# Patient Record
Sex: Female | Born: 1955 | Race: White | Hispanic: No | Marital: Married | Smoking: Former smoker
Health system: Southern US, Community
[De-identification: ages and names within clinical notes are randomized; demographics above are authoritative.]

## PROBLEM LIST (undated history)

## (undated) DIAGNOSIS — L409 Psoriasis, unspecified: Secondary | ICD-10-CM

## (undated) DIAGNOSIS — M069 Rheumatoid arthritis, unspecified: Secondary | ICD-10-CM

## (undated) DIAGNOSIS — F419 Anxiety disorder, unspecified: Secondary | ICD-10-CM

## (undated) DIAGNOSIS — D649 Anemia, unspecified: Secondary | ICD-10-CM

## (undated) DIAGNOSIS — D75839 Thrombocytosis, unspecified: Secondary | ICD-10-CM

## (undated) DIAGNOSIS — R42 Dizziness and giddiness: Secondary | ICD-10-CM

## (undated) DIAGNOSIS — K219 Gastro-esophageal reflux disease without esophagitis: Secondary | ICD-10-CM

## (undated) DIAGNOSIS — K5792 Diverticulitis of intestine, part unspecified, without perforation or abscess without bleeding: Secondary | ICD-10-CM

## (undated) DIAGNOSIS — I499 Cardiac arrhythmia, unspecified: Secondary | ICD-10-CM

## (undated) DIAGNOSIS — I10 Essential (primary) hypertension: Secondary | ICD-10-CM

## (undated) DIAGNOSIS — R112 Nausea with vomiting, unspecified: Secondary | ICD-10-CM

## (undated) DIAGNOSIS — I739 Peripheral vascular disease, unspecified: Secondary | ICD-10-CM

## (undated) DIAGNOSIS — E785 Hyperlipidemia, unspecified: Secondary | ICD-10-CM

## (undated) DIAGNOSIS — Z9889 Other specified postprocedural states: Secondary | ICD-10-CM

## (undated) HISTORY — DX: Hyperlipidemia, unspecified: E78.5

## (undated) HISTORY — DX: Rheumatoid arthritis, unspecified: M06.9

## (undated) HISTORY — DX: Psoriasis, unspecified: L40.9

## (undated) HISTORY — DX: Peripheral vascular disease, unspecified: I73.9

## (undated) HISTORY — PX: HYSTERECTOMY ABDOMINAL WITH SALPINGO-OOPHORECTOMY: SHX6792

## (undated) HISTORY — PX: ANEURYSM COILING: SHX5349

## (undated) HISTORY — PX: INSERTION OF MESH: SHX5868

## (undated) HISTORY — PX: CHOLECYSTECTOMY: SHX55

---

## 2003-07-08 HISTORY — PX: CHOLECYSTECTOMY: SHX55

## 2003-07-08 HISTORY — PX: ILEOSTOMY: SHX1783

## 2007-07-13 DIAGNOSIS — F410 Panic disorder [episodic paroxysmal anxiety] without agoraphobia: Secondary | ICD-10-CM | POA: Insufficient documentation

## 2011-05-20 DIAGNOSIS — Z720 Tobacco use: Secondary | ICD-10-CM | POA: Insufficient documentation

## 2011-05-20 DIAGNOSIS — R002 Palpitations: Secondary | ICD-10-CM | POA: Insufficient documentation

## 2013-11-14 DIAGNOSIS — I739 Peripheral vascular disease, unspecified: Secondary | ICD-10-CM | POA: Insufficient documentation

## 2015-03-07 HISTORY — PX: COLONOSCOPY: SHX174

## 2019-04-21 ENCOUNTER — Encounter: Payer: Self-pay | Admitting: Internal Medicine

## 2019-04-30 NOTE — Progress Notes (Signed)
Referring Provider: Berton Bon Primary Care Physician:  Berton Bon Primary Gastroenterologist:  Dr. Gala Romney  Chief Complaint  Patient presents with  . Nausea    doing better    HPI:   Olivia [name] is a 63 y.o. female presenting today at the request of [name], [place], [place] for nausea/vomiting, RUQ pain, and elevated LFTs.  Reviewed recent ED note. Patient was seen at [facility] ED in [place] on 03/13/2019 for further evaluation of abnormal labs after being seen in urgent care for abdominal pain and vomiting that began acutely after eating bacon wrapped scallops and crab dip.  In the ED, alk phos elevated at 267, AST elevated 605, ALT elevated 765, bilirubin elevated 2.9, lipase normal, kidney function and electrolytes within normal limits, WBC elevated at 14, Hgb normal at 13 with normocytic indices, platelets elevated at 673.  CT abdomen and pelvis with IV contrast with heterogenous hepatic enhancement with geographic fatty infiltration, prominence of distal common bile duct without suspicion of obstruction, prior sphincterotomy cholecystectomy with air in the biliary tract, diverticulosis without diverticulitis, and bilateral nephrolithiasis without obstruction.  No acute findings.  Patient received antiemetics and IV fluids and began to feel better.  On repeat exam she had no RUQ pain.  Suspected possible case of hepatitis A.  She was discharged and advised to follow-up with her PCP.  PCP note from 03/21/2019 for hospital follow-up.  Patient continued with nausea but without vomiting.  Denied abdominal pain, diarrhea, bloody stools, fever or hematemesis.  Repeat labs on 03/22/2019 with alk phos elevated at 189, ALT elevated at 67, AST normal, total bilirubin normal.  Hepatitis C antibody negative.  Hepatitis B surface antigen and core antibody IgM negative.  Hepatitis A antibody IgM negative.  WBC elevated at 12.5.  Platelets elevated at 714.  Kidney function and electrolytes  within normal limits.  Today she states after eating bacon wrapped scallops, she developed acute onset n/v and epigastric/RUQabdominal pain. Reports only one episode of vomiting. No hematemesis. No diarrhea. Thinks she had food poisoning. She followed bland diet for about 1 month. States she is doing well now. Abdominal pain has resolved. No nausea or vomiting. Back to eating normally. Had lost about 7 lbs but has gained 2 lbs since September.    Reports occasional blood in the stools, sometimes on toilet tissue.. Thinks she has internal hemorrhoid.  Denies prolapsing tissue, rectal burning, or itching.  Occurs 2-3 times a year. Last episode 8-9 months ago. Present since she had a baby. Last coloscopy about 5 years ago with Dr. Posey Pronto in [place]. Reports 3 polyps. Says her PCP told her she was due. BM daily. No diarrhea or constipation. No melena.   Takes Dexilant as needed for GERD. Taking Dexilant 2-3 times a month at the most. Spicy and greasy foods will trigger symptoms but she tries to avoid these.  Will take [place] ahead of time if she is going to consume known trigger which controlled symptoms well. Very rarely will she have a burning sensation in her chest. No dysphagia.   No fever, chills, lighthededness, dizziness, or feeling like she will pass out. Denies chest pain, heart palitations, or shortness of breath. Mild SOB with exertion. No cough.   No family history of liver disease. Stopped drinking alcohol about 5 years ago. Used to drink 3-4 beers a week. No history of drug use. Reports friend with hep C. Asking if she can get Hep C from her friend. Explained Hep C is transmitted  through blood. She should avoid exposure to others blood and not share personal equipment such as razors and toothbrushes with others. Reports some yellowing of her skin initially. None since. No swelling in abdomen or legs. No confusion, dark urine, or bruising.    Patient brought letter from PCP stating her stool  studies were negative for bacterial infection, ova, or parasites.   Past Medical History:  Diagnosis Date  . Dyslipidemia   . PAD (peripheral artery disease) ([place])   . Psoriasis   . RA (rheumatoid arthritis) ([place])     Past Surgical History:  Procedure Laterality Date  . CHOLECYSTECTOMY    . COLONOSCOPY    . HYSTERECTOMY ABDOMINAL WITH SALPINGO-OOPHORECTOMY    . ILEOSTOMY  2005   per patient for bowel obstruction and peritonitis with anastomosis  . INSERTION OF MESH     RLQ    Current Outpatient Medications  Medication Sig Dispense Refill  . alendronate (FOSAMAX) 70 MG tablet Take 70 mg by mouth once a week. Take with a full glass of water on an empty stomach.    . chlorpheniramine (CHLOR-TRIMETON) 4 MG tablet Take 4 mg by mouth as needed for allergies.    Marland Kitchen clopidogrel (PLAVIX) 75 MG tablet Take 1 tablet by mouth daily.    . Cyanocobalamin (VITAMIN B-12 IJ) Inject as directed. 1045mg weekly    . Cyanocobalamin (VITAMIN B-12) 3000 MCG SUBL Place under the tongue daily.    .Marland KitchenDexlansoprazole 30 MG capsule Take 30 mg by mouth as needed.    . Ergocalciferol (VITAMIN D2 PO) Take by mouth once a week. 1.237m   . ergocalciferol (VITAMIN D2) 1.25 MG (50000 UT) capsule Take 50,000 Units by mouth once a week.    . Glucosamine HCl 1000 MG TABS Take by mouth daily.    . Marland KitchenVER THE COUNTER MEDICATION Calm Five as needed    . OVER THE COUNTER MEDICATION Hemp Oil Complex daily    . OVER THE COUNTER MEDICATION Lean advantage [id number]    . OVER THE COUNTER MEDICATION Spleen desiccated 330m64maily    . Oyster Shell (OYSTER CALCIUM) 500 MG TABS tablet Take 500 mg of elemental calcium by mouth daily.    . predniSONE (DELTASONE) 10 MG tablet Take 10 mg by mouth daily.    . suMarland KitchenfaSALAzine (AZULFIDINE) 500 MG tablet Take 500 mg by mouth 4 (four) times daily.    . TURMERIC PO Take by mouth daily.     No current facility-administered medications for this visit.     Allergies as of 05/02/2019 -  Review Complete 05/02/2019  Allergen Reaction Noted  . Codeine  05/02/2019    Family History  Problem Relation Age of Onset  . Lung cancer Mother   . Lung cancer Father   . Stomach cancer Brother   . Breast cancer Cousin   . Colon cancer Neg Hx     Social History   Socioeconomic History  . Marital status: Unknown    Spouse name: Not on file  . Number of children: Not on file  . Years of education: Not on file  . Highest education level: Not on file  Occupational History  . Not on file  Social Needs  . Financial resource strain: Not on file  . Food insecurity    Worry: Not on file    Inability: Not on file  . Transportation needs    Medical: Not on file    Non-medical: Not on file  Tobacco Use  .  Smoking status: Former Research scientist (life sciences)  . Smokeless tobacco: Never Used  Substance and Sexual Activity  . Alcohol use: Not Currently    Comment: in the past  . Drug use: Never  . Sexual activity: Not on file  Lifestyle  . Physical activity    Days per week: Not on file    Minutes per session: Not on file  . Stress: Not on file  Relationships  . Social Herbalist on phone: Not on file    Gets together: Not on file    Attends religious service: Not on file    Active member of club or organization: Not on file    Attends meetings of clubs or organizations: Not on file    Relationship status: Not on file  . Intimate partner violence    Fear of current or ex partner: Not on file    Emotionally abused: Not on file    Physically abused: Not on file    Forced sexual activity: Not on file  Other Topics Concern  . Not on file  Social History Narrative  . Not on file    Review of Systems: Gen: See HPI CV: See HPI Resp: See HPI.  GI: See HPI GU : Denies urinary burning, urinary frequency, urinary hesitancy MS: Denies joint pain Derm: Denies rash Psych: Admits to anxiety. States she has panic attacks.  Heme: See HPI  Physical Exam: BP 126/67   Pulse 71   Temp  (!) 95.9 F (35.5 C) (Temporal)   Ht _0  (1.6 m)   Wt 155 lb (70.3 kg)   BMI 27.46 kg/m  General:   Alert and oriented. Pleasant and cooperative. Well-nourished and well-developed.  Head:  Normocephalic and atraumatic. Eyes:  Without icterus, sclera clear and conjunctiva pink.  Ears:  Normal auditory acuity. Nose:  No deformity, discharge,  or lesions. Lungs:  Clear to auscultation bilaterally. No wheezes, rales, or rhonchi. No distress.  Heart:  S1, S2 present without murmurs appreciated.  Abdomen:  +BS, soft, non-tender and non-distended. No HSM noted. No guarding or rebound. No masses appreciated.  Rectal:  Deferred  Msk:  Symmetrical without gross deformities. Normal posture. Extremities:  Without edema. Neurologic:  Alert and  oriented x4;  grossly normal neurologically. Skin:  Intact without significant lesions or rashes. Psych: Normal mood and affect.

## 2019-05-02 ENCOUNTER — Other Ambulatory Visit: Payer: Self-pay

## 2019-05-02 ENCOUNTER — Encounter: Payer: Self-pay | Admitting: Gastroenterology

## 2019-05-02 ENCOUNTER — Ambulatory Visit (INDEPENDENT_AMBULATORY_CARE_PROVIDER_SITE_OTHER): Payer: Medicare Other | Admitting: Gastroenterology

## 2019-05-02 VITALS — BP 126/67 | HR 71 | Temp 95.9°F | Ht 63.0 in | Wt 155.0 lb

## 2019-05-02 DIAGNOSIS — R7989 Other specified abnormal findings of blood chemistry: Secondary | ICD-10-CM | POA: Diagnosis not present

## 2019-05-02 DIAGNOSIS — K625 Hemorrhage of anus and rectum: Secondary | ICD-10-CM

## 2019-05-02 DIAGNOSIS — K760 Fatty (change of) liver, not elsewhere classified: Secondary | ICD-10-CM | POA: Diagnosis not present

## 2019-05-02 NOTE — Assessment & Plan Note (Addendum)
Fatty liver seen on recent CT abdomen and pelvis on 03/13/19 while she was being evaluated for acute onset upper abdominal pain, nausea, and vomiting with elevated LFTs. LFTs are down trending and patient is asymptomatic at this time. Reports an episode of scleral icterus initially, but this has resolved and she is without any signs or symptoms of advanced liver disease. No alcohol in 5 years. Used to drink 4 beers a week. No history of drug use. Hepatitis panel negative on 03/22/19. She does take several OTC supplements.   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  Rechecking CBC and HFP to ensure LFTs continue to normalize.  Instructions for fatty liver: Recommend 1-2# weight loss per week until ideal body weight through exercise & diet. Low fat/cholesterol diet.   Avoid sweets, sodas, fruit juices, sweetened beverages like tea, etc. Gradually increase exercise from 15 min daily up to 1 hr per day 5 days/week. Limit alcohol use. Advised she discontinue OTC supplements.

## 2019-05-02 NOTE — Patient Instructions (Addendum)
Please have labs completed.  We will call you with results and further recommendations.  You do have a fatty liver that was identified on your recent CT scan. Instructions for fatty liver: Recommend 1-2# weight loss per week until ideal body weight through exercise & diet. Low fat/cholesterol diet.   Avoid sweets, sodas, fruit juices, sweetened beverages like tea, etc. Gradually increase exercise from 15 min daily up to 1 hr per day 5 days/week. Limit alcohol use.  I would advise that you discontinue your over-the-counter supplements.  I am requesting your colonoscopy records.  Once I receive the results, I can determine whether you are due for updated colonoscopy at this time.  We will reach out to you to get you scheduled if need be.  Aliene Altes, PA-C [facility]   Fatty Liver Disease Diet, Adult Fatty liver disease is a condition that causes fat to build up in and around the liver. The disease makes it harder for the liver to work the way that it should. Following a healthy diet can help to keep fatty liver disease under control. It can also help to prevent or improve conditions that are associated with the disease, such as heart disease, diabetes, high blood pressure, and abnormal cholesterol levels. Along with regular exercise, this diet:  Promotes weight loss.  Helps to control blood sugar levels.  Helps to improve the way that the body uses insulin. What are tips for following this plan? Reading food labels Always check food labels for:  The amount of saturated fat in a food. You should limit your intake of saturated fat. Saturated fat is found in foods that come from animals, including meat and dairy products such as butter, cheese, and whole milk.  The amount of fiber in a food. You should choose high-fiber foods such as fruits, vegetables, and whole grains. Try to get 25-30 grams (g) of fiber a day.  Cooking  When cooking, use heart-healthy oils that  are high in monounsaturated fats. These include olive oil, canola oil, and avocado oil.  Limit frying or deep-frying foods. Cook foods using healthy methods such as baking, boiling, steaming, and grilling instead. Meal planning  You may want to keep track of how many calories you take in. Eating the right amount of calories will help you achieve a healthy weight. Meeting with a registered dietitian can help you get started.  Limit how often you eat takeout and fast food. These foods are usually very high in fat, salt, and sugar.  Use the glycemic index (GI) to plan your meals. The index tells you how quickly a food will raise your blood sugar. Choose low-GI foods (GI less than 55). These foods take a longer time to raise blood sugar. A registered dietitian can help you identify foods lower on the GI scale. Lifestyle  You may want to follow a Mediterranean diet. This diet includes a lot of vegetables, lean meats or fish, whole grains, fruits, and healthy oils and fats. What foods can I eat?  Fruits Bananas. Apples. Oranges. Grapes. Papaya. Mango. Pomegranate. Kiwi. Grapefruit. Cherries. Vegetables Lettuce. Spinach. Peas. Beets. Cauliflower. Cabbage. Broccoli. Carrots. Tomatoes. Squash. Eggplant. Herbs. Peppers. Onions. Cucumbers. Brussels sprouts. Yams and sweet potatoes. Beans. Lentils. Grains Whole wheat or whole-grain foods, including breads, crackers, cereals, and pasta. Stone-ground whole wheat. Unsweetened oatmeal. Bulgur. Barley. Quinoa. Brown or wild rice. Corn or whole wheat flour tortillas. Meats and other proteins Lean meats. Poultry. Tofu. Seafood and shellfish. Dairy Low-fat or fat-free dairy products,  such as yogurt, cottage cheese, or cheese. Beverages Water. Sugar-free drinks. Tea. Coffee. Low-fat or skim milk. Milk alternatives, such as soy or almond milk. Real fruit juice. Fats and oils Avocado. Canola or olive oil. Nuts and nut butters. Seeds. Seasonings and  condiments Mustard. Relish. Low-fat, low-sugar ketchup and barbecue sauce. Low-fat or fat-free mayonnaise. Sweets and desserts Sugar-free sweets. The items listed above may not be a complete list of foods and beverages you can eat. Contact a dietitian for more information. What foods should I limit or avoid? Meats and other proteins Limit red meat to 1-2 times a week. Dairy [employer]. Fats and oils Palm oil and coconut oil. Fried foods. Other foods Processed foods. Foods that contain a lot of salt or sodium. Sweets and desserts Sweets that contain sugar. Beverages Sweetened drinks, such as sweet tea, milkshakes, iced sweet drinks, and sodas. Alcohol. The items listed above may not be a complete list of foods and beverages you should avoid. Contact a dietitian for more information. Where to find more information The [employer] of Diabetes and Digestive and Kidney Diseases: [employer] Summary  Nonalcoholic fatty liver disease is a condition that causes fat to build up in and around the liver.  Following a healthy diet can help to keep nonalcoholic fatty liver disease under control. Your diet should be rich in fruits, vegetables, whole grains, and lean proteins.  Limit your intake of saturated fat. Saturated fat is found in foods that come from animals, including meat and dairy products such as butter, cheese, and whole milk.  This diet promotes weight loss, helps to control blood sugar levels, and helps to improve the way that the body uses insulin. This information is not intended to replace advice given to you by your health care provider. Make sure you discuss any questions you have with your health care provider. Document Released: 11/07/2014 Document Revised: 10/15/2018 Document Reviewed: 07/15/2018 Elsevier Patient Education  2020 [employer].

## 2019-05-02 NOTE — Assessment & Plan Note (Signed)
Patient reports history of intermittent rectal bleeding since giving birth.  Will have bright red blood in stool and on toilet tissue about 2-3 times a year.  Last episode about 8-9 months ago.  BMs daily without constipation or diarrhea.  Thinks she may have internal hemorrhoids.  Denies prolapsing tissue, rectal burning, or itching.  Last colonoscopy with Dr. Posey Pronto in [place] about 5 years ago.  Per patient she had 3 polyps.  Thinks she may be due for repeat colonoscopy at this time.  We will request TCS records. Further recommendations to follow.

## 2019-05-02 NOTE — Assessment & Plan Note (Addendum)
63 year old female who is s/p cholecystectomy who presents for follow-up of recent acute elevation of LFTs.  Patient presented to [facility] ED on 03/13/2019 with acute onset nausea, vomiting, and upper abdominal pain.  Labs revealed significant elevation of LFTs with alk phos 267, AST 605, and ALT 767, bilirubin elevated at 2.9.  WBC elevated at 14, platelets elevated at 673. CT abdomen and pelvis significant for fatty infiltration of the liver, prominence of distal common bile duct without suspicion of obstruction s/p cholecystectomy and prior sphincterotomy with air in the biliary tract.  She improved with antiemetics and fluids. Repeat labs on 03/22/2019 with significant improvement in LFTs with alk phos 189, ALT 67, AST normal, total bilirubin normal.  Hep C antibody negative, hep B surface antigen and core antibody IgM negative, hepatitis A antibody IgM negative.  WBC slightly elevated 12.5, platelets elevated 714.  Patient brought letter from PCP stating her stool studies were negative for bacterial infection, ova, or parasites. Today patient is asymptomatic.  She has resumed a normal diet and has gained 2 pounds in September.  No significant upper GI symptoms.  Denies ever having diarrhea with acute illness. Reports an episode of scleral icterus initially, but this has resolved and she is without any signs or symptoms of advanced liver disease. Occasional blood in her stools about 2-3 times a year since giving birth.  Last episode 8-9 months ago.  Otherwise, no significant lower GI symptoms.  Last colonoscopy about 5 years ago with Dr. Posey Pronto.  Reports 3 polyps. No family history of liver disease. No alcohol in 5 years. No history of drug use. She does take several OTC supplements.   I suspect patients elevated LFTs were related to acute, although undiagnosed, illness as symptoms occurred acutely after eating seafood and have completely resolved. Seems most consistent with Hep A although IgM was negative. It is  possible she passed a CBD stone, but I am less suspicious of this. I will recheck her CBC and HFP today to ensure her LFTs are continuing to trend down.  Instructions for fatty liver: Recommend 1-2# weight loss per week until ideal body weight through exercise & diet. Low fat/cholesterol diet.   Avoid sweets, sodas, fruit juices, sweetened beverages like tea, etc. Gradually increase exercise from 15 min daily up to 1 hr per day 5 days/week. Limit alcohol use. Fatty liver handout provided.  Advised to d/c OTC supplements.  Request TCS report from Dr. Posey Pronto.

## 2019-05-03 LAB — HEPATIC FUNCTION PANEL
AG Ratio: 1.6 (calc) (ref 1.0–2.5)
ALT: 21 U/L (ref 6–29)
AST: 20 U/L (ref 10–35)
Albumin: 4.1 g/dL (ref 3.6–5.1)
Alkaline phosphatase (APISO): 96 U/L (ref 37–153)
Bilirubin, Direct: 0.1 mg/dL (ref 0.0–0.2)
Globulin: 2.6 g/dL (calc) (ref 1.9–3.7)
Indirect Bilirubin: 0.2 mg/dL (calc) (ref 0.2–1.2)
Total Bilirubin: 0.3 mg/dL (ref 0.2–1.2)
Total Protein: 6.7 g/dL (ref 6.1–8.1)

## 2019-05-03 LAB — CBC WITH DIFFERENTIAL/PLATELET
Absolute Monocytes: 930 cells/uL (ref 200–950)
Basophils Absolute: 66 cells/uL (ref 0–200)
Basophils Relative: 0.4 %
Eosinophils Absolute: 0 cells/uL — ABNORMAL LOW (ref 15–500)
Eosinophils Relative: 0 %
HCT: 39 % (ref 35.0–45.0)
Hemoglobin: 12.7 g/dL (ref 11.7–15.5)
Lymphs Abs: 3303 cells/uL (ref 850–3900)
MCH: 27.7 pg (ref 27.0–33.0)
MCHC: 32.6 g/dL (ref 32.0–36.0)
MCV: 85 fL (ref 80.0–100.0)
MPV: 8.7 fL (ref 7.5–12.5)
Monocytes Relative: 5.6 %
Neutro Abs: 12301 cells/uL — ABNORMAL HIGH (ref 1500–7800)
Neutrophils Relative %: 74.1 %
Platelets: 676 10*3/uL — ABNORMAL HIGH (ref 140–400)
RBC: 4.59 10*6/uL (ref 3.80–5.10)
RDW: 13.4 % (ref 11.0–15.0)
Total Lymphocyte: 19.9 %
WBC: 16.6 10*3/uL — ABNORMAL HIGH (ref 3.8–10.8)

## 2019-05-03 LAB — HEPATITIS A ANTIBODY, TOTAL: Hepatitis A AB,Total: NONREACTIVE

## 2019-05-04 NOTE — Progress Notes (Signed)
LFTs are back within normal limits. Hep A Ab total non-reactive. CBC with elevated WBC and elevated platelets. In review of her chart, there is mention of chronically elevated white count with evaluation in the past at [facility]. I am not able to see the workup. This was mentioned in a cardiology note. Has she been worked up for elevated white count in the past? Any fever, chills, cough, shortness of breath, abdominal pain, or urinary symptoms?

## 2019-05-19 ENCOUNTER — Encounter: Payer: Self-pay | Admitting: Gastroenterology

## 2019-05-29 ENCOUNTER — Telehealth: Payer: Self-pay | Admitting: Gastroenterology

## 2019-05-29 NOTE — Telephone Encounter (Signed)
Received patient's colonoscopy report from [facility] gastroenterology with Dr. Posey Pronto dated 03/07/2015.  Propofol was used for sedation.  Findings included 3 6 mm rectosigmoid polyps, moderate left-sided diverticulosis, and tortuous rectosigmoid colon.  Pathology with inflamed hyperplastic polyps.  Discussed case with Dr. Gala Romney. In light of intermittent rectal bleeding, he recommended we go ahead and update colonoscopy at this time as she is now seeing [place] and last colonoscopy was performed at an outside facility.  Although rectal bleeding may be from a benign source such as hemorrhoids, would not want to potentially miss something significant.   Elmo Putt, please let patient know we would like to go ahead and schedule her for colonoscopy. We can follow-up with her in the office after her procedure.   [facility] Clinical pool: Pending patients agreement to move forward, we need to schedule patient for TCS with propofol with Dr. Gala Romney. Dx. Rectal bleeding.

## 2019-05-30 ENCOUNTER — Encounter: Payer: Self-pay | Admitting: *Deleted

## 2019-05-30 ENCOUNTER — Telehealth: Payer: Self-pay | Admitting: Internal Medicine

## 2019-05-30 ENCOUNTER — Other Ambulatory Visit: Payer: Self-pay

## 2019-05-30 DIAGNOSIS — K625 Hemorrhage of anus and rectum: Secondary | ICD-10-CM

## 2019-05-30 MED ORDER — PEG 3350-KCL-NA BICARB-NACL 420 G PO SOLR: 4000.0000 mL | ORAL | 0 refills | Status: DC

## 2019-05-30 NOTE — Telephone Encounter (Signed)
Called pt, TCS w/RMR w/Propofol scheduled for 08/18/19 at 2:15pm. Rx for prep sent to pharmacy. Orders entered.

## 2019-05-30 NOTE — Telephone Encounter (Signed)
Pt notified that her records were reviewed by [facility] and is ready to schedule her procedure.

## 2019-05-30 NOTE — Telephone Encounter (Signed)
Pt called to see if we ever received records from [facility], I told her yes, they were with [facility]. She is asking to go ahead and schedule her colonoscopy. [phone]

## 2019-05-31 NOTE — Telephone Encounter (Signed)
Pre-op and COVID test 08/16/19. Appt letter mailed with procedure instructions.

## 2019-07-20 ENCOUNTER — Telehealth: Payer: Self-pay | Admitting: Internal Medicine

## 2019-07-20 NOTE — Telephone Encounter (Signed)
Pt is scheduled colonoscopy on 08/18/2019. She is having pain and discomfort in her liver and was asking if she should have her enzymes checked. Please advise and call her at [phone]

## 2019-07-20 NOTE — Telephone Encounter (Signed)
Spoke with pt. Pt started to feel some swelling and tightness around her upper abdomen 2 days ago. Pt states she measured her upper abdomen and it's 42 inches. Sharp pain comes and goes and only last for seconds. Pt states the pain wasn't severe. Mild nausea has been present. Pt is wondering if her liver enzymes were elevated and if she should have her blood checked.

## 2019-07-21 NOTE — Telephone Encounter (Signed)
Noted  

## 2019-07-21 NOTE — Telephone Encounter (Signed)
Spoke with pt. Pt was notified of Kh's recommendations. Pt isn't taking Dexilant at all and is aware that per [facility], she can start taking it daily to see if it helps symptoms. Pt was asked to schedule an apt for further evaluation or proceed to the ED if her symptoms worsen. Pt declined apt and says she is going to call her PCP since she lives in [place]. If she isn't able to schedule with her PCP, she will call our office back.

## 2019-07-21 NOTE — Telephone Encounter (Signed)
I would not expect her LFTs to be elevated. LFTs were elevated in September 2020 related to an acute illness after eating seafood. They had returned to normal in October 2020. I would not recheck LFTs based off of these symptoms alone. How are her reflux symptoms? Is she still taking Dexilant? At last visit, she was taking this as needed. She can try taking this daily to see if this helps. Otherwise, if her symptoms persist, she would need to be seen in the office for further evaluation. If her symptoms acutely worsen/become severe, she should proceed to the ED.

## 2019-08-15 ENCOUNTER — Telehealth: Payer: Self-pay | Admitting: Internal Medicine

## 2019-08-15 ENCOUNTER — Other Ambulatory Visit: Payer: Self-pay

## 2019-08-15 ENCOUNTER — Encounter (HOSPITAL_COMMUNITY): Payer: Self-pay

## 2019-08-15 NOTE — Telephone Encounter (Signed)
Pt has multiple questions about her prep and the otc stool softener. Please call her at [phone] She is scheduled for this Thursday.

## 2019-08-15 NOTE — Telephone Encounter (Signed)
Called pt, answered questions.

## 2019-08-16 ENCOUNTER — Other Ambulatory Visit (HOSPITAL_COMMUNITY)
Admission: RE | Admit: 2019-08-16 | Discharge: 2019-08-16 | Disposition: A | Discharge status: Home / Self Care | Payer: Medicare Other | Source: Ambulatory Visit | Attending: Internal Medicine | Admitting: Internal Medicine

## 2019-08-16 ENCOUNTER — Encounter (HOSPITAL_COMMUNITY)
Admission: RE | Admit: 2019-08-16 | Discharge: 2019-08-16 | Disposition: A | Discharge status: Home / Self Care | Payer: Medicare Other | Source: Ambulatory Visit | Attending: Internal Medicine | Admitting: Internal Medicine

## 2019-08-16 DIAGNOSIS — Z01812 Encounter for preprocedural laboratory examination: Secondary | ICD-10-CM | POA: Insufficient documentation

## 2019-08-16 DIAGNOSIS — Z20822 Contact with and (suspected) exposure to covid-19: Secondary | ICD-10-CM | POA: Diagnosis not present

## 2019-08-16 HISTORY — DX: Dizziness and giddiness: R42

## 2019-08-16 HISTORY — DX: Other specified postprocedural states: Z98.890

## 2019-08-16 HISTORY — DX: Nausea with vomiting, unspecified: R11.2

## 2019-08-16 LAB — SARS CORONAVIRUS 2 (TAT 6-24 HRS): SARS Coronavirus 2: NEGATIVE

## 2019-08-17 ENCOUNTER — Telehealth: Payer: Self-pay

## 2019-08-17 NOTE — Telephone Encounter (Signed)
Tried to call pt to see if she can arrive earlier tomorrow for TCS, no answer, LMOVM for return call.

## 2019-08-17 NOTE — Telephone Encounter (Signed)
Patient called office, she doesn't want to have TCS any sooner tomorrow. She wants to keep time as scheduled.

## 2019-08-18 ENCOUNTER — Other Ambulatory Visit: Payer: Self-pay

## 2019-08-18 ENCOUNTER — Ambulatory Visit (HOSPITAL_COMMUNITY): Payer: Medicare Other | Admitting: Anesthesiology

## 2019-08-18 ENCOUNTER — Encounter (HOSPITAL_COMMUNITY): Payer: Self-pay | Admitting: Internal Medicine

## 2019-08-18 ENCOUNTER — Ambulatory Visit (HOSPITAL_COMMUNITY)
Admission: RE | Admit: 2019-08-18 | Discharge: 2019-08-18 | Disposition: A | Discharge status: Home / Self Care | Payer: Medicare Other | Attending: Internal Medicine | Admitting: Internal Medicine

## 2019-08-18 ENCOUNTER — Encounter (HOSPITAL_COMMUNITY)
Admission: RE | Disposition: A | Discharge status: Home / Self Care | Payer: Self-pay | Source: Home / Self Care | Attending: Internal Medicine

## 2019-08-18 DIAGNOSIS — Z87891 Personal history of nicotine dependence: Secondary | ICD-10-CM | POA: Diagnosis not present

## 2019-08-18 DIAGNOSIS — Z7983 Long term (current) use of bisphosphonates: Secondary | ICD-10-CM | POA: Insufficient documentation

## 2019-08-18 DIAGNOSIS — K640 First degree hemorrhoids: Secondary | ICD-10-CM | POA: Diagnosis not present

## 2019-08-18 DIAGNOSIS — Z79899 Other long term (current) drug therapy: Secondary | ICD-10-CM | POA: Diagnosis not present

## 2019-08-18 DIAGNOSIS — K5731 Diverticulosis of large intestine without perforation or abscess with bleeding: Secondary | ICD-10-CM | POA: Diagnosis not present

## 2019-08-18 DIAGNOSIS — Z7902 Long term (current) use of antithrombotics/antiplatelets: Secondary | ICD-10-CM | POA: Diagnosis not present

## 2019-08-18 DIAGNOSIS — I739 Peripheral vascular disease, unspecified: Secondary | ICD-10-CM | POA: Insufficient documentation

## 2019-08-18 DIAGNOSIS — E785 Hyperlipidemia, unspecified: Secondary | ICD-10-CM | POA: Insufficient documentation

## 2019-08-18 DIAGNOSIS — K219 Gastro-esophageal reflux disease without esophagitis: Secondary | ICD-10-CM | POA: Diagnosis not present

## 2019-08-18 DIAGNOSIS — K921 Melena: Secondary | ICD-10-CM | POA: Diagnosis present

## 2019-08-18 DIAGNOSIS — K621 Rectal polyp: Secondary | ICD-10-CM | POA: Diagnosis not present

## 2019-08-18 DIAGNOSIS — D125 Benign neoplasm of sigmoid colon: Secondary | ICD-10-CM | POA: Diagnosis not present

## 2019-08-18 DIAGNOSIS — K625 Hemorrhage of anus and rectum: Secondary | ICD-10-CM

## 2019-08-18 DIAGNOSIS — K635 Polyp of colon: Secondary | ICD-10-CM

## 2019-08-18 HISTORY — PX: COLONOSCOPY WITH PROPOFOL: SHX5780

## 2019-08-18 HISTORY — PX: POLYPECTOMY: SHX5525

## 2019-08-18 SURGERY — COLONOSCOPY WITH PROPOFOL
Anesthesia: General

## 2019-08-18 MED ORDER — STERILE WATER FOR IRRIGATION IR SOLN
Status: DC | PRN
  Administered 2019-08-18: 1.5 mL

## 2019-08-18 MED ORDER — CHLORHEXIDINE GLUCONATE CLOTH 2 % EX PADS: 6.0000 | MEDICATED_PAD | Freq: Once | CUTANEOUS | Status: DC

## 2019-08-18 MED ORDER — MIDAZOLAM HCL 2 MG/2ML IJ SOLN
INTRAMUSCULAR | Status: AC
  Filled 2019-08-18: qty 2

## 2019-08-18 MED ORDER — PROPOFOL 10 MG/ML IV BOLUS
INTRAVENOUS | Status: DC | PRN
  Administered 2019-08-18 (×2): 20 mg via INTRAVENOUS
  Administered 2019-08-18: 40 mg via INTRAVENOUS
  Administered 2019-08-18 (×2): 20 mg via INTRAVENOUS

## 2019-08-18 MED ORDER — LACTATED RINGERS IV SOLN
Freq: Once | INTRAVENOUS | Status: AC
  Administered 2019-08-18: 13:00:00 1000 mL via INTRAVENOUS

## 2019-08-18 MED ORDER — PROPOFOL 500 MG/50ML IV EMUL
INTRAVENOUS | Status: DC | PRN
  Administered 2019-08-18: 150 ug/kg/min via INTRAVENOUS

## 2019-08-18 NOTE — Anesthesia Postprocedure Evaluation (Signed)
Anesthesia Post Note  Patient: Olivia [name]  Procedure(s) Performed: COLONOSCOPY WITH PROPOFOL (N/A ) POLYPECTOMY  Patient location during evaluation: PACU Anesthesia Type: General Level of consciousness: awake and alert and oriented Pain management: pain level controlled Vital Signs Assessment: post-procedure vital signs reviewed and stable Respiratory status: spontaneous breathing Cardiovascular status: blood pressure returned to baseline and stable Postop Assessment: no apparent nausea or vomiting Anesthetic complications: no     Last Vitals:  Vitals:   08/18/19 1231 08/18/19 1350  BP: 137/70 (!) 106/55  Pulse: 86 85  Resp: 18 17  Temp: 36.8 C 36.8 C  SpO2: 98% 97%    Last Pain:  Vitals:   08/18/19 1350  TempSrc:   PainSc: 0-No pain                 Shiah Berhow

## 2019-08-18 NOTE — Op Note (Signed)
[facility] Patient Name: Olivia [name] Procedure Date: 08/18/2019 12:42 PM MRN: [id number] Date of Birth: October 26, 1955 Attending MD: Norvel Richards , MD CSN: [id number] Age: 64 Admit Type: Outpatient Procedure:                Colonoscopy Indications:              Hematochezia Providers:                Norvel Richards, MD, Charlsie Quest. Theda Sers RN, RN,                            Aram Candela Referring MD:              Medicines:                Propofol per Anesthesia Complications:            No immediate complications. Estimated Blood Loss:     Estimated blood loss was minimal. Procedure:                Pre-Anesthesia Assessment:                           - Prior to the procedure, a History and Physical                            was performed, and patient medications and                            allergies were reviewed. The patient's tolerance of                            previous anesthesia was also reviewed. The risks                            and benefits of the procedure and the sedation                            options and risks were discussed with the patient.                            All questions were answered, and informed consent                            was obtained. Prior Anticoagulants: The patient has                            taken no previous anticoagulant or antiplatelet                            agents. ASA Grade Assessment: II - A patient with                            mild systemic disease. After reviewing the risks  and benefits, the patient was deemed in                            satisfactory condition to undergo the procedure.                           After obtaining informed consent, the colonoscope                            was passed under direct vision. Throughout the                            procedure, the patient's blood pressure, pulse, and                            oxygen saturations were monitored  continuously. The                            [id number] scope was introduced through                            the anus and advanced to the the cecum, identified                            by appendiceal orifice and ileocecal valve. The                            colonoscopy was performed without difficulty. The                            patient tolerated the procedure well. The quality                            of the bowel preparation was adequate. The                            ileocecal valve, appendiceal orifice, and rectum                            were photographed. Scope In: 1:21:13 PM Scope Out: 1:44:20 PM Scope Withdrawal Time: 0 hours 18 minutes 0 seconds  Total Procedure Duration: 0 hours 23 minutes 7 seconds  Findings:      The perianal and digital rectal examinations were normal.      Non-bleeding internal hemorrhoids were found during retroflexion. The       hemorrhoids were moderate, medium-sized and Grade I (internal       hemorrhoids that do not prolapse).      Multiple small and large-mouthed diverticula were found in the entire       colon.      Two sessile polyps were found in the rectum and sigmoid colon. The       polyps were 4 to 7 mm in size. These polyps were removed with a cold       snare. Resection and retrieval were complete. Estimated blood loss was  minimal. I placed a single hemostasis clip larger sigmoid polyp to       ensure polypectomy site sealing      The exam was otherwise without abnormality on direct and retroflexion       views. Impression:               - Non-bleeding internal hemorrhoids.                           - Diverticulosis in the entire examined colon.                           - Two 4 to 7 mm polyps in the rectum and in the                            sigmoid colon, removed with a cold snare. Resected                            and retrieved. Clip placed x1                           - The examination was otherwise  normal on direct                            and retroflexion views. I suspect bleeding                            secondary to hemorrhoids. Moderate Sedation:      Moderate (conscious) sedation was personally administered by an       anesthesia professional. The following parameters were monitored: oxygen       saturation, heart rate, blood pressure, respiratory rate, EKG, adequacy       of pulmonary ventilation, and response to care. Recommendation:           - Patient has a contact number available for                            emergencies. The signs and symptoms of potential                            delayed complications were discussed with the                            patient. Return to normal activities tomorrow.                            Written discharge instructions were provided to the                            patient.                           - Resume previous diet.                           - Continue present medications. Begin Benefiber as  directed follow-up on pathology.                           - Repeat colonoscopy date to be determined after                            pending pathology results are reviewed for                            surveillance based on pathology results.                           - Return to GI office in 6 weeks. Procedure Code(s):        --- Professional ---                           [id number], Colonoscopy, flexible; with removal of                            tumor(s), polyp(s), or other lesion(s) by snare                            technique Diagnosis Code(s):        --- Professional ---                           K64.0, First degree hemorrhoids                           K62.1, Rectal polyp                           K63.5, Polyp of colon                           K92.1, Melena (includes Hematochezia)                           K57.30, Diverticulosis of large intestine without                            perforation  or abscess without bleeding CPT copyright 2019 [employer]. All rights reserved. The codes documented in this report are preliminary and upon coder review may  be revised to meet current compliance requirements. Cristopher Estimable. Juley Giovanetti, MD Norvel Richards, MD 08/18/2019 2:00:20 PM This report has been signed electronically. Number of Addenda: 0

## 2019-08-18 NOTE — Anesthesia Preprocedure Evaluation (Signed)
Anesthesia Evaluation  Patient identified by MRN, date of birth, ID band Patient awake    Reviewed: Allergy & Precautions, NPO status , Patient's Chart, lab work & pertinent test results  History of Anesthesia Complications (+) PONV and history of anesthetic complications  Airway Mallampati: II  TM Distance: >3 FB Neck ROM: Full    Dental   Crown:   Pulmonary neg pulmonary ROS, former smoker,    Pulmonary exam normal breath sounds clear to auscultation       Cardiovascular Exercise Tolerance: Good + Peripheral Vascular Disease  Normal cardiovascular exam Rhythm:Regular Rate:Normal     Neuro/Psych negative neurological ROS  negative psych ROS   GI/Hepatic Neg liver ROS, GERD  Medicated,  Endo/Other  negative endocrine ROS  Renal/GU negative Renal ROS     Musculoskeletal  (+) Arthritis , Osteoarthritis,    Abdominal   Peds negative pediatric ROS (+)  Hematology negative hematology ROS (+)   Anesthesia Other Findings   Reproductive/Obstetrics                             Anesthesia Physical Anesthesia Plan  ASA: II  Anesthesia Plan: General   Post-op Pain Management:    Induction: Intravenous  PONV Risk Score and Plan: TIVA  Airway Management Planned: Nasal Cannula and Natural Airway  Additional Equipment:   Intra-op Plan:   Post-operative Plan:   Informed Consent: I have reviewed the patients History and Physical, chart, labs and discussed the procedure including the risks, benefits and alternatives for the proposed anesthesia with the patient or authorized representative who has indicated his/her understanding and acceptance.     Dental advisory given  Plan Discussed with: CRNA and Surgeon  Anesthesia Plan Comments:         Anesthesia Quick Evaluation

## 2019-08-18 NOTE — Discharge Instructions (Signed)
Diverticulosis  Diverticulosis is a condition that develops when small pouches (diverticula) form in the wall of the large intestine (colon). The colon is where water is absorbed and stool (feces) is formed. The pouches form when the inside layer of the colon pushes through weak spots in the outer layers of the colon. You may have a few pouches or many of them. The pouches usually do not cause problems unless they become inflamed or infected. When this happens, the condition is called diverticulitis. What are the causes? The cause of this condition is not known. What increases the risk? The following factors may make you more likely to develop this condition:  Being older than age 18. Your risk for this condition increases with age. Diverticulosis is rare among people younger than age 47. By age 73, many people have it.  Eating a low-fiber diet.  Having frequent constipation.  Being overweight.  Not getting enough exercise.  Smoking.  Taking over-the-counter pain medicines, like aspirin and ibuprofen.  Having a family history of diverticulosis. What are the signs or symptoms? In most people, there are no symptoms of this condition. If you do have symptoms, they may include:  Bloating.  Cramps in the abdomen.  Constipation or diarrhea.  Pain in the lower left side of the abdomen. How is this diagnosed? Because diverticulosis usually has no symptoms, it is most often diagnosed during an exam for other colon problems. The condition may be diagnosed by:  Using a flexible scope to examine the colon (colonoscopy).  Taking an X-ray of the colon after dye has been put into the colon (barium enema).  Having a CT scan. How is this treated? You may not need treatment for this condition. Your health care provider may recommend treatment to prevent problems. You may need treatment if you have symptoms or if you previously had diverticulitis. Treatment may include:  Eating a high-fiber  diet.  Taking a fiber supplement.  Taking a live bacteria supplement (probiotic).  Taking medicine to relax your colon. Follow these instructions at home: Medicines  Take over-the-counter and prescription medicines only as told by your health care provider.  If told by your health care provider, take a fiber supplement or probiotic. Constipation prevention Your condition may cause constipation. To prevent or treat constipation, you may need to:  Drink enough fluid to keep your urine pale yellow.  Take over-the-counter or prescription medicines.  Eat foods that are high in fiber, such as beans, whole grains, and fresh fruits and vegetables.  Limit foods that are high in fat and processed sugars, such as fried or sweet foods.  General instructions  Try not to strain when you have a bowel movement.  Keep all follow-up visits as told by your health care provider. This is important. Contact a health care provider if you:  Have pain in your abdomen.  Have bloating.  Have cramps.  Have not had a bowel movement in 3 days. Get help right away if:  Your pain gets worse.  Your bloating becomes very bad.  You have a fever or chills, and your symptoms suddenly get worse.  You vomit.  You have bowel movements that are bloody or black.  You have bleeding from your rectum. Summary  Diverticulosis is a condition that develops when small pouches (diverticula) form in the wall of the large intestine (colon).  You may have a few pouches or many of them.  This condition is most often diagnosed during an exam for other colon  problems.  Treatment may include increasing the fiber in your diet, taking supplements, or taking medicines. This information is not intended to replace advice given to you by your health care provider. Make sure you discuss any questions you have with your health care provider. Document Revised: 01/20/2019 Document Reviewed: 01/20/2019 Elsevier Patient  Education  [place].  Hemorrhoids Hemorrhoids are swollen veins that may develop:  In the butt (rectum). These are called internal hemorrhoids.  Around the opening of the butt (anus). These are called external hemorrhoids. Hemorrhoids can cause pain, itching, or bleeding. Most of the time, they do not cause serious problems. They usually get better with diet changes, lifestyle changes, and other home treatments. What are the causes? This condition may be caused by:  Having trouble pooping (constipation).  Pushing hard (straining) to poop.  Watery poop (diarrhea).  Pregnancy.  Being very overweight (obese).  Sitting for long periods of time.  Heavy lifting or other activity that causes you to strain.  Anal sex.  Riding a bike for a long period of time. What are the signs or symptoms? Symptoms of this condition include:  Pain.  Itching or soreness in the butt.  Bleeding from the butt.  Leaking poop.  Swelling in the area.  One or more lumps around the opening of your butt. How is this diagnosed? A doctor can often diagnose this condition by looking at the affected area. The doctor may also:  Do an exam that involves feeling the area with a gloved hand (digital rectal exam).  Examine the area inside your butt using a small tube (anoscope).  Order blood tests. This may be done if you have lost a lot of blood.  Have you get a test that involves looking inside the colon using a flexible tube with a camera on the end (sigmoidoscopy or colonoscopy). How is this treated? This condition can usually be treated at home. Your doctor may tell you to change what you eat, make lifestyle changes, or try home treatments. If these do not help, procedures can be done to remove the hemorrhoids or make them smaller. These may involve:  Placing rubber bands at the base of the hemorrhoids to cut off their blood supply.  Injecting medicine into the hemorrhoids to shrink  them.  Shining a type of light energy onto the hemorrhoids to cause them to fall off.  Doing surgery to remove the hemorrhoids or cut off their blood supply. Follow these instructions at home: Eating and drinking   Eat foods that have a lot of fiber in them. These include whole grains, beans, nuts, fruits, and vegetables.  Ask your doctor about taking products that have added fiber (fibersupplements).  Reduce the amount of fat in your diet. You can do this by: ? Eating low-fat dairy products. ? Eating less red meat. ? Avoiding processed foods.  Drink enough fluid to keep your pee (urine) pale yellow. Managing pain and swelling   Take a warm-water bath (sitz bath) for 20 minutes to ease pain. Do this 3-4 times a day. You may do this in a bathtub or using a portable sitz bath that fits over the toilet.  If told, put ice on the painful area. It may be helpful to use ice between your warm baths. ? Put ice in a plastic bag. ? Place a towel between your skin and the bag. ? Leave the ice on for 20 minutes, 2-3 times a day. General instructions  Take over-the-counter and prescription  medicines only as told by your doctor. ? Medicated creams and medicines may be used as told.  Exercise often. Ask your doctor how much and what kind of exercise is best for you.  Go to the bathroom when you have the urge to poop. Do not wait.  Avoid pushing too hard when you poop.  Keep your butt dry and clean. Use wet toilet paper or moist towelettes after pooping.  Do not sit on the toilet for a long time.  Keep all follow-up visits as told by your doctor. This is important. Contact a doctor if you:  Have pain and swelling that do not get better with treatment or medicine.  Have trouble pooping.  Cannot poop.  Have pain or swelling outside the area of the hemorrhoids. Get help right away if you have:  Bleeding that will not stop. Summary  Hemorrhoids are swollen veins in the butt or  around the opening of the butt.  They can cause pain, itching, or bleeding.  Eat foods that have a lot of fiber in them. These include whole grains, beans, nuts, fruits, and vegetables.  Take a warm-water bath (sitz bath) for 20 minutes to ease pain. Do this 3-4 times a day. This information is not intended to replace advice given to you by your health care provider. Make sure you discuss any questions you have with your health care provider. Document Revised: 07/01/2018 Document Reviewed: 11/12/2017 Elsevier Patient Education  [place].  Nonsurgical Procedures for Hemorrhoids  Nonsurgical procedures can be used to treat hemorrhoids. Hemorrhoids are swollen veins that are inside the rectum (internal hemorrhoids) or around the anus (external hemorrhoids). They are caused by increased pressure in the anal area. This pressure may result from straining to have a bowel movement (constipation), diarrhea, pregnancy, obesity, anal sex, or sitting for long periods of time. Hemorrhoids can cause symptoms such as pain and bleeding. Various procedures may be done if diet changes, lifestyle changes, and other home treatments do not help your symptoms. Some of these procedures do not involve surgery. Tell a health care provider about:  Any allergies you have.  All medicines you are taking, including vitamins, herbs, eye drops, creams, and over-the-counter medicines.  Any problems you or family members have had with anesthetic medicines.  Any blood disorders you have.  Any surgeries you have had.  Any medical conditions you have.  Whether you are pregnant or may be pregnant. What are the risks? Generally, this is a safe procedure. However, problems may occur, including:  Infection.  Bleeding.  Pain. What happens before the procedure?  Ask your health care provider about: ? Changing or stopping your regular medicines. This is especially important if you are taking diabetes  medicines or blood thinners. ? Taking medicines such as aspirin and ibuprofen. These medicines can thin your blood. Do not take these medicines unless your health care provider tells you to take them. ? Taking over-the-counter medicines, vitamins, herbs, and supplements.  Follow instructions from your health care provider about eating or drinking restrictions.  You may need to have a procedure to examine the inside of your colon with a scope (colonoscopy). Your health care provider may do this to make sure that there are no other causes for your bleeding or pain. What happens during the procedure?   Your health care provider will clean your rectal area with a rinsing solution.  A lubricating jelly may be placed into your rectum. The jelly may contain a medicine to  numb the area (local anesthetic).  Your health care provider will insert a short scope (anoscope) into your rectum to examine the hemorrhoids.  One of the following techniques will be used: ? Rubber band ligation. Your health care provider will place medical instruments through the scope to put rubber bands around the base of your hemorrhoids. The bands will cut off the blood supply to the hemorrhoids. The hemorrhoids will fall off after several days. ? Sclerotherapy. Your health care provider will inject medicine through the scope into your hemorrhoids. This will cause them to shrink and dry up. ? Infrared coagulation. Your health care provider will shine a type of light through the scope onto your hemorrhoids. This light will generate energy (infrared radiation). It will cause the hemorrhoids to scar and then fall off. Each of these procedures may vary among health care providers and hospitals. What happens after the procedure?  You will be monitored to make sure that you have no bleeding.  Return to your normal activities as told by your health care provider. Summary  Hemorrhoids are swollen veins that are inside the rectum  (internal hemorrhoids) or around the anus (external hemorrhoids).  Nonsurgical procedures can be used to treat hemorrhoids.  Rubber band ligation, sclerotherapy, or infrared coagulation may be used if dietary and lifestyle changes do not cause your hemorrhoids to go away.  Before the procedure, ask your health care provider about changing or stopping your regular medicines. This information is not intended to replace advice given to you by your health care provider. Make sure you discuss any questions you have with your health care provider. Document Revised: 12/01/2018 Document Reviewed: 12/14/2017 Elsevier Patient Education  [place].  Colon Polyps  Polyps are tissue growths inside the body. Polyps can grow in many places, including the large intestine (colon). A polyp may be a round bump or a mushroom-shaped growth. You could have one polyp or several. Most colon polyps are noncancerous (benign). However, some colon polyps can become cancerous over time. Finding and removing the polyps early can help prevent this. What are the causes? The exact cause of colon polyps is not known. What increases the risk? You are more likely to develop this condition if you:  Have a family history of colon cancer or colon polyps.  Are older than 70 or older than 45 if you are African American.  Have inflammatory bowel disease, such as ulcerative colitis or Crohn's disease.  Have certain hereditary conditions, such as: ? Familial adenomatous polyposis. ? Lynch syndrome. ? Turcot syndrome. ? Peutz-Jeghers syndrome.  Are overweight.  Smoke cigarettes.  Do not get enough exercise.  Drink too much alcohol.  Eat a diet that is high in fat and red meat and low in fiber.  Had childhood cancer that was treated with abdominal radiation. What are the signs or symptoms? Most polyps do not cause symptoms. If you have symptoms, they may include:  Blood coming from your rectum when having  a bowel movement.  Blood in your stool. The stool may look dark red or black.  Abdominal pain.  A change in bowel habits, such as constipation or diarrhea. How is this diagnosed? This condition is diagnosed with a colonoscopy. This is a procedure in which a lighted, flexible scope is inserted into the anus and then passed into the colon to examine the area. Polyps are sometimes found when a colonoscopy is done as part of routine cancer screening tests. How is this treated? Treatment for this  condition involves removing any polyps that are found. Most polyps can be removed during a colonoscopy. Those polyps will then be tested for cancer. Additional treatment may be needed depending on the results of testing. Follow these instructions at home: Lifestyle  Maintain a healthy weight, or lose weight if recommended by your health care provider.  Exercise every day or as told by your health care provider.  Do not use any products that contain nicotine or tobacco, such as cigarettes and e-cigarettes. If you need help quitting, ask your health care provider.  If you drink alcohol, limit how much you have: ? 0-1 drink a day for women. ? 0-2 drinks a day for men.  Be aware of how much alcohol is in your drink. In the [place] one drink equals one 12 oz bottle of beer (355 mL), one 5 oz glass of wine (148 mL), or one 1 oz shot of hard liquor (44 mL). Eating and drinking   Eat foods that are high in fiber, such as fruits, vegetables, and whole grains.  Eat foods that are high in calcium and vitamin D, such as milk, cheese, yogurt, eggs, liver, fish, and broccoli.  Limit foods that are high in fat, such as fried foods and desserts.  Limit the amount of red meat and processed meat you eat, such as hot dogs, sausage, bacon, and lunch meats. General instructions  Keep all follow-up visits as told by your health care provider. This is important. ? This includes having regularly scheduled  colonoscopies. ? Talk to your health care provider about when you need a colonoscopy. Contact a health care provider if:  You have new or worsening bleeding during a bowel movement.  You have new or increased blood in your stool.  You have a change in bowel habits.  You lose weight for no known reason. Summary  Polyps are tissue growths inside the body. Polyps can grow in many places, including the colon.  Most colon polyps are noncancerous (benign), but some can become cancerous over time.  This condition is diagnosed with a colonoscopy.  Treatment for this condition involves removing any polyps that are found. Most polyps can be removed during a colonoscopy. This information is not intended to replace advice given to you by your health care provider. Make sure you discuss any questions you have with your health care provider. Document Revised: 10/08/2017 Document Reviewed: 10/08/2017 Elsevier Patient Education  [place].     Colonoscopy Discharge Instructions  Read the instructions outlined below and refer to this sheet in the next few weeks. These discharge instructions provide you with general information on caring for yourself after you leave the hospital. Your doctor may also give you specific instructions. While your treatment has been planned according to the most current medical practices available, unavoidable complications occasionally occur. If you have any problems or questions after discharge, call Dr. Gala Romney at [phone]. ACTIVITY  You may resume your regular activity, but move at a slower pace for the next 24 hours.   Take frequent rest periods for the next 24 hours.   Walking will help get rid of the air and reduce the bloated feeling in your belly (abdomen).   No driving for 24 hours (because of the medicine (anesthesia) used during the test).    Do not sign any important legal documents or operate any machinery for 24 hours (because of the  anesthesia used during the test).  NUTRITION  Drink plenty of fluids.   You  may resume your normal diet as instructed by your doctor.   Begin with a light meal and progress to your normal diet. Heavy or fried foods are harder to digest and may make you feel sick to your stomach (nauseated).   Avoid alcoholic beverages for 24 hours or as instructed.  MEDICATIONS  You may resume your normal medications unless your doctor tells you otherwise.  WHAT YOU CAN EXPECT TODAY  Some feelings of bloating in the abdomen.   Passage of more gas than usual.   Spotting of blood in your stool or on the toilet paper.  IF YOU HAD POLYPS REMOVED DURING THE COLONOSCOPY:  No aspirin products for 7 days or as instructed.   No alcohol for 7 days or as instructed.   Eat a soft diet for the next 24 hours.  FINDING OUT THE RESULTS OF YOUR TEST Not all test results are available during your visit. If your test results are not back during the visit, make an appointment with your caregiver to find out the results. Do not assume everything is normal if you have not heard from your caregiver or the medical facility. It is important for you to follow up on all of your test results.  SEEK IMMEDIATE MEDICAL ATTENTION IF:  You have more than a spotting of blood in your stool.   Your belly is swollen (abdominal distention).   You are nauseated or vomiting.   You have a temperature over 101.   You have abdominal pain or discomfort that is severe or gets worse throughout the day.    Colon polyp and diverticulosis information provided  Hemorrhoid information provided  Hemorrhoid banding pamphlet provided  No clip until MRI until clip gone  Recommend a daily fiber in the way of Benefiber 1 tablespoon daily for 3 weeks; then increase to 2 tablespoons thereafter  Further recommendations to follow pending review of pathology report  You may pass a small amount of blood with your next bowel movement or 2 but  it will go away.  Office visit with [place] in 6 weeks  At patient request, I called [facility] at [phone] and reviewed results

## 2019-08-18 NOTE — Transfer of Care (Signed)
Immediate Anesthesia Transfer of Care Note  Patient: Olivia [name]  Procedure(s) Performed: COLONOSCOPY WITH PROPOFOL (N/A ) POLYPECTOMY  Patient Location: PACU  Anesthesia Type:General  Level of Consciousness: awake  Airway & Oxygen Therapy: Patient Spontanous Breathing  Post-op Assessment: Report given to RN  Post vital signs: Reviewed  Last Vitals:  Vitals Value Taken Time  BP 106/55 08/18/19 1350  Temp 36.8 C 08/18/19 1350  Pulse 81 08/18/19 1351  Resp 18 08/18/19 1351  SpO2 96 % 08/18/19 1351  Vitals shown include unvalidated device data.  Last Pain:  Vitals:   08/18/19 1318  TempSrc:   PainSc: 0-No pain      Patients Stated Pain Goal: 6 ([id number])  Complications: No apparent anesthesia complications

## 2019-08-18 NOTE — H&P (Addendum)
@LOGO @   Primary Care Physician:  [name], Ysidro Evert, PA-C Primary Gastroenterologist:  Dr. Gala Romney  Pre-Procedure History & Physical: HPI:  Olivia [name] is a 64 y.o. female here for chronic intermittent paper hematochezia.  Last colonoscopy 5 to 6 years ago.  Past Medical History:  Diagnosis Date  . Dyslipidemia   . PAD (peripheral artery disease) ([place])   . PONV (postoperative nausea and vomiting)   . Psoriasis   . RA (rheumatoid arthritis) ([place])   . Vertigo     Past Surgical History:  Procedure Laterality Date  . CHOLECYSTECTOMY    . COLONOSCOPY  03/07/2015   Propofol; Surgeon: Dr. Laveda Norman; Three 6 mm rectosigmoid polyps, moderate left-sided diverticulosis, tortuous rectosigmoid colon. Pathology with inflamed hyperplastic polyp.  Marland Kitchen HYSTERECTOMY ABDOMINAL WITH SALPINGO-OOPHORECTOMY    . ILEOSTOMY  2005   per patient for bowel obstruction and peritonitis with anastomosis  . INSERTION OF MESH     RLQ    Prior to Admission medications   Medication Sig Start Date End Date Taking? Authorizing Provider  alendronate (FOSAMAX) 70 MG tablet Take 70 mg by mouth once a week. Take with a full glass of water on an empty stomach.   Yes [provider]  clopidogrel (PLAVIX) 75 MG tablet Take 75 mg by mouth daily.    Yes [provider]  Cyanocobalamin (VITAMIN B-12 IJ) Inject 1,000 mcg into the muscle every 7 (seven) days.    Yes [provider]  Dexlansoprazole 30 MG capsule Take 30 mg by mouth daily.    Yes [provider]  ergocalciferol (VITAMIN D2) 1.25 MG (50000 UT) capsule Take 50,000 Units by mouth once a week.   Yes [provider]  meclizine (ANTIVERT) 25 MG tablet Take 25 mg by mouth 3 (three) times daily as needed for dizziness.   Yes [provider]  ondansetron (ZOFRAN) 4 MG tablet Take 4 mg by mouth every 8 (eight) hours as needed for nausea or vomiting.   Yes [provider]  polyethylene glycol-electrolytes (TRILYTE)  420 g solution Take 4,000 mLs by mouth as directed. 05/30/19  Yes Kash Mothershead, Cristopher Estimable, MD  rosuvastatin (CRESTOR) 40 MG tablet Take 40 mg by mouth at bedtime.   Yes [provider]    Allergies as of 05/30/2019 - Review Complete 05/02/2019  Allergen Reaction Noted  . Codeine  05/02/2019    Family History  Problem Relation Age of Onset  . Lung cancer Mother   . Lung cancer Father   . Stomach cancer Brother   . Breast cancer Cousin   . Colon cancer Neg Hx     Social History   Socioeconomic History  . Marital status: Married    Spouse name: Not on file  . Number of children: Not on file  . Years of education: Not on file  . Highest education level: Not on file  Occupational History  . Not on file  Tobacco Use  . Smoking status: Former Smoker    Packs/day: 0.50    Years: 50.00    Pack years: 25.00    Types: Cigarettes    Quit date: 08/14/2018    Years since quitting: 1.0  . Smokeless tobacco: Never Used  Substance and Sexual Activity  . Alcohol use: Not Currently  . Drug use: Never  . Sexual activity: Yes  Other Topics Concern  . Not on file  Social History Narrative  . Not on file   Social Determinants of Health   Financial Resource Strain:   .  Difficulty of Paying Living Expenses: Not on file  Food Insecurity:   . Worried About Charity fundraiser in the Last Year: Not on file  . Ran Out of Food in the Last Year: Not on file  Transportation Needs:   . Lack of Transportation (Medical): Not on file  . Lack of Transportation (Non-Medical): Not on file  Physical Activity:   . Days of Exercise per Week: Not on file  . Minutes of Exercise per Session: Not on file  Stress:   . Feeling of Stress : Not on file  Social Connections:   . Frequency of Communication with Friends and Family: Not on file  . Frequency of Social Gatherings with Friends and Family: Not on file  . Attends Religious Services: Not on file  . Active Member of Clubs or Organizations: Not on  file  . Attends Archivist Meetings: Not on file  . Marital Status: Not on file  Intimate Partner Violence:   . Fear of Current or Ex-Partner: Not on file  . Emotionally Abused: Not on file  . Physically Abused: Not on file  . Sexually Abused: Not on file    Review of Systems: See HPI, otherwise negative ROS  Physical Exam: BP 137/70   Pulse 86   Temp 98.2 F (36.8 C) (Oral)   Resp 18   Ht 5\' 3"  (1.6 m)   Wt 72.6 kg   SpO2 98%   BMI 28.34 kg/m  General:   Alert,  Well-developed, well-nourished, pleasant and cooperative in NAD Neck:  Supple; no masses or thyromegaly. No significant cervical adenopathy. Lungs:  Clear throughout to auscultation.   No wheezes, crackles, or rhonchi. No acute distress. Heart:  Regular rate and rhythm; no murmurs, clicks, rubs,  or gallops. Abdomen: Non-distended, normal bowel sounds.  Soft and nontender without appreciable mass or hepatosplenomegaly.  Pulses:  Normal pulses noted. Extremities:  Without clubbing or edema.  Impression/Plan: 64 year old lady with intermittent hematochezia.  Chronic.  Patient here for a diagnostic colonoscopy per plan. The risks, benefits, limitations, alternatives and imponderables have been reviewed with the patient. Questions have been answered. All parties are agreeable.      Notice: This dictation was prepared with Dragon dictation along with smaller phrase technology. Any transcriptional errors that result from this process are unintentional and may not be corrected upon review.

## 2019-08-22 LAB — SURGICAL PATHOLOGY

## 2019-08-23 ENCOUNTER — Encounter: Payer: Self-pay | Admitting: Internal Medicine

## 2019-09-05 ENCOUNTER — Telehealth: Payer: Self-pay | Admitting: Gastroenterology

## 2019-09-05 NOTE — Telephone Encounter (Signed)
Received right upper quadrant ultrasound report dated 08/11/2019 completed at [facility].  Impression:  The liver demonstrates a mild echogenic architecture differential considerations hepatic steatosis versus hepatocellular disease.  Linear area of increased echogenicity centrally within the liver likely represent pneumobilia secondary to prior cholecystectomy.  Olivia [name], please let patient know I have received and reviewed her ultrasound report dated 08/11/19. I am not sure who ordered this, I assume her PCP? US shows fatty liver which we discussed at her last office visit. She should follow recommendations given at last office visit. No further recommendations at this time. Will have [place] report scanned into her chart and follow up as planned later this month.

## 2019-09-06 NOTE — Telephone Encounter (Signed)
Lmom, waiting on a return call.  

## 2019-09-07 NOTE — Telephone Encounter (Signed)
Pt returned call and was notified of [facility] recommendations.

## 2019-09-27 ENCOUNTER — Other Ambulatory Visit: Payer: Self-pay

## 2019-09-27 ENCOUNTER — Encounter: Payer: Self-pay | Admitting: *Deleted

## 2019-09-27 ENCOUNTER — Ambulatory Visit (INDEPENDENT_AMBULATORY_CARE_PROVIDER_SITE_OTHER): Payer: Medicare Other | Admitting: Gastroenterology

## 2019-09-27 ENCOUNTER — Encounter: Payer: Self-pay | Admitting: Gastroenterology

## 2019-09-27 DIAGNOSIS — R221 Localized swelling, mass and lump, neck: Secondary | ICD-10-CM | POA: Diagnosis not present

## 2019-09-27 DIAGNOSIS — R1011 Right upper quadrant pain: Secondary | ICD-10-CM

## 2019-09-27 NOTE — Patient Instructions (Signed)
We will hold off on hemorrhoid banding as you are on Plavix, and the hemorrhoids are not causing many issues.   I have ordered an ultrasound of your neck.  I am requesting blood work from last week!  I believe the next step may be an MRI to further evaluate liver and bile duct. You may need an endoscopy if this is all normal.  Further recommendations to follow!  It was a pleasure to see you today. I want to create trusting relationships with patients to provide genuine, compassionate, and quality care. I value your feedback. If you receive a survey regarding your visit,  I greatly appreciate you taking time to fill this out.   Annitta Needs, PhD, ANP-BC [facility]

## 2019-09-27 NOTE — Progress Notes (Signed)
Referring Provider: Berton Bon Primary Care Physician:  No primary care provider on file. Primary GI: Dr. Gala Romney   Chief Complaint  Patient presents with  . Abdominal Pain    "liver hurts"  . Hemorrhoids    HPI:   Olivia [name] is a 64 y.o. female presenting today with a history of elevated LFTs, RUQ abdominal pain. Patient was seen at [facility] ED in [place] on 03/13/2019 for further evaluation of abnormal labs after being seen in urgent care for abdominal pain and vomiting that began acutely after eating bacon wrapped scallops and crab dip.  In the ED, alk phos elevated at 267, AST elevated 605, ALT elevated 765, bilirubin elevated 2.9, lipase normal, kidney function and electrolytes within normal limits, WBC elevated at 14, Hgb normal at 13 with normocytic indices, platelets elevated at 673.  CT abdomen and pelvis with IV contrast with heterogenous hepatic enhancement with geographic fatty infiltration, prominence of distal common bile duct without suspicion of obstruction, prior sphincterotomy cholecystectomy with air in the biliary tract, diverticulosis without diverticulitis, and bilateral nephrolithiasis without obstruction.  Repeat labs on 03/22/2019 with alk phos elevated at 189, ALT elevated at 67, AST normal, total bilirubin normal.  Hepatitis C antibody negative.  Hepatitis B surface antigen and core antibody IgM negative.  Hepatitis A antibody IgM negative.  WBC elevated at 12.5.  Platelets elevated at 714. LFTs checked again in Oct 2020 when seen by [place] in consultation, returned to baseline. Hep A antibody non-reactive. Hep B surface antigen negative, Hep C antibody negative on outside labs.    Recent colonoscopy Feb 2021 with non-bleeding internal hemorrhoids, pancolonic diverticulosis, two 4-7 mm polyps in rectum benign, no adenomas. Next colonoscopy in 10 years.   Has RUQ pain with food. Steaks, hamburger, calzone. Only hurts postprandially. No ETOH use. Pain present since  episode with elevated LFTs. No dysphagia. States there is swelling in right neck. Mild reflux when laying down if eating late.   Stays on the looser stool side. Had diarrhea for 3 days after eating pistachios. Didn't get checked out. BM usually every day.   Has only bled a few times in her life. Sometimes swelling if sitting a lot. No itching. No pain.   Hematology last seen Aug 2020 Olivia [name]. History of chronically elevated WBC count and platelets.     Past Medical History:  Diagnosis Date  . Dyslipidemia   . PAD (peripheral artery disease) ([place])   . PONV (postoperative nausea and vomiting)   . Psoriasis   . RA (rheumatoid arthritis) ([place])   . Vertigo     Past Surgical History:  Procedure Laterality Date  . CHOLECYSTECTOMY    . COLONOSCOPY  03/07/2015   Propofol; Surgeon: Dr. Laveda Norman; Three 6 mm rectosigmoid polyps, moderate left-sided diverticulosis, tortuous rectosigmoid colon. Pathology with inflamed hyperplastic polyp.  . COLONOSCOPY WITH PROPOFOL N/A 08/18/2019   non-bleeding internal hemorrhoids, pancolonic diverticulosis, two 4-7 mm polyps in rectum benign, no adenomas. Next colonoscopy in 10 years.   Marland Kitchen HYSTERECTOMY ABDOMINAL WITH SALPINGO-OOPHORECTOMY    . ILEOSTOMY  2005   per patient for bowel obstruction and peritonitis with anastomosis  . INSERTION OF MESH     RLQ  . POLYPECTOMY  08/18/2019   Procedure: POLYPECTOMY;  Surgeon: Daneil Dolin, MD;  Location: [facility];  Service: Endoscopy;;    Current Outpatient Medications  Medication Sig Dispense Refill  . alendronate (FOSAMAX) 70 MG tablet Take 70 mg by mouth once a week. Take  with a full glass of water on an empty stomach.    . clopidogrel (PLAVIX) 75 MG tablet Take 75 mg by mouth daily.     . Cyanocobalamin (VITAMIN B-12 IJ) Inject 1,000 mcg into the muscle every 7 (seven) days.     Marland Kitchen Dexlansoprazole 30 MG capsule Take 30 mg by mouth as needed.     . ergocalciferol (VITAMIN D2) 1.25 MG (50000 UT) capsule  Take 50,000 Units by mouth once a week.    . meclizine (ANTIVERT) 25 MG tablet Take 25 mg by mouth 3 (three) times daily as needed for dizziness.    . ondansetron (ZOFRAN) 4 MG tablet Take 4 mg by mouth every 8 (eight) hours as needed for nausea or vomiting.    . rosuvastatin (CRESTOR) 40 MG tablet Take 40 mg by mouth at bedtime.    . sulfaSALAzine (AZULFIDINE) 500 MG tablet Take 500 mg by mouth in the morning, at noon, in the evening, and at bedtime.     No current facility-administered medications for this visit.    Allergies as of 09/27/2019 - Review Complete 09/27/2019  Allergen Reaction Noted  . Codeine  05/02/2019    Family History  Problem Relation Age of Onset  . Lung cancer Mother   . Lung cancer Father   . Stomach cancer Brother   . Breast cancer Cousin   . Colon cancer Neg Hx     Social History   Socioeconomic History  . Marital status: Married    Spouse name: Not on file  . Number of children: Not on file  . Years of education: Not on file  . Highest education level: Not on file  Occupational History  . Not on file  Tobacco Use  . Smoking status: Former Smoker    Packs/day: 0.50    Years: 50.00    Pack years: 25.00    Types: Cigarettes    Quit date: 08/14/2018    Years since quitting: 1.1  . Smokeless tobacco: Never Used  Substance and Sexual Activity  . Alcohol use: Not Currently  . Drug use: Never  . Sexual activity: Yes  Other Topics Concern  . Not on file  Social History Narrative  . Not on file   Social Determinants of Health   Financial Resource Strain:   . Difficulty of Paying Living Expenses:   Food Insecurity:   . Worried About Charity fundraiser in the Last Year:   . Arboriculturist in the Last Year:   Transportation Needs:   . Film/video editor (Medical):   Marland Kitchen Lack of Transportation (Non-Medical):   Physical Activity:   . Days of Exercise per Week:   . Minutes of Exercise per Session:   Stress:   . Feeling of Stress :     Social Connections:   . Frequency of Communication with Friends and Family:   . Frequency of Social Gatherings with Friends and Family:   . Attends Religious Services:   . Active Member of Clubs or Organizations:   . Attends Archivist Meetings:   Marland Kitchen Marital Status:     Review of Systems: Gen: Denies fever, chills, anorexia. Denies fatigue, weakness, weight loss.  CV: Denies chest pain, palpitations, syncope, peripheral edema, and claudication. Resp: Denies dyspnea at rest, cough, wheezing, coughing up blood, and pleurisy. GI: see HPI Derm: Denies rash, itching, dry skin Psych: Denies depression, anxiety, memory loss, confusion. No homicidal or suicidal ideation.  Heme: Denies bruising, bleeding,  and enlarged lymph nodes.  Physical Exam: BP (!) 124/59   Pulse 72   Temp (!) 96.9 F (36.1 C) (Temporal)   Ht 5' 3" (1.6 m)   Wt 160 lb 3.2 oz (72.7 kg)   BMI 28.38 kg/m  General:   Alert and oriented. No distress noted. Pleasant and cooperative.  Head:  Normocephalic and atraumatic. Neck with left sided supraclavicular prominence and fullness, noticeable with simple observation and palpation revealing fullness, ?fluctuance,  Eyes:  Conjuctiva clear without scleral icterus. Mouth:  Mask in place Abdomen:  +BS, soft, non-tender and non-distended. No rebound or guarding. No HSM or masses noted. Msk:  Symmetrical without gross deformities. Normal posture. Extremities:  Without edema. Neurologic:  Alert and  oriented x4 Psych:  Alert and cooperative. Normal mood and affect.  ASSESSMENT: Jennea Rager is a 64 y.o. female presenting today with RUQ pain dating back to Sept 2020, with notable mixed pattern elevated LFTs at the time of acute onset after eating bacon wrapped scallops and crab dip, with CT showing fatty liver and prominence of distal CBD without obstruction, prior cholecystectomy. LFTs trended down steadily shortly thereafter, with return to baseline as of Oct 2020.  Viral serologies negative (Hep B surface antigen, Hep C antibody, Hep A IgM). She continues with persistent postprandial RUQ pain intermittently. Will need to retrieve most recent labs and determine if further serologies needed or imaging.   Neck mass: left-sided supraclavicular fluctuance, fullness noted. Patient reports this has been enlarging recently. US neck ordered.   History of hemorrhoids: she has minor symptoms, and as she is on Plavix, I would recommend holding off on any intervention as her symptoms are minimal. Risks at this time would outweigh any benefits.    PLAN:   Obtain outside labs  Neck ultrasound  Further recommendations to follow  Annitta Needs, PhD, ANP-BC [facility]

## 2019-09-30 ENCOUNTER — Ambulatory Visit (HOSPITAL_COMMUNITY): Admission: RE | Admit: 2019-09-30 | Payer: Medicare Other | Source: Ambulatory Visit

## 2019-10-01 ENCOUNTER — Encounter: Payer: Self-pay | Admitting: Gastroenterology

## 2019-10-10 ENCOUNTER — Telehealth: Payer: Self-pay | Admitting: Gastroenterology

## 2019-10-10 NOTE — Telephone Encounter (Signed)
Labs received from Feb 20201 outside facility.   Tbili 0.3, Alk Phos 107, AST 21, ALT 24.  Is patient still having RUQ pain? LFTs have been remaining normal.

## 2019-10-10 NOTE — Telephone Encounter (Signed)
Spoke with pt. Pt notified that labs were reviewed. Pain was consistent but this week the pain has lightened up a lot. Pt hasn't felt the pain this week. Pt hasn't eaten any red meat or pork and feels the pain may be diet related. Pt is asking were, you going to still do the MRI. Please advise.

## 2019-10-11 ENCOUNTER — Encounter: Payer: Self-pay | Admitting: Internal Medicine

## 2019-10-11 NOTE — Telephone Encounter (Signed)
Lmom, waiting on a return call.  

## 2019-10-11 NOTE — Telephone Encounter (Signed)
Spoke with pt. Pt is aware of AB recommendations.

## 2019-10-11 NOTE — Telephone Encounter (Signed)
Avoid red meat and pork. No MRI unless LFTs bump. Would pursue EGD if persistent RUQ pain.   Let's make sure she has an appt in 4 months

## 2019-10-12 ENCOUNTER — Ambulatory Visit (HOSPITAL_COMMUNITY)
Admission: RE | Admit: 2019-10-12 | Discharge: 2019-10-12 | Disposition: A | Discharge status: Home / Self Care | Payer: Medicare Other | Source: Ambulatory Visit | Attending: Gastroenterology | Admitting: Gastroenterology

## 2019-10-12 ENCOUNTER — Other Ambulatory Visit: Payer: Self-pay

## 2019-10-12 DIAGNOSIS — R221 Localized swelling, mass and lump, neck: Secondary | ICD-10-CM | POA: Diagnosis present

## 2019-11-08 ENCOUNTER — Encounter: Payer: Self-pay | Admitting: Gastroenterology

## 2019-11-10 ENCOUNTER — Telehealth: Payer: Self-pay | Admitting: Internal Medicine

## 2019-11-10 NOTE — Telephone Encounter (Signed)
The whites of pts eyes turned yellow last Saturday. Pt has been having a burning sensation where the liver is located. Pt hasn't been dx with Jaundice. Pt hasn't gone to the doctor and states she was on vacation. Pt wanted to let AB know what's going on. Pt said after her TCS, she was told she wasn't going to get an MRI and to f/u in 02/2020. Pt isn't sure if she needs to be seen by another doctor or be seen here. Pt said her symptoms should not be ignored.

## 2019-11-10 NOTE — Telephone Encounter (Signed)
Lmom, waiting on a return call.  

## 2019-11-10 NOTE — Telephone Encounter (Signed)
FYI Spoke with pt. Pt saw her PCP today and was given lab testing. Pt was referred to a Dr. [place].

## 2019-11-10 NOTE — Telephone Encounter (Signed)
I agree, symptoms should not be ignored, so it would be best going forward if she seeks care immediately when she notices this. I note it was last Saturday she noted possible scleral icterus?   Need to check CBC, CMP, lipase today.   Have her in the office with either myself or Cyril Mourning, as we have both seen her within the next few days.   If she has fever, pain, yellowing of skin/eyes still, needs to go to ED>

## 2019-11-10 NOTE — Telephone Encounter (Signed)
[place], SHE IS HAVING ANOTHER BOUT WITH JAUNDICE AND SHE IS HAVING PAIN IN THE AREA OF HER LIVER

## 2019-11-21 ENCOUNTER — Telehealth: Payer: Self-pay | Admitting: *Deleted

## 2019-11-21 ENCOUNTER — Other Ambulatory Visit: Payer: Self-pay | Admitting: *Deleted

## 2019-11-21 DIAGNOSIS — R1011 Right upper quadrant pain: Secondary | ICD-10-CM

## 2019-11-21 NOTE — Telephone Encounter (Signed)
  Pt is aware that we will look for stool studies once faxed to [place].  Pt said that she would e-mail [place] her labs.  AB made aware.  Pt said that she was seeking a hepatologist for her liver in [facility].  She said that we told her we could not order an upper GI or MRI so that's why she requested to see [facility].  She said if it is something we can manage and order test for then she would stay here.  She wants to continue her care here.  I reviewed the soft, low fat diet in full detail with pt and am going to mail her a copy.  She is aware to have labs drawn and requested to have them drawn at [facility] in [place], [place].  Faxed lab orders.

## 2019-11-21 NOTE — Telephone Encounter (Signed)
I will keep an eye out for stool studies.  We need labs from PCP done 1-2 weeks ago. Patient called on 5/6 and I had recommended CBC, CMP, lipase. From that note, patient states she had been referred elsewhere in [facility].  Is she keeping her care here? If so, please order CBC, CMP, lipase. Follow soft, low-fat diet. I can't really recommend anything since we didn't see her during that time and do not have any labs to review.

## 2019-11-21 NOTE — Telephone Encounter (Signed)
Noted  

## 2019-11-21 NOTE — Telephone Encounter (Signed)
We never told her we couldn't order those tests. Not sure where the miscommunication came from. Will review labs as available.

## 2019-11-21 NOTE — Telephone Encounter (Signed)
Pt called in and said that she was in severe pain over the weekend.  Pt says she had chinese food on Friday night and felt needle like feeling in her abd with cramping,  Pt vomited immediately after eating.  Stayed in fetal position for a few hours and then got up because she felt thirsty.  Threw up water a few more hours later.  Yellowish emesis accompanied with green colored diarrhea. Pt went to walk in clinic on Saturday.  Pt said that she was prescribed Ciprofloxacin HCl and nausea medicine.   Pt says that she does take iron pills on a daily basis.  Pt reports no blood in stool.  Pt says she does feel some better today.  Pt says she is not eating much though.  She wants to know if we recommend her eating or what we recommend. Pt wants [place] to review her stool studies from last year.  She is calling [facility] to have it faxed over.  [phone]

## 2019-11-23 ENCOUNTER — Other Ambulatory Visit: Payer: Self-pay | Admitting: *Deleted

## 2019-11-23 ENCOUNTER — Telehealth: Payer: Self-pay | Admitting: *Deleted

## 2019-11-23 DIAGNOSIS — R1011 Right upper quadrant pain: Secondary | ICD-10-CM

## 2019-11-23 NOTE — Telephone Encounter (Signed)
Pt called in and requested H pylori breath test to be done while she was at [place] to AB and pt was advised that she must hold PPI, tums, etc for a full 2 weeks and would have to be NPO the morning of the test.  Pt voiced understanding and requested [place] to put in order for 2 weeks from now.  Ok to proceed with H pylori test in 2 weeks per AB.  Order placed and faxed to [facility].

## 2019-11-24 LAB — CBC WITH DIFFERENTIAL/PLATELET
Basophils Absolute: 0.1 10*3/uL (ref 0.0–0.2)
Basos: 1 %
EOS (ABSOLUTE): 0.1 10*3/uL (ref 0.0–0.4)
Eos: 1 %
Hematocrit: 40.2 % (ref 34.0–46.6)
Hemoglobin: 12.9 g/dL (ref 11.1–15.9)
Immature Grans (Abs): 0.1 10*3/uL (ref 0.0–0.1)
Immature Granulocytes: 1 %
Lymphocytes Absolute: 4.1 10*3/uL — ABNORMAL HIGH (ref 0.7–3.1)
Lymphs: 28 %
MCH: 26.8 pg (ref 26.6–33.0)
MCHC: 32.1 g/dL (ref 31.5–35.7)
MCV: 84 fL (ref 79–97)
Monocytes Absolute: 1.3 10*3/uL — ABNORMAL HIGH (ref 0.1–0.9)
Monocytes: 9 %
Neutrophils Absolute: 8.7 10*3/uL — ABNORMAL HIGH (ref 1.4–7.0)
Neutrophils: 60 %
Platelets: 665 10*3/uL — ABNORMAL HIGH (ref 150–450)
RBC: 4.81 x10E6/uL (ref 3.77–5.28)
RDW: 14.1 % (ref 11.7–15.4)
WBC: 14.4 10*3/uL — ABNORMAL HIGH (ref 3.4–10.8)

## 2019-11-24 LAB — COMPREHENSIVE METABOLIC PANEL
ALT: 142 IU/L — ABNORMAL HIGH (ref 0–32)
AST: 27 IU/L (ref 0–40)
Albumin/Globulin Ratio: 1.6 (ref 1.2–2.2)
Albumin: 4.4 g/dL (ref 3.8–4.8)
Alkaline Phosphatase: 163 IU/L — ABNORMAL HIGH (ref 48–121)
BUN/Creatinine Ratio: 20 (ref 12–28)
BUN: 16 mg/dL (ref 8–27)
Bilirubin Total: 0.3 mg/dL (ref 0.0–1.2)
CO2: 22 mmol/L (ref 20–29)
Calcium: 10.1 mg/dL (ref 8.7–10.3)
Chloride: 102 mmol/L (ref 96–106)
Creatinine, Ser: 0.82 mg/dL (ref 0.57–1.00)
GFR calc Af Amer: 87 mL/min/{1.73_m2} (ref >59)
GFR calc non Af Amer: 75 mL/min/{1.73_m2} (ref >59)
Globulin, Total: 2.8 g/dL (ref 1.5–4.5)
Glucose: 102 mg/dL — ABNORMAL HIGH (ref 65–99)
Potassium: 4.7 mmol/L (ref 3.5–5.2)
Sodium: 139 mmol/L (ref 134–144)
Total Protein: 7.2 g/dL (ref 6.0–8.5)

## 2019-11-24 LAB — LIPASE: Lipase: 36 U/L (ref 14–72)

## 2019-11-25 ENCOUNTER — Other Ambulatory Visit: Payer: Self-pay

## 2019-11-25 DIAGNOSIS — D72829 Elevated white blood cell count, unspecified: Secondary | ICD-10-CM

## 2019-11-25 DIAGNOSIS — R7989 Other specified abnormal findings of blood chemistry: Secondary | ICD-10-CM

## 2019-11-25 NOTE — Progress Notes (Unsigned)
mr

## 2019-12-07 ENCOUNTER — Other Ambulatory Visit: Payer: Self-pay | Admitting: Gastroenterology

## 2019-12-08 LAB — COMPREHENSIVE METABOLIC PANEL
ALT: 37 IU/L — ABNORMAL HIGH (ref 0–32)
AST: 25 IU/L (ref 0–40)
Albumin/Globulin Ratio: 1.4 (ref 1.2–2.2)
Albumin: 4.3 g/dL (ref 3.8–4.8)
Alkaline Phosphatase: 102 IU/L (ref 48–121)
BUN/Creatinine Ratio: 20 (ref 12–28)
BUN: 15 mg/dL (ref 8–27)
Bilirubin Total: <0.2 mg/dL (ref 0.0–1.2)
CO2: 25 mmol/L (ref 20–29)
Calcium: 10 mg/dL (ref 8.7–10.3)
Chloride: 103 mmol/L (ref 96–106)
Creatinine, Ser: 0.76 mg/dL (ref 0.57–1.00)
GFR calc Af Amer: 95 mL/min/{1.73_m2} (ref >59)
GFR calc non Af Amer: 83 mL/min/{1.73_m2} (ref >59)
Globulin, Total: 3 g/dL (ref 1.5–4.5)
Glucose: 96 mg/dL (ref 65–99)
Potassium: 4.5 mmol/L (ref 3.5–5.2)
Sodium: 143 mmol/L (ref 134–144)
Total Protein: 7.3 g/dL (ref 6.0–8.5)

## 2019-12-08 LAB — CBC WITH DIFFERENTIAL/PLATELET
Basophils Absolute: 0.1 10*3/uL (ref 0.0–0.2)
Basos: 1 %
EOS (ABSOLUTE): 0.1 10*3/uL (ref 0.0–0.4)
Eos: 1 %
Hematocrit: 44.4 % (ref 34.0–46.6)
Hemoglobin: 13.7 g/dL (ref 11.1–15.9)
Immature Grans (Abs): 0 10*3/uL (ref 0.0–0.1)
Immature Granulocytes: 0 %
Lymphocytes Absolute: 5.5 10*3/uL — ABNORMAL HIGH (ref 0.7–3.1)
Lymphs: 31 %
MCH: 27.1 pg (ref 26.6–33.0)
MCHC: 30.9 g/dL — ABNORMAL LOW (ref 31.5–35.7)
MCV: 88 fL (ref 79–97)
Monocytes Absolute: 1.4 10*3/uL — ABNORMAL HIGH (ref 0.1–0.9)
Monocytes: 8 %
Neutrophils Absolute: 10.3 10*3/uL — ABNORMAL HIGH (ref 1.4–7.0)
Neutrophils: 59 %
Platelets: 580 10*3/uL — ABNORMAL HIGH (ref 150–450)
RBC: 5.06 x10E6/uL (ref 3.77–5.28)
RDW: 14.5 % (ref 11.7–15.4)
WBC: 17.5 10*3/uL — ABNORMAL HIGH (ref 3.4–10.8)

## 2019-12-08 LAB — LIPASE: Lipase: 44 U/L (ref 14–72)

## 2019-12-21 ENCOUNTER — Telehealth: Payer: Self-pay | Admitting: Gastroenterology

## 2019-12-21 NOTE — Telephone Encounter (Signed)
Spoke with pt. Pt is aware that she will need an ERCP. Per AB, she is going to speak with Dr. Dorien Chihuahua about the ERCP. Pt will monitor for fever ect and call our office back if needed.    Olivia [name] please request records from [place] [facility] states that where she had her CT.  MB ERCP will be scheduled when advised by AB.

## 2019-12-21 NOTE — Telephone Encounter (Signed)
MRCP reviewed from [facility], completed due to intermittent RUQ pain, transient fluctuations in LFTs.   MRI abdomen with MRCP on 6/15:   CBD dilated to level of ampulla, up to 1.3 cm, indeterminate filling defects, indeterminate low signal focus near pancreatic head spanning up to 1.8 cm, ?calcification?   She had a CT in past, but I now don't see that scanned into epic?   Please let her know she will need an ERCP to further evaluate the CBD. Call if fever, chills, RUQ pain, jaundice. Need to monitor for cholangitis.   I am reaching out to Dr. Laural Golden regarding ERCP. Unclear what is going on at the pancreatic head, and I need to have prior CTs to review. Can we request that again from [place]??

## 2019-12-23 NOTE — Telephone Encounter (Signed)
Reviewed with Dr. Laural Golden.  I am copying him on this. Patient needs an ERCP due to elevated LFTs, abnormal CBD with filling defects.

## 2019-12-26 ENCOUNTER — Other Ambulatory Visit: Payer: Self-pay

## 2019-12-26 ENCOUNTER — Other Ambulatory Visit (INDEPENDENT_AMBULATORY_CARE_PROVIDER_SITE_OTHER): Payer: Self-pay | Admitting: *Deleted

## 2019-12-26 ENCOUNTER — Encounter (INDEPENDENT_AMBULATORY_CARE_PROVIDER_SITE_OTHER): Payer: Self-pay | Admitting: Internal Medicine

## 2019-12-26 ENCOUNTER — Ambulatory Visit (INDEPENDENT_AMBULATORY_CARE_PROVIDER_SITE_OTHER): Payer: Medicare Other | Admitting: Internal Medicine

## 2019-12-26 DIAGNOSIS — K8050 Calculus of bile duct without cholangitis or cholecystitis without obstruction: Secondary | ICD-10-CM | POA: Diagnosis not present

## 2019-12-26 NOTE — H&P (View-Only) (Signed)
Referring provider:  Annitta Needs, PhD, ANP-BC PCP:  Edson Snowball,, FNP  Reason for consultation  Choledocholithiasis.  History of present illness  Patient is 64 year old Caucasian female who is here for scheduled visit accompanied by her husband Joe. Patient is referred through courtesy of Ms. Roseanne Kaufman, [facility] for therapeutic ERCP.  Patient's present illness began last year in September when she developed nausea vomiting and right upper quadrant abdominal pain about 45 minutes after evening meal.  Her symptoms resolved after 90 minutes.  Following day which was 03/13/2019 she was evaluated at an emergency department in progress note [place] and noted to have abnormal LFTs.  Bilirubin was 2.9 AST was 605 ALT 765 alkaline phosphatase was 267.  Abdominal pelvic CT revealed prominent bile duct and fatty liver with no evidence of choledocholithiasis.  He also had a hearing biliary tree felt to be due to prior sphincterotomy.  He also showed bilateral nephrolithiasis without obstruction.  She was discharged on Levsin to be used on as-needed basis.  She had follow-up blood tests few days later bilirubin and AST were normal.  AST was 189 and ALT 67.  Viral markers for hepatitis A, B, and C were negative. Patient also had abdominal ultrasound on 08/11/2019 revealing echogenic liver but bile duct measured 2.2 mm.  There was question of hemobilia.   Patient was referred to [facility] and she was seen by Ms. Roseanne Kaufman, ANP on 09/24/2019 she was noted to have fullness in left supra clavicular area.  Ultrasound was obtained and it showed her to be a lipoma. Patient states she had another episode of pain on 10/23/2019 when she experienced stabbing pain in right upper quadrant.  On 11/04/2019 she had another episode of pain and noted her to be jaundiced.  Last episode was on 11/18/2018. Given patient's symptoms MRCP was obtained on 12/20/2019 which  revealed bile duct measuring 16 mm down to the level of ampulla with filling defects.  There was 18 mm defect in distal CBD raising possibility of pancreatic calcification or calcified stone.  Patient states she has had 4 episodes of pain nausea and vomiting altogether. Patient presently is pain-free.  She denies weight loss anorexia.  None of these episodes have been associated with fever or chills.  There is no prior history of ERCP. She had cholecystectomy at the time of small bowel resection in 2005.  She does not remember if she had stones positive gallbladder was inflamed. She complains of feeling weak and tired.  He says heartburn is well controlled with therapy.  She denies dysphagia.  Patient states she feels much better when she is taking B12.  She is not short of she has been documented to have B12 deficiency.  She is also not sure as to the length of small bowel segment that was removed in 2005.   Current Medications: Outpatient Encounter Medications as of 12/26/2019  Medication Sig  . clopidogrel (PLAVIX) 75 MG tablet Take 75 mg by mouth daily.   . Cyanocobalamin (VITAMIN B 12) 500 MCG TABS Take 3,000 mcg by mouth daily. This is subling.  . Cyanocobalamin (VITAMIN B-12 IJ) Inject 1,000 mcg into the muscle every 7 (seven) days.   Marland Kitchen Dexlansoprazole 30 MG capsule Take 30 mg by mouth as needed.   . ergocalciferol (VITAMIN D2) 1.25 MG (50000 UT) capsule Take 50,000 Units by mouth once a week.  . hyoscyamine (LEVSIN) 0.125 MG/5ML ELIX Take 0.125 mg by mouth 4 (four) times  daily.  . meclizine (ANTIVERT) 25 MG tablet Take 25 mg by mouth 3 (three) times daily as needed for dizziness.  . ondansetron (ZOFRAN) 4 MG tablet Take 4 mg by mouth every 8 (eight) hours as needed for nausea or vomiting.  . predniSONE (DELTASONE) 10 MG tablet Take 10 mg by mouth daily with breakfast.  . rosuvastatin (CRESTOR) 40 MG tablet Take 40 mg by mouth at bedtime.  . sulfaSALAzine (AZULFIDINE) 500 MG tablet Take 500  mg by mouth in the morning, at noon, in the evening, and at bedtime.  . [DISCONTINUED] alendronate (FOSAMAX) 70 MG tablet Take 70 mg by mouth once a week. Take with a full glass of water on an empty stomach.   No facility-administered encounter medications on file as of 12/26/2019.   Past medical history  Hyperlipidemia.  She has been on therapy for 7 years.  She is concerned because levels have not her satisfaction. Chronic GERD. History of leukocytosis and thrombocytosis for which she has undergone extensive evaluation including bone marrow aspiration and biopsy at [place] in [place].  No etiology discovered. Rheumatoid arthritis was diagnosed 3 years ago. Peripheral vascular disease.  He presented with carpal right second toe.  Angiography revealed occluded dorsalis pedis artery.  Her work-up was negative for thromboembolism.  Pain resolved spontaneously.  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 She had hysterectomy 1993. Acute peritonitis in 2005 requiring emergency laparotomy with resection of segment of small bowel and ileostomy which was reversed 6 months later.  Patient had cholecystectomy at the time of surgery. She has screening colonoscopy in August 2016 revealing hyperplastic polyp. Diagnostic colonoscopy February 2021 for hematochezia revealing pancolonic diverticulosis internal hemorrhoids and 2 small polyps removed.  1 polyp was a small leiomyoma and other polyp was hyperplastic.   Allergies  Allergies  Allergen Reactions  . Codeine     Headache-"draws line across forehead"   Family history  Father had coronary artery disease and lung carcinoma.  He died at age 80. Mother developed breast carcinoma at age 81 and few days later she developed lymphoma and lived to be 24. She has a sister age 84 with chronic stomach issues with doing fairly well. Her brother died at age 19 of stomach cancer.  Social history  Patient is married.  She  is accompanied by her husband Joe.  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  She smokes cigarettes for 40 years less than 1 pack/day and quit in 2018.  She does not drink alcohol.  Her daughter died of metastatic cervical carcinoma at age 66.  She had chronic HBV infection.  She and her husband have raised 3 adopted children and 2 of them 15.   Physical examination  Blood pressure 128/72, pulse 80, temperature 98.5 F (36.9 C), temperature source Oral, height 5' 3"  (1.6 m), weight 159 lb 11.2 oz (72.4 kg). Patient is alert and in no acute distress. Patient is wearing a mask. Conjunctiva is pink. Sclera is nonicteric Oropharyngeal mucosa is normal. No neck masses or thyromegaly noted. Cardiac exam with regular rhythm normal S1 and S2. No murmur or gallop noted. Lungs are clear to auscultation. Abdomen is full.  She has lower midline scar along with small scar in right lower quadrant site of prior ileostomy.  Bowel sounds are normal.  On palpation abdomen is soft and nontender with organomegaly or masses. No LE edema or clubbing noted.  Labs/studies Results:  CBC Latest Ref Rng & Units 12/07/2019 11/23/2019 05/02/2019  WBC 3.4 - 10.8 x10E3/uL 17.5(H) 14.4(H) 16.6(H)  Hemoglobin 11.1 - 15.9 g/dL 13.7 12.9 12.7  Hematocrit 34.0 - 46.6 % 44.4 40.2 39.0  Platelets 150 - 450 x10E3/uL 580(H) 665(H) 676(H)    CMP Latest Ref Rng & Units 12/07/2019 11/23/2019 05/02/2019  Glucose 65 - 99 mg/dL 96 102(H) -  BUN 8 - 27 mg/dL 15 16 -  Creatinine 0.57 - 1.00 mg/dL 0.76 0.82 -  Sodium 134 - 144 mmol/L 143 139 -  Potassium 3.5 - 5.2 mmol/L 4.5 4.7 -  Chloride 96 - 106 mmol/L 103 102 -  CO2 20 - 29 mmol/L 25 22 -  Calcium 8.7 - 10.3 mg/dL 10.0 10.1 -  Total Protein 6.0 - 8.5 g/dL 7.3 7.2 6.7  Total Bilirubin 0.0 - 1.2 mg/dL <0.2 0.3 0.3  Alkaline Phos 48 - 121 IU/L 102 163(H) -  AST 0 - 40 IU/L 25 27 20   ALT 0 - 32 IU/L 37(H) 142(H) 21    Hepatic Function  Latest Ref Rng & Units 12/07/2019 11/23/2019 05/02/2019  Total Protein 6.0 - 8.5 g/dL 7.3 7.2 6.7  Albumin 3.8 - 4.8 g/dL 4.3 4.4 -  AST 0 - 40 IU/L 25 27 20   ALT 0 - 32 IU/L 37(H) 142(H) 21  Alk Phosphatase 48 - 121 IU/L 102 163(H) -  Total Bilirubin 0.0 - 1.2 mg/dL <0.2 0.3 0.3  Bilirubin, Direct 0.0 - 0.2 mg/dL - - 0.1    CT, ultrasound and MRCP results are reviewed under history of present illness. None of these images are available for review.  Assessment:  Patient is 64 year old Caucasian female who presents with intermittent right upper quadrant pain associated with nausea and vomiting.  First episode occurred about 9 months ago and last episode was about 5 weeks ago.  She has been documented to have fluctuating transaminases and  she also developed jaundice 7 weeks ago with spontaneous clearance.  CT of September 2020 and ultrasound of February 2021 failed to show cholelithiasis.  These 2 studies suggested pneumobilia hence prior sphincterotomy but no such history is given. MRCP now shows markedly dilated biliary system with filling defects consistent with stones.  She may also have a large calcified stone distally.  No history of pancreatitis. Patient will benefit from diagnostic and therapeutic ERCP. Patient's transaminases are near normal and therefore will not stop statin at this time.  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  Extensive work-up in the past negative for bone marrow neoplasm.  Rheumatoid arthritis.  Patient is on sulfasalazine.   Recommendations  ERCP with biliary sphincterotomy and stone extraction.  I reviewed the procedure risks with the patient and her husband in detail.  Bleeding pancreatitis and injury to bowel wall. They are both agreeable to proceed with the study. Patient will need to be off Plavix/clopidogrel for 5 days.  Will check with PCP. Patient will have CBC, c-Met and INR at preop  evaluation. Will request MRCP images. Patient advised to call if she has another episode of abdominal pain fever or chills.

## 2019-12-26 NOTE — Progress Notes (Addendum)
Referring provider:  Annitta Needs, PhD, ANP-BC PCP:  Edson Snowball,, FNP  Reason for consultation  Choledocholithiasis.  History of present illness  Patient is 64 year old Caucasian female who is here for scheduled visit accompanied by her husband Joe. Patient is referred through courtesy of Ms. Roseanne Kaufman, [facility] for therapeutic ERCP.  Patient's present illness began last year in September when she developed nausea vomiting and right upper quadrant abdominal pain about 45 minutes after evening meal.  Her symptoms resolved after 90 minutes.  Following day which was 03/13/2019 she was evaluated at an emergency department in progress note [place] and noted to have abnormal LFTs.  Bilirubin was 2.9 AST was 605 ALT 765 alkaline phosphatase was 267.  Abdominal pelvic CT revealed prominent bile duct and fatty liver with no evidence of choledocholithiasis.  He also had a hearing biliary tree felt to be due to prior sphincterotomy.  He also showed bilateral nephrolithiasis without obstruction.  She was discharged on Levsin to be used on as-needed basis.  She had follow-up blood tests few days later bilirubin and AST were normal.  AST was 189 and ALT 67.  Viral markers for hepatitis A, B, and C were negative. Patient also had abdominal ultrasound on 08/11/2019 revealing echogenic liver but bile duct measured 2.2 mm.  There was question of hemobilia.   Patient was referred to [facility] and she was seen by Ms. Roseanne Kaufman, ANP on 09/24/2019 she was noted to have fullness in left supra clavicular area.  Ultrasound was obtained and it showed her to be a lipoma. Patient states she had another episode of pain on 10/23/2019 when she experienced stabbing pain in right upper quadrant.  On 11/04/2019 she had another episode of pain and noted her to be jaundiced.  Last episode was on 11/18/2018. Given patient's symptoms MRCP was obtained on 12/20/2019 which  revealed bile duct measuring 16 mm down to the level of ampulla with filling defects.  There was 18 mm defect in distal CBD raising possibility of pancreatic calcification or calcified stone.  Patient states she has had 4 episodes of pain nausea and vomiting altogether. Patient presently is pain-free.  She denies weight loss anorexia.  None of these episodes have been associated with fever or chills.  There is no prior history of ERCP. She had cholecystectomy at the time of small bowel resection in 2005.  She does not remember if she had stones positive gallbladder was inflamed. She complains of feeling weak and tired.  He says heartburn is well controlled with therapy.  She denies dysphagia.  Patient states she feels much better when she is taking B12.  She is not short of she has been documented to have B12 deficiency.  She is also not sure as to the length of small bowel segment that was removed in 2005.   Current Medications: Outpatient Encounter Medications as of 12/26/2019  Medication Sig  . clopidogrel (PLAVIX) 75 MG tablet Take 75 mg by mouth daily.   . Cyanocobalamin (VITAMIN B 12) 500 MCG TABS Take 3,000 mcg by mouth daily. This is subling.  . Cyanocobalamin (VITAMIN B-12 IJ) Inject 1,000 mcg into the muscle every 7 (seven) days.   Marland Kitchen Dexlansoprazole 30 MG capsule Take 30 mg by mouth as needed.   . ergocalciferol (VITAMIN D2) 1.25 MG (50000 UT) capsule Take 50,000 Units by mouth once a week.  . hyoscyamine (LEVSIN) 0.125 MG/5ML ELIX Take 0.125 mg by mouth 4 (four) times  daily.  . meclizine (ANTIVERT) 25 MG tablet Take 25 mg by mouth 3 (three) times daily as needed for dizziness.  . ondansetron (ZOFRAN) 4 MG tablet Take 4 mg by mouth every 8 (eight) hours as needed for nausea or vomiting.  . predniSONE (DELTASONE) 10 MG tablet Take 10 mg by mouth daily with breakfast.  . rosuvastatin (CRESTOR) 40 MG tablet Take 40 mg by mouth at bedtime.  . sulfaSALAzine (AZULFIDINE) 500 MG tablet Take 500  mg by mouth in the morning, at noon, in the evening, and at bedtime.  . [DISCONTINUED] alendronate (FOSAMAX) 70 MG tablet Take 70 mg by mouth once a week. Take with a full glass of water on an empty stomach.   No facility-administered encounter medications on file as of 12/26/2019.   Past medical history  Hyperlipidemia.  She has been on therapy for 7 years.  She is concerned because levels have not her satisfaction. Chronic GERD. History of leukocytosis and thrombocytosis for which she has undergone extensive evaluation including bone marrow aspiration and biopsy at [place] in [place].  No etiology discovered. Rheumatoid arthritis was diagnosed 3 years ago. Peripheral vascular disease.  He presented with carpal right second toe.  Angiography revealed occluded dorsalis pedis artery.  Her work-up was negative for thromboembolism.  Pain resolved spontaneously.  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 She had hysterectomy in 1993. Acute peritonitis in 2005 requiring emergency laparotomy with resection of segment of small bowel and ileostomy which was reversed 6 months later.  Patient had cholecystectomy at the time of surgery. She had screening colonoscopy in August 2016 revealing hyperplastic polyp. Diagnostic colonoscopy in February 2021 for hematochezia revealing pancolonic diverticulosis internal hemorrhoids and 2 small polyps removed.  1 polyp was a small leiomyoma and other polyp was hyperplastic.   Allergies  Allergies  Allergen Reactions  . Codeine     Headache-"draws line across forehead"   Family history  Father had coronary artery disease and lung carcinoma.  He died at age 75. Mother developed breast carcinoma at age 57 and few days later she developed lymphoma and lived to be 81. She has a sister age 25 with chronic stomach issues with doing fairly well. Her brother died at age 2 of stomach cancer.  Social history  Patient is  married.  She is accompanied by her husband Joe.  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  She smokes cigarettes for 40 years less than 1 pack/day and quit in 2018.  She does not drink alcohol.  Her daughter died of metastatic cervical carcinoma at age 37.  She had chronic HBV infection.  She and her husband have raised 3 adopted children and 2 of them 15.   Physical examination  Blood pressure 128/72, pulse 80, temperature 98.5 F (36.9 C), temperature source Oral, height 5' 3"  (1.6 m), weight 159 lb 11.2 oz (72.4 kg). Patient is alert and in no acute distress. Patient is wearing a mask. Conjunctiva is pink. Sclera is nonicteric Oropharyngeal mucosa is normal. No neck masses or thyromegaly noted. Cardiac exam with regular rhythm normal S1 and S2. No murmur or gallop noted. Lungs are clear to auscultation. Abdomen is full.  She has lower midline scar along with small scar in right lower quadrant site of prior ileostomy.  Bowel sounds are normal.  On palpation abdomen is soft and nontender with organomegaly or masses. No LE edema or clubbing noted.  Labs/studies  Results:   CBC Latest Ref Rng & Units 12/07/2019 11/23/2019 05/02/2019  WBC 3.4 - 10.8 x10E3/uL 17.5(H) 14.4(H) 16.6(H)  Hemoglobin 11.1 - 15.9 g/dL 13.7 12.9 12.7  Hematocrit 34.0 - 46.6 % 44.4 40.2 39.0  Platelets 150 - 450 x10E3/uL 580(H) 665(H) 676(H)    CMP Latest Ref Rng & Units 12/07/2019 11/23/2019 05/02/2019  Glucose 65 - 99 mg/dL 96 102(H) -  BUN 8 - 27 mg/dL 15 16 -  Creatinine 0.57 - 1.00 mg/dL 0.76 0.82 -  Sodium 134 - 144 mmol/L 143 139 -  Potassium 3.5 - 5.2 mmol/L 4.5 4.7 -  Chloride 96 - 106 mmol/L 103 102 -  CO2 20 - 29 mmol/L 25 22 -  Calcium 8.7 - 10.3 mg/dL 10.0 10.1 -  Total Protein 6.0 - 8.5 g/dL 7.3 7.2 6.7  Total Bilirubin 0.0 - 1.2 mg/dL <0.2 0.3 0.3  Alkaline Phos 48 - 121 IU/L 102 163(H) -  AST 0 - 40 IU/L 25 27 20   ALT 0 - 32 IU/L 37(H) 142(H) 21    Hepatic  Function Latest Ref Rng & Units 12/07/2019 11/23/2019 05/02/2019  Total Protein 6.0 - 8.5 g/dL 7.3 7.2 6.7  Albumin 3.8 - 4.8 g/dL 4.3 4.4 -  AST 0 - 40 IU/L 25 27 20   ALT 0 - 32 IU/L 37(H) 142(H) 21  Alk Phosphatase 48 - 121 IU/L 102 163(H) -  Total Bilirubin 0.0 - 1.2 mg/dL <0.2 0.3 0.3  Bilirubin, Direct 0.0 - 0.2 mg/dL - - 0.1    CT, ultrasound and MRCP results are reviewed under history of present illness. None of these images are available for review.  Assessment:  Patient is 64 year old Caucasian female who presents with intermittent right upper quadrant pain associated with nausea and vomiting.  First episode occurred about 9 months ago and last episode was about 5 weeks ago.  She has been documented to have fluctuating transaminases and  she also developed jaundice 7 weeks ago with spontaneous clearance.  CT of September 2020 and ultrasound of February 2021 failed to show cholelithiasis.  These 2 studies suggested pneumobilia hence prior sphincterotomy but no such history is given. MRCP now shows markedly dilated biliary system with filling defects consistent with stones.  She may also have a large calcified stone distally.  No history of pancreatitis. Patient will benefit from diagnostic and therapeutic ERCP. Patient's transaminases are near normal and therefore will not stop statin at this time.  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  Extensive work-up in the past negative for bone marrow neoplasm.  Rheumatoid arthritis.  Patient is on sulfasalazine.   Recommendations  ERCP with biliary sphincterotomy and stone extraction.  I reviewed the procedure risks with the patient and her husband in detail.  Bleeding pancreatitis and injury to bowel wall. They are both agreeable to proceed with the study. Patient will need to be off Plavix/clopidogrel for 5 days.  Will check with PCP. Patient will have CBC, c-Met and INR at preop  evaluation. Will request MRCP images. Patient advised to call if she has another episode of abdominal pain fever or chills.

## 2019-12-26 NOTE — Patient Instructions (Signed)
ERCP with sphincterotomy and stone extraction to be scheduled. Will need to hold Plavix/clopidogrel for 5 days prior to procedure. Will check with primary care physician.

## 2019-12-26 NOTE — Telephone Encounter (Signed)
Routing to [facility] clinical to schedule.

## 2019-12-26 NOTE — Telephone Encounter (Signed)
Please arrange for office visit with me today at 2 PM Referral from [facility] Patient has bile duct stones

## 2019-12-27 ENCOUNTER — Telehealth (INDEPENDENT_AMBULATORY_CARE_PROVIDER_SITE_OTHER): Payer: Self-pay | Admitting: *Deleted

## 2019-12-27 ENCOUNTER — Other Ambulatory Visit (INDEPENDENT_AMBULATORY_CARE_PROVIDER_SITE_OTHER): Payer: Self-pay | Admitting: *Deleted

## 2019-12-27 NOTE — Telephone Encounter (Signed)
Please see response from Dr. Laural Golden

## 2019-12-27 NOTE — Telephone Encounter (Signed)
Per Dr Marvel Plan it is ok for patient to hold Plavix 5 days prior to ERCP sch'd 01/05/20 - patient aware

## 2019-12-28 ENCOUNTER — Other Ambulatory Visit (INDEPENDENT_AMBULATORY_CARE_PROVIDER_SITE_OTHER): Payer: Self-pay | Admitting: *Deleted

## 2019-12-28 DIAGNOSIS — K8050 Calculus of bile duct without cholangitis or cholecystitis without obstruction: Secondary | ICD-10-CM

## 2020-01-03 ENCOUNTER — Encounter (HOSPITAL_COMMUNITY): Payer: Self-pay

## 2020-01-03 ENCOUNTER — Other Ambulatory Visit: Payer: Self-pay

## 2020-01-03 ENCOUNTER — Other Ambulatory Visit (HOSPITAL_COMMUNITY)
Admission: RE | Admit: 2020-01-03 | Discharge: 2020-01-03 | Disposition: A | Discharge status: Home / Self Care | Payer: Medicare Other | Source: Ambulatory Visit | Attending: Internal Medicine | Admitting: Internal Medicine

## 2020-01-03 ENCOUNTER — Encounter (HOSPITAL_COMMUNITY)
Admission: RE | Admit: 2020-01-03 | Discharge: 2020-01-03 | Disposition: A | Discharge status: Home / Self Care | Payer: Medicare Other | Source: Ambulatory Visit | Attending: Internal Medicine | Admitting: Internal Medicine

## 2020-01-03 DIAGNOSIS — Z87891 Personal history of nicotine dependence: Secondary | ICD-10-CM | POA: Insufficient documentation

## 2020-01-03 DIAGNOSIS — Z20822 Contact with and (suspected) exposure to covid-19: Secondary | ICD-10-CM | POA: Diagnosis not present

## 2020-01-03 DIAGNOSIS — Z01818 Encounter for other preprocedural examination: Secondary | ICD-10-CM | POA: Diagnosis present

## 2020-01-03 DIAGNOSIS — I739 Peripheral vascular disease, unspecified: Secondary | ICD-10-CM | POA: Insufficient documentation

## 2020-01-03 LAB — CBC WITH DIFFERENTIAL/PLATELET
Abs Immature Granulocytes: 0.11 10*3/uL — ABNORMAL HIGH (ref 0.00–0.07)
Basophils Absolute: 0.1 10*3/uL (ref 0.0–0.1)
Basophils Relative: 0 %
Eosinophils Absolute: 0.1 10*3/uL (ref 0.0–0.5)
Eosinophils Relative: 1 %
HCT: 41.7 % (ref 36.0–46.0)
Hemoglobin: 12.9 g/dL (ref 12.0–15.0)
Immature Granulocytes: 1 %
Lymphocytes Relative: 29 %
Lymphs Abs: 4.3 10*3/uL — ABNORMAL HIGH (ref 0.7–4.0)
MCH: 27.6 pg (ref 26.0–34.0)
MCHC: 30.9 g/dL (ref 30.0–36.0)
MCV: 89.1 fL (ref 80.0–100.0)
Monocytes Absolute: 1.2 10*3/uL — ABNORMAL HIGH (ref 0.1–1.0)
Monocytes Relative: 8 %
Neutro Abs: 9.1 10*3/uL — ABNORMAL HIGH (ref 1.7–7.7)
Neutrophils Relative %: 61 %
Platelets: 551 10*3/uL — ABNORMAL HIGH (ref 150–400)
RBC: 4.68 MIL/uL (ref 3.87–5.11)
RDW: 14.5 % (ref 11.5–15.5)
WBC: 14.9 10*3/uL — ABNORMAL HIGH (ref 4.0–10.5)
nRBC: 0 % (ref 0.0–0.2)

## 2020-01-03 LAB — PROTIME-INR
INR: 1 (ref 0.8–1.2)
Prothrombin Time: 12.7 seconds (ref 11.4–15.2)

## 2020-01-03 LAB — COMPREHENSIVE METABOLIC PANEL
ALT: 31 U/L (ref 0–44)
AST: 21 U/L (ref 15–41)
Albumin: 3.8 g/dL (ref 3.5–5.0)
Alkaline Phosphatase: 73 U/L (ref 38–126)
Anion gap: 10 (ref 5–15)
BUN: 15 mg/dL (ref 8–23)
CO2: 24 mmol/L (ref 22–32)
Calcium: 8.8 mg/dL — ABNORMAL LOW (ref 8.9–10.3)
Chloride: 103 mmol/L (ref 98–111)
Creatinine, Ser: 0.64 mg/dL (ref 0.44–1.00)
GFR calc Af Amer: >60 mL/min (ref >60)
GFR calc non Af Amer: >60 mL/min (ref >60)
Glucose, Bld: 153 mg/dL — ABNORMAL HIGH (ref 70–99)
Potassium: 3.4 mmol/L — ABNORMAL LOW (ref 3.5–5.1)
Sodium: 137 mmol/L (ref 135–145)
Total Bilirubin: 0.3 mg/dL (ref 0.3–1.2)
Total Protein: 7.3 g/dL (ref 6.5–8.1)

## 2020-01-03 NOTE — Patient Instructions (Signed)
Olivia [name]  01/03/2020     @PREFPERIOPPHARMACY @   Your procedure is scheduled on 01/05/2020.  Report to [facility] at 11:00 A.M.  Call this number if you have problems the morning of surgery:  [phone]   Remember:  Do not eat or drink after midnight.   Please Follow  Prep Instructions given to you by Dr Olevia Perches office.     Take these medicines the morning of surgery with A SIP OF WATER : Dexlansoprazole and Prednisone    Do not wear jewelry, make-up or nail polish.  Do not wear lotions, powders, or perfumes, or deodorant.  Do not shave 48 hours prior to surgery.  Men may shave face and neck.  Do not bring valuables to the hospital.  [facility] is not responsible for any belongings or valuables.  Contacts, dentures or bridgework may not be worn into surgery.  Leave your suitcase in the car.  After surgery it may be brought to your room.  For patients admitted to the hospital, discharge time will be determined by your treatment team.  Patients discharged the day of surgery will not be allowed to drive home.   Name and phone number of your driver:   Family Special instructions:  N/A  Please read over the following fact sheets that you were given. Care and Recovery After Surgery    Endoscopic Retrograde Cholangiopancreatogram  Endoscopic retrograde cholangiopancreatogram (ERCP) is a procedure that may be used to diagnose or treat problems with the pancreas, bile ducts, liver, and gallbladder. For this procedure, a thin, lighted tube (endoscope) is passed through the mouth, the throat, and down into the areas being checked. The endoscope has a camera that allows the areas to be viewed. Dye is injected and then X-rays are taken to further study the areas. During ERCP, other procedures may also be done to help diagnose or treat problems that are found. For example, stones can be removed, or a tissue sample can be taken out for testing (biopsy). Tell a health care provider  about:  Any allergies you have.  All medicines you are taking, including vitamins, herbs, eye drops, creams, and over-the-counter medicines.  Any problems you or family members have had with anesthetic medicines.  Any blood disorders you have.  Any surgeries you have had.  Any medical conditions you have.  Whether you are pregnant or may be pregnant. What are the risks? Generally, this is a safe procedure. However, problems may occur, including:  Pancreatitis.  Infection.  Bleeding.  Allergic reactions to medicines or dyes.  Accidental punctures in the bowel wall, pancreas, or gallbladder.  Damage to other structures or organs. What happens before the procedure? Staying hydrated Follow instructions from your health care provider about hydration, which may include:  Up to 2 hours before the procedure - you may continue to drink clear liquids, such as water, clear fruit juice, black coffee, and plain tea. Eating and drinking restrictions Follow instructions from your health care provider about eating and drinking, which may include:  8 hours before the procedure - stop eating heavy meals or foods such as meat, fried foods, or fatty foods.  6 hours before the procedure - stop eating light meals or foods, such as toast or cereal.  6 hours before the procedure - stop drinking milk or drinks that contain milk.  2 hours before the procedure - stop drinking clear liquids. General instructions  Ask your health care provider about: ? Changing or stopping your regular  medicines. This is especially important if you are taking diabetes medicines or blood thinners. ? Taking medicines such as aspirin and ibuprofen. These medicines can thin your blood. Do not take these medicines before your procedure if your health care provider instructs you not to.  Plan to have someone take you home from the hospital or clinic.  If you will be going home right after the procedure, plan to have  someone with you for 24 hours. What happens during the procedure?  To lower your risk of infection, your health care team will wash or sanitize their hands.  An IV tube will be inserted into one of your veins.  You will be given one or more of the following: ? A medicine to help you relax (sedative). ? A medicine to numb the throat area (local anesthetic) and prevent gagging. Your throat may be sprayed with this medicine, or you may gargle the medicine. ? A medicine to make you fall asleep (general anesthetic). ? A medicine to lower your risk of infection (antibiotic), inflammation (anti-inflammatory), or both.  You will lie on your left side.  The endoscope will be inserted through your mouth, down the back of the throat, and into the first part of the small intestine (duodenum).  Then a small, plastic tube (cannula) will be passed through the endoscope and directed into the bile duct or pancreatic duct.  Dye will be injected through the cannula to make structures easier to see on an X-ray.  X-rays will be taken to study the biliary and pancreatic passageways. You may be positioned on your abdomen or your back during the X-rays.  A small sample of tissue (biopsy) may be removed for examination, or other procedures may be done to fix problems that are found. The procedure may vary among health care providers and hospitals. What happens after the procedure?  Your blood pressure, heart rate, breathing rate, and blood oxygen level will be monitored until the medicines you were given have worn off.  Your throat may feel slightly sore.  You will not be allowed to eat or drink until numbness subsides.  Do not drive for 24 hours if you were given a sedative. Summary  Endoscopic retrograde cholangiopancreatogram is a procedure that may be used to diagnose or treat problems with the pancreas, bile ducts, liver, and gallbladder.  During ERCP, other procedures may also be done to help  diagnose or treat problems that are found. For example, stones can be removed, or a tissue sample can be taken out for testing (biopsy).  Generally, this is a safe procedure. However, problems may occur, including infection, bleeding, pancreatitis, accidental damage to other structures or organs, and allergic reactions to medicines or dyes.  The procedure may vary among health care providers and hospitals. This information is not intended to replace advice given to you by your health care provider. Make sure you discuss any questions you have with your health care provider. Document Revised: 06/05/2017 Document Reviewed: 05/19/2016 Elsevier Patient Education  2020 [employer].

## 2020-01-04 LAB — SARS CORONAVIRUS 2 (TAT 6-24 HRS): SARS Coronavirus 2: NEGATIVE

## 2020-01-05 ENCOUNTER — Other Ambulatory Visit: Payer: Self-pay

## 2020-01-05 ENCOUNTER — Ambulatory Visit (HOSPITAL_COMMUNITY): Payer: Medicare Other | Admitting: Anesthesiology

## 2020-01-05 ENCOUNTER — Encounter (HOSPITAL_COMMUNITY)
Admission: RE | Disposition: A | Discharge status: Home / Self Care | Payer: Self-pay | Source: Home / Self Care | Attending: Internal Medicine

## 2020-01-05 ENCOUNTER — Encounter (HOSPITAL_COMMUNITY): Payer: Self-pay | Admitting: Internal Medicine

## 2020-01-05 ENCOUNTER — Ambulatory Visit (HOSPITAL_COMMUNITY): Payer: Medicare Other

## 2020-01-05 ENCOUNTER — Ambulatory Visit (HOSPITAL_COMMUNITY)
Admission: RE | Admit: 2020-01-05 | Discharge: 2020-01-05 | Disposition: A | Discharge status: Home / Self Care | Payer: Medicare Other | Attending: Internal Medicine | Admitting: Internal Medicine

## 2020-01-05 DIAGNOSIS — Z800 Family history of malignant neoplasm of digestive organs: Secondary | ICD-10-CM | POA: Insufficient documentation

## 2020-01-05 DIAGNOSIS — D72829 Elevated white blood cell count, unspecified: Secondary | ICD-10-CM | POA: Diagnosis not present

## 2020-01-05 DIAGNOSIS — M069 Rheumatoid arthritis, unspecified: Secondary | ICD-10-CM | POA: Insufficient documentation

## 2020-01-05 DIAGNOSIS — Z79899 Other long term (current) drug therapy: Secondary | ICD-10-CM | POA: Diagnosis not present

## 2020-01-05 DIAGNOSIS — I739 Peripheral vascular disease, unspecified: Secondary | ICD-10-CM | POA: Diagnosis not present

## 2020-01-05 DIAGNOSIS — Z9049 Acquired absence of other specified parts of digestive tract: Secondary | ICD-10-CM | POA: Insufficient documentation

## 2020-01-05 DIAGNOSIS — Z803 Family history of malignant neoplasm of breast: Secondary | ICD-10-CM | POA: Diagnosis not present

## 2020-01-05 DIAGNOSIS — N200 Calculus of kidney: Secondary | ICD-10-CM | POA: Insufficient documentation

## 2020-01-05 DIAGNOSIS — K838 Other specified diseases of biliary tract: Secondary | ICD-10-CM | POA: Diagnosis not present

## 2020-01-05 DIAGNOSIS — K8051 Calculus of bile duct without cholangitis or cholecystitis with obstruction: Secondary | ICD-10-CM | POA: Insufficient documentation

## 2020-01-05 DIAGNOSIS — Z87891 Personal history of nicotine dependence: Secondary | ICD-10-CM | POA: Diagnosis not present

## 2020-01-05 DIAGNOSIS — K219 Gastro-esophageal reflux disease without esophagitis: Secondary | ICD-10-CM | POA: Diagnosis not present

## 2020-01-05 DIAGNOSIS — Z7952 Long term (current) use of systemic steroids: Secondary | ICD-10-CM | POA: Insufficient documentation

## 2020-01-05 DIAGNOSIS — D473 Essential (hemorrhagic) thrombocythemia: Secondary | ICD-10-CM | POA: Insufficient documentation

## 2020-01-05 DIAGNOSIS — Z885 Allergy status to narcotic agent status: Secondary | ICD-10-CM | POA: Insufficient documentation

## 2020-01-05 DIAGNOSIS — Z8249 Family history of ischemic heart disease and other diseases of the circulatory system: Secondary | ICD-10-CM | POA: Insufficient documentation

## 2020-01-05 DIAGNOSIS — K8050 Calculus of bile duct without cholangitis or cholecystitis without obstruction: Secondary | ICD-10-CM

## 2020-01-05 DIAGNOSIS — Z8379 Family history of other diseases of the digestive system: Secondary | ICD-10-CM | POA: Diagnosis not present

## 2020-01-05 DIAGNOSIS — K760 Fatty (change of) liver, not elsewhere classified: Secondary | ICD-10-CM | POA: Insufficient documentation

## 2020-01-05 DIAGNOSIS — Z8049 Family history of malignant neoplasm of other genital organs: Secondary | ICD-10-CM | POA: Diagnosis not present

## 2020-01-05 DIAGNOSIS — E785 Hyperlipidemia, unspecified: Secondary | ICD-10-CM | POA: Insufficient documentation

## 2020-01-05 DIAGNOSIS — Z801 Family history of malignant neoplasm of trachea, bronchus and lung: Secondary | ICD-10-CM | POA: Diagnosis not present

## 2020-01-05 DIAGNOSIS — Z8719 Personal history of other diseases of the digestive system: Secondary | ICD-10-CM | POA: Insufficient documentation

## 2020-01-05 DIAGNOSIS — Z90710 Acquired absence of both cervix and uterus: Secondary | ICD-10-CM | POA: Insufficient documentation

## 2020-01-05 DIAGNOSIS — Z8619 Personal history of other infectious and parasitic diseases: Secondary | ICD-10-CM | POA: Diagnosis not present

## 2020-01-05 DIAGNOSIS — R932 Abnormal findings on diagnostic imaging of liver and biliary tract: Secondary | ICD-10-CM

## 2020-01-05 DIAGNOSIS — Z807 Family history of other malignant neoplasms of lymphoid, hematopoietic and related tissues: Secondary | ICD-10-CM | POA: Diagnosis not present

## 2020-01-05 DIAGNOSIS — Z9889 Other specified postprocedural states: Secondary | ICD-10-CM

## 2020-01-05 HISTORY — PX: BILIARY STENT PLACEMENT: SHX5538

## 2020-01-05 HISTORY — PX: SPHINCTEROTOMY: SHX5544

## 2020-01-05 HISTORY — PX: ERCP: SHX5425

## 2020-01-05 HISTORY — PX: REMOVAL OF STONES: SHX5545

## 2020-01-05 SURGERY — ERCP, WITH INTERVENTION IF INDICATED
Anesthesia: General

## 2020-01-05 MED ORDER — GLUCAGON HCL RDNA (DIAGNOSTIC) 1 MG IJ SOLR
INTRAMUSCULAR | Status: AC
  Filled 2020-01-05: qty 2

## 2020-01-05 MED ORDER — FENTANYL CITRATE (PF) 100 MCG/2ML IJ SOLN
INTRAMUSCULAR | Status: DC | PRN
  Administered 2020-01-05 (×2): 25 ug via INTRAVENOUS

## 2020-01-05 MED ORDER — SODIUM CHLORIDE 0.9 % IV SOLN
INTRAVENOUS | Status: DC | PRN
  Administered 2020-01-05: 50 mL

## 2020-01-05 MED ORDER — PROPOFOL 10 MG/ML IV BOLUS
INTRAVENOUS | Status: AC
  Filled 2020-01-05: qty 20

## 2020-01-05 MED ORDER — FENTANYL CITRATE (PF) 100 MCG/2ML IJ SOLN
INTRAMUSCULAR | Status: AC
  Filled 2020-01-05: qty 2

## 2020-01-05 MED ORDER — WATER FOR IRRIGATION, STERILE IR SOLN
Status: DC | PRN
  Administered 2020-01-05: 1000 mL

## 2020-01-05 MED ORDER — LACTATED RINGERS IV SOLN: INTRAVENOUS | Status: DC

## 2020-01-05 MED ORDER — FENTANYL CITRATE (PF) 100 MCG/2ML IJ SOLN: 25.0000 ug | INTRAMUSCULAR | Status: DC | PRN

## 2020-01-05 MED ORDER — SUCCINYLCHOLINE CHLORIDE 20 MG/ML IJ SOLN
INTRAMUSCULAR | Status: DC | PRN
  Administered 2020-01-05: 120 mg via INTRAVENOUS

## 2020-01-05 MED ORDER — GLUCAGON HCL RDNA (DIAGNOSTIC) 1 MG IJ SOLR
INTRAMUSCULAR | Status: DC | PRN
Start: 2020-01-05 — End: 2020-01-05
  Administered 2020-01-05 (×3): .25 mg via INTRAVENOUS

## 2020-01-05 MED ORDER — GLYCOPYRROLATE 0.2 MG/ML IJ SOLN
INTRAMUSCULAR | Status: DC | PRN
  Administered 2020-01-05: .2 mg via INTRAVENOUS

## 2020-01-05 MED ORDER — ONDANSETRON HCL 4 MG/2ML IJ SOLN
INTRAMUSCULAR | Status: DC | PRN
  Administered 2020-01-05: 4 mg via INTRAVENOUS

## 2020-01-05 MED ORDER — SODIUM CHLORIDE 0.9 % IV SOLN
INTRAVENOUS | Status: AC
  Filled 2020-01-05: qty 100

## 2020-01-05 MED ORDER — CHLORHEXIDINE GLUCONATE CLOTH 2 % EX PADS: 6.0000 | MEDICATED_PAD | Freq: Once | CUTANEOUS | Status: DC

## 2020-01-05 MED ORDER — PROPOFOL 10 MG/ML IV BOLUS
INTRAVENOUS | Status: DC | PRN
  Administered 2020-01-05: 150 mg via INTRAVENOUS

## 2020-01-05 MED ORDER — SUCCINYLCHOLINE CHLORIDE 200 MG/10ML IV SOSY
PREFILLED_SYRINGE | INTRAVENOUS | Status: AC
  Filled 2020-01-05: qty 10

## 2020-01-05 MED ORDER — CEFAZOLIN SODIUM-DEXTROSE 2-4 GM/100ML-% IV SOLN
2.0000 g | INTRAVENOUS | Status: AC
  Administered 2020-01-05: 2 g via INTRAVENOUS
  Filled 2020-01-05: qty 100

## 2020-01-05 MED ORDER — ONDANSETRON HCL 4 MG/2ML IJ SOLN
INTRAMUSCULAR | Status: AC
  Filled 2020-01-05: qty 2

## 2020-01-05 MED ORDER — GLYCOPYRROLATE PF 0.2 MG/ML IJ SOSY
PREFILLED_SYRINGE | INTRAMUSCULAR | Status: AC
  Filled 2020-01-05: qty 1

## 2020-01-05 MED ORDER — ONDANSETRON HCL 4 MG/2ML IJ SOLN: 4.0000 mg | Freq: Once | INTRAMUSCULAR | Status: DC | PRN

## 2020-01-05 NOTE — Anesthesia Procedure Notes (Signed)
Procedure Name: Intubation Date/Time: 01/05/2020 1:08 PM Performed by: Vista Deck, CRNA Pre-anesthesia Checklist: Patient identified, Patient being monitored, Timeout performed, Emergency Drugs available and Suction available Patient Re-evaluated:Patient Re-evaluated prior to induction Oxygen Delivery Method: Circle system utilized Preoxygenation: Pre-oxygenation with 100% oxygen Induction Type: IV induction Laryngoscope Size: Mac and 3 Grade View: Grade II Tube type: Oral Tube size: 7.0 mm Number of attempts: 1 Airway Equipment and Method: Stylet and Bite block Placement Confirmation: ETT inserted through vocal cords under direct vision,  positive ETCO2 and breath sounds checked- equal and bilateral Secured at: 21 cm Tube secured with: Tape Dental Injury: Teeth and Oropharynx as per pre-operative assessment

## 2020-01-05 NOTE — Op Note (Signed)
[facility] Patient Name: Olivia [name] Procedure Date: 01/05/2020 12:17 PM MRN: [id number] Date of Birth: 27-Dec-1955 Attending MD: Hildred Laser , MD CSN: [id number] Age: 64 Admit Type: Inpatient Procedure:                ERCP Indications:              For therapy of bile duct stone(s) Providers:                Hildred Laser, MD, Otis Peak B. Sharon Seller, RN, Aram Candela Referring MD:             Roseanne Kaufman, NP and [place] Medicines:                General Anesthesia Complications:            No immediate complications. Estimated Blood Loss:     Estimated blood loss: none. Procedure:                Pre-Anesthesia Assessment:                           - Prior to the procedure, a History and Physical                            was performed, and patient medications and                            allergies were reviewed. The patient's tolerance of                            previous anesthesia was also reviewed. The risks                            and benefits of the procedure and the sedation                            options and risks were discussed with the patient.                            All questions were answered, and informed consent                            was obtained. Prior Anticoagulants: The patient has                            taken no previous anticoagulant or antiplatelet                            agents. ASA Grade Assessment: III - A patient with  severe systemic disease. After reviewing the risks                            and benefits, the patient was deemed in                            satisfactory condition to undergo the procedure.                           After obtaining informed consent, the scope was                            passed under direct vision. Throughout the                            procedure, the patient's blood pressure,  pulse, and                            oxygen saturations were monitored continuously. The                            [id number]-Q180V ([id number]) scope was introduced through                            the mouth, and used to inject contrast into and                            used to inject contrast into the bile duct. The                            ERCP was accomplished without difficulty. The                            patient tolerated the procedure well. Scope In: 1:21:11 PM Scope Out: 1:54:49 PM Total Procedure Duration: 0 hours 33 minutes 38 seconds  Findings:      The scout film was normal. The esophagus was successfully intubated       under direct vision. The scope was advanced to a normal major papilla in       the descending duodenum without detailed examination of the pharynx,       larynx and associated structures, and upper GI tract. The upper GI tract       was grossly normal. 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       The bile duct was deeply cannulated with the Hydratome sphincterotome.       Contrast was injected. I personally interpreted the bile duct images.       Ductal flow of contrast was adequate. Image quality was excellent.       Contrast extended to the entire biliary tree. The common bile duct and       common hepatic duct were severely dilated, acquired. The largest       diameter was 20 mm. The common hepatic duct contained filling defect(s)       thought to be a stone. A 10 mm biliary sphincterotomy was made with a  braided Hydratome sphincterotome using ERBE electrocautery. There was no       post-sphincterotomy bleeding. The lower third of the main bile duct       contained a single localized stenosis. To discover objects, the biliary       tree was swept with a 12 mm balloon starting at the bifurcation. Sludge       was swept from the duct. One 10 Fr by 7 cm plastic stent with a single       external flap and a single internal flap was placed 7  cm into the common       bile duct. Fluid and bile flowed through the stent. The stent was in       good position. 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                            Erosions noted to mouth of diverticulum.                           - A filling defect consistent with a stone was seen                            on the cholangiogram.                           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 The stricture was                            indeterminate.                           - The common bile duct and common hepatic duct were                            severely dilated, acquired.                           - A sphincterotomy was performed.                           - The biliary tree was swept and sludge was found.                           - One plastic stent was placed into the common bile                            duct. Moderate Sedation:      Per Anesthesia Care Recommendation:           - Discharge patient to home (with spouse).                           -  Avoid aspirin and nonsteroidal anti-inflammatory                            medicines for 3 days.                           - Continue present medications.                           - Clear liquid diet today. Usual diet starting                            tomorrow.                           - CA19-9.                           - Consider EUS with next ERCP. Procedure Code(s):        --- Professional ---                           [phone], Endoscopic retrograde                            cholangiopancreatography (ERCP); with placement of                            endoscopic stent into biliary or pancreatic duct,                            including pre- and post-dilation and guide wire                            passage, when performed, including sphincterotomy,                            when performed, each  stent                           [id number], Endoscopic retrograde                            cholangiopancreatography (ERCP); with removal of                            calculi/debris from biliary/pancreatic duct(s) Diagnosis Code(s):        --- Professional ---                           K80.51, Calculus of bile duct without cholangitis                            or cholecystitis with obstruction                           K83.8, Other specified diseases of biliary tract  R93.2, Abnormal findings on diagnostic imaging of                            liver and biliary tract CPT copyright 2019 American [employer]. All rights reserved. The codes documented in this report are preliminary and upon coder review may  be revised to meet current compliance requirements. Hildred Laser, MD Hildred Laser, MD 01/05/2020 2:24:10 PM This report has been signed electronically. Number of Addenda: 0

## 2020-01-05 NOTE — Anesthesia Postprocedure Evaluation (Signed)
Anesthesia Post Note  Patient: Olivia [name]  Procedure(s) Performed: ENDOSCOPIC RETROGRADE CHOLANGIOPANCREATOGRAPHY (ERCP) (N/A ) SPHINCTEROTOMY (N/A ) REMOVAL OF STONES (N/A ) BILIARY STENT PLACEMENT  Patient location during evaluation: PACU Anesthesia Type: General Level of consciousness: awake and alert and patient cooperative Pain management: satisfactory to patient Vital Signs Assessment: post-procedure vital signs reviewed and stable Respiratory status: spontaneous breathing Postop Assessment: no apparent nausea or vomiting Anesthetic complications: no   No complications documented.   Last Vitals:  Vitals:   01/05/20 1430 01/05/20 1445  BP: (!) 143/64 (!) 145/127  Pulse: 77 76  Resp: 15 18  Temp:    SpO2: 97% 96%    Last Pain:  Vitals:   01/05/20 1430  TempSrc:   PainSc: (P) 0-No pain                 [name]

## 2020-01-05 NOTE — Transfer of Care (Signed)
Immediate Anesthesia Transfer of Care Note  Patient: Latash Nouri  Procedure(s) Performed: ENDOSCOPIC RETROGRADE CHOLANGIOPANCREATOGRAPHY (ERCP) (N/A ) SPHINCTEROTOMY (N/A ) REMOVAL OF STONES (N/A ) BILIARY STENT PLACEMENT  Patient Location: PACU  Anesthesia Type:General  Level of Consciousness: awake and patient cooperative  Airway & Oxygen Therapy: Patient Spontanous Breathing and Patient connected to nasal cannula oxygen  Post-op Assessment: Report given to RN and Post -op Vital signs reviewed and stable  Post vital signs: Reviewed and stable  Last Vitals:  Vitals Value Taken Time  BP    Temp 98.2   Pulse    Resp    SpO2      Last Pain:  Vitals:   01/05/20 1112  TempSrc: Oral  PainSc: 5       Patients Stated Pain Goal: 3 (15/18/34 3735)  Complications: No complications documented.

## 2020-01-05 NOTE — Discharge Instructions (Addendum)
General Anesthesia, Adult, Care After This sheet gives you information about how to care for yourself after your procedure. Your health care provider may also give you more specific instructions. If you have problems or questions, contact your health care provider. What can I expect after the procedure? After the procedure, the following side effects are common:  Pain or discomfort at the IV site.  Nausea.  Vomiting.  Sore throat.  Trouble concentrating.  Feeling cold or chills.  Weak or tired.  Sleepiness and fatigue.  Soreness and body aches. These side effects can affect parts of the body that were not involved in surgery. Follow these instructions at home:  For at least 24 hours after the procedure:  Have a responsible adult stay with you. It is important to have someone help care for you until you are awake and alert.  Rest as needed.  Do not: ? Participate in activities in which you could fall or become injured. ? Drive. ? Use heavy machinery. ? Drink alcohol. ? Take sleeping pills or medicines that cause drowsiness. ? Make important decisions or sign legal documents. ? Take care of children on your own. Eating and drinking  Follow any instructions from your health care provider about eating or drinking restrictions.  When you feel hungry, start by eating small amounts of foods that are soft and easy to digest (bland), such as toast. Gradually return to your regular diet.  Drink enough fluid to keep your urine pale yellow.  If you vomit, rehydrate by drinking water, juice, or clear broth. General instructions  If you have sleep apnea, surgery and certain medicines can increase your risk for breathing problems. Follow instructions from your health care provider about wearing your sleep device: ? Anytime you are sleeping, including during daytime naps. ? While taking prescription pain medicines, sleeping medicines, or medicines that make you drowsy.  Return to  your normal activities as told by your health care provider. Ask your health care provider what activities are safe for you.  Take over-the-counter and prescription medicines only as told by your health care provider.  If you smoke, do not smoke without supervision.  Keep all follow-up visits as told by your health care provider. This is important. Contact a health care provider if:  You have nausea or vomiting that does not get better with medicine.  You cannot eat or drink without vomiting.  You have pain that does not get better with medicine.  You are unable to pass urine.  You develop a skin rash.  You have a fever.  You have redness around your IV site that gets worse. Get help right away if:  You have difficulty breathing.  You have chest pain.  You have blood in your urine or stool, or you vomit blood. Summary  After the procedure, it is common to have a sore throat or nausea. It is also common to feel tired.  Have a responsible adult stay with you for the first 24 hours after general anesthesia. It is important to have someone help care for you until you are awake and alert.  When you feel hungry, start by eating small amounts of foods that are soft and easy to digest (bland), such as toast. Gradually return to your regular diet.  Drink enough fluid to keep your urine pale yellow.  Return to your normal activities as told by your health care provider. Ask your health care provider what activities are safe for you. This information is not   intended to replace advice given to you by your health care provider. Make sure you discuss any questions you have with your health care provider. Document Revised: 06/26/2017 Document Reviewed: 02/06/2017 Elsevier Patient Education  [place]. Endoscopic Retrograde Cholangiopancreatogram, Care After This sheet gives you information about how to care for yourself after your procedure. Your health care provider may also  give you more specific instructions. If you have problems or questions, contact your health care provider. What can I expect after the procedure? After the procedure, it is common to have:  Soreness in your throat.  Nausea.  Bloating.  Dizziness.  Tiredness (fatigue). Follow these instructions at home:   Take over-the-counter and prescription medicines only as told by your health care provider.  Do not drive for 24 hours if you were given a medicine to help you relax (sedative) during your procedure. Have someone stay with you for 24 hours after the procedure.  Return to your normal activities as told by your health care provider. Ask your health care provider what activities are safe for you.  Return to eating what you normally do as soon as you feel well enough or as told by your health care provider.  Keep all follow-up visits as told by your health care provider. This is important. Contact a health care provider if:  You have pain in your abdomen that does not get better with medicine.  You develop signs of infection, such as: ? Chills. ? Feeling unwell. Get help right away if:  You have difficulty swallowing.  You have worsening pain in your throat, chest, or abdomen.  You vomit bright red blood or a substance that looks like coffee grounds.  You have bloody or very black stools.  You have a fever.  You have a sudden increase in swelling (bloating) in your abdomen. Summary  After the procedure, it is common to feel tired and to have some discomfort in your throat.  Contact your health care provider if you have signs of infection--such as chills or feeling unwell--or if you have pain that does not improve with medicine.  Get help right away if you have trouble swallowing, worsening pain, bloody or black vomit, bloody or black stools, a fever, or increased swelling in your abdomen.  Keep all follow-up visits as told by your health care provider. This is  important. This information is not intended to replace advice given to you by your health care provider. Make sure you discuss any questions you have with your health care provider. Document Revised: 06/05/2017 Document Reviewed: 05/12/2016 Elsevier Patient Education  2020 [place].   Biliary Colic, Adult  Biliary colic is severe pain caused by a problem with a small organ in the upper right part of your belly (gallbladder). The gallbladder stores a digestive fluid produced in the liver (bile) that helps the body break down fat. Bile and other digestive enzymes are carried from the liver to the small intestine through tube-like structures (bile ducts). The gallbladder and the bile ducts form the biliary tract. Sometimes hard deposits of digestive fluids form in the gallbladder (gallstones) and block the flow of bile from the gallbladder, causing biliary colic. This condition is also called a gallbladder attack. Gallstones can be as small as a grain of sand or as big as a golf ball. There could be just one gallstone in the gallbladder, or there could be many. What are the causes? Biliary colic is usually caused by gallstones. Less often, a tumor  could block the flow of bile from the gallbladder and trigger biliary colic. What increases the risk? This condition is more likely to develop in:  Women.  People of Hispanic descent.  People with a family history of gallstones.  People who are obese.  People who suddenly or quickly lose weight.  People who eat a high-calorie, low-fiber diet that is rich in refined carbs (carbohydrates), such as white bread and white rice.  People who have an intestinal disease that affects nutrient absorption, such as Crohn disease.  People who have a metabolic condition, such as metabolic syndrome or diabetes. What are the signs or symptoms? Severe pain in the upper right side of the belly is the main symptom of biliary colic. You may feel this pain below  the chest but above the hip. This pain often occurs at night or after eating a very fatty meal. This pain may get worse for up to an hour and last as long as 12 hours. In most cases, the pain fades (subsides) within a couple hours. Other symptoms of this condition include:  Nausea and vomiting.  Pain under the right shoulder. How is this diagnosed? This condition is diagnosed based on your medical history, your symptoms, and a physical exam. You may have tests, including:  Blood tests to rule out infection or inflammation of the bile ducts, gallbladder, pancreas, or liver.  Imaging studies such as: ? Ultrasound. ? CT scan. ? MRI. In some cases, you may need to have an imaging study done using a small amount of radioactive material (nuclear medicine) to confirm the diagnosis. How is this treated? Treatment for this condition may include medicine to relieve your pain or nausea. If you have gallstones that are causing biliary colic, you may need surgery to remove the gallbladder (cholecystectomy). Gallstones can also be dissolved gradually with medicine. It may take months or years before the gallstones are completely gone. Follow these instructions at home:  Take over-the-counter and prescription medicines only as told by your health care provider.  Drink enough fluid to keep your urine clear or pale yellow.  Follow instructions from your health care provider about eating or drinking restrictions. These may include avoiding: ? Fatty, greasy, and fried foods. ? Any foods that make the pain worse. ? Overeating. ? Having a large meal after not eating for a while.  Keep all follow-up visits as told by your health care provider. This is important. How is this prevented? Steps to prevent this condition include:  Maintaining a healthy body weight.  Getting regular exercise.  Eating a healthy, high-fiber, low-fat diet.  Limiting how much sugar and refined carbs you eat, such as sweets,  white flour, and white rice. Contact a health care provider if:  Your pain lasts more than 5 hours.  You vomit.  You have a fever and chills.  Your pain gets worse. Get help right away if:  Your skin or the whites of your eyes look yellow (jaundice).  Your have tea-colored urine and light-colored stools.  You are dizzy or you faint. Summary  Biliary colic is severe pain caused by a problem with a small organ in the upper right part of your belly (gallbladder).  Treatments for this condition include medicines that relieves your pain or nausea and medicines that slowly dissolves the gallstones.  If gallstones cause your biliary colic, the treatment is surgery to remove the gallbladder (cholecystectomy). This information is not intended to replace advice given to you by your health  care provider. Make sure you discuss any questions you have with your health care provider. Document Revised: 06/05/2017 Document Reviewed: 01/07/2016 Elsevier Patient Education  [place]. No aspirin or NSAIDs for 72 hours. Clear liquids today.  Usual diet starting tomorrow morning. Resume usual medications as before. No driving for 24 hours. Physician will call with results of blood test.

## 2020-01-05 NOTE — Anesthesia Preprocedure Evaluation (Signed)
Anesthesia Evaluation  Patient identified by MRN, date of birth, ID band Patient awake    Reviewed: Allergy & Precautions, H&P , NPO status , Patient's Chart, lab work & pertinent test results, reviewed documented beta blocker date and time   History of Anesthesia Complications (+) PONV and history of anesthetic complications  Airway Mallampati: II  TM Distance: >3 FB Neck ROM: full    Dental no notable dental hx. (+) Teeth Intact   Pulmonary neg pulmonary ROS, former smoker,    Pulmonary exam normal breath sounds clear to auscultation       Cardiovascular Exercise Tolerance: Good + Peripheral Vascular Disease   Rhythm:regular Rate:Normal     Neuro/Psych negative neurological ROS  negative psych ROS   GI/Hepatic negative GI ROS, Neg liver ROS,   Endo/Other  negative endocrine ROS  Renal/GU negative Renal ROS  negative genitourinary   Musculoskeletal   Abdominal   Peds  Hematology negative hematology ROS (+)   Anesthesia Other Findings   Reproductive/Obstetrics negative OB ROS                             Anesthesia Physical Anesthesia Plan  ASA: III  Anesthesia Plan: General   Post-op Pain Management:    Induction:   PONV Risk Score and Plan: 3 and Ondansetron  Airway Management Planned:   Additional Equipment:   Intra-op Plan:   Post-operative Plan:   Informed Consent: I have reviewed the patients History and Physical, chart, labs and discussed the procedure including the risks, benefits and alternatives for the proposed anesthesia with the patient or authorized representative who has indicated his/her understanding and acceptance.     Dental Advisory Given  Plan Discussed with: CRNA  Anesthesia Plan Comments:         Anesthesia Quick Evaluation

## 2020-01-05 NOTE — Progress Notes (Signed)
Brief ERCP note.  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 Normal ampulla water. 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 Single filling defect. Distal CBD stricture. Sphincterotomy performed and 10 French 7 cm plastic stent placed.

## 2020-01-05 NOTE — Interval H&P Note (Signed)
Patient says she has not experienced epigastric or right upper quadrant abdominal pain since she was seen in the office last week.  She also has not had nausea or vomiting.  She says she woke up 5:00 this morning and had 3 explosive bowel movements.  She had urgency.  She did not experience melena or rectal bleeding.  She says she has a history of IBS and may have an episode twice a month and these episodes occurred at different times. Patient's examination is unchanged. Abdomen is soft without tenderness organomegaly or masses. Patient's questions answered. I told her she possibly would need mechanical lithotripsy if stone is indeed 18 mm.  If stone cannot be removed would consider biliary stenting. Regarding her IBS symptoms we will send a prescription for hyoscyamine sublingual to be used on as-needed basis.  History and Physical Interval Note:  01/05/2020 12:26 PM  Olivia [name]  has presented today for surgery, with the diagnosis of common bile duct stone.  The various methods of treatment have been discussed with the patient and family. After consideration of risks, benefits and other options for treatment, the patient has consented to  Procedure(s) with comments: ENDOSCOPIC RETROGRADE CHOLANGIOPANCREATOGRAPHY (ERCP) (N/A) - 1225 SPHINCTEROTOMY (N/A) REMOVAL OF STONES (N/A) as a surgical intervention.  The patient's history has been reviewed, patient examined, no change in status, stable for surgery.  I have reviewed the patient's chart and labs.  Questions were answered to the patient's satisfaction.     [employer]

## 2020-01-11 ENCOUNTER — Other Ambulatory Visit (INDEPENDENT_AMBULATORY_CARE_PROVIDER_SITE_OTHER): Payer: Self-pay | Admitting: Internal Medicine

## 2020-01-11 MED ORDER — DICYCLOMINE HCL 10 MG PO CAPS: 10.0000 mg | ORAL_CAPSULE | Freq: Two times a day (BID) | ORAL | 1 refills | Status: DC

## 2020-01-11 NOTE — Telephone Encounter (Signed)
Patient's call returned. She is having diarrhea.  Patient has history of IBS. She says Levsin prescription was not filled.  There was some confusion. Levsin elixir removed from history of her medications Prescription for dicyclomine 10 mg twice daily sent to patient's pharmacy.  Patient advised to try taking 1 before breakfast and see how she does.  She can take second dose if necessary. I will be reaching out to patient again when CT and MRCP films available for review.

## 2020-01-12 ENCOUNTER — Encounter (HOSPITAL_COMMUNITY): Payer: Self-pay | Admitting: Internal Medicine

## 2020-01-19 ENCOUNTER — Ambulatory Visit
Admission: RE | Admit: 2020-01-19 | Discharge: 2020-01-19 | Disposition: A | Discharge status: Home / Self Care | Payer: Self-pay | Source: Ambulatory Visit | Attending: Internal Medicine | Admitting: Internal Medicine

## 2020-01-19 ENCOUNTER — Other Ambulatory Visit (HOSPITAL_COMMUNITY): Payer: Self-pay | Admitting: Internal Medicine

## 2020-01-19 DIAGNOSIS — R109 Unspecified abdominal pain: Secondary | ICD-10-CM

## 2020-01-20 ENCOUNTER — Telehealth (INDEPENDENT_AMBULATORY_CARE_PROVIDER_SITE_OTHER): Payer: Self-pay | Admitting: Internal Medicine

## 2020-01-20 NOTE — Telephone Encounter (Signed)
Patient called regarding test results - please advise - ph# [phone]

## 2020-01-22 NOTE — Telephone Encounter (Signed)
I went over results of CT and MR CP with the patient yesterday These images have been scanned into epic. No choledocholithiasis on MRCP. He has pneumobilia indicative of prior sphincterotomy but patient has no recollection. Request records of surgery from [place] from 2005.

## 2020-01-23 NOTE — Telephone Encounter (Signed)
Spoke to patient, surgeries were done at [facility], requests have been faxed

## 2020-01-23 NOTE — Telephone Encounter (Signed)
Left message for patient to call so I can get more info

## 2020-01-30 ENCOUNTER — Telehealth (INDEPENDENT_AMBULATORY_CARE_PROVIDER_SITE_OTHER): Payer: Self-pay | Admitting: Internal Medicine

## 2020-01-30 NOTE — Telephone Encounter (Signed)
Patient called wanted to let Dr Laural Golden know she is getting ready to start iron infusions - she hopes this will not mess up anything - please advise - ph# [phone]

## 2020-01-31 NOTE — Telephone Encounter (Signed)
May I ask that you call this patient and share with her Dr.Rehman's response to her question.

## 2020-01-31 NOTE — Telephone Encounter (Signed)
Called and informed pt that she go head with iron infusions, per Dr.Rehman.Pt j ust wanted to confirm this before going forward.

## 2020-01-31 NOTE — Telephone Encounter (Signed)
Dr.Rehman made aware . He says that this will not cause any problems. Patient will be made aware.

## 2020-02-14 ENCOUNTER — Ambulatory Visit: Payer: Medicare Other | Admitting: Gastroenterology

## 2020-03-19 ENCOUNTER — Telehealth (INDEPENDENT_AMBULATORY_CARE_PROVIDER_SITE_OTHER): Payer: Self-pay | Admitting: Internal Medicine

## 2020-03-19 NOTE — Telephone Encounter (Signed)
I called patient to let her know that I had received records from her surgery back in October 2004 at [facility] in [place].  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  She had bowel resection and anastomosis and ileostomy which was reversed at a later date. No records of biliary intervention.  Patient had ERCP by me on 01/05/2020 for choledocholithiasis.  Her bile duct was dilated but there were no stones.  She had distal bile duct stricture.  Therefore biliary stent was left in place. Patient states she has not had any episodes of nausea vomiting fever.  She still has some pain with meals.  Patient will return for ERCP with stent removal and/or change depending on findings. We will try to schedule it on 04/25/2020.

## 2020-03-19 NOTE — Telephone Encounter (Signed)
Dr Laural Golden, unfortunately, your propofol days are full through October. I can schedule it 05/09/20, this will be 1st Wednesday in November

## 2020-04-17 ENCOUNTER — Encounter (INDEPENDENT_AMBULATORY_CARE_PROVIDER_SITE_OTHER): Payer: Self-pay | Admitting: *Deleted

## 2020-05-14 ENCOUNTER — Other Ambulatory Visit (INDEPENDENT_AMBULATORY_CARE_PROVIDER_SITE_OTHER): Payer: Self-pay

## 2020-05-14 DIAGNOSIS — K8050 Calculus of bile duct without cholangitis or cholecystitis without obstruction: Secondary | ICD-10-CM

## 2020-05-14 NOTE — Patient Instructions (Signed)
Olivia [name]  05/14/2020     @PREFPERIOPPHARMACY @   Your procedure is scheduled on  05/16/2020.  Report to [facility] at  [place].M.  Call this number if you have problems the morning of surgery:  [phone]   Remember:  Follow the diet instructions given to you by the office.                      Take these medicines the morning of surgery with A SIP OF WATER  Dexlansoprazole, antivert(if needed), reglan, zofran(if needed), sulfasalazine.    Do not wear jewelry, make-up or nail polish.  Do not wear lotions, powders, or perfumes. Please wear deodorant and brush  Your teeth.  Do not shave 48 hours prior to surgery.  Men may shave face and neck.  Do not bring valuables to the hospital.  [facility] is not responsible for any belongings or valuables.  Contacts, dentures or bridgework may not be worn into surgery.  Leave your suitcase in the car.  After surgery it may be brought to your room.  For patients admitted to the hospital, discharge time will be determined by your treatment team.  Patients discharged the day of surgery will not be allowed to drive home.   Name and phone number of your driver:   family Special instructions:  DO NOT smoke the morning of your surgery.  Please read over the following fact sheets that you were given. Anesthesia Post-op Instructions and Care and Recovery After Surgery       Endoscopic Retrograde Cholangiopancreatogram, Care After This sheet gives you information about how to care for yourself after your procedure. Your health care provider may also give you more specific instructions. If you have problems or questions, contact your health care provider. What can I expect after the procedure? After the procedure, it is common to have:  Soreness in your throat.  Nausea.  Bloating.  Dizziness.  Tiredness (fatigue). Follow these instructions at home:   Take over-the-counter and prescription medicines only as told by your  health care provider.  Do not drive for 24 hours if you were given a medicine to help you relax (sedative) during your procedure. Have someone stay with you for 24 hours after the procedure.  Return to your normal activities as told by your health care provider. Ask your health care provider what activities are safe for you.  Return to eating what you normally do as soon as you feel well enough or as told by your health care provider.  Keep all follow-up visits as told by your health care provider. This is important. Contact a health care provider if:  You have pain in your abdomen that does not get better with medicine.  You develop signs of infection, such as: ? Chills. ? Feeling unwell. Get help right away if:  You have difficulty swallowing.  You have worsening pain in your throat, chest, or abdomen.  You vomit bright red blood or a substance that looks like coffee grounds.  You have bloody or very black stools.  You have a fever.  You have a sudden increase in swelling (bloating) in your abdomen. Summary  After the procedure, it is common to feel tired and to have some discomfort in your throat.  Contact your health care provider if you have signs of infection--such as chills or feeling unwell--or if you have pain that does not improve with medicine.  Get help  right away if you have trouble swallowing, worsening pain, bloody or black vomit, bloody or black stools, a fever, or increased swelling in your abdomen.  Keep all follow-up visits as told by your health care provider. This is important. This information is not intended to replace advice given to you by your health care provider. Make sure you discuss any questions you have with your health care provider. Document Revised: 06/05/2017 Document Reviewed: 05/12/2016 Elsevier Patient Education  2020 [place] Anesthesia, Adult, Care After This sheet gives you information about how to care for yourself  after your procedure. Your health care provider may also give you more specific instructions. If you have problems or questions, contact your health care provider. What can I expect after the procedure? After the procedure, the following side effects are common:  Pain or discomfort at the IV site.  Nausea.  Vomiting.  Sore throat.  Trouble concentrating.  Feeling cold or chills.  Weak or tired.  Sleepiness and fatigue.  Soreness and body aches. These side effects can affect parts of the body that were not involved in surgery. Follow these instructions at home:  For at least 24 hours after the procedure:  Have a responsible adult stay with you. It is important to have someone help care for you until you are awake and alert.  Rest as needed.  Do not: ? Participate in activities in which you could fall or become injured. ? Drive. ? Use heavy machinery. ? Drink alcohol. ? Take sleeping pills or medicines that cause drowsiness. ? Make important decisions or sign legal documents. ? Take care of children on your own. Eating and drinking  Follow any instructions from your health care provider about eating or drinking restrictions.  When you feel hungry, start by eating small amounts of foods that are soft and easy to digest (bland), such as toast. Gradually return to your regular diet.  Drink enough fluid to keep your urine pale yellow.  If you vomit, rehydrate by drinking water, juice, or clear broth. General instructions  If you have sleep apnea, surgery and certain medicines can increase your risk for breathing problems. Follow instructions from your health care provider about wearing your sleep device: ? Anytime you are sleeping, including during daytime naps. ? While taking prescription pain medicines, sleeping medicines, or medicines that make you drowsy.  Return to your normal activities as told by your health care provider. Ask your health care provider what  activities are safe for you.  Take over-the-counter and prescription medicines only as told by your health care provider.  If you smoke, do not smoke without supervision.  Keep all follow-up visits as told by your health care provider. This is important. Contact a health care provider if:  You have nausea or vomiting that does not get better with medicine.  You cannot eat or drink without vomiting.  You have pain that does not get better with medicine.  You are unable to pass urine.  You develop a skin rash.  You have a fever.  You have redness around your IV site that gets worse. Get help right away if:  You have difficulty breathing.  You have chest pain.  You have blood in your urine or stool, or you vomit blood. Summary  After the procedure, it is common to have a sore throat or nausea. It is also common to feel tired.  Have a responsible adult stay with you for the first 24 hours after general anesthesia.  It is important to have someone help care for you until you are awake and alert.  When you feel hungry, start by eating small amounts of foods that are soft and easy to digest (bland), such as toast. Gradually return to your regular diet.  Drink enough fluid to keep your urine pale yellow.  Return to your normal activities as told by your health care provider. Ask your health care provider what activities are safe for you. This information is not intended to replace advice given to you by your health care provider. Make sure you discuss any questions you have with your health care provider. Document Revised: 06/26/2017 Document Reviewed: 02/06/2017 Elsevier Patient Education  [place].

## 2020-05-15 ENCOUNTER — Encounter (HOSPITAL_COMMUNITY): Payer: Self-pay

## 2020-05-15 ENCOUNTER — Encounter (HOSPITAL_COMMUNITY)
Admission: RE | Admit: 2020-05-15 | Discharge: 2020-05-15 | Disposition: A | Discharge status: Home / Self Care | Payer: Medicare Other | Source: Ambulatory Visit | Attending: Internal Medicine | Admitting: Internal Medicine

## 2020-05-15 ENCOUNTER — Other Ambulatory Visit (HOSPITAL_COMMUNITY)
Admission: RE | Admit: 2020-05-15 | Discharge: 2020-05-15 | Disposition: A | Discharge status: Home / Self Care | Payer: Medicare Other | Source: Ambulatory Visit | Attending: Internal Medicine | Admitting: Internal Medicine

## 2020-05-15 ENCOUNTER — Other Ambulatory Visit: Payer: Self-pay

## 2020-05-15 DIAGNOSIS — Z20822 Contact with and (suspected) exposure to covid-19: Secondary | ICD-10-CM | POA: Diagnosis not present

## 2020-05-15 DIAGNOSIS — Z01812 Encounter for preprocedural laboratory examination: Secondary | ICD-10-CM | POA: Insufficient documentation

## 2020-05-15 DIAGNOSIS — K8050 Calculus of bile duct without cholangitis or cholecystitis without obstruction: Secondary | ICD-10-CM

## 2020-05-15 LAB — SARS CORONAVIRUS 2 (TAT 6-24 HRS): SARS Coronavirus 2: NEGATIVE

## 2020-05-16 ENCOUNTER — Ambulatory Visit (HOSPITAL_COMMUNITY)
Admission: RE | Admit: 2020-05-16 | Discharge: 2020-05-16 | Disposition: A | Discharge status: Home / Self Care | Payer: Medicare Other | Attending: Internal Medicine | Admitting: Internal Medicine

## 2020-05-16 ENCOUNTER — Other Ambulatory Visit: Payer: Self-pay

## 2020-05-16 ENCOUNTER — Encounter (HOSPITAL_COMMUNITY)
Admission: RE | Disposition: A | Discharge status: Home / Self Care | Payer: Self-pay | Source: Home / Self Care | Attending: Internal Medicine

## 2020-05-16 ENCOUNTER — Ambulatory Visit (HOSPITAL_COMMUNITY): Payer: Medicare Other | Admitting: Anesthesiology

## 2020-05-16 ENCOUNTER — Encounter (HOSPITAL_COMMUNITY): Payer: Self-pay | Admitting: Internal Medicine

## 2020-05-16 ENCOUNTER — Ambulatory Visit (HOSPITAL_COMMUNITY): Payer: Medicare Other

## 2020-05-16 DIAGNOSIS — Z7902 Long term (current) use of antithrombotics/antiplatelets: Secondary | ICD-10-CM | POA: Diagnosis not present

## 2020-05-16 DIAGNOSIS — Y831 Surgical operation with implant of artificial internal device as the cause of abnormal reaction of the patient, or of later complication, without mention of misadventure at the time of the procedure: Secondary | ICD-10-CM | POA: Diagnosis not present

## 2020-05-16 DIAGNOSIS — T85590A Other mechanical complication of bile duct prosthesis, initial encounter: Secondary | ICD-10-CM | POA: Insufficient documentation

## 2020-05-16 DIAGNOSIS — Z79899 Other long term (current) drug therapy: Secondary | ICD-10-CM | POA: Diagnosis not present

## 2020-05-16 DIAGNOSIS — Z7983 Long term (current) use of bisphosphonates: Secondary | ICD-10-CM | POA: Diagnosis not present

## 2020-05-16 DIAGNOSIS — K839 Disease of biliary tract, unspecified: Secondary | ICD-10-CM

## 2020-05-16 DIAGNOSIS — Z87891 Personal history of nicotine dependence: Secondary | ICD-10-CM | POA: Insufficient documentation

## 2020-05-16 DIAGNOSIS — K831 Obstruction of bile duct: Secondary | ICD-10-CM | POA: Insufficient documentation

## 2020-05-16 DIAGNOSIS — Z7952 Long term (current) use of systemic steroids: Secondary | ICD-10-CM | POA: Diagnosis not present

## 2020-05-16 DIAGNOSIS — K8050 Calculus of bile duct without cholangitis or cholecystitis without obstruction: Secondary | ICD-10-CM

## 2020-05-16 HISTORY — PX: ERCP: SHX5425

## 2020-05-16 HISTORY — PX: BILIARY STENT PLACEMENT: SHX5538

## 2020-05-16 HISTORY — PX: GASTROINTESTINAL STENT REMOVAL: SHX6384

## 2020-05-16 HISTORY — PX: BILIARY BRUSHING: SHX6843

## 2020-05-16 HISTORY — PX: SPHINCTEROTOMY: SHX5544

## 2020-05-16 LAB — HEPATIC FUNCTION PANEL
ALT: 42 U/L (ref 0–44)
AST: 26 U/L (ref 15–41)
Albumin: 3.8 g/dL (ref 3.5–5.0)
Alkaline Phosphatase: 65 U/L (ref 38–126)
Bilirubin, Direct: 0.1 mg/dL (ref 0.0–0.2)
Indirect Bilirubin: 0.5 mg/dL (ref 0.3–0.9)
Total Bilirubin: 0.6 mg/dL (ref 0.3–1.2)
Total Protein: 7.1 g/dL (ref 6.5–8.1)

## 2020-05-16 SURGERY — ERCP, WITH INTERVENTION IF INDICATED
Anesthesia: General

## 2020-05-16 MED ORDER — STERILE WATER FOR IRRIGATION IR SOLN
Status: DC | PRN
  Administered 2020-05-16: 1000 mL

## 2020-05-16 MED ORDER — MIDAZOLAM HCL 5 MG/5ML IJ SOLN
INTRAMUSCULAR | Status: DC | PRN
  Administered 2020-05-16: 2 mg via INTRAVENOUS

## 2020-05-16 MED ORDER — PROPOFOL 10 MG/ML IV BOLUS
INTRAVENOUS | Status: DC | PRN
  Administered 2020-05-16: 150 mg via INTRAVENOUS

## 2020-05-16 MED ORDER — CEFAZOLIN SODIUM-DEXTROSE 2-4 GM/100ML-% IV SOLN
2.0000 g | INTRAVENOUS | Status: AC
  Administered 2020-05-16: 2 g via INTRAVENOUS

## 2020-05-16 MED ORDER — GLUCAGON HCL RDNA (DIAGNOSTIC) 1 MG IJ SOLR
INTRAMUSCULAR | Status: DC | PRN
  Administered 2020-05-16: .25 mg via INTRAVENOUS

## 2020-05-16 MED ORDER — FENTANYL CITRATE (PF) 100 MCG/2ML IJ SOLN
INTRAMUSCULAR | Status: DC | PRN
  Administered 2020-05-16: 100 ug via INTRAVENOUS

## 2020-05-16 MED ORDER — GLUCAGON HCL RDNA (DIAGNOSTIC) 1 MG IJ SOLR
INTRAMUSCULAR | Status: AC
  Filled 2020-05-16: qty 1

## 2020-05-16 MED ORDER — FENTANYL CITRATE (PF) 100 MCG/2ML IJ SOLN
INTRAMUSCULAR | Status: AC
  Filled 2020-05-16: qty 2

## 2020-05-16 MED ORDER — ROCURONIUM BROMIDE 10 MG/ML (PF) SYRINGE
PREFILLED_SYRINGE | INTRAVENOUS | Status: AC
  Filled 2020-05-16: qty 10

## 2020-05-16 MED ORDER — CHLORHEXIDINE GLUCONATE 0.12 % MT SOLN
15.0000 mL | Freq: Once | OROMUCOSAL | Status: AC
  Administered 2020-05-16: 15 mL via OROMUCOSAL

## 2020-05-16 MED ORDER — MEPERIDINE HCL 50 MG/ML IJ SOLN: 6.2500 mg | INTRAMUSCULAR | Status: DC | PRN

## 2020-05-16 MED ORDER — FENTANYL CITRATE (PF) 100 MCG/2ML IJ SOLN: 25.0000 ug | INTRAMUSCULAR | Status: DC | PRN

## 2020-05-16 MED ORDER — ONDANSETRON HCL 4 MG/2ML IJ SOLN
INTRAMUSCULAR | Status: AC
  Filled 2020-05-16: qty 2

## 2020-05-16 MED ORDER — SUCCINYLCHOLINE CHLORIDE 200 MG/10ML IV SOSY
PREFILLED_SYRINGE | INTRAVENOUS | Status: DC | PRN
  Administered 2020-05-16: 100 mg via INTRAVENOUS

## 2020-05-16 MED ORDER — ORAL CARE MOUTH RINSE: 15.0000 mL | Freq: Once | OROMUCOSAL | Status: AC

## 2020-05-16 MED ORDER — SUGAMMADEX SODIUM 500 MG/5ML IV SOLN
INTRAVENOUS | Status: DC | PRN
  Administered 2020-05-16: 200 mg via INTRAVENOUS

## 2020-05-16 MED ORDER — DEXAMETHASONE SODIUM PHOSPHATE 10 MG/ML IJ SOLN
INTRAMUSCULAR | Status: AC
  Filled 2020-05-16: qty 1

## 2020-05-16 MED ORDER — LACTATED RINGERS IV SOLN: Freq: Once | INTRAVENOUS | Status: AC

## 2020-05-16 MED ORDER — LIDOCAINE HCL (CARDIAC) PF 100 MG/5ML IV SOSY
PREFILLED_SYRINGE | INTRAVENOUS | Status: DC | PRN
  Administered 2020-05-16: 60 mg via INTRAVENOUS

## 2020-05-16 MED ORDER — ONDANSETRON HCL 4 MG/2ML IJ SOLN
INTRAMUSCULAR | Status: DC | PRN
  Administered 2020-05-16: 4 mg via INTRAVENOUS

## 2020-05-16 MED ORDER — CHLORHEXIDINE GLUCONATE CLOTH 2 % EX PADS: 6.0000 | MEDICATED_PAD | Freq: Once | CUTANEOUS | Status: DC

## 2020-05-16 MED ORDER — DEXAMETHASONE SODIUM PHOSPHATE 10 MG/ML IJ SOLN
INTRAMUSCULAR | Status: DC | PRN
  Administered 2020-05-16: 5 mg via INTRAVENOUS

## 2020-05-16 MED ORDER — MIDAZOLAM HCL 2 MG/2ML IJ SOLN
INTRAMUSCULAR | Status: AC
  Filled 2020-05-16: qty 2

## 2020-05-16 MED ORDER — CEFAZOLIN SODIUM-DEXTROSE 2-4 GM/100ML-% IV SOLN
INTRAVENOUS | Status: AC
  Filled 2020-05-16: qty 100

## 2020-05-16 MED ORDER — ROCURONIUM BROMIDE 100 MG/10ML IV SOLN
INTRAVENOUS | Status: DC | PRN
  Administered 2020-05-16: 40 mg via INTRAVENOUS

## 2020-05-16 MED ORDER — LACTATED RINGERS IV SOLN: INTRAVENOUS | Status: DC | PRN

## 2020-05-16 MED ORDER — SODIUM CHLORIDE 0.9 % IV SOLN
INTRAVENOUS | Status: DC | PRN
  Administered 2020-05-16: 30 mL

## 2020-05-16 MED ORDER — PROMETHAZINE HCL 25 MG/ML IJ SOLN: 6.2500 mg | INTRAMUSCULAR | Status: DC | PRN

## 2020-05-16 SURGICAL SUPPLY — 26 items
BALLN RETRIEVAL 12X15 (BALLOONS) IMPLANT
BALLN RETRIEVAL 12X15MM (BALLOONS)
BALN RTRVL 200 6-7FR 12-15 (BALLOONS)
BASKET TRAPEZOID 3X6 (MISCELLANEOUS) IMPLANT
BASKET TRAPEZOID LITHO 2.0X5 (MISCELLANEOUS) IMPLANT
BSKT STON RTRVL TRAPEZOID 2X5 (MISCELLANEOUS)
BSKT STON RTRVL TRAPEZOID 3X6 (MISCELLANEOUS)
DEVICE INFLATION ENCORE 26 (MISCELLANEOUS) IMPLANT
DEVICE LOCKING W-BIOPSY CAP (MISCELLANEOUS) IMPLANT
GUIDEWIRE HYDRA JAGWIRE .35 (WIRE) IMPLANT
GUIDEWIRE JAG HINI 025X260CM (WIRE) IMPLANT
KIT ENDO PROCEDURE PEN (KITS) IMPLANT
KIT TURNOVER KIT A (KITS) IMPLANT
LUBRICANT JELLY 4.5OZ STERILE (MISCELLANEOUS) IMPLANT
PAD ARMBOARD 7.5X6 YLW CONV (MISCELLANEOUS) IMPLANT
POSITIONER HEAD 8X9X4 ADT (SOFTGOODS) IMPLANT
SCOPE SPY DS DISPOSABLE (MISCELLANEOUS) IMPLANT
SNARE ROTATE MED OVAL 20MM (MISCELLANEOUS) IMPLANT
SNARE SHORT THROW 13M SML OVAL (MISCELLANEOUS) IMPLANT
SPHINCTEROTOME AUTOTOME .25 (MISCELLANEOUS) IMPLANT
SPHINCTEROTOME HYDRATOME 44 (MISCELLANEOUS) IMPLANT
SYSTEM CONTINUOUS INJECTION (MISCELLANEOUS) IMPLANT
TUBING INSUFFLATOR CO2MPACT (TUBING) IMPLANT
WALLSTENT METAL COVERED 10X60 (STENTS) IMPLANT
WALLSTENT METAL COVERED 10X80 (STENTS) IMPLANT
WATER STERILE IRR 1000ML POUR (IV SOLUTION) IMPLANT

## 2020-05-16 NOTE — H&P (Signed)
Olivia [name] is an 63 y.o. female.   Chief Complaint: Patient is here for ERCP with stent removal and/or exchange. HPI: Patient is 64 year old Caucasian female who presented earlier this year with abdominal pain and fluctuating transaminases.  Work-up revealed dilated bile duct and suggested choledocholithiasis.  She had ERCP on January 05, 2020 and she was found to have markedly dilated bile duct with small filling defect but she had distal stricture.  Small sphincterotomy was performed but she is still not draining.  Therefore biliary stent was left in place.  She says she has done well except for the last few weeks she has noted postprandial right upper quadrant abdominal pain which is dull not associated nausea vomiting fever or chills.  She remains with good appetite.  She has not lost any weight.  Her imaging studies revealed pneumobilia however on review of records from [place] I was not able to confirm that she ever had ERCP.  Past Medical History:  Diagnosis Date  . Dyslipidemia   . PAD (peripheral artery disease) ([place])   . PONV (postoperative nausea and vomiting)   . Psoriasis   . RA (rheumatoid arthritis) ([place])   . Vertigo     Past Surgical History:  Procedure Laterality Date  . ANEURYSM COILING Right    right toe   . BILIARY STENT PLACEMENT  01/05/2020   Procedure: BILIARY STENT PLACEMENT;  Surgeon: Rogene Houston, MD;  Location: [facility];  Service: Endoscopy;;  . CHOLECYSTECTOMY    . COLONOSCOPY  03/07/2015   Propofol; Surgeon: Dr. Laveda Norman; Three 6 mm rectosigmoid polyps, moderate left-sided diverticulosis, tortuous rectosigmoid colon. Pathology with inflamed hyperplastic polyp.  . COLONOSCOPY WITH PROPOFOL N/A 08/18/2019   non-bleeding internal hemorrhoids, pancolonic diverticulosis, two 4-7 mm polyps in rectum benign, no adenomas. Next colonoscopy in 10 years.   Marland Kitchen ERCP N/A 01/05/2020   Procedure: ENDOSCOPIC RETROGRADE CHOLANGIOPANCREATOGRAPHY (ERCP);  Surgeon: Rogene Houston,  MD;  Location: [facility];  Service: Endoscopy;  Laterality: N/A;  . HYSTERECTOMY ABDOMINAL WITH SALPINGO-OOPHORECTOMY    . ILEOSTOMY  2005   per patient for bowel obstruction and peritonitis with anastomosis  . INSERTION OF MESH     RLQ  . POLYPECTOMY  08/18/2019   Procedure: POLYPECTOMY;  Surgeon: Daneil Dolin, MD;  [facility];  Service: Endoscopy;;  . REMOVAL OF STONES N/A 01/05/2020   Procedure: REMOVAL OF STONES;  Surgeon: Rogene Houston, MD;  [facility];  Service: Endoscopy;  Laterality: N/A;  . SPHINCTEROTOMY N/A 01/05/2020   Procedure: SPHINCTEROTOMY;  Surgeon: Rogene Houston, MD;  Location[facility];  Service: Endoscopy;  Laterality: N/A;    Family History  Problem Relation Age of Onset  . Lung cancer Mother   . Lung cancer Father   . Stomach cancer Brother   . Breast cancer Cousin   . Colon cancer Neg Hx    Social History:  reports that she quit smoking about 21 months ago. Her smoking use included cigarettes. She has a 25.00 pack-year smoking history. She has never used smokeless tobacco. She reports previous alcohol use. She reports that she does not use drugs.  Allergies:  Allergies  Allergen Reactions  . Codeine     Headache-"draws line across forehead"    Medications Prior to Admission  Medication Sig Dispense Refill  . alendronate (FOSAMAX) 70 MG tablet Take 70 mg by mouth every Monday. Take with a full glass of water on an empty stomach.    Marland Kitchen  clopidogrel (PLAVIX) 75 MG tablet Take 75 mg by mouth daily.     . Cyanocobalamin (VITAMIN B 12 PO) Take 3,000 mcg by mouth daily.     . Cyanocobalamin (VITAMIN B-12 IJ) Inject 1,000 mcg into the muscle every 7 (seven) days.     Marland Kitchen Dexlansoprazole 30 MG capsule Take 30 mg by mouth daily as needed (acid reflux).     Marland Kitchen ergocalciferol (VITAMIN D2) 1.25 MG (50000 UT) capsule Take 50,000 Units by mouth every Tuesday.     . meclizine (ANTIVERT) 12.5 MG tablet Take 12.5 mg by mouth 3 (three)  times daily as needed for dizziness.     . Potassium 99 MG TABS Take 99 mg by mouth daily.    . predniSONE (DELTASONE) 5 MG tablet Take 5 mg by mouth daily with breakfast.     . rosuvastatin (CRESTOR) 40 MG tablet Take 40 mg by mouth at bedtime.    . sulfaSALAzine (AZULFIDINE) 500 MG tablet Take 500 mg by mouth 4 (four) times daily.     Marland Kitchen zinc gluconate 50 MG tablet Take 50 mg by mouth daily.    Marland Kitchen dicyclomine (BENTYL) 10 MG capsule Take 1 capsule (10 mg total) by mouth 2 (two) times daily before a meal. 60 capsule 1  . metoCLOPramide (REGLAN) 10 MG tablet Take 10 mg by mouth every 8 (eight) hours as needed for nausea.    . Omega-3 Fatty Acids (FISH OIL) 1000 MG CAPS Take 1,000 mg by mouth daily. (Patient not taking: Reported on 05/10/2020)    . ondansetron (ZOFRAN) 4 MG tablet Take 4 mg by mouth every 8 (eight) hours as needed for nausea or vomiting.      Results for orders placed or performed during the hospital encounter of 05/15/20 (from the past 48 hour(s))  SARS CORONAVIRUS 2 (TAT 6-24 HRS) Nasopharyngeal Nasopharyngeal Swab     Status: None   Collection Time: 05/15/20 10:21 AM   Specimen: Nasopharyngeal Swab  Result Value Ref Range   SARS Coronavirus 2 NEGATIVE NEGATIVE    Comment: (NOTE) SARS-CoV-2 target nucleic acids are NOT DETECTED.  The SARS-CoV-2 RNA is generally detectable in upper and lower respiratory specimens during the acute phase of infection. Negative results do not preclude SARS-CoV-2 infection, do not rule out co-infections with other pathogens, and should not be used as the sole basis for treatment or other patient management decisions. Negative results must be combined with clinical observations, patient history, and epidemiological information. The expected result is Negative.  Fact Sheet for Patients: SugarRoll.be  Fact Sheet for Healthcare Providers: https://www.woods-mathews.com/  This test is not yet approved or  cleared by the [place] FDA and  has been authorized for detection and/or diagnosis of SARS-CoV-2 by FDA under an Emergency Use Authorization (EUA). This EUA will remain  in effect (meaning this test can be used) for the duration of the COVID-19 declaration under Se ction 564(b)(1) of the Act, 21 U.S.C. section 360bbb-3(b)(1), unless the authorization is terminated or revoked sooner.  Performed at [facility], [place] [address], [place], [place] [address]    No results found.  Review of Systems  Blood pressure 140/69, pulse 69, temperature 98 F (36.7 C), temperature source Oral, resp. rate 15, height 5\' 3"  (1.6 m), weight 73.5 kg, SpO2 98 %. Physical Exam HENT:     Mouth/Throat:     Mouth: Mucous membranes are moist.     Pharynx: Oropharynx is clear.  Eyes:     Conjunctiva/sclera: Conjunctivae  normal.  Cardiovascular:     Rate and Rhythm: Normal rate and regular rhythm.     Heart sounds: Normal heart sounds. No murmur heard.   Pulmonary:     Effort: Pulmonary effort is normal.     Breath sounds: Normal breath sounds.  Abdominal:     Comments: Abdomen is full.  She has low midline scar along with small scar right lower quadrant from prior ileostomy.  Abdomen is soft and nontender with organomegaly or masses.  Musculoskeletal:        General: No swelling.     Cervical back: Neck supple.  Lymphadenopathy:     Cervical: No cervical adenopathy.  Skin:    General: Skin is warm.  Neurological:     Mental Status: She is alert.      Assessment/Plan Bile duct stricture. ERCP with stent removal or exchange depending on findings.  Hildred Laser, MD 05/16/2020, 12:49 PM

## 2020-05-16 NOTE — Discharge Instructions (Signed)
No aspirin,  NSAIDs or Plavix for 3 days. Resume other medications as before. Clear liquids today.  Usual diet starting tomorrow morning. No driving for 24 hours. Physician will call with results of brushing cytology.       Endoscopic Retrograde Cholangiopancreatogram, Care After This sheet gives you information about how to care for yourself after your procedure. Your health care provider may also give you more specific instructions. If you have problems or questions, contact your health care provider. What can I expect after the procedure? After the procedure, it is common to have:  Soreness in your throat.  Nausea.  Bloating.  Dizziness.  Tiredness (fatigue). Follow these instructions at home:   Take over-the-counter and prescription medicines only as told by your health care provider.  Do not drive for 24 hours if you were given a medicine to help you relax (sedative) during your procedure. Have someone stay with you for 24 hours after the procedure.  Return to your normal activities as told by your health care provider. Ask your health care provider what activities are safe for you.  Return to eating what you normally do as soon as you feel well enough or as told by your health care provider.  Keep all follow-up visits as told by your health care provider. This is important. Contact a health care provider if:  You have pain in your abdomen that does not get better with medicine.  You develop signs of infection, such as: ? Chills. ? Feeling unwell. Get help right away if:  You have difficulty swallowing.  You have worsening pain in your throat, chest, or abdomen.  You vomit bright red blood or a substance that looks like coffee grounds.  You have bloody or very black stools.  You have a fever.  You have a sudden increase in swelling (bloating) in your abdomen. Summary  After the procedure, it is common to feel tired and to have some discomfort in your  throat.  Contact your health care provider if you have signs of infection--such as chills or feeling unwell--or if you have pain that does not improve with medicine.  Get help right away if you have trouble swallowing, worsening pain, bloody or black vomit, bloody or black stools, a fever, or increased swelling in your abdomen.  Keep all follow-up visits as told by your health care provider. This is important. This information is not intended to replace advice given to you by your health care provider. Make sure you discuss any questions you have with your health care provider. Document Revised: 06/05/2017 Document Reviewed: 05/12/2016 Elsevier Patient Education  2020 [place] After These instructions provide you with information about caring for yourself after your procedure. Your health care provider may also give you more specific instructions. Your treatment has been planned according to current medical practices, but problems sometimes occur. Call your health care provider if you have any problems or questions after your procedure. What can I expect after the procedure? After your procedure, you may:  Feel sleepy for several hours.  Feel clumsy and have poor balance for several hours.  Feel forgetful about what happened after the procedure.  Have poor judgment for several hours.  Feel nauseous or vomit.  Have a sore throat if you had a breathing tube during the procedure. Follow these instructions at home: For at least 24 hours after the procedure:      Have a responsible adult stay with  you. It is important to have someone help care for you until you are awake and alert.  Rest as needed.  Do not: ? Participate in activities in which you could fall or become injured. ? Drive. ? Use heavy machinery. ? Drink alcohol. ? Take sleeping pills or medicines that cause drowsiness. ? Make important decisions or sign legal  documents. ? Take care of children on your own. Eating and drinking  Follow the diet that is recommended by your health care provider.  If you vomit, drink water, juice, or soup when you can drink without vomiting.  Make sure you have little or no nausea before eating solid foods. General instructions  Take over-the-counter and prescription medicines only as told by your health care provider.  If you have sleep apnea, surgery and certain medicines can increase your risk for breathing problems. Follow instructions from your health care provider about wearing your sleep device: ? Anytime you are sleeping, including during daytime naps. ? While taking prescription pain medicines, sleeping medicines, or medicines that make you drowsy.  If you smoke, do not smoke without supervision.  Keep all follow-up visits as told by your health care provider. This is important. Contact a health care provider if:  You keep feeling nauseous or you keep vomiting.  You feel light-headed.  You develop a rash.  You have a fever. Get help right away if:  You have trouble breathing. Summary  For several hours after your procedure, you may feel sleepy and have poor judgment.  Have a responsible adult stay with you for at least 24 hours or until you are awake and alert. This information is not intended to replace advice given to you by your health care provider. Make sure you discuss any questions you have with your health care provider. Document Revised: 09/21/2017 Document Reviewed: 10/14/2015 Elsevier Patient Education  [place] Liquid Diet, Adult A clear liquid diet is a diet that includes only liquids and semi-liquids that you can see through. You do not eat any food on this diet. Most people need to follow this diet for only a short time. You may need to follow a clear liquid diet if:  You have a problem right before or after you have surgery.  You did not eat food  for a long time.  You had any of these: ? Feeling sick to your stomach (nausea). ? Throwing up (vomiting). ? Passing a watery stool (diarrhea).  You are going to have an exam to look at parts of your digestive system.  You are going to have bowel surgery. The goals of this diet are:  To rest the stomach.  To help you clear the digestive system before an exam.  To make sure that there is enough fluid in your body.  To make sure you get some energy.  To help you get back to eating like you used to. What are tips for following this plan?  A clear liquid is a liquid or semi-liquid that you can see through when you hold it up to a light. An example of this is gelatin.  This diet does not give you all the nutrients that you need. Choose a variety of the liquids that your doctor says you can drink on this diet. That way, you will get as many nutrients as possible.  If you are not sure whether you can have certain items, ask your doctor. If you are unable to swallow a  thin liquid, you will need to thicken it before taking it. This will stop you from breathing it in (aspiration). What foods should I eat?   Water and flavored water.  Fruit juices that do not have pulp, such as cranberry juice and apple juice.  Tea and coffee without milk or cream.  Clear bouillon or broth.  Broth-based soups that have been strained.  Flavored gelatins.  Honey.  Sugar water.  Ice or frozen ice pops that do not have any milk, yogurt, fruit pieces, or fruit pulp in them.  Clear sodas.  Clear sports drinks. The items listed above may not be a complete list of what you can eat and drink. Contact a dietitian for more options. What foods should I avoid?  Juices that have pulp.  Milk.  Cream or cream-based soups.  Yogurt.  Normal foods that are not clear liquids or semi-liquids. The items listed above may not be a complete list of what you should not eat and drink. Contact a dietitian  for options. Questions to ask your health care provider  How long do I need to follow this diet?  Are there any medicines that I should change while on this diet? Summary  A clear liquid diet is a diet that includes only liquids and semi-liquids that you can see through.  Some goals of this diet are to rest your stomach, make sure you get enough fluid, and give you some energy.  Avoid liquids with milk, cream, or pulp while you are on this diet. This information is not intended to replace advice given to you by your health care provider. Make sure you discuss any questions you have with your health care provider. Document Revised: 12/14/2017 Document Reviewed: 12/14/2017 Elsevier Patient Education  [place].

## 2020-05-16 NOTE — Anesthesia Preprocedure Evaluation (Addendum)
Anesthesia Evaluation  Patient identified by MRN, date of birth, ID band Patient awake    Reviewed: Allergy & Precautions, NPO status , Patient's Chart, lab work & pertinent test results  History of Anesthesia Complications (+) PONV and history of anesthetic complications  Airway Mallampati: II  TM Distance: >3 FB Neck ROM: Full    Dental  (+) Dental Advisory Given Crown:   Pulmonary neg pulmonary ROS, former smoker,    Pulmonary exam normal breath sounds clear to auscultation       Cardiovascular Exercise Tolerance: Good + Peripheral Vascular Disease  Normal cardiovascular exam Rhythm:Regular Rate:Normal     Neuro/Psych negative neurological ROS  negative psych ROS   GI/Hepatic Neg liver ROS, GERD  Medicated,  Endo/Other  negative endocrine ROS  Renal/GU negative Renal ROS     Musculoskeletal  (+) Arthritis , Osteoarthritis,    Abdominal   Peds negative pediatric ROS (+)  Hematology negative hematology ROS (+)   Anesthesia Other Findings   Reproductive/Obstetrics                            Anesthesia Physical  Anesthesia Plan  ASA: II  Anesthesia Plan: General   Post-op Pain Management:    Induction: Intravenous  PONV Risk Score and Plan: 4 or greater and Ondansetron, Dexamethasone and Midazolam  Airway Management Planned: Oral ETT  Additional Equipment:   Intra-op Plan:   Post-operative Plan: Extubation in OR  Informed Consent: I have reviewed the patients History and Physical, chart, labs and discussed the procedure including the risks, benefits and alternatives for the proposed anesthesia with the patient or authorized representative who has indicated his/her understanding and acceptance.     Dental advisory given  Plan Discussed with: CRNA and Surgeon  Anesthesia Plan Comments:         Anesthesia Quick Evaluation

## 2020-05-16 NOTE — Op Note (Signed)
[facility] Patient Name: Olivia [name] Procedure Date: 05/16/2020 1:13 PM MRN: [id number] Date of Birth: 1956-05-23 Attending MD: Hildred Laser , MD CSN: [id number] Age: 64 Admit Type: Outpatient Procedure:                ERCP Indications:              Common bile duct stricture Providers:                Hildred Laser, MD, Otis Peak B. Sharon Seller, RN, Aram Candela Referring MD:              Medicines:                General Anesthesia Complications:            No immediate complications. Estimated Blood Loss:     Estimated blood loss: none. Procedure:                Pre-Anesthesia Assessment:                           - Prior to the procedure, a History and Physical                            was performed, and patient medications and                            allergies were reviewed. The patient's tolerance of                            previous anesthesia was also reviewed. The risks                            and benefits of the procedure and the sedation                            options and risks were discussed with the patient.                            All questions were answered, and informed consent                            was obtained. Prior Anticoagulants: The patient has                            taken no previous anticoagulant or antiplatelet                            agents. ASA Grade Assessment: II - A patient with                            mild systemic disease. After reviewing the risks  and benefits, the patient was deemed in                            satisfactory condition to undergo the procedure.                           After obtaining informed consent, the scope was                            passed under direct vision. Throughout the                            procedure, the patient's blood pressure, pulse, and                            oxygen saturations were monitored continuously. The                             [id number]-[id number] ([id number]) scope was introduced through                            the mouth, and used to inject contrast into and                            used to inject contrast into the bile duct. The                            ERCP was accomplished without difficulty. The                            patient tolerated the procedure well. Scope In: 1:38:21 PM Scope Out: 2:08:53 PM Total Procedure Duration: 0 hours 30 minutes 32 seconds  Findings:      A biliary stent was visible on the scout film. The scope was passed       through the upper GI tract without discovering UGI findings. One stent       was removed from the biliary tree using a snare. The stent was found to       be partially occluded via the water column test. The bile duct was       deeply cannulated with the Hydratome sphincterotome. Contrast was       injected. I personally interpreted the bile duct images. Ductal flow of       contrast was adequate. Image quality was adequate. Contrast extended to       the entire biliary tree. The common bile duct and common hepatic duct       were severely dilated, secondary to a stricture. Distal bile duct, The       biliary sphincterotomy was extended to a total of 7 mm in length with a       braided Hydratome sphincterotome using ERBE electrocautery. There was no       post-sphincterotomy bleeding. Cells for cytology were obtained by       brushing in the lower third of the main bile duct. One 10 Fr by 7 cm       plastic stent was placed 6  cm into the common bile duct. Bile flowed       through the stent. The stent was in good position. Impression:               - The common bile duct and common hepatic duct were                            severely dilated, secondary to a stricture.                           - One stent was removed from the biliary tree.                           - A biliary sphincterotomy was performed.                           - Cells for cytology  obtained in the lower third of                            the main duct.                           - One plastic stent was placed into the common bile                            duct. Moderate Sedation:      Per Anesthesia Care Recommendation:           - Discharge patient to home (with spouse).                           - Avoid aspirin and nonsteroidal anti-inflammatory                            medicines for 3 days.                           - Continue present medications.                           - Await cytology results. Procedure Code(s):        --- Professional ---                           [phone], Endoscopic retrograde                            cholangiopancreatography (ERCP); with removal and                            exchange of stent(s), biliary or pancreatic duct,                            including pre- and post-dilation and guide wire                            passage, when  performed, including sphincterotomy,                            when performed, each stent exchanged Diagnosis Code(s):        --- Professional ---                           K83.1, Obstruction of bile duct                           Z46.59, Encounter for fitting and adjustment of                            other gastrointestinal appliance and device CPT copyright 2019 American [employer]. All rights reserved. The codes documented in this report are preliminary and upon coder review may  be revised to meet current compliance requirements. Hildred Laser, MD Hildred Laser, MD 05/16/2020 4:12:11 PM This report has been signed electronically. Number of Addenda: 0

## 2020-05-16 NOTE — Transfer of Care (Signed)
Immediate Anesthesia Transfer of Care Note  Patient: Olivia [name]  Procedure(s) Performed: ENDOSCOPIC RETROGRADE CHOLANGIOPANCREATOGRAPHY (ERCP) (N/A ) STENT REMOVAL (N/A ) SPHINCTEROTOMY (N/A ) BILIARY BRUSHING BILIARY STENT PLACEMENT  Patient Location: PACU  Anesthesia Type:General  Level of Consciousness: awake, alert , oriented and patient cooperative  Airway & Oxygen Therapy: Patient Spontanous Breathing and Patient connected to nasal cannula oxygen  Post-op Assessment: Report given to RN, Post -op Vital signs reviewed and stable and Patient moving all extremities X 4  Post vital signs: Reviewed and stable  Last Vitals:  Vitals Value Taken Time  BP 159/61 05/16/20 1422  Temp    Pulse 89 05/16/20 1424  Resp 12 05/16/20 1424  SpO2 95 % 05/16/20 1424  Vitals shown include unvalidated device data.  Last Pain:  Vitals:   05/16/20 1129  TempSrc: Oral  PainSc: 0-No pain      Patients Stated Pain Goal: 5 (54/27/06 2376)  Complications: No complications documented.

## 2020-05-16 NOTE — Progress Notes (Signed)
ERCP note.  Plastic biliary stent removed.  It was partially occluded. Cholangiogram reveals markedly dilated CBD and CHD with distal stricture with the shoulder. Sphincterotomy extended. Brushings cytology obtained. 10 French 7 cm plastic biliary stent placed for decompression.  Patient tolerated the procedure well.

## 2020-05-16 NOTE — Anesthesia Procedure Notes (Signed)
Procedure Name: Intubation Date/Time: 05/16/2020 1:27 PM Performed by: Jonna Munro, CRNA Pre-anesthesia Checklist: Patient identified, Emergency Drugs available, Suction available, Patient being monitored and Timeout performed Patient Re-evaluated:Patient Re-evaluated prior to induction Oxygen Delivery Method: Circle system utilized Preoxygenation: Pre-oxygenation with 100% oxygen Induction Type: IV induction Laryngoscope Size: Mac and 3 Grade View: Grade II Tube size: 7.0 mm Number of attempts: 1 Airway Equipment and Method: Stylet Placement Confirmation: positive ETCO2,  ETT inserted through vocal cords under direct vision and breath sounds checked- equal and bilateral Secured at: 22 cm Tube secured with: Tape Dental Injury: Teeth and Oropharynx as per pre-operative assessment

## 2020-05-16 NOTE — Anesthesia Postprocedure Evaluation (Signed)
Anesthesia Post Note  Patient: Olivia [name]  Procedure(s) Performed: ENDOSCOPIC RETROGRADE CHOLANGIOPANCREATOGRAPHY (ERCP) (N/A ) STENT REMOVAL (N/A ) SPHINCTEROTOMY (N/A ) BILIARY BRUSHING BILIARY STENT PLACEMENT  Anesthesia Type: General Level of consciousness: awake, oriented, awake and alert and patient cooperative Pain management: satisfactory to patient Vital Signs Assessment: post-procedure vital signs reviewed and stable Respiratory status: spontaneous breathing, respiratory function stable and nonlabored ventilation Cardiovascular status: stable Postop Assessment: no apparent nausea or vomiting Anesthetic complications: no   No complications documented.   Last Vitals:  Vitals:   05/16/20 1129  BP: 140/69  Pulse: 69  Resp: 15  Temp: 36.7 C  SpO2: 98%    Last Pain:  Vitals:   05/16/20 1129  TempSrc: Oral  PainSc: 0-No pain                 Rajan Burgard

## 2020-05-17 ENCOUNTER — Encounter (HOSPITAL_COMMUNITY): Payer: Self-pay | Admitting: Internal Medicine

## 2020-05-17 LAB — CYTOLOGY - NON PAP

## 2020-05-24 ENCOUNTER — Telehealth (INDEPENDENT_AMBULATORY_CARE_PROVIDER_SITE_OTHER): Payer: Self-pay | Admitting: Internal Medicine

## 2020-05-24 NOTE — Telephone Encounter (Signed)
Patient left voice mail message stating she received a prednisone shot from her RA doctor - since then she has been having black stools - please advise - ph# [phone] - not sure if she should be seeing [place]

## 2020-05-24 NOTE — Telephone Encounter (Signed)
Patient's call returned. Unless she is taking Pepto-Bismol she can stop by the office to pick up Hemoccult card and bring [place] a stool sample so that we can check

## 2020-05-25 ENCOUNTER — Observation Stay (HOSPITAL_COMMUNITY)
Admission: EM | Admit: 2020-05-25 | Discharge: 2020-05-26 | Disposition: A | Discharge status: Home / Self Care | Payer: Medicare Other | Attending: Family Medicine | Admitting: Family Medicine

## 2020-05-25 ENCOUNTER — Encounter (HOSPITAL_COMMUNITY): Payer: Self-pay

## 2020-05-25 ENCOUNTER — Telehealth (INDEPENDENT_AMBULATORY_CARE_PROVIDER_SITE_OTHER): Payer: Self-pay | Admitting: *Deleted

## 2020-05-25 ENCOUNTER — Other Ambulatory Visit: Payer: Self-pay

## 2020-05-25 ENCOUNTER — Emergency Department (HOSPITAL_COMMUNITY): Payer: Medicare Other

## 2020-05-25 DIAGNOSIS — D72829 Elevated white blood cell count, unspecified: Secondary | ICD-10-CM | POA: Diagnosis not present

## 2020-05-25 DIAGNOSIS — E559 Vitamin D deficiency, unspecified: Secondary | ICD-10-CM

## 2020-05-25 DIAGNOSIS — K219 Gastro-esophageal reflux disease without esophagitis: Secondary | ICD-10-CM | POA: Diagnosis not present

## 2020-05-25 DIAGNOSIS — Z87891 Personal history of nicotine dependence: Secondary | ICD-10-CM | POA: Diagnosis not present

## 2020-05-25 DIAGNOSIS — E785 Hyperlipidemia, unspecified: Secondary | ICD-10-CM | POA: Diagnosis not present

## 2020-05-25 DIAGNOSIS — K921 Melena: Secondary | ICD-10-CM

## 2020-05-25 DIAGNOSIS — K922 Gastrointestinal hemorrhage, unspecified: Secondary | ICD-10-CM | POA: Diagnosis not present

## 2020-05-25 DIAGNOSIS — Z20822 Contact with and (suspected) exposure to covid-19: Secondary | ICD-10-CM | POA: Insufficient documentation

## 2020-05-25 DIAGNOSIS — D75839 Thrombocytosis, unspecified: Secondary | ICD-10-CM

## 2020-05-25 DIAGNOSIS — D649 Anemia, unspecified: Secondary | ICD-10-CM

## 2020-05-25 LAB — RESPIRATORY PANEL BY RT PCR (FLU A&B, COVID)
Influenza A by PCR: NEGATIVE
Influenza B by PCR: NEGATIVE
SARS Coronavirus 2 by RT PCR: NEGATIVE

## 2020-05-25 LAB — COMPREHENSIVE METABOLIC PANEL
ALT: 43 U/L (ref 0–44)
AST: 28 U/L (ref 15–41)
Albumin: 3.8 g/dL (ref 3.5–5.0)
Alkaline Phosphatase: 59 U/L (ref 38–126)
Anion gap: 7 (ref 5–15)
BUN: 13 mg/dL (ref 8–23)
CO2: 26 mmol/L (ref 22–32)
Calcium: 9.1 mg/dL (ref 8.9–10.3)
Chloride: 103 mmol/L (ref 98–111)
Creatinine, Ser: 0.8 mg/dL (ref 0.44–1.00)
GFR, Estimated: >60 mL/min (ref >60)
Glucose, Bld: 114 mg/dL — ABNORMAL HIGH (ref 70–99)
Potassium: 3.8 mmol/L (ref 3.5–5.1)
Sodium: 136 mmol/L (ref 135–145)
Total Bilirubin: 0.2 mg/dL — ABNORMAL LOW (ref 0.3–1.2)
Total Protein: 6.8 g/dL (ref 6.5–8.1)

## 2020-05-25 LAB — CBC WITH DIFFERENTIAL/PLATELET
Abs Immature Granulocytes: 0.26 10*3/uL — ABNORMAL HIGH (ref 0.00–0.07)
Basophils Absolute: 0.1 10*3/uL (ref 0.0–0.1)
Basophils Relative: 1 %
Eosinophils Absolute: 0.2 10*3/uL (ref 0.0–0.5)
Eosinophils Relative: 1 %
HCT: 31.9 % — ABNORMAL LOW (ref 36.0–46.0)
Hemoglobin: 9.8 g/dL — ABNORMAL LOW (ref 12.0–15.0)
Immature Granulocytes: 2 %
Lymphocytes Relative: 31 %
Lymphs Abs: 5.4 10*3/uL — ABNORMAL HIGH (ref 0.7–4.0)
MCH: 27.6 pg (ref 26.0–34.0)
MCHC: 30.7 g/dL (ref 30.0–36.0)
MCV: 89.9 fL (ref 80.0–100.0)
Monocytes Absolute: 1.6 10*3/uL — ABNORMAL HIGH (ref 0.1–1.0)
Monocytes Relative: 10 %
Neutro Abs: 9.7 10*3/uL — ABNORMAL HIGH (ref 1.7–7.7)
Neutrophils Relative %: 55 %
Platelets: 692 10*3/uL — ABNORMAL HIGH (ref 150–400)
RBC: 3.55 MIL/uL — ABNORMAL LOW (ref 3.87–5.11)
RDW: 14.6 % (ref 11.5–15.5)
WBC: 17.3 10*3/uL — ABNORMAL HIGH (ref 4.0–10.5)
nRBC: 0 % (ref 0.0–0.2)

## 2020-05-25 LAB — TYPE AND SCREEN
ABO/RH(D): A POS
Antibody Screen: NEGATIVE

## 2020-05-25 LAB — POC OCCULT BLOOD, ED: Fecal Occult Bld: POSITIVE — AB

## 2020-05-25 LAB — LIPASE, BLOOD: Lipase: 39 U/L (ref 11–51)

## 2020-05-25 MED ORDER — PANTOPRAZOLE SODIUM 40 MG IV SOLR
40.0000 mg | Freq: Once | INTRAVENOUS | Status: AC
  Administered 2020-05-26: 40 mg via INTRAVENOUS
  Filled 2020-05-25: qty 40

## 2020-05-25 NOTE — ED Provider Notes (Signed)
[facility] EMERGENCY DEPARTMENT Provider Note   CSN: [id number] Arrival date & time: 05/25/20  2031     History Chief Complaint  Patient presents with  . Melena    Olivia [name] is a 64 y.o. female.  Patient with 4-day history of black tarry stools.  Patient without any symptoms associated with this.  No abdominal pain.  Patient has a past medical history of several ERCPs for concerns for biliary stricture.  And prior bowel perforation with peritonitis resulting in ileostomy in her 72s.  That has been taken down.  Patient is followed by Dr. Arthor Captain who saw the patient today.  Was never checked Hemoccult cards at home.  Patient got concerned because she continued to have the black tarry stools.        Past Medical History:  Diagnosis Date  . Dyslipidemia   . PAD (peripheral artery disease) ([place])   . PONV (postoperative nausea and vomiting)   . Psoriasis   . RA (rheumatoid arthritis) ([place])   . Vertigo     Patient Active Problem List   Diagnosis Date Noted  . Choledocholithiasis 12/26/2019  . RUQ pain 09/27/2019  . Neck mass 09/27/2019  . Elevated LFTs 05/02/2019  . Fatty liver 05/02/2019  . Rectal bleeding 05/02/2019    Past Surgical History:  Procedure Laterality Date  . ANEURYSM COILING Right    right toe   . BILIARY BRUSHING  05/16/2020   Procedure: BILIARY BRUSHING;  Surgeon: Rogene Houston, MD;  Location: [facility] ORS;  Service: Endoscopy;;  . BILIARY STENT PLACEMENT  01/05/2020   Procedure: BILIARY STENT PLACEMENT;  Surgeon: Rogene Houston, MD;  Location: [facility] ENDO SU[facility];  Service: Endoscopy;;  . BILIARY STENT PLACEMENT  05/16/2020   Procedure: BILIARY STENT PLACEMENT;  Surgeon: Rogene Houston, MD;  Location: [facility];  Service: Endoscopy;;  . CHOLECYSTECTOMY    . COLONOSCOPY  03/07/2015   Propofol; Surgeon: Dr. Laveda Norman; Three 6 mm rectosigmoid polyps, moderate left-sided diverticulosis, tortuous rectosigmoid colon. Pathology with inflamed hyperplastic polyp.  .  COLONOSCOPY WITH PROPOFOL N/A 08/18/2019   non-bleeding internal hemorrhoids, pancolonic diverticulosis, two 4-7 mm polyps in rectum benign, no adenomas. Next colonoscopy in 10 years.   Marland Kitchen ERCP N/A 01/05/2020   Procedure: ENDOSCOPIC RETROGRADE CHOLANGIOPANCREATOGRAPHY (ERCP);  Surgeon: Rogene Houston, MD;  Location: [facility];  Service: Endoscopy;  Laterality: N/A;  . ERCP N/A 05/16/2020   Procedure: ENDOSCOPIC RETROGRADE CHOLANGIOPANCREATOGRAPHY (ERCP);  Surgeon: Rogene Houston, MD;  Location: [facility];  Service: Endoscopy;  Laterality: N/A;  . GASTROINTESTINAL STENT REMOVAL N/A 05/16/2020   Procedure: STENT REMOVAL;  Surgeon: Rogene Houston, MD;  Location: [facility];  Service: Endoscopy;  Laterality: N/A;  . HYSTERECTOMY ABDOMINAL WITH SALPINGO-OOPHORECTOMY    . ILEOSTOMY  2005   per patient for bowel obstruction and peritonitis with anastomosis  . INSERTION OF MESH     RLQ  . POLYPECTOMY  08/18/2019   Procedure: POLYPECTOMY;  Surgeon: Daneil Dolin, MD;  Location: [facility];  Service: Endoscopy;;  . REMOVAL OF STONES N/A 01/05/2020   Procedure: REMOVAL OF STONES;  Surgeon: Rogene Houston, MD;  Location: [facility];  Service: Endoscopy;  Laterality: N/A;  . SPHINCTEROTOMY N/A 01/05/2020   Procedure: SPHINCTEROTOMY;  Surgeon: Rogene Houston, MD;  Location: [facility];  Service: Endoscopy;  Laterality: N/A;  . SPHINCTEROTOMY N/A 05/16/2020   Procedure: SPHINCTEROTOMY;  Surgeon: Rogene Houston, MD;  Location: [facility];  Service: Endoscopy;  Laterality:  N/A;     OB History   No obstetric history on file.     Family History  Problem Relation Age of Onset  . Lung cancer Mother   . Lung cancer Father   . Stomach cancer Brother   . Breast cancer Cousin   . Colon cancer Neg Hx     Social History   Tobacco Use  . Smoking status: Former Smoker    Packs/day: 0.50    Years: 50.00    Pack years: 25.00    Types: Cigarettes    Quit date: 08/14/2018    Years since quitting:  1.7  . Smokeless tobacco: Never Used  Vaping Use  . Vaping Use: Never used  Substance Use Topics  . Alcohol use: Not Currently  . Drug use: Never    Home Medications Prior to Admission medications   Medication Sig Start Date End Date Taking? Authorizing Provider  alendronate (FOSAMAX) 70 MG tablet Take 70 mg by mouth every Monday. Take with a full glass of water on an empty stomach.    [provider]  clopidogrel (PLAVIX) 75 MG tablet Take 75 mg by mouth daily.     [provider]  Cyanocobalamin (VITAMIN B 12 PO) Take 3,000 mcg by mouth daily.     [provider]  Cyanocobalamin (VITAMIN B-12 IJ) Inject 1,000 mcg into the muscle every 7 (seven) days.     [provider]  Dexlansoprazole 30 MG capsule Take 30 mg by mouth daily as needed (acid reflux).     [provider]  dicyclomine (BENTYL) 10 MG capsule Take 1 capsule (10 mg total) by mouth 2 (two) times daily before a meal. 01/11/20   Rehman, Mechele Dawley, MD  ergocalciferol (VITAMIN D2) 1.25 MG (50000 UT) capsule Take 50,000 Units by mouth every Tuesday.     [provider]  meclizine (ANTIVERT) 12.5 MG tablet Take 12.5 mg by mouth 3 (three) times daily as needed for dizziness.     [provider]  metoCLOPramide (REGLAN) 10 MG tablet Take 10 mg by mouth every 8 (eight) hours as needed for nausea.    [provider]  ondansetron (ZOFRAN) 4 MG tablet Take 4 mg by mouth every 8 (eight) hours as needed for nausea or vomiting.    [provider]  Potassium 99 MG TABS Take 99 mg by mouth daily.    [provider]  predniSONE (DELTASONE) 5 MG tablet Take 5 mg by mouth daily with breakfast.     [provider]  rosuvastatin (CRESTOR) 40 MG tablet Take 40 mg by mouth at bedtime.    [provider]  sulfaSALAzine (AZULFIDINE) 500 MG tablet Take 500 mg by mouth 4 (four) times daily.  09/08/19   [provider]  zinc gluconate 50 MG  tablet Take 50 mg by mouth daily.    [provider]    Allergies    Codeine  Review of Systems   Review of Systems  Constitutional: Negative for chills and fever.  HENT: Negative for rhinorrhea and sore throat.   Eyes: Negative for visual disturbance.  Respiratory: Negative for cough and shortness of breath.   Cardiovascular: Negative for chest pain and leg swelling.  Gastrointestinal: Positive for blood in stool. Negative for abdominal pain, diarrhea, nausea and vomiting.  Genitourinary: Negative for dysuria.  Musculoskeletal: Negative for back pain and neck pain.  Skin: Negative for rash.  Neurological: Negative for dizziness, light-headedness and headaches.  Hematological: Does not  bruise/bleed easily.  Psychiatric/Behavioral: Negative for confusion.    Physical Exam Updated Vital Signs BP (!) 128/57   Pulse 62   Temp 97.8 F (36.6 C) (Oral)   Resp 18   Ht 1.6 m (5\' 3" )   Wt 73.5 kg   SpO2 98%   BMI 28.70 kg/m   Physical Exam Vitals and nursing note reviewed.  Constitutional:      General: She is not in acute distress.    Appearance: Normal appearance. She is well-developed.  HENT:     Head: Normocephalic and atraumatic.  Eyes:     Extraocular Movements: Extraocular movements intact.     Conjunctiva/sclera: Conjunctivae normal.     Pupils: Pupils are equal, round, and reactive to light.  Cardiovascular:     Rate and Rhythm: Normal rate and regular rhythm.     Heart sounds: No murmur heard.   Pulmonary:     Effort: Pulmonary effort is normal. No respiratory distress.     Breath sounds: Normal breath sounds.  Abdominal:     Palpations: Abdomen is soft.     Tenderness: There is no abdominal tenderness.  Genitourinary:    Rectum: Guaiac result positive.  Musculoskeletal:        General: No swelling. Normal range of motion.     Cervical back: Neck supple.  Skin:    General: Skin is warm and dry.  Neurological:     General: No focal deficit  present.     Mental Status: She is alert and oriented to person, place, and time.     Cranial Nerves: No cranial nerve deficit.     Sensory: No sensory deficit.     Motor: No weakness.     ED Results / Procedures / Treatments   Labs (all labs ordered are listed, but only abnormal results are displayed) Labs Reviewed  CBC WITH DIFFERENTIAL/PLATELET - Abnormal; Notable for the following components:      Result Value   WBC 17.3 (*)    RBC 3.55 (*)    Hemoglobin 9.8 (*)    HCT 31.9 (*)    Platelets 692 (*)    Neutro Abs 9.7 (*)    Lymphs Abs 5.4 (*)    Monocytes Absolute 1.6 (*)    Abs Immature Granulocytes 0.26 (*)    All other components within normal limits  POC OCCULT BLOOD, ED - Abnormal; Notable for the following components:   Fecal Occult Bld POSITIVE (*)    All other components within normal limits  RESP PANEL BY RT-PCR (FLU A&B, COVID) [id number]  RESPIRATORY PANEL BY RT PCR (FLU A&B, COVID)  LIPASE, BLOOD  COMPREHENSIVE METABOLIC PANEL  TYPE AND SCREEN    EKG None  Radiology No results found.  Procedures Procedures (including critical care time)  Medications Ordered in ED Medications - No data to display  ED Course  I have reviewed the triage vital signs and the nursing notes.  Pertinent labs & imaging results that were available during my care of the patient were reviewed by me and considered in my medical decision making (see chart for details).    MDM Rules/Calculators/A&P                          Patient nontoxic no acute distress.  Abdomen soft nontender.  CT scans been ordered because of the melena.  Patient has a leukocytosis.  But often her white blood cell count is in the 12-14,000 range.  Hemoglobin is down to 9.8.  It was 12.9 in June.  So this is a bit of a drop.  Patient is followed by gastroenterology.  Actually saw Dr. Melony Overly today.  I think is probably best with the drop in hemoglobin to admit her for observation.  Patient in no acute distress.   Vital signs are stable.  CT scan of the abdomen done just for further diagnostic evaluation.  Type and screen has been ordered.  Patient does not require blood transfusion at this time.  Covid testing has been ordered.  Patient has had both vaccines.    Final Clinical Impression(s) / ED Diagnoses Final diagnoses:  Melena    Rx / DC Orders ED Discharge Orders    None       Fredia Sorrow, MD 05/25/20 2308

## 2020-05-25 NOTE — ED Triage Notes (Signed)
Pt reports black tarry stool that started 4 days ago. Pt went to PCP today and was given hemoccult cards to return on Monday. Pt says she has been having one BM daily, BM is formed and black. Pt denies any other symptoms. Pt denies pain.

## 2020-05-25 NOTE — H&P (Signed)
History and Physical  Olivia [name] ZOX:[id number] DOB: 1955/10/05 DOA: 05/25/2020  Referring physician: Fredia Sorrow, MD PCP: [facility]  Patient coming from: Home  Chief Complaint: Black tarry stool  HPI: Olivia [name] is a 64 y.o. 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  She follows with Dr. Arthor Captain and she called his office yesterday (11/18) regarding the black stool, patient was asked to stop by the office to pick up Hemoccult card and bring a stool sample for follow-up.  She also called her rheumatologist who advised her to go to the ED for further evaluation and management.  Patient denies any abdominal pain, she denies being on any iron tablets or Pepto-Bismol.  She was on Plavix, but she has stopped taking this prior to having an ERCP with stent removal and/or exchange on 05/16/2020.  ED Course:  In the emergency department, she was hemodynamically stable.  Work-up in the ED showed leukocytosis, thrombocytosis and normocytic anemia.  Fecal occult blood was positive.  IV Protonix 40 Mg x1 was given.  Hospitalist was asked to admit.  For further evaluation and management.  Review of Systems: Constitutional: Negative for chills and fever.  HENT: Negative for ear pain and sore throat.   Eyes: Negative for pain and visual disturbance.  Respiratory: Negative for cough, chest tightness and shortness of breath.   Cardiovascular: Negative for chest pain and palpitations.  Gastrointestinal: Positive for black tarry stools.  Negative for abdominal pain and vomiting.  Endocrine: Negative for  polyphagia and polyuria.  Genitourinary: Negative for decreased urine volume, dysuria, enuresis Musculoskeletal: Negative for arthralgias and back pain.  Skin: Negative for color change and rash.  Allergic/Immunologic: Negative for immunocompromised state.  Neurological: Negative for tremors, syncope, speech difficulty, weakness, light-headedness and headaches.  Hematological: Does not bruise/bleed easily.  All other systems reviewed and are negative    Past Medical History:  Diagnosis Date  . Dyslipidemia   . PAD (peripheral artery disease) ([place])   . PONV (postoperative nausea and vomiting)   . Psoriasis   . RA (rheumatoid arthritis) ([place])   . Vertigo    Past Surgical History:  Procedure Laterality Date  . ANEURYSM COILING Right    right toe   . BILIARY BRUSHING  05/16/2020   Procedure: BILIARY BRUSHING;  Surgeon: Rogene Houston, MD;  Location: [facility];  Service: Endoscopy;;  . BILIARY STENT PLACEMENT  01/05/2020   Procedure: BILIARY STENT PLACEMENT;  Surgeon: Rogene Houston, MD;  Location: [facility];  Service: Endoscopy;;  . BILIARY STENT PLACEMENT  05/16/2020   Procedure: BILIARY STENT PLACEMENT;  Surgeon: Rogene Houston, MD;  Location: [facility];  Service: Endoscopy;;  . CHOLECYSTECTOMY    . COLONOSCOPY  03/07/2015   Propofol; Surgeon: Dr. Laveda Norman; Three 6 mm rectosigmoid polyps, moderate left-sided diverticulosis, tortuous rectosigmoid colon. Pathology with inflamed hyperplastic polyp.  . COLONOSCOPY WITH PROPOFOL N/A 08/18/2019   non-bleeding internal hemorrhoids, pancolonic diverticulosis, two 4-7 mm polyps in rectum benign, no adenomas. Next colonoscopy in 10 years.   Marland Kitchen ERCP N/A 01/05/2020   Procedure: ENDOSCOPIC RETROGRADE CHOLANGIOPANCREATOGRAPHY (ERCP);  Surgeon: Rogene Houston, MD;  Location: [facility];  Service:  Endoscopy;  Laterality: N/A;  . ERCP N/A 05/16/2020   Procedure: ENDOSCOPIC RETROGRADE CHOLANGIOPANCREATOGRAPHY (ERCP);  Surgeon: Rogene Houston, MD;   Location: [facility];  Service: Endoscopy;  Laterality: N/A;  . GASTROINTESTINAL STENT REMOVAL N/A 05/16/2020   Procedure: STENT REMOVAL;  Surgeon: Rogene Houston, MD;  Location: [facility];  Service: Endoscopy;  Laterality: N/A;  . HYSTERECTOMY ABDOMINAL WITH SALPINGO-OOPHORECTOMY    . ILEOSTOMY  2005   per patient for bowel obstruction and peritonitis with anastomosis  . INSERTION OF MESH     RLQ  . POLYPECTOMY  08/18/2019   Procedure: POLYPECTOMY;  Surgeon: Daneil Dolin, MD;  [facility]: [facility];  Service: Endoscopy;;  . REMOVAL OF STONES N/A 01/05/2020   Procedure: REMOVAL OF STONES;  Surgeon: Rogene Houston, MD;  [facility];  Service: Endoscopy;  Laterality: N/A;  . SPHINCTEROTOMY N/A 01/05/2020   Procedure: SPHINCTEROTOMY;  Surgeon: Rogene Houston, MD;  [facility];  Service: Endoscopy;  Laterality: N/A;  . SPHINCTEROTOMY N/A 05/16/2020   Procedure: SPHINCTEROTOMY;  Surgeon: Rogene Houston, MD;  Location: [facility];  Service: Endoscopy;  Laterality: N/A;    Social History:  reports that she quit smoking about 21 months ago. Her smoking use included cigarettes. She has a 25.00 pack-year smoking history. She has never used smokeless tobacco. She reports previous alcohol use. She reports that she does not use drugs.   Allergies  Allergen Reactions  . Codeine     Headache-"draws line across forehead"    Family History  Problem Relation Age of Onset  . Lung cancer Mother   . Lung cancer Father   . Stomach cancer Brother   . Breast cancer Cousin   . Colon cancer Neg Hx     Prior to Admission medications   Medication Sig Start Date End Date Taking? Authorizing Provider  clopidogrel (PLAVIX) 75 MG tablet Take 75 mg by mouth daily.    Yes [provider]  Cyanocobalamin (VITAMIN B 12 PO) Take 3,000 mcg by mouth daily.    Yes [provider]  Cyanocobalamin (VITAMIN B-12 IJ) Inject 1,000 mcg into the muscle every 7 (seven) days.    Yes  [provider]  Dexlansoprazole 30 MG capsule Take 30 mg by mouth daily as needed (acid reflux).    Yes [provider]  dicyclomine (BENTYL) 10 MG capsule Take 1 capsule (10 mg total) by mouth 2 (two) times daily before a meal. 01/11/20  Yes Rehman, Mechele Dawley, MD  meclizine (ANTIVERT) 12.5 MG tablet Take 12.5 mg by mouth 3 (three) times daily as needed for dizziness.    Yes [provider]  metoCLOPramide (REGLAN) 10 MG tablet Take 10 mg by mouth every 8 (eight) hours as needed for nausea.   Yes [provider]  ondansetron (ZOFRAN) 4 MG tablet Take 4 mg by mouth every 8 (eight) hours as needed for nausea or vomiting.   Yes [provider]  Potassium 99 MG TABS Take 99 mg by mouth daily.   Yes [provider]  predniSONE (DELTASONE) 10 MG tablet Take 10 mg by mouth daily. 05/12/20  Yes [provider]  rosuvastatin (CRESTOR) 40 MG tablet Take 40 mg by mouth at bedtime.   Yes [provider]  sulfaSALAzine (AZULFIDINE) 500 MG tablet Take 500 mg by mouth 4 (four) times daily.  09/08/19  Yes [provider]  zinc gluconate 50 MG tablet Take 50 mg by mouth daily.  Yes [provider]  alendronate (FOSAMAX) 70 MG tablet Take 70 mg by mouth every Monday. Take with a full glass of water on an empty stomach.    [provider]  ergocalciferol (VITAMIN D2) 1.25 MG (50000 UT) capsule Take 50,000 Units by mouth every Tuesday.     [provider]    Physical Exam: BP (!) 128/57   Pulse 62   Temp 97.8 F (36.6 C) (Oral)   Resp 18   Ht 5\' 3"  (1.6 m)   Wt 73.5 kg   SpO2 98%   BMI 28.70 kg/m   . General: 64 y.o. year-old female well developed well nourished in no acute distress.  Alert and oriented x3. Marland Kitchen HEENT: NCAT, EOMI, PERRLA . Neck: Supple, trachea media . Cardiovascular: Regular rate and rhythm with no rubs or gallops.  No thyromegaly or JVD noted.  No lower extremity edema. 2/4 pulses in all  4 extremities. Marland Kitchen Respiratory: Clear to auscultation with no wheezes or rales. Good inspiratory effort. . Abdomen: Soft nontender nondistended with normal bowel sounds x4 quadrants. . Muskuloskeletal: No cyanosis, clubbing or edema noted bilaterally . Neuro: CN II-XII intact, strength, sensation, reflexes . Skin: No ulcerative lesions noted or rashes . Psychiatry: Judgement and insight appear normal. Mood is appropriate for condition and setting          Labs on Admission:  Basic Metabolic Panel: Recent Labs  Lab 05/25/20 2151  NA 136  K 3.8  CL 103  CO2 26  GLUCOSE 114*  BUN 13  CREATININE 0.80  CALCIUM 9.1   Liver Function Tests: Recent Labs  Lab 05/25/20 2151  AST 28  ALT 43  ALKPHOS 59  BILITOT 0.2*  PROT 6.8  ALBUMIN 3.8   Recent Labs  Lab 05/25/20 2151  LIPASE 39   No results for input(s): AMMONIA in the last 168 hours. CBC: Recent Labs  Lab 05/25/20 2151  WBC 17.3*  NEUTROABS 9.7*  HGB 9.8*  HCT 31.9*  MCV 89.9  PLT 692*   Cardiac Enzymes: No results for input(s): CKTOTAL, CKMB, CKMBINDEX, TROPONINI in the last 168 hours.  BNP (last 3 results) No results for input(s): BNP in the last 8760 hours.  ProBNP (last 3 results) No results for input(s): PROBNP in the last 8760 hours.  CBG: No results for input(s): GLUCAP in the last 168 hours.  Radiological Exams on Admission: No results found.  EKG: I independently viewed the EKG done and my findings are as followed: EKG was not done in the ED  Assessment/Plan Present on Admission: . GI bleed  Principal Problem:   GI bleed Active Problems:   Leukocytosis   Thrombocytosis   Normocytic anemia   GERD (gastroesophageal reflux disease)   Dyslipidemia   Vitamin D deficiency  Acute GI bleed Patient presents with 4-day onset of black tarry stool H/H= 9.8/31.9, this was 12.9/41.7 about 4 months ago Hemoccult was positive Patient denies any symptoms other than the black stool No indication  for transfusion at this time IV Protonix 40 Mg x1 was given in the ED; continue Protonix 40mg  twice daily Gastroenterologist will be consulted in the morning  Leukocytosis/Thrombocytosis possibly due to steroid effect WBC at 17.3, platelets 692 Patient states that she takes prednisone due to arthritis Continue to monitor WBC with no labs  Normocytic anemia H/H 9.8/31.9; stable  GERD Continue Protonix  Dyslipidemia Continue Crestor  Rheumatoid arthritis Continue home prednisone  Vitamin D deficiency  Start Fosamax and vitamin D 2  per home regimen if patient is not yet discharged by 11/22   DVT prophylaxis: SCD  Code Status: Full code  Family Communication: Husband at bedside (all questions answered to satisfaction)  Disposition Plan:  Patient is from:                        home Anticipated DC to:                   home Anticipated DC date:                1 day Anticipated DC barriers:           Patient is unstable to be discharged at this time due to GI bleed that require GI consult and recommendation   Consults called: Gastroenterology  Admission status: Observation    Bernadette Hoit MD [facility]  05/25/2020, 11:55 PM

## 2020-05-25 NOTE — Telephone Encounter (Signed)
Patient called the office to say that she had a Prednisone Injection about 4 days ago. She has noted that she had tarry stools. Questions if the injection may have caused this. She was advised to call the physician that gave the injection, she gets this for RA.  She states that Dr.Rehman had called her and told her to come and get a hemoccult card. She plans to come today before 11:30 am to get the cards and plans to return them Monday.  Patient was advised if she saw blood or started to have stomach pain to contact PCP or go to the ED. She ask that Dr.Rehman let her know who the doctor is that he is going to send her too.  Patient will be made aware after her questions are addressed with Dr.Rehman.

## 2020-05-26 DIAGNOSIS — K219 Gastro-esophageal reflux disease without esophagitis: Secondary | ICD-10-CM | POA: Diagnosis not present

## 2020-05-26 DIAGNOSIS — K921 Melena: Secondary | ICD-10-CM | POA: Diagnosis not present

## 2020-05-26 DIAGNOSIS — D649 Anemia, unspecified: Secondary | ICD-10-CM | POA: Diagnosis not present

## 2020-05-26 DIAGNOSIS — K922 Gastrointestinal hemorrhage, unspecified: Secondary | ICD-10-CM | POA: Diagnosis not present

## 2020-05-26 DIAGNOSIS — D72829 Elevated white blood cell count, unspecified: Secondary | ICD-10-CM | POA: Diagnosis not present

## 2020-05-26 LAB — COMPREHENSIVE METABOLIC PANEL
ALT: 39 U/L (ref 0–44)
AST: 23 U/L (ref 15–41)
Albumin: 3.5 g/dL (ref 3.5–5.0)
Alkaline Phosphatase: 55 U/L (ref 38–126)
Anion gap: 7 (ref 5–15)
BUN: 12 mg/dL (ref 8–23)
CO2: 27 mmol/L (ref 22–32)
Calcium: 9 mg/dL (ref 8.9–10.3)
Chloride: 106 mmol/L (ref 98–111)
Creatinine, Ser: 0.8 mg/dL (ref 0.44–1.00)
GFR, Estimated: >60 mL/min (ref >60)
Glucose, Bld: 102 mg/dL — ABNORMAL HIGH (ref 70–99)
Potassium: 4.6 mmol/L (ref 3.5–5.1)
Sodium: 140 mmol/L (ref 135–145)
Total Bilirubin: 0.4 mg/dL (ref 0.3–1.2)
Total Protein: 6.4 g/dL — ABNORMAL LOW (ref 6.5–8.1)

## 2020-05-26 LAB — MAGNESIUM: Magnesium: 2.1 mg/dL (ref 1.7–2.4)

## 2020-05-26 LAB — CBC
HCT: 31.1 % — ABNORMAL LOW (ref 36.0–46.0)
HCT: 32.2 % — ABNORMAL LOW (ref 36.0–46.0)
Hemoglobin: 9.4 g/dL — ABNORMAL LOW (ref 12.0–15.0)
Hemoglobin: 9.8 g/dL — ABNORMAL LOW (ref 12.0–15.0)
MCH: 27.2 pg (ref 26.0–34.0)
MCH: 27.5 pg (ref 26.0–34.0)
MCHC: 30.2 g/dL (ref 30.0–36.0)
MCHC: 30.4 g/dL (ref 30.0–36.0)
MCV: 90.1 fL (ref 80.0–100.0)
MCV: 90.2 fL (ref 80.0–100.0)
Platelets: 641 10*3/uL — ABNORMAL HIGH (ref 150–400)
Platelets: 710 10*3/uL — ABNORMAL HIGH (ref 150–400)
RBC: 3.45 MIL/uL — ABNORMAL LOW (ref 3.87–5.11)
RBC: 3.57 MIL/uL — ABNORMAL LOW (ref 3.87–5.11)
RDW: 14.6 % (ref 11.5–15.5)
RDW: 14.9 % (ref 11.5–15.5)
WBC: 14.3 10*3/uL — ABNORMAL HIGH (ref 4.0–10.5)
WBC: 16.4 10*3/uL — ABNORMAL HIGH (ref 4.0–10.5)
nRBC: 0 % (ref 0.0–0.2)
nRBC: 0 % (ref 0.0–0.2)

## 2020-05-26 LAB — PHOSPHORUS: Phosphorus: 3.6 mg/dL (ref 2.5–4.6)

## 2020-05-26 LAB — APTT: aPTT: 27 seconds (ref 24–36)

## 2020-05-26 LAB — ABO/RH: ABO/RH(D): A POS

## 2020-05-26 LAB — PROTIME-INR
INR: 1 (ref 0.8–1.2)
Prothrombin Time: 12.9 seconds (ref 11.4–15.2)

## 2020-05-26 LAB — HIV ANTIBODY (ROUTINE TESTING W REFLEX): HIV Screen 4th Generation wRfx: NONREACTIVE

## 2020-05-26 MED ORDER — PANTOPRAZOLE SODIUM 40 MG IV SOLR: 40.0000 mg | INTRAVENOUS | Status: DC

## 2020-05-26 MED ORDER — DEXLANSOPRAZOLE 30 MG PO CPDR
30.0000 mg | DELAYED_RELEASE_CAPSULE | Freq: Every day | ORAL | 5 refills | Status: DC
Start: 2020-05-26 — End: 2022-12-04

## 2020-05-26 MED ORDER — PANTOPRAZOLE SODIUM 40 MG IV SOLR
40.0000 mg | Freq: Two times a day (BID) | INTRAVENOUS | Status: DC
  Administered 2020-05-26: 40 mg via INTRAVENOUS
  Filled 2020-05-26: qty 40

## 2020-05-26 MED ORDER — PREDNISONE 10 MG PO TABS
10.0000 mg | ORAL_TABLET | Freq: Every day | ORAL | Status: DC
  Administered 2020-05-26: 10 mg via ORAL
  Filled 2020-05-26: qty 1

## 2020-05-26 MED ORDER — ROSUVASTATIN CALCIUM 20 MG PO TABS: 40.0000 mg | ORAL_TABLET | Freq: Every day | ORAL | Status: DC

## 2020-05-26 MED ORDER — CLOPIDOGREL BISULFATE 75 MG PO TABS
75.0000 mg | ORAL_TABLET | Freq: Every day | ORAL | 2 refills | Status: DC
Start: 2020-06-01 — End: 2022-12-04

## 2020-05-26 MED ORDER — IOHEXOL 300 MG/ML  SOLN
100.0000 mL | Freq: Once | INTRAMUSCULAR | Status: AC | PRN
  Administered 2020-05-26: 100 mL via INTRAVENOUS

## 2020-05-26 NOTE — Progress Notes (Signed)
Nsg Discharge Note  Admit Date:  05/25/2020 Discharge date: 05/26/2020   Olivia [name] to be D/C'd Home per MD order.  AVS completed.  Copy for chart, and copy for patient signed, and dated. Removed IV-clean, dry, intact. Reviewed d/c paperwork with patient. Answered all questions. Stable patient and belongings walked out of hospital accompanied by husband. Patient/caregiver able to verbalize understanding.  Discharge Medication: Allergies as of 05/26/2020      Reactions   Codeine    Headache-"draws line across forehead"      Medication List    TAKE these medications   alendronate 70 MG tablet Commonly known as: FOSAMAX Take 70 mg by mouth every Monday. Take with a full glass of water on an empty stomach.   clopidogrel 75 MG tablet Commonly known as: PLAVIX Take 1 tablet (75 mg total) by mouth daily. Hold Plavix until 06/01/2020, restart if no further dark stools/bleeding Start taking on: June 01, 2020 What changed:   additional instructions  These instructions start on June 01, 2020. If you are unsure what to do until then, ask your doctor or other care provider.   Dexlansoprazole 30 MG capsule Take 1 capsule (30 mg total) by mouth daily. Take every day What changed:   when to take this  reasons to take this  additional instructions   dicyclomine 10 MG capsule Commonly known as: Bentyl Take 1 capsule (10 mg total) by mouth 2 (two) times daily before a meal.   ergocalciferol 1.25 MG (50000 UT) capsule Commonly known as: VITAMIN D2 Take 50,000 Units by mouth every Tuesday.   meclizine 12.5 MG tablet Commonly known as: ANTIVERT Take 12.5 mg by mouth 3 (three) times daily as needed for dizziness.   metoCLOPramide 10 MG tablet Commonly known as: REGLAN Take 10 mg by mouth every 8 (eight) hours as needed for nausea.   ondansetron 4 MG tablet Commonly known as: ZOFRAN Take 4 mg by mouth every 8 (eight) hours as needed for nausea or vomiting.   Potassium  99 MG Tabs Take 99 mg by mouth daily.   predniSONE 10 MG tablet Commonly known as: DELTASONE Take 10 mg by mouth daily.   rosuvastatin 40 MG tablet Commonly known as: CRESTOR Take 40 mg by mouth at bedtime.   sulfaSALAzine 500 MG tablet Commonly known as: AZULFIDINE Take 500 mg by mouth 4 (four) times daily.   VITAMIN B-12 IJ Inject 1,000 mcg into the muscle every 7 (seven) days.   VITAMIN B 12 PO Take 3,000 mcg by mouth daily.   zinc gluconate 50 MG tablet Take 50 mg by mouth daily.       Discharge Assessment: Vitals:   05/26/20 0218 05/26/20 0624  BP: (!) 146/58 (!) 120/57  Pulse: 64 74  Resp: 16 18  Temp: 97.8 F (36.6 C) 97.7 F (36.5 C)  SpO2: 100% 100%   Skin clean, dry and intact without evidence of skin break down, no evidence of skin tears noted. IV catheter discontinued intact. Site without signs and symptoms of complications - no redness or edema noted at insertion site, patient denies c/o pain - only slight tenderness at site.  Dressing with slight pressure applied.  D/c Instructions-Education: Discharge instructions given to patient/family with verbalized understanding. D/c education completed with patient/family including follow up instructions, medication list, d/c activities limitations if indicated, with other d/c instructions as indicated by MD - patient able to verbalize understanding, all questions fully answered. Patient instructed to return to ED, call 911, or  call MD for any changes in condition.  Patient escorted via [place], and D/C home via private auto.  Santa Lighter, RN 05/26/2020 5:53 PM

## 2020-05-26 NOTE — Discharge Summary (Signed)
Olivia [name], is a 64 y.o. female  DOB 16-May-1956  MRN [id number].  Admission date:  05/25/2020  Admitting Physician  Olivia Hoit, DO  Discharge Date:  05/26/2020   Primary MD  Olivia [name]  Recommendations for primary care physician for things to follow:   1)Hold Plavix until 06/01/2020, restart if no further dark stools/bleeding 2) take Dexilant/dexlansoprazole-30 mg every Olivia with breakfast 3)Avoid ibuprofen/Advil/Aleve/Motrin/Goody Powders/Naproxen/BC powders/Meloxicam/Diclofenac/Indomethacin and other Nonsteroidal anti-inflammatory medications as these will make you more likely to bleed and can cause stomach ulcers, can also cause Kidney problems.  4) repeat CBC BMP blood test with primary care physician within a week 5) follow-up with your gastroenterologist Dr. Laural [name] as previously advised  Admission Diagnosis  Melena [K92.1] GI bleed [K92.2]   Discharge Diagnosis  Melena [K92.1] GI bleed [K92.2]    Principal Problem:   GI bleed Active Problems:   Leukocytosis   Thrombocytosis   Normocytic anemia   GERD (gastroesophageal reflux disease)   Dyslipidemia   Vitamin D deficiency   Melena      Past Medical History:  Diagnosis Date  . Dyslipidemia   . PAD (peripheral artery disease) (Olivia [name])   . PONV (postoperative nausea and vomiting)   . Psoriasis   . RA (rheumatoid arthritis) ([place])   . Vertigo     Past Surgical History:  Procedure Laterality Date  . ANEURYSM COILING Right    right toe   . BILIARY BRUSHING  05/16/2020   Procedure: BILIARY BRUSHING;  Surgeon: Olivia Houston, MD;  Location: [facility];  Service: Endoscopy;;  . BILIARY STENT PLACEMENT  01/05/2020   Procedure: BILIARY STENT PLACEMENT;  Surgeon: Olivia Houston, MD;  [facility];  Service: Endoscopy;;  . BILIARY STENT PLACEMENT  05/16/2020   Procedure: BILIARY STENT PLACEMENT;   Surgeon: Olivia Houston, MD;  Location: [facility];  Service: Endoscopy;;  . CHOLECYSTECTOMY    . COLONOSCOPY  03/07/2015   Propofol; Surgeon: Dr. Laveda Norman; Three 6 mm rectosigmoid polyps, moderate left-sided diverticulosis, tortuous rectosigmoid colon. Pathology with inflamed hyperplastic polyp.  . COLONOSCOPY WITH PROPOFOL N/A 08/18/2019   non-bleeding internal hemorrhoids, pancolonic diverticulosis, two 4-7 mm polyps in rectum benign, no adenomas. Next colonoscopy in 10 years.   Marland Kitchen ERCP N/A 01/05/2020   Procedure: ENDOSCOPIC RETROGRADE CHOLANGIOPANCREATOGRAPHY (ERCP);  Surgeon: Olivia Houston, MD;  [facility];  Service: Endoscopy;  Laterality: N/A;  . ERCP N/A 05/16/2020   Procedure: ENDOSCOPIC RETROGRADE CHOLANGIOPANCREATOGRAPHY (ERCP);  Surgeon: Olivia Houston, MD;  Location: [facility];  Service: Endoscopy;  Laterality: N/A;  . GASTROINTESTINAL STENT REMOVAL N/A 05/16/2020   Procedure: STENT REMOVAL;  Surgeon: Olivia Houston, MD;  Location: [facility];  Service: Endoscopy;  Laterality: N/A;  . HYSTERECTOMY ABDOMINAL WITH SALPINGO-OOPHORECTOMY    . ILEOSTOMY  2005   per patient for bowel obstruction and peritonitis with anastomosis  . INSERTION OF MESH     RLQ  . POLYPECTOMY  08/18/2019   Procedure: POLYPECTOMY;  Surgeon: Manus Rudd  M, MD;  [facility];  Service: Endoscopy;;  . REMOVAL OF STONES N/A 01/05/2020   Procedure: REMOVAL OF STONES;  Surgeon: Olivia Houston, MD;  [facility];  Service: Endoscopy;  Laterality: N/A;  . SPHINCTEROTOMY N/A 01/05/2020   Procedure: SPHINCTEROTOMY;  Surgeon: Olivia Houston, MD;  [facility];  Service: Endoscopy;  Laterality: N/A;  . SPHINCTEROTOMY N/A 05/16/2020   Procedure: SPHINCTEROTOMY;  Surgeon: Olivia Houston, MD;  Location: [facility];  Service: Endoscopy;  Laterality: N/A;     HPI  from the history and physical done on [name] of admission:   Chief Complaint: Black tarry stool  HPI: Olivia [name] is  a 64 y.o. 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  She follows with Dr. Arthor Captain and she called his office yesterday (11/18) regarding the black stool, patient was asked to stop by the office to pick up Hemoccult card and bring a stool sample for follow-up.  She also called her rheumatologist who advised her to go to the ED for further evaluation and management.  Patient denies any abdominal pain, she denies being on any iron tablets or Pepto-Bismol.  She was on Plavix, but she has stopped taking this prior to having an ERCP with stent removal and/or exchange on 05/16/2020.  ED Course:  In the emergency department, she was hemodynamically stable.  Work-up in the ED showed leukocytosis, thrombocytosis and normocytic anemia.  Fecal occult blood was positive.  IV Protonix 40 Mg x1 was given.  Hospitalist was asked to admit.  For further evaluation and management.     Hospital Course:   1) acute blood loss anemia--- hemoglobin currently stable around 9.8, baseline usually above 12 -Patient underwent ERCP with sphincterotomy on 05/16/2020, she did resume Plavix after procedure -GI consult from Dr. Gala Romney appreciated, as per GI physician patient likely suffered a post sphincterotomy GI bleed. Bleeding seems to have ceased spontaneously with cessation of Plavix. -Okay to hold Plavix until 06/01/2020, may restart at that time if no further bleeding -Repeat CBC and BMP with PCP within a week advised -Patient has colonoscopy in February 2021 with removal of 2 small hyperplastic benign polyps, patient was also found at that time to have  pancolonic diverticulosis and hemorrhoids -No further GI bleed or bowel movement since admission -No Abdominal pain -Discharge on Dexilant PPI  2)RA--continue prednisone for rheumatoid arthritis  3)Thrombocytosis and leukocytosis--- may be related to reactive/stress reaction in setting of GI bleed compounded by ongoing prednisone use for rheumatoid arthritis  4) osteopenia and vitamin D deficiency----continue vitamin D supplements, calcium supplements and Fosamax  4)HLD--continue Crestor  Discharge Condition: Stable  Follow UP   Follow-up Information    [facility]. Schedule an appointment as soon as possible for a visit in 1 week(s).   Specialty: Family Medicine Why: CBC and BMP in 1 week Contact information: [place] [id number] [phone]        Olivia Houston, MD. Schedule an appointment as soon as possible for a visit in 3 week(s).   Specialty: Gastroenterology Contact information: [place], [place] [address] [phone]  Consults obtained -GI  Diet and Activity recommendation:  As advised  Discharge Instructions     Discharge Instructions    Call MD for:  difficulty breathing, headache or visual disturbances   Complete by: As directed    Call MD for:  persistant dizziness or light-headedness   Complete by: As directed    Call MD for:  persistant nausea and vomiting   Complete by: As directed    Call MD for:  severe uncontrolled pain   Complete by: As directed    Call MD for:  temperature >100.4   Complete by: As directed    Diet - low sodium heart healthy   Complete by: As directed    Discharge instructions   Complete by: As directed    1)Hold Plavix until 06/01/2020, restart if no further dark stools/bleeding 2) take Dexilant/dexlansoprazole-30 mg every Olivia with breakfast 3)Avoid ibuprofen/Advil/Aleve/Motrin/Goody Powders/Naproxen/BC  powders/Meloxicam/Diclofenac/Indomethacin and other Nonsteroidal anti-inflammatory medications as these will make you more likely to bleed and can cause stomach ulcers, can also cause Kidney problems.  4) repeat CBC BMP blood test with primary care physician within a week 5) follow-up with your gastroenterologist Dr. Laural [name] as previously advised   Increase activity slowly   Complete by: As directed         Discharge Medications     Allergies as of 05/26/2020      Reactions   Codeine    Headache-"draws line across forehead"      Medication List    TAKE these medications   alendronate 70 MG tablet Commonly known as: FOSAMAX Take 70 mg by mouth every Monday. Take with a full glass of water on an empty stomach.   clopidogrel 75 MG tablet Commonly known as: PLAVIX Take 1 tablet (75 mg total) by mouth daily. Hold Plavix until 06/01/2020, restart if no further dark stools/bleeding Start taking on: June 01, 2020 What changed:   additional instructions  These instructions start on June 01, 2020. If you are unsure what to do until then, ask your doctor or other care provider.   Dexlansoprazole 30 MG capsule Take 1 capsule (30 mg total) by mouth daily. Take every Olivia What changed:   when to take this  reasons to take this  additional instructions   dicyclomine 10 MG capsule Commonly known as: Bentyl Take 1 capsule (10 mg total) by mouth 2 (two) times daily before a meal.   ergocalciferol 1.25 MG (50000 UT) capsule Commonly known as: VITAMIN D2 Take 50,000 Units by mouth every Tuesday.   meclizine 12.5 MG tablet Commonly known as: ANTIVERT Take 12.5 mg by mouth 3 (three) times daily as needed for dizziness.   metoCLOPramide 10 MG tablet Commonly known as: REGLAN Take 10 mg by mouth every 8 (eight) hours as needed for nausea.   ondansetron 4 MG tablet Commonly known as: ZOFRAN Take 4 mg by mouth every 8 (eight) hours as needed for nausea or vomiting.     Potassium 99 MG Tabs Take 99 mg by mouth daily.   predniSONE 10 MG tablet Commonly known as: DELTASONE Take 10 mg by mouth daily.   rosuvastatin 40 MG tablet Commonly known as: CRESTOR Take 40 mg by mouth at bedtime.   sulfaSALAzine 500 MG tablet Commonly known as: AZULFIDINE Take 500 mg by mouth 4 (four) times daily.   VITAMIN B-12 IJ Inject 1,000 mcg into the muscle every 7 (seven) days.   VITAMIN B 12 PO Take 3,000 mcg by mouth daily.  zinc gluconate 50 MG tablet Take 50 mg by mouth daily.       Major procedures and Radiology Reports - PLEASE review detailed and final reports for all details, in brief -   CT Abdomen Pelvis W Contrast  Result Date: 05/26/2020 CLINICAL DATA:  Initial evaluation for melena. EXAM: CT ABDOMEN AND PELVIS WITH CONTRAST TECHNIQUE: Multidetector CT imaging of the abdomen and pelvis was performed using the standard protocol following bolus administration of intravenous contrast. CONTRAST:  144mL OMNIPAQUE IOHEXOL 300 MG/ML  SOLN COMPARISON:  Prior CT from 01/19/2020. FINDINGS: Lower chest: Mild scattered subsegmental atelectatic changes noted within the visualized lung bases. Visualized lungs are otherwise clear. Scattered coronary artery calcifications partially visualized. Hepatobiliary: Liver demonstrates a normal contrast enhanced appearance. Gallbladder surgically absent. Biliary stent in place within the common bile duct. Stent appears to be well positioned. Associated pneumobilia throughout the intra and extrahepatic biliary duct system. No significant intrahepatic biliary dilatation to suggest stent failure or clogging. Pancreas: Pancreas within normal limits. Spleen: Spleen within normal limits. Adrenals/Urinary Tract: Adrenal glands are normal. Kidneys equal size with symmetric enhancement. Multiple scattered subcentimeter hypodensities noted about the kidneys bilaterally, too small the characterize, but statistically likely reflects small  cyst. No focal enhancing renal mass. Few punctate nonobstructive calculi noted within the kidneys bilaterally, largest of which seen at the interpolar left kidney and measures 3 mm. No radiopaque calculi seen along the course of either renal collecting system. No hydronephrosis or hydroureter. Partially distended bladder within normal limits. Stomach/Bowel: Stomach within normal limits. Distal aspect of the biliary stent in place within the third portion of the duodenum. No evidence for bowel obstruction. Normal appendix. Extensive diverticulosis seen about the descending and sigmoid colon. No associated inflammatory changes to suggest acute diverticulitis at this time. No acute inflammatory changes seen elsewhere about the bowels. 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 No aneurysm. Mesenteric vessels patent proximally. No adenopathy. Reproductive: Uterus is absent. Ovaries not confidently identified. No adnexal mass. Other: No free air or fluid. Sequelae of prior ventral hernia repair with mesh in place. Diastasis of the rectus abdominus musculature at the level of the umbilicus without recurrent hernia. Musculoskeletal: No acute osseous abnormality. No worrisome lytic or blastic osseous lesions. Sclerotic change about the SI joints bilaterally suggestive of underlying sacroiliitis. IMPRESSION: 1. No CT evidence for acute intra-abdominal or pelvic process. 2. Colonic diverticulosis without evidence for acute diverticulitis. 3. Biliary stent in place within the common bile duct with associated pneumobilia. No significant intrahepatic biliary dilatation to suggest stent failure or clogging. 4. Bilateral nonobstructive nephrolithiasis. 5. 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 No aneurysm. Aortic Atherosclerosis (ICD10-I70.0). Electronically Signed   By: Jeannine Boga M.D.   On: 05/26/2020 04:41   DG ERCP  Result Date: 05/16/2020 CLINICAL  DATA:  Common bile duct stricture and previous placement of endoscopic common bile duct stent. EXAM: ERCP TECHNIQUE: Multiple spot images obtained with the fluoroscopic device and submitted for interpretation post-procedure. COMPARISON:  Prior ERCP on 01/05/2020 FINDINGS: Imaging with a C-arm demonstrates removal of an indwelling endoscopic CBD stent. Subsequent cannulation and opacification of the common bile duct demonstrate significant dilatation of the opacified duct and evidence of a probable distal CBD stricture. A new endoscopic biliary stent was placed across the distal CBD stricture. IMPRESSION: Evidence of relative biliary obstruction secondary to a distal common bile duct stricture. After removal of an indwelling endoscopic CBD stent, a new endoscopic stent was placed across the stricture. These images were  submitted for radiologic interpretation only. Please see the procedural report for the amount of contrast and the fluoroscopy time utilized. Electronically Signed   By: Aletta Edouard M.D.   On: 05/16/2020 16:57    Micro Results  Recent Results (from the past 240 hour(s))  Respiratory Panel by RT PCR (Flu A&B, Covid) -     Status: None   Collection Time: 05/25/20 10:15 PM  Result Value Ref Range Status   SARS Coronavirus 2 by RT PCR NEGATIVE NEGATIVE Final   Influenza A by PCR NEGATIVE NEGATIVE Final   Influenza B by PCR NEGATIVE NEGATIVE Final    Comment: Performed at [facility], [address] [place], [place] [address]     Today   Subjective    Olivia [name] today has no new complaints   -No further BMs no abdominal pain no vomiting eating and drinking well        Patient has been seen and examined prior to discharge   Objective   Blood pressure (!) 120/57, pulse 74, temperature 97.7 F (36.5 C), resp. rate 18, height 5\' 3"  (1.6 m), weight 73.5 kg, SpO2 100 %.   Intake/Output Summary (Last 24 hours) at 05/26/2020 1551 Last data filed at 05/26/2020 1506 Gross per  24 hour  Intake 480 ml  Output --  Net 480 ml    Exam Gen:- Awake Alert, no acute distress  HEENT:- [place].AT, No sclera icterus Neck-Supple Neck,No JVD,.  Lungs-  CTAB , good air movement bilaterally  CV- S1, S2 normal, regular Abd-  +ve B.Sounds, Abd Soft, No tenderness,    Extremity/Skin:- No  edema,   good pulses Psych-affect is appropriate, oriented x3 Neuro-no new focal deficits, no tremors    Data Review   CBC w Diff:  Lab Results  Component Value Date   WBC 16.4 (H) 05/26/2020   HGB 9.8 (L) 05/26/2020   HGB 13.7 12/07/2019   HCT 32.2 (L) 05/26/2020   HCT 44.4 12/07/2019   PLT 710 (H) 05/26/2020   PLT 580 (H) 12/07/2019   LYMPHOPCT 31 05/25/2020   MONOPCT 10 05/25/2020   EOSPCT 1 05/25/2020   BASOPCT 1 05/25/2020    CMP:  Lab Results  Component Value Date   NA 140 05/26/2020   NA 143 12/07/2019   K 4.6 05/26/2020   CL 106 05/26/2020   CO2 27 05/26/2020   BUN 12 05/26/2020   BUN 15 12/07/2019   CREATININE 0.80 05/26/2020   PROT 6.4 (L) 05/26/2020   PROT 7.3 12/07/2019   ALBUMIN 3.5 05/26/2020   ALBUMIN 4.3 12/07/2019   BILITOT 0.4 05/26/2020   BILITOT <0.2 12/07/2019   ALKPHOS 55 05/26/2020   AST 23 05/26/2020   ALT 39 05/26/2020  .   Total Discharge time is about 33 minutes  Roxan Hockey M.D on 05/26/2020 at 3:51 PM  Go to [email or web address] -  for contact info  [facility] - Office  [phone]

## 2020-05-26 NOTE — H&P (Signed)
Referring Provider: No ref. provider found Primary Care Physician:  [place] Primary Gastroenterologist:  Dr. 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 Noted melena started on 11/15. Patient states she contacted Dr. Olevia Perches office on 11/18 and Hemoccult testing of stool was to be arranged. She spoke to her rheumatologist who told her to come to the hospital. In the ED, she was found to be hemodynamically stable and Hemoccult positive. H&H 9.8/31.9; 12.9/41.7 a few months ago. LFTs normal. Patient denies abdominal pain. No upper GI tract abnormalities noted at the time of ERCP aside from an occluded plastic biliary stent. Colonoscopy performed by me back in February of this year for low-volume hematochezia demonstrated pancolonic diverticulosis and hemorrhoids. 2 small polyps removed (hyperplastic/benign) Patient has not had any bowel movement whatsoever since being admitted to the floor last night. H&H this morning 9.4/31.1; white count 14.3-down from 17.3 yesterday.  Chronically, patient has history of GERD - well controlled on Dexilant 30 mg daily. She has not had any abdominal pain since her ERCP. No nausea, vomiting, odynophagia or dysphagia.   Past Medical History:  Diagnosis Date  . Dyslipidemia   . PAD (peripheral artery disease) ([place])   . PONV (postoperative nausea and vomiting)   . Psoriasis   . RA (rheumatoid arthritis) ([place])   . Vertigo     Past Surgical History:  Procedure Laterality Date  . ANEURYSM COILING Right    right toe   . BILIARY BRUSHING  05/16/2020   Procedure: BILIARY BRUSHING;  Surgeon: Rogene Houston, MD;  Location: [facility];  Service: Endoscopy;;  . BILIARY STENT PLACEMENT  01/05/2020   Procedure: BILIARY  STENT PLACEMENT;  Surgeon: Rogene Houston, MD;  Location: [facility];  Service: Endoscopy;;  . BILIARY STENT PLACEMENT  05/16/2020   Procedure: BILIARY STENT PLACEMENT;  Surgeon: Rogene Houston, MD;  Location: [facility];  Service: Endoscopy;;  . CHOLECYSTECTOMY    . COLONOSCOPY  03/07/2015   Propofol; Surgeon: Dr. Laveda Norman; Three 6 mm rectosigmoid polyps, moderate left-sided diverticulosis, tortuous rectosigmoid colon. Pathology with inflamed hyperplastic polyp.  . COLONOSCOPY WITH PROPOFOL N/A 08/18/2019   non-bleeding internal hemorrhoids, pancolonic diverticulosis, two 4-7 mm polyps in rectum benign, no adenomas. Next colonoscopy in 10 years.   Marland Kitchen ERCP N/A 01/05/2020   Procedure: ENDOSCOPIC RETROGRADE CHOLANGIOPANCREATOGRAPHY (ERCP);  Surgeon: Rogene Houston, MD;  Location: [facility];  Service: Endoscopy;  Laterality: N/A;  . ERCP N/A 05/16/2020   Procedure: ENDOSCOPIC RETROGRADE CHOLANGIOPANCREATOGRAPHY (ERCP);  Surgeon: Rogene Houston, MD;  Location: [facility];  Service: Endoscopy;  Laterality: N/A;  . GASTROINTESTINAL STENT REMOVAL N/A 05/16/2020   Procedure: STENT REMOVAL;  Surgeon: Rogene Houston, MD;  Location: [facility];  Service: Endoscopy;  Laterality: N/A;  . HYSTERECTOMY ABDOMINAL WITH SALPINGO-OOPHORECTOMY    . ILEOSTOMY  2005   per patient for bowel obstruction and peritonitis with anastomosis  . INSERTION OF MESH     RLQ  . POLYPECTOMY  08/18/2019   Procedure: POLYPECTOMY;  Surgeon: Daneil Dolin, MD;  [facility];  Service: Endoscopy;;  . REMOVAL OF STONES N/A 01/05/2020   Procedure: REMOVAL OF STONES;  Surgeon: Rogene Houston, MD;  [facility];  Service: Endoscopy;  Laterality: N/A;  . SPHINCTEROTOMY N/A  01/05/2020   Procedure: SPHINCTEROTOMY;  Surgeon: Rogene Houston, MD;  [facility];  Service: Endoscopy;  Laterality: N/A;  . SPHINCTEROTOMY N/A 05/16/2020   Procedure: SPHINCTEROTOMY;  Surgeon: Rogene Houston, MD;  Location: [facility];  Service: Endoscopy;  Laterality: N/A;    Prior to Admission medications   Medication Sig Start Date End Date Taking? Authorizing Provider  clopidogrel (PLAVIX) 75 MG tablet Take 75 mg by mouth daily.    Yes [provider]  Cyanocobalamin (VITAMIN B 12 PO) Take 3,000 mcg by mouth daily.    Yes [provider]  Cyanocobalamin (VITAMIN B-12 IJ) Inject 1,000 mcg into the muscle every 7 (seven) days.    Yes [provider]  Dexlansoprazole 30 MG capsule Take 30 mg by mouth daily as needed (acid reflux).    Yes [provider]  dicyclomine (BENTYL) 10 MG capsule Take 1 capsule (10 mg total) by mouth 2 (two) times daily before a meal. 01/11/20  Yes Rehman, Mechele Dawley, MD  meclizine (ANTIVERT) 12.5 MG tablet Take 12.5 mg by mouth 3 (three) times daily as needed for dizziness.    Yes [provider]  metoCLOPramide (REGLAN) 10 MG tablet Take 10 mg by mouth every 8 (eight) hours as needed for nausea.   Yes [provider]  ondansetron (ZOFRAN) 4 MG tablet Take 4 mg by mouth every 8 (eight) hours as needed for nausea or vomiting.   Yes [provider]  Potassium 99 MG TABS Take 99 mg by mouth daily.   Yes [provider]  predniSONE (DELTASONE) 10 MG tablet Take 10 mg by mouth daily. 05/12/20  Yes [provider]  rosuvastatin (CRESTOR) 40 MG tablet Take 40 mg by mouth at bedtime.   Yes [provider]  sulfaSALAzine (AZULFIDINE) 500 MG tablet Take 500 mg by mouth 4 (four) times daily.  09/08/19  Yes [provider]  zinc gluconate 50 MG tablet Take 50 mg by mouth daily.   Yes [provider]  alendronate (FOSAMAX) 70 MG tablet Take 70 mg by mouth every Monday. Take with a full glass of water on an empty stomach.    [provider]  ergocalciferol (VITAMIN D2) 1.25 MG (50000 UT) capsule Take 50,000 Units by mouth every Tuesday.     [provider]    Current Facility-Administered  Medications  Medication Dose Route Frequency Provider Last Rate Last Admin  . pantoprazole (PROTONIX) injection 40 mg  40 mg Intravenous Q12H Emokpae, Courage, MD   40 mg at 05/26/20 0925  . predniSONE (DELTASONE) tablet 10 mg  10 mg Oral Daily Adefeso, Oladapo, DO   10 mg at 05/26/20 0925  . rosuvastatin (CRESTOR) tablet 40 mg  40 mg Oral QHS Adefeso, Oladapo, DO        Allergies as of 05/25/2020 - Review Complete 05/25/2020  Allergen Reaction Noted  . Codeine  05/02/2019    Family History  Problem Relation Age of Onset  . Lung cancer Mother   . Lung cancer Father   . Stomach cancer Brother   . Breast cancer Cousin   . Colon cancer Neg Hx     Social History   Socioeconomic History  . Marital status: Married    Spouse name: Not on file  . Number of children: Not on file  . Years of education: Not on file  . Highest education level: Not on file  Occupational History  . Not on file  Tobacco Use  .  Smoking status: Former Smoker    Packs/day: 0.50    Years: 50.00    Pack years: 25.00    Types: Cigarettes    Quit date: 08/14/2018    Years since quitting: 1.7  . Smokeless tobacco: Never Used  Vaping Use  . Vaping Use: Never used  Substance and Sexual Activity  . Alcohol use: Not Currently  . Drug use: Never  . Sexual activity: Yes  Other Topics Concern  . Not on file  Social History Narrative  . Not on file   Social Determinants of Health   Financial Resource Strain:   . Difficulty of Paying Living Expenses: Not on file  Food Insecurity:   . Worried About Charity fundraiser in the Last Year: Not on file  . Ran Out of Food in the Last Year: Not on file  Transportation Needs:   . Lack of Transportation (Medical): Not on file  . Lack of Transportation (Non-Medical): Not on file  Physical Activity:   . Days of Exercise per Week: Not on file  . Minutes of Exercise per Session: Not on file  Stress:   . Feeling of Stress : Not on file  Social Connections:   .  Frequency of Communication with Friends and Family: Not on file  . Frequency of Social Gatherings with Friends and Family: Not on file  . Attends Religious Services: Not on file  . Active Member of Clubs or Organizations: Not on file  . Attends Archivist Meetings: Not on file  . Marital Status: Not on file  Intimate Partner Violence:   . Fear of Current or Ex-Partner: Not on file  . Emotionally Abused: Not on file  . Physically Abused: Not on file  . Sexually Abused: Not on file    Review of Systems: As in history of present illness.  Physical Exam: Vital signs in last 24 hours: Temp:  [97.7 F (36.5 C)-97.8 F (36.6 C)] 97.7 F (36.5 C) (11/20 0624) Pulse Rate:  [60-74] 74 (11/20 0624) Resp:  [16-18] 18 (11/20 0624) BP: (120-149)/(48-81) 120/57 (11/20 0624) SpO2:  [98 %-100 %] 100 % (11/20 0624) Weight:  [73.5 kg] 73.5 kg (11/19 2043)   General:   Alert,  Well-developed, well-nourished, pleasant and cooperative in NAD Eyes:  Sclera clear, no icterus.   Conjunctiva pink. Lungs:  Clear throughout to auscultation.   No wheezes, crackles, or rhonchi. No acute distress. Heart:  Regular rate and rhythm; no murmurs, clicks, rubs,  or gallops. Abdomen:  Nondistended. Positive bowel sounds. No masses, hepatosplenomegaly or hernias noted. Soft and nontender.  Intake/Output from previous day: No intake/output data recorded. Intake/Output this shift: No intake/output data recorded.  Lab Results: Recent Labs    05/25/20 2151 05/26/20 0631  WBC 17.3* 14.3*  HGB 9.8* 9.4*  HCT 31.9* 31.1*  PLT 692* 641*   BMET Recent Labs    05/25/20 2151 05/26/20 0631  NA 136 140  K 3.8 4.6  CL 103 106  CO2 26 27  GLUCOSE 114* 102*  BUN 13 12  CREATININE 0.80 0.80  CALCIUM 9.1 9.0   LFT Recent Labs    05/26/20 0631  PROT 6.4*  ALBUMIN 3.5  AST 23  ALT 39  ALKPHOS 55  BILITOT 0.4   PT/INR Recent Labs    05/26/20 0631  LABPROT 12.9  INR 1.0   Hepatitis  Panel No results for input(s): HEPBSAG, HCVAB, HEPAIGM, HEPBIGM in the last 72 hours. C-Diff No results for input(s):  CDIFFTOX in the last 72 hours.  Studies/Results: CT Abdomen Pelvis W Contrast  Result Date: 05/26/2020 CLINICAL DATA:  Initial evaluation for melena. EXAM: CT ABDOMEN AND PELVIS WITH CONTRAST TECHNIQUE: Multidetector CT imaging of the abdomen and pelvis was performed using the standard protocol following bolus administration of intravenous contrast. CONTRAST:  170mL OMNIPAQUE IOHEXOL 300 MG/ML  SOLN COMPARISON:  Prior CT from 01/19/2020. FINDINGS: Lower chest: Mild scattered subsegmental atelectatic changes noted within the visualized lung bases. Visualized lungs are otherwise clear. Scattered coronary artery calcifications partially visualized. Hepatobiliary: Liver demonstrates a normal contrast enhanced appearance. Gallbladder surgically absent. Biliary stent in place within the common bile duct. Stent appears to be well positioned. Associated pneumobilia throughout the intra and extrahepatic biliary duct system. No significant intrahepatic biliary dilatation to suggest stent failure or clogging. Pancreas: Pancreas within normal limits. Spleen: Spleen within normal limits. Adrenals/Urinary Tract: Adrenal glands are normal. Kidneys equal size with symmetric enhancement. Multiple scattered subcentimeter hypodensities noted about the kidneys bilaterally, too small the characterize, but statistically likely reflects small cyst. No focal enhancing renal mass. Few punctate nonobstructive calculi noted within the kidneys bilaterally, largest of which seen at the interpolar left kidney and measures 3 mm. No radiopaque calculi seen along the course of either renal collecting system. No hydronephrosis or hydroureter. Partially distended bladder within normal limits. Stomach/Bowel: Stomach within normal limits. Distal aspect of the biliary stent in place within the third portion of the duodenum. No  evidence for bowel obstruction. Normal appendix. Extensive diverticulosis seen about the descending and sigmoid colon. No associated inflammatory changes to suggest acute diverticulitis at this time. No acute inflammatory changes seen elsewhere about the bowels. 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 No aneurysm. Mesenteric vessels patent proximally. No adenopathy. Reproductive: Uterus is absent. Ovaries not confidently identified. No adnexal mass. Other: No free air or fluid. Sequelae of prior ventral hernia repair with mesh in place. Diastasis of the rectus abdominus musculature at the level of the umbilicus without recurrent hernia. Musculoskeletal: No acute osseous abnormality. No worrisome lytic or blastic osseous lesions. Sclerotic change about the SI joints bilaterally suggestive of underlying sacroiliitis. IMPRESSION: 1. No CT evidence for acute intra-abdominal or pelvic process. 2. Colonic diverticulosis without evidence for acute diverticulitis. 3. Biliary stent in place within the common bile duct with associated pneumobilia. No significant intrahepatic biliary dilatation to suggest stent failure or clogging. 4. Bilateral nonobstructive nephrolithiasis. 5. 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 No aneurysm. Aortic Atherosclerosis (ICD10-I70.0). Electronically Signed   By: Jeannine Boga M.D.   On: 05/26/2020 04:41    Impression: Pleasant 64 year old lady presents to the hospital with a 5-day history of melena and declining hemoglobin which started a few days after she underwent an ERCP with sphincterotomy and early resumption of antiplatelet therapy in the way of Plavix. Most likely she has suffered a post sphincterotomy GI bleed. Bleeding seems to have ceased spontaneously with cessation of Plavix. She looks good this morning and tolerated a regular breakfast. She has a history of GERD historically well controlled on Dexilant 60  mg daily.  Recommendations: Would empirically withhold Plavix for 5 more days before restarting as clinically indicated.  No need for an EGD at this time.  Continue Dexilant 30 mg daily as an outpatient.  As discussed with Dr. Joesph Fillers, if she remains stable later today, she can go home from a GI standpoint.  Follow-up with Dr. Laural Golden regarding stent/stricture management.  Notice:  This dictation was prepared with Dragon dictation along with smaller phrase technology. Any transcriptional errors that result from this process are unintentional and may not be corrected upon review.

## 2020-05-26 NOTE — Plan of Care (Signed)
°  Problem: Education: Goal: Knowledge of General Education information will improve Description: Including pain rating scale, medication(s)/side effects and non-pharmacologic comfort measures 05/26/2020 1603 by Santa Lighter, RN Outcome: Adequate for Discharge 05/26/2020 0926 by Santa Lighter, RN Outcome: Progressing   Problem: Health Behavior/Discharge Planning: Goal: Ability to manage health-related needs will improve 05/26/2020 1603 by Santa Lighter, RN Outcome: Adequate for Discharge 05/26/2020 0926 by Santa Lighter, RN Outcome: Progressing   Problem: Clinical Measurements: Goal: Ability to maintain clinical measurements within normal limits will improve Outcome: Adequate for Discharge Goal: Will remain free from infection Outcome: Adequate for Discharge Goal: Diagnostic test results will improve Outcome: Adequate for Discharge Goal: Respiratory complications will improve Outcome: Adequate for Discharge Goal: Cardiovascular complication will be avoided Outcome: Adequate for Discharge   Problem: Activity: Goal: Risk for activity intolerance will decrease Outcome: Adequate for Discharge   Problem: Nutrition: Goal: Adequate nutrition will be maintained Outcome: Adequate for Discharge   Problem: Coping: Goal: Level of anxiety will decrease Outcome: Adequate for Discharge   Problem: Elimination: Goal: Will not experience complications related to bowel motility Outcome: Adequate for Discharge Goal: Will not experience complications related to urinary retention Outcome: Adequate for Discharge   Problem: Pain Managment: Goal: General experience of comfort will improve Outcome: Adequate for Discharge   Problem: Safety: Goal: Ability to remain free from injury will improve Outcome: Adequate for Discharge   Problem: Skin Integrity: Goal: Risk for impaired skin integrity will decrease Outcome: Adequate for Discharge

## 2020-05-26 NOTE — Plan of Care (Signed)
  Problem: Education: Goal: Knowledge of General Education information will improve Description Including pain rating scale, medication(s)/side effects and non-pharmacologic comfort measures Outcome: Progressing   Problem: Health Behavior/Discharge Planning: Goal: Ability to manage health-related needs will improve Outcome: Progressing   

## 2020-05-26 NOTE — Consult Note (Signed)
done

## 2020-05-26 NOTE — Discharge Instructions (Signed)
1)Hold Plavix until 06/01/2020, restart if no further dark stools/bleeding 2)Take Dexilant/Dexlansoprazole-30 mg every day with breakfast 3)Avoid ibuprofen/Advil/Aleve/Motrin/Goody Powders/Naproxen/BC powders/Meloxicam/Diclofenac/Indomethacin and other Nonsteroidal anti-inflammatory medications as these will make you more likely to bleed and can cause stomach ulcers, can also cause Kidney problems.  4) repeat CBC BMP blood test with primary care physician within a week 5) follow-up with your Gastroenterologist Dr. Laural Golden as previously advised

## 2020-05-26 NOTE — ED Notes (Signed)
Patient transported to CT 

## 2020-05-28 ENCOUNTER — Telehealth (INDEPENDENT_AMBULATORY_CARE_PROVIDER_SITE_OTHER): Payer: Self-pay

## 2020-05-28 DIAGNOSIS — K831 Obstruction of bile duct: Secondary | ICD-10-CM

## 2020-05-28 NOTE — Telephone Encounter (Signed)
LeighAnn Ivett Luebbe, CMA  

## 2020-05-30 ENCOUNTER — Telehealth (INDEPENDENT_AMBULATORY_CARE_PROVIDER_SITE_OTHER): Payer: Self-pay | Admitting: *Deleted

## 2020-05-30 ENCOUNTER — Other Ambulatory Visit (INDEPENDENT_AMBULATORY_CARE_PROVIDER_SITE_OTHER): Payer: Self-pay | Admitting: *Deleted

## 2020-05-30 DIAGNOSIS — D649 Anemia, unspecified: Secondary | ICD-10-CM

## 2020-05-30 NOTE — Telephone Encounter (Signed)
Talked with Dr.Rehman. He says that the patient should have a repeat H&H on Tuesday 06/05/20. She should also bring the hemoccult card to our office so that we may check it for blood.  He advises that if the patient becomes light headed , shortness of breather and or pass bright blood from the rectum or have tarry stools to report to the ED.  Patient called and made aware.

## 2020-05-30 NOTE — Progress Notes (Signed)
I talked with the patient and she is requesting that her lab order be sent to [facility] in [place]. She will go on 06/05/2020. Fax # [phone]. As for the hemoccult cards she does not recall how to collect t hem and she states that she will not be able to do.  She was also made aware that if she has light headedness , Shortness of Breathe , Bright Red Blood and or Tarry Stool she is to go the ED.

## 2020-05-30 NOTE — Telephone Encounter (Signed)
Patient has called the office x 3. She had lab work drawn yesterday at [facility] office she states that the lab work showed her HGB had gone down to 9.6 and that this has dropped since 05/23/2020, it was 13.3 at that time. She reports that her stools are not black or tarry.  She is asking if she should go to the ED or what should she do.  A copy of her lab work has been rec'd and Dr.Rehman has been contacted. Patient will be called as he addresses the results.

## 2020-06-07 ENCOUNTER — Telehealth (INDEPENDENT_AMBULATORY_CARE_PROVIDER_SITE_OTHER): Payer: Self-pay | Admitting: Internal Medicine

## 2020-06-07 NOTE — Telephone Encounter (Signed)
CBC result from this morning reviewed Patient called but she is not home. Will try calling her this evening.

## 2020-06-08 ENCOUNTER — Telehealth (INDEPENDENT_AMBULATORY_CARE_PROVIDER_SITE_OTHER): Payer: Self-pay | Admitting: Internal Medicine

## 2020-06-08 DIAGNOSIS — K921 Melena: Secondary | ICD-10-CM

## 2020-06-08 NOTE — Telephone Encounter (Signed)
I was able to talk with patient last evening and reviewed results of her CBC. CBC was done on 06/06/2020 WBC 20.0 H&H 9.4 and 29.8 and platelet count 746K. Patient states her stools are black anymore.  He is not having any abdominal pain nausea or vomiting. She has chronic leukocytosis and thrombocytosis.  It is unclear whether is due to prednisone or rheumatoid arthritis. She is scheduled for MRCP next Monday. She is to bring Hemoccults to the office that day. Will also check CBC on 06/11/2020.

## 2020-06-11 ENCOUNTER — Ambulatory Visit (HOSPITAL_COMMUNITY)
Admission: RE | Admit: 2020-06-11 | Discharge: 2020-06-11 | Disposition: A | Discharge status: Home / Self Care | Payer: Medicare Other | Source: Ambulatory Visit | Attending: Internal Medicine | Admitting: Internal Medicine

## 2020-06-11 ENCOUNTER — Other Ambulatory Visit (INDEPENDENT_AMBULATORY_CARE_PROVIDER_SITE_OTHER): Payer: Self-pay | Admitting: *Deleted

## 2020-06-11 ENCOUNTER — Telehealth (INDEPENDENT_AMBULATORY_CARE_PROVIDER_SITE_OTHER): Payer: Self-pay | Admitting: *Deleted

## 2020-06-11 ENCOUNTER — Other Ambulatory Visit: Payer: Self-pay

## 2020-06-11 ENCOUNTER — Other Ambulatory Visit (INDEPENDENT_AMBULATORY_CARE_PROVIDER_SITE_OTHER): Payer: Self-pay | Admitting: Internal Medicine

## 2020-06-11 DIAGNOSIS — K921 Melena: Secondary | ICD-10-CM

## 2020-06-11 DIAGNOSIS — K831 Obstruction of bile duct: Secondary | ICD-10-CM

## 2020-06-11 LAB — CBC
HCT: 30.8 % — ABNORMAL LOW (ref 35.0–45.0)
Hemoglobin: 9.8 g/dL — ABNORMAL LOW (ref 11.7–15.5)
MCH: 26.1 pg — ABNORMAL LOW (ref 27.0–33.0)
MCHC: 31.8 g/dL — ABNORMAL LOW (ref 32.0–36.0)
MCV: 81.9 fL (ref 80.0–100.0)
MPV: 8.9 fL (ref 7.5–12.5)
Platelets: 540 10*3/uL — ABNORMAL HIGH (ref 140–400)
RBC: 3.76 10*6/uL — ABNORMAL LOW (ref 3.80–5.10)
RDW: 13.8 % (ref 11.0–15.0)
WBC: 14.4 10*3/uL — ABNORMAL HIGH (ref 3.8–10.8)

## 2020-06-11 MED ORDER — GADOBUTROL 1 MMOL/ML IV SOLN
7.0000 mL | Freq: Once | INTRAVENOUS | Status: AC | PRN
  Administered 2020-06-11: 7 mL via INTRAVENOUS

## 2020-06-11 NOTE — Telephone Encounter (Signed)
   Diagnosis:    Result(s)   Card 1: Positive:     Card 2:Negative:      Completed by: Thomas Hoff, LPN   HEMOCCULT SENSA DEVELOPER: WGN#:[id number] Olivia [name] DATE: 2022-06   HEMOCCULT SENSA CARD:  LOT#:  [id number] 1R EXPIRATION QION:62/95    CARD CONTROL RESULTS:  POSITIVE: Positive NEGATIVE: Negative    ADDITIONAL COMMENTS: Patient was given the results of the hemoccults. She was going next door to have CBC drawn. Patient advised that Dr.Rehman would be made aware.

## 2020-06-18 ENCOUNTER — Other Ambulatory Visit: Payer: Self-pay

## 2020-06-18 ENCOUNTER — Telehealth: Payer: Self-pay

## 2020-06-18 DIAGNOSIS — K831 Obstruction of bile duct: Secondary | ICD-10-CM

## 2020-06-18 DIAGNOSIS — R7989 Other specified abnormal findings of blood chemistry: Secondary | ICD-10-CM

## 2020-06-18 DIAGNOSIS — K8050 Calculus of bile duct without cholangitis or cholecystitis without obstruction: Secondary | ICD-10-CM

## 2020-06-18 NOTE — Telephone Encounter (Signed)
The pt has been scheduled for EUS ERCP on 07/26/20 at 830 am with Dr Ardis Hughs COVID test on 07/23/20 at 1015 am Need plavix hold

## 2020-06-18 NOTE — Telephone Encounter (Signed)
-----   Message from Irving Copas., MD sent at 06/14/2020  4:03 PM EST ----- Reasonable.  Can try to FNB the stricture if necessary to exclude extrahepatic cholangiocarcinoma +/- Cholangioscopy via Spy to rule out cholangiocarcinoma. GM ----- Message ----- From: Milus Banister, MD Sent: 06/14/2020   2:44 PM EST To: Rogene Houston, MD, Timothy Lasso, RN, #  Bernadene Person, I reviewed her workup so far, [place] and more locally.  I think EUS will probably be helpful as a next step.  I do wonder if she has impacted stone mimicking a stricture and so I'll plan to be prepared for an ERCP at the same time.  I doubt this will be before early to [facility] January given the current backlog and holiday's coming up and so if melena persists she may need EGD up there sooner.  FYI.  Cleon Signorelli, She needs EUS +/- ERCP for bile duct stones, stricture. First available with myself or Gabe (if he agrees).  Please change this note to a phone note for record keeping.  Thanks  Gabe, See above and below.    Thanks ALL.   ----- Message ----- From: Rogene Houston, MD Sent: 06/13/2020   1:42 PM EST To: Milus Banister, MD  [place], Please review MRCP on Olivia [name] when you get a chance. Patient was sent to me with a diagnosis of choledocholithiasis.  First CT showed pneumobilia but I could not find any evidence of prior biliary intervention. I would also like you to do an EGD if you agree to proceed with EUS since she developed melena last week which has cleared. Let me know. Thank you. [employer]

## 2020-06-18 NOTE — Telephone Encounter (Signed)
Left message on machine to call back  Need to know who prescribes plavix

## 2020-06-19 NOTE — Telephone Encounter (Signed)
Left message on machine to call back  

## 2020-06-20 NOTE — Telephone Encounter (Signed)
The pt states she is NOT currently taking plavix.  EUS scheduled, pt instructed and medications reviewed.  Patient instructions mailed to home.  Patient to call with any questions or concerns.

## 2020-06-20 NOTE — Telephone Encounter (Signed)
Patient returned your call.

## 2020-06-26 NOTE — Telephone Encounter (Signed)
Talk with patient.  She is doing fine.  No more melena.   Patient's EUS and ERCP would not be until July 26, 2020. Patient also needs EGD. Since she is asymptomatic and not having tarry stools anymore we will wait until then. However will check H&H next week.  Tammy, please mail patient paperwork for H&H that she can have at [facility] locally next week

## 2020-06-27 ENCOUNTER — Other Ambulatory Visit (INDEPENDENT_AMBULATORY_CARE_PROVIDER_SITE_OTHER): Payer: Self-pay | Admitting: *Deleted

## 2020-06-27 DIAGNOSIS — K625 Hemorrhage of anus and rectum: Secondary | ICD-10-CM

## 2020-06-27 DIAGNOSIS — D649 Anemia, unspecified: Secondary | ICD-10-CM

## 2020-06-27 DIAGNOSIS — K921 Melena: Secondary | ICD-10-CM

## 2020-06-27 NOTE — Telephone Encounter (Signed)
Good Morning Dr Laural Golden, will you be doing Olivia [name] EGD? Please advise?

## 2020-06-27 NOTE — Telephone Encounter (Signed)
She will have EGD at the time of endoscopic ultrasound and ERCP by Dr. Wendie Agreste. [place]

## 2020-06-27 NOTE — Telephone Encounter (Signed)
Lab Order has been mailed to the patient . Per Dr. Laural Golden the patient's EUS , ERCP is not until January 20 ,2022. She will also need EGD.

## 2020-06-28 NOTE — Telephone Encounter (Signed)
Forwarding to Patty

## 2020-07-02 NOTE — Telephone Encounter (Signed)
The pt has been scheduled and notified.  Thank you

## 2020-07-12 NOTE — Progress Notes (Signed)
Pt informed to get covid test Jan 17 prior to procedure on Jan 20. Pt voiced understanding. Instructions given to report to [facility] covid testing site.

## 2020-07-14 LAB — HEMOGLOBIN AND HEMATOCRIT, BLOOD
Hematocrit: 35.3 % (ref 34.0–46.6)
Hemoglobin: 10.9 g/dL — ABNORMAL LOW (ref 11.1–15.9)

## 2020-07-23 ENCOUNTER — Other Ambulatory Visit (HOSPITAL_COMMUNITY): Payer: Medicare Other

## 2020-07-24 ENCOUNTER — Other Ambulatory Visit (HOSPITAL_COMMUNITY)
Admission: RE | Admit: 2020-07-24 | Discharge: 2020-07-24 | Disposition: A | Discharge status: Home / Self Care | Payer: Medicare Other | Source: Ambulatory Visit | Attending: Gastroenterology | Admitting: Gastroenterology

## 2020-07-24 DIAGNOSIS — Z20822 Contact with and (suspected) exposure to covid-19: Secondary | ICD-10-CM | POA: Insufficient documentation

## 2020-07-24 DIAGNOSIS — Z01818 Encounter for other preprocedural examination: Secondary | ICD-10-CM | POA: Insufficient documentation

## 2020-07-24 LAB — SARS CORONAVIRUS 2 (TAT 6-24 HRS): SARS Coronavirus 2: NEGATIVE

## 2020-07-26 ENCOUNTER — Ambulatory Visit (HOSPITAL_COMMUNITY): Payer: Medicare Other | Admitting: Anesthesiology

## 2020-07-26 ENCOUNTER — Telehealth: Payer: Self-pay

## 2020-07-26 ENCOUNTER — Ambulatory Visit (HOSPITAL_COMMUNITY)
Admission: RE | Admit: 2020-07-26 | Discharge: 2020-07-26 | Disposition: A | Discharge status: Home / Self Care | Payer: Medicare Other | Attending: Gastroenterology | Admitting: Gastroenterology

## 2020-07-26 ENCOUNTER — Encounter (HOSPITAL_COMMUNITY): Payer: Self-pay | Admitting: Gastroenterology

## 2020-07-26 ENCOUNTER — Ambulatory Visit (HOSPITAL_COMMUNITY): Payer: Medicare Other

## 2020-07-26 ENCOUNTER — Encounter (HOSPITAL_COMMUNITY)
Admission: RE | Disposition: A | Discharge status: Home / Self Care | Payer: Self-pay | Source: Home / Self Care | Attending: Gastroenterology

## 2020-07-26 DIAGNOSIS — R7989 Other specified abnormal findings of blood chemistry: Secondary | ICD-10-CM

## 2020-07-26 DIAGNOSIS — K831 Obstruction of bile duct: Secondary | ICD-10-CM

## 2020-07-26 DIAGNOSIS — Z801 Family history of malignant neoplasm of trachea, bronchus and lung: Secondary | ICD-10-CM | POA: Diagnosis not present

## 2020-07-26 DIAGNOSIS — Z87891 Personal history of nicotine dependence: Secondary | ICD-10-CM | POA: Diagnosis not present

## 2020-07-26 DIAGNOSIS — Z800 Family history of malignant neoplasm of digestive organs: Secondary | ICD-10-CM | POA: Diagnosis not present

## 2020-07-26 DIAGNOSIS — Z885 Allergy status to narcotic agent status: Secondary | ICD-10-CM | POA: Diagnosis not present

## 2020-07-26 DIAGNOSIS — Z9049 Acquired absence of other specified parts of digestive tract: Secondary | ICD-10-CM | POA: Insufficient documentation

## 2020-07-26 DIAGNOSIS — K8050 Calculus of bile duct without cholangitis or cholecystitis without obstruction: Secondary | ICD-10-CM

## 2020-07-26 DIAGNOSIS — K5710 Diverticulosis of small intestine without perforation or abscess without bleeding: Secondary | ICD-10-CM | POA: Insufficient documentation

## 2020-07-26 DIAGNOSIS — Z803 Family history of malignant neoplasm of breast: Secondary | ICD-10-CM | POA: Diagnosis not present

## 2020-07-26 DIAGNOSIS — I739 Peripheral vascular disease, unspecified: Secondary | ICD-10-CM | POA: Diagnosis not present

## 2020-07-26 DIAGNOSIS — M069 Rheumatoid arthritis, unspecified: Secondary | ICD-10-CM | POA: Insufficient documentation

## 2020-07-26 HISTORY — PX: ENDOSCOPIC RETROGRADE CHOLANGIOPANCREATOGRAPHY (ERCP) WITH PROPOFOL: SHX5810

## 2020-07-26 HISTORY — PX: BILIARY BRUSHING: SHX6843

## 2020-07-26 HISTORY — PX: ESOPHAGOGASTRODUODENOSCOPY (EGD) WITH PROPOFOL: SHX5813

## 2020-07-26 HISTORY — PX: SPYGLASS CHOLANGIOSCOPY: SHX5441

## 2020-07-26 HISTORY — PX: STENT REMOVAL: SHX6421

## 2020-07-26 HISTORY — PX: UPPER ESOPHAGEAL ENDOSCOPIC ULTRASOUND (EUS): SHX6562

## 2020-07-26 SURGERY — UPPER ESOPHAGEAL ENDOSCOPIC ULTRASOUND (EUS)
Anesthesia: General

## 2020-07-26 MED ORDER — DEXAMETHASONE SODIUM PHOSPHATE 10 MG/ML IJ SOLN
INTRAMUSCULAR | Status: DC | PRN
Start: 2020-07-26 — End: 2020-07-26
  Administered 2020-07-26: 5 mg via INTRAVENOUS

## 2020-07-26 MED ORDER — GLUCAGON HCL RDNA (DIAGNOSTIC) 1 MG IJ SOLR
INTRAMUSCULAR | Status: AC
  Filled 2020-07-26: qty 1

## 2020-07-26 MED ORDER — CIPROFLOXACIN IN D5W 400 MG/200ML IV SOLN
INTRAVENOUS | Status: DC | PRN
  Administered 2020-07-26: 400 mg via INTRAVENOUS

## 2020-07-26 MED ORDER — LACTATED RINGERS IV SOLN: INTRAVENOUS | Status: DC

## 2020-07-26 MED ORDER — GLYCOPYRROLATE 0.2 MG/ML IJ SOLN
INTRAMUSCULAR | Status: DC | PRN
  Administered 2020-07-26: .1 mg via INTRAVENOUS

## 2020-07-26 MED ORDER — PROPOFOL 500 MG/50ML IV EMUL
INTRAVENOUS | Status: AC
  Filled 2020-07-26: qty 50

## 2020-07-26 MED ORDER — SODIUM CHLORIDE 0.9 % IV SOLN: INTRAVENOUS | Status: DC

## 2020-07-26 MED ORDER — ROCURONIUM BROMIDE 100 MG/10ML IV SOLN
INTRAVENOUS | Status: DC | PRN
  Administered 2020-07-26: 50 mg via INTRAVENOUS

## 2020-07-26 MED ORDER — FENTANYL CITRATE (PF) 100 MCG/2ML IJ SOLN
INTRAMUSCULAR | Status: AC
  Filled 2020-07-26: qty 2

## 2020-07-26 MED ORDER — PROPOFOL 10 MG/ML IV BOLUS
INTRAVENOUS | Status: AC
  Filled 2020-07-26: qty 20

## 2020-07-26 MED ORDER — SODIUM CHLORIDE 0.9 % IV SOLN
INTRAVENOUS | Status: DC | PRN
  Administered 2020-07-26: 35 mL

## 2020-07-26 MED ORDER — SUGAMMADEX SODIUM 200 MG/2ML IV SOLN
INTRAVENOUS | Status: DC | PRN
  Administered 2020-07-26 (×2): 100 mg via INTRAVENOUS

## 2020-07-26 MED ORDER — MIDAZOLAM HCL 2 MG/2ML IJ SOLN
INTRAMUSCULAR | Status: AC
  Filled 2020-07-26: qty 2

## 2020-07-26 MED ORDER — CIPROFLOXACIN IN D5W 400 MG/200ML IV SOLN
INTRAVENOUS | Status: AC
  Filled 2020-07-26: qty 200

## 2020-07-26 MED ORDER — INDOMETHACIN 50 MG RE SUPP
RECTAL | Status: DC | PRN
  Administered 2020-07-26: 100 mg via RECTAL

## 2020-07-26 MED ORDER — MIDAZOLAM HCL 5 MG/5ML IJ SOLN
INTRAMUSCULAR | Status: DC | PRN
  Administered 2020-07-26 (×2): 1 mg via INTRAVENOUS

## 2020-07-26 MED ORDER — INDOMETHACIN 50 MG RE SUPP
RECTAL | Status: AC
  Filled 2020-07-26: qty 2

## 2020-07-26 MED ORDER — PROPOFOL 10 MG/ML IV BOLUS
INTRAVENOUS | Status: DC | PRN
  Administered 2020-07-26: 200 mg via INTRAVENOUS

## 2020-07-26 MED ORDER — FENTANYL CITRATE (PF) 100 MCG/2ML IJ SOLN
INTRAMUSCULAR | Status: DC | PRN
  Administered 2020-07-26: 25 ug via INTRAVENOUS
  Administered 2020-07-26: 50 ug via INTRAVENOUS
  Administered 2020-07-26: 25 ug via INTRAVENOUS

## 2020-07-26 MED ORDER — ONDANSETRON HCL 4 MG/2ML IJ SOLN
INTRAMUSCULAR | Status: DC | PRN
  Administered 2020-07-26: 4 mg via INTRAVENOUS

## 2020-07-26 MED ORDER — LIDOCAINE HCL (CARDIAC) PF 100 MG/5ML IV SOSY
PREFILLED_SYRINGE | INTRAVENOUS | Status: DC | PRN
  Administered 2020-07-26: 50 mg via INTRAVENOUS

## 2020-07-26 MED ORDER — PHENYLEPHRINE HCL (PRESSORS) 10 MG/ML IV SOLN
INTRAVENOUS | Status: DC | PRN
  Administered 2020-07-26: 40 ug via INTRAVENOUS
  Administered 2020-07-26: 60 ug via INTRAVENOUS
  Administered 2020-07-26: 80 ug via INTRAVENOUS
  Administered 2020-07-26: 60 ug via INTRAVENOUS

## 2020-07-26 NOTE — Op Note (Addendum)
[facility] Patient Name: Olivia [name] Procedure Date: 07/26/2020 MRN: [id number] Attending MD: Milus Banister , MD Date of Birth: 05/15/1956 CSN: [id number] Age: 65 Admit Type: Inpatient Procedure:                ERCP Indications:              Elevated liver enzymesComplicated biliary disease;                            Biliary disease dates back remotely to around 2005                            when she had cholecystectomy at same time as small                            bowel resection for peritonitis, very few details                            available from this event, eventual ileostomy                            reversal. Developed pain, elevated liver tests                            03/2019 in [place]. Imaging suggested prominent                            biliary tree. No intervention. 12/2019 [place]:                            dilated bile duct with suspected filling defects.                            01/2020 ERCP RGI: CBD stone and distal CBD stricture                            noted, s/p sphincterotomy, sludge removal, plastic                            stent placed. 05/2020 ERCP RGI: stent removed, CBD                            stricture brushed (no atypical cells), stent                            replaced.06/2020 MRCP: CBD dilation, pneumobilia                            (unable to comment on stones); no masses in                            pancreas or biliary tree noted. EUS just prior to  this procedure, see full note. Providers:                Milus Banister, MD, Cleda Daub, RN, Benetta Spar, Technician, Karis Juba, CRNA Referring MD:              Medicines:                General Anesthesia, Cipro [id number] mg IV Complications:            No immediate complications. Estimated blood loss:                            None Estimated Blood Loss:     Estimated blood loss:  none. Procedure:                Pre-Anesthesia Assessment:                           - Prior to the procedure, a History and Physical                            was performed, and patient medications and                            allergies were reviewed. The patient's tolerance of                            previous anesthesia was also reviewed. The risks                            and benefits of the procedure and the sedation                            options and risks were discussed with the patient.                            All questions were answered, and informed consent                            was obtained. Prior Anticoagulants: The patient has                            taken no previous anticoagulant or antiplatelet                            agents. ASA Grade Assessment: II - A patient with                            mild systemic disease. After reviewing the risks                            and benefits, the patient was deemed in  satisfactory condition to undergo the procedure.                           After obtaining informed consent, the scope was                            passed under direct vision. Throughout the                            procedure, the patient's blood pressure, pulse, and                            oxygen saturations were monitored continuously. The                            ERCP [id number] was introduced through the mouth, and                            used to inject contrast into and used to inject                            contrast into the bile duct. The ERCP was                            accomplished without difficulty. The patient                            tolerated the procedure well. Scope In: Scope Out: Findings:      The duodenoscope was advanced to the region of the major papilla. The       previous biliary stent was just removed during EUS portion of todays       examination. I used a 45 Autotome over the  .035 hydrawire to cannulate       the bile duct and then injected contrast. The CBD/CHD were diffusely       dilated (CHD 55mm) with a smooth, rounded stenosis at the distal CBD       which seemed to me to be due to the the immediately adjacent       periampullary duodenal diverticulum. The cytic duct stump had a high       takeoff and partially filled with contrast. I used a 12-66mm biliary       retrieval balloon to sweep the bile duct several times, starting at the       biliary bifurcation. A small amount of sludge was delivered into the       duodenum. The balloon did NOT signficantly hold up at the site of the       smooth distal CBD stenosis. I then used a biliary cytology brush to       sample the smooth distal CBD stenosis. Finally I introduced SpyGlass       direct cholangioscopy catheter to visually inspect the region of the       smooth distal CBD stenosis and although the imaging was not ideal there       were no stones and no evidence of biliary neoplasm. Contract       persistently emptied easily into  the duodenum. The main pancreatic duct       was never cannulated with a wire or injected with contrast. Impression:               - Sludge, debris (?small amount of purulence) was                            delivered into the duodenum after the previous                            biliary stent was removed.                           - The smooth, rounded distal CBD stenosis seems                            most likely due to the adjacent periampullary                            diverticulum. I sampled the stenosis with biliary                            brush, sent the previously placed biliary stent for                            cytology (during EUS portion of today's                            examinaition) and by direct cholangioscopy using                            Spy Glass. Moderate Sedation:      Not Applicable - Patient had care per Anesthesia. Recommendation:            - Discharge patient to home.                           - My office will arrange repeat LFTs in 2 weeks. Procedure Code(s):        --- Professional ---                           [phone], Endoscopic retrograde                            cholangiopancreatography (ERCP); with removal of                            calculi/debris from biliary/pancreatic duct(s) Diagnosis Code(s):        --- Professional ---                           K80.50, Calculus of bile duct without cholangitis                            or cholecystitis without obstruction CPT copyright 2019 American Medical Association. All rights reserved. The  codes documented in this report are preliminary and upon coder review may  be revised to meet current compliance requirements. Milus Banister, MD 07/26/2020 10:42:49 AM This report has been signed electronically. Number of Addenda: 0

## 2020-07-26 NOTE — H&P (Signed)
HPI: This is a 65 yo woman referred by Dr. Laural Golden   Biliary disease dates back remotely to around 2005 when she had cholecystectomy at same time as small bowel resection for peritonitis, very few details available from this event, eventual ileostomy reversal.  Developed pain, elevated liver tests 03/2019 in [place].  Imaging suggested prominent biliary tree. No intervention.  12/2019 [place] MR: dilated bile duct with suspected filling defects. 01/2020 ERCP RGI: CBD stone and distal CBD stricture noted, s/p sphincterotomy, sludge removal, plastic stent placed. 05/2020 ERCP RGI: stent removed, CBD stricture brushed (no atypical cells), stent replaced.06/2020 MRCP: CBD dilation, pneumobilia (unable to comment on stones); no masses in pancreas or biliary tree noted.  Referred by Dr. Laural Golden to revaluate the distal CBD stricture with EUS, ERCP  ROS: complete GI ROS as described in HPI, all other review negative.  Constitutional:  No unintentional weight loss   Past Medical History:  Diagnosis Date  . Dyslipidemia   . PAD (peripheral artery disease) ([place])   . PONV (postoperative nausea and vomiting)   . Psoriasis   . RA (rheumatoid arthritis) ([place])   . Vertigo     Past Surgical History:  Procedure Laterality Date  . ANEURYSM COILING Right    right toe   . BILIARY BRUSHING  05/16/2020   Procedure: BILIARY BRUSHING;  Surgeon: Rogene Houston, MD;  Location: [facility];  Service: Endoscopy;;  . BILIARY STENT PLACEMENT  01/05/2020   Procedure: BILIARY STENT PLACEMENT;  Surgeon: Rogene Houston, MD;  Location: [facility];  Service: Endoscopy;;  . BILIARY STENT PLACEMENT  05/16/2020   Procedure: BILIARY STENT PLACEMENT;  Surgeon: Rogene Houston, MD;  Location: [facility];  Service: Endoscopy;;  . CHOLECYSTECTOMY    . COLONOSCOPY  03/07/2015   Propofol; Surgeon: Dr. Laveda Norman; Three 6 mm rectosigmoid polyps, moderate left-sided diverticulosis, tortuous rectosigmoid colon. Pathology with inflamed  hyperplastic polyp.  . COLONOSCOPY WITH PROPOFOL N/A 08/18/2019   non-bleeding internal hemorrhoids, pancolonic diverticulosis, two 4-7 mm polyps in rectum benign, no adenomas. Next colonoscopy in 10 years.   Marland Kitchen ERCP N/A 01/05/2020   Procedure: ENDOSCOPIC RETROGRADE CHOLANGIOPANCREATOGRAPHY (ERCP);  Surgeon: Rogene Houston, MD;  [facility]: [facility];  Service: Endoscopy;  Laterality: N/A;  . ERCP N/A 05/16/2020   Procedure: ENDOSCOPIC RETROGRADE CHOLANGIOPANCREATOGRAPHY (ERCP);  Surgeon: Rogene Houston, MD;  Location: [facility];  Service: Endoscopy;  Laterality: N/A;  . GASTROINTESTINAL STENT REMOVAL N/A 05/16/2020   Procedure: STENT REMOVAL;  Surgeon: Rogene Houston, MD;  Location: [facility];  Service: Endoscopy;  Laterality: N/A;  . HYSTERECTOMY ABDOMINAL WITH SALPINGO-OOPHORECTOMY    . ILEOSTOMY  2005   per patient for bowel obstruction and peritonitis with anastomosis  . INSERTION OF MESH     RLQ  . POLYPECTOMY  08/18/2019   Procedure: POLYPECTOMY;  Surgeon: Daneil Dolin, MD;  [facility];  Service: Endoscopy;;  . REMOVAL OF STONES N/A 01/05/2020   Procedure: REMOVAL OF STONES;  Surgeon: Rogene Houston, MD;  [facility];  Service: Endoscopy;  Laterality: N/A;  . SPHINCTEROTOMY N/A 01/05/2020   Procedure: SPHINCTEROTOMY;  Surgeon: Rogene Houston, MD;  [facility];  Service: Endoscopy;  Laterality: N/A;  . SPHINCTEROTOMY N/A 05/16/2020   Procedure: SPHINCTEROTOMY;  Surgeon: Rogene Houston, MD;  Location: [facility];  Service: Endoscopy;  Laterality: N/A;    Current Facility-Administered Medications  Medication Dose Route Frequency Provider Last Rate Last Admin  . 0.9 %  sodium chloride infusion   Intravenous Continuous Milus Banister, MD        Allergies as of 06/18/2020 - Review Complete 05/26/2020  Allergen Reaction Noted  . Codeine  05/02/2019    Family History  Problem Relation Age of Onset  . Lung cancer Mother   . Lung cancer Father    . Stomach cancer Brother   . Breast cancer Cousin   . Colon cancer Neg Hx     Social History   Socioeconomic History  . Marital status: Married    Spouse name: Not on file  . Number of children: Not on file  . Years of education: Not on file  . Highest education level: Not on file  Occupational History  . Not on file  Tobacco Use  . Smoking status: Former Smoker    Packs/day: 0.50    Years: 50.00    Pack years: 25.00    Types: Cigarettes    Quit date: 08/14/2018    Years since quitting: 1.9  . Smokeless tobacco: Never Used  Vaping Use  . Vaping Use: Never used  Substance and Sexual Activity  . Alcohol use: Not Currently  . Drug use: Never  . Sexual activity: Yes  Other Topics Concern  . Not on file  Social History Narrative  . Not on file   Social Determinants of Health   Financial Resource Strain: Not on file  Food Insecurity: Not on file  Transportation Needs: Not on file  Physical Activity: Not on file  Stress: Not on file  Social Connections: Not on file  Intimate Partner Violence: Not on file     Physical Exam: Ht 5\' 3"  (1.6 m)   Wt 72.6 kg   BMI 28.34 kg/m  Constitutional: generally well-appearing Psychiatric: alert and oriented x3 Abdomen: soft, nontender, nondistended, no obvious ascites, no peritoneal signs, normal bowel sounds No peripheral edema noted in lower extremities  Assessment and plan: 65 y.o. female with complicated biliary disease  For EUS, ERCP +/- spyglass cholangioscopy today  Please see the "Patient Instructions" section for addition details about the plan.  Owens Loffler, MD [facility] 07/26/2020, 7:24 AM

## 2020-07-26 NOTE — Anesthesia Procedure Notes (Signed)
Procedure Name: Intubation Date/Time: 07/26/2020 9:08 AM Performed by: Garrel Ridgel, CRNA Pre-anesthesia Checklist: Patient identified, Emergency Drugs available, Suction available and Patient being monitored Patient Re-evaluated:Patient Re-evaluated prior to induction Oxygen Delivery Method: Circle system utilized Preoxygenation: Pre-oxygenation with 100% oxygen Induction Type: IV induction Ventilation: Mask ventilation without difficulty Laryngoscope Size: Mac and 3 Grade View: Grade II Tube type: Oral Tube size: 7.0 mm Number of attempts: 1 Airway Equipment and Method: Stylet and Oral airway Placement Confirmation: ETT inserted through vocal cords under direct vision,  positive ETCO2 and breath sounds checked- equal and bilateral Secured at: 22 cm Tube secured with: Tape Dental Injury: Teeth and Oropharynx as per pre-operative assessment

## 2020-07-26 NOTE — Anesthesia Postprocedure Evaluation (Signed)
Anesthesia Post Note  Patient: Marika Mahaffy  Procedure(s) Performed: UPPER ESOPHAGEAL ENDOSCOPIC ULTRASOUND (EUS) (N/A ) ENDOSCOPIC RETROGRADE CHOLANGIOPANCREATOGRAPHY (ERCP) WITH PROPOFOL (N/A ) STENT REMOVAL BILIARY BRUSHING SPYGLASS CHOLANGIOSCOPY (N/A )     Patient location during evaluation: PACU Anesthesia Type: General Level of consciousness: awake and alert Pain management: pain level controlled Vital Signs Assessment: post-procedure vital signs reviewed and stable Respiratory status: spontaneous breathing, nonlabored ventilation, respiratory function stable and patient connected to nasal cannula oxygen Cardiovascular status: blood pressure returned to baseline and stable Postop Assessment: no apparent nausea or vomiting Anesthetic complications: no   No complications documented.  Last Vitals:  Vitals:   07/26/20 1053 07/26/20 1114  BP: 115/63 (!) 132/58  Pulse: 80 81  Resp: 15 (!) 28  Temp: 36.9 C   SpO2: 96% 94%    Last Pain:  Vitals:   07/26/20 1114  TempSrc:   PainSc: 0-No pain                 Effie Berkshire

## 2020-07-26 NOTE — Discharge Instructions (Signed)
General Anesthesia, Adult, Care After This sheet gives you information about how to care for yourself after your procedure. Your health care provider may also give you more specific instructions. If you have problems or questions, contact your health care provider. What can I expect after the procedure? After the procedure, the following side effects are common:  Pain or discomfort at the IV site.  Nausea.  Vomiting.  Sore throat.  Trouble concentrating.  Feeling cold or chills.  Feeling weak or tired.  Sleepiness and fatigue.  Soreness and body aches. These side effects can affect parts of the body that were not involved in surgery. Follow these instructions at home: For the time period you were told by your health care provider:  Rest.  Do not participate in activities where you could fall or become injured.  Do not drive or use machinery.  Do not drink alcohol.  Do not take sleeping pills or medicines that cause drowsiness.  Do not make important decisions or sign legal documents.  Do not take care of children on your own.   Eating and drinking  Follow any instructions from your health care provider about eating or drinking restrictions.  When you feel hungry, start by eating small amounts of foods that are soft and easy to digest (bland), such as toast. Gradually return to your regular diet.  Drink enough fluid to keep your urine pale yellow.  If you vomit, rehydrate by drinking water, juice, or clear broth. General instructions  If you have sleep apnea, surgery and certain medicines can increase your risk for breathing problems. Follow instructions from your health care provider about wearing your sleep device: ? Anytime you are sleeping, including during daytime naps. ? While taking prescription pain medicines, sleeping medicines, or medicines that make you drowsy.  Have a responsible adult stay with you for the time you are told. It is important to have  someone help care for you until you are awake and alert.  Return to your normal activities as told by your health care provider. Ask your health care provider what activities are safe for you.  Take over-the-counter and prescription medicines only as told by your health care provider.  If you smoke, do not smoke without supervision.  Keep all follow-up visits as told by your health care provider. This is important. Contact a health care provider if:  You have nausea or vomiting that does not get better with medicine.  You cannot eat or drink without vomiting.  You have pain that does not get better with medicine.  You are unable to pass urine.  You develop a skin rash.  You have a fever.  You have redness around your IV site that gets worse. Get help right away if:  You have difficulty breathing.  You have chest pain.  You have blood in your urine or stool, or you vomit blood. Summary  After the procedure, it is common to have a sore throat or nausea. It is also common to feel tired.  Have a responsible adult stay with you for the time you are told. It is important to have someone help care for you until you are awake and alert.  When you feel hungry, start by eating small amounts of foods that are soft and easy to digest (bland), such as toast. Gradually return to your regular diet.  Drink enough fluid to keep your urine pale yellow.  Return to your normal activities as told by your health care provider.   Ask your health care provider what activities are safe for you. This information is not intended to replace advice given to you by your health care provider. Make sure you discuss any questions you have with your health care provider. Document Revised: 03/08/2020 Document Reviewed: 10/06/2019 Elsevier Patient Education  2021 [employer]. Endoscopic Retrograde Cholangiopancreatogram, Care After This sheet gives you information about how to care for yourself after your  procedure. Your health care provider may also give you more specific instructions. If you have problems or questions, contact your health care provider. What can I expect after the procedure? After the procedure, it is common to have:  Soreness in your throat.  Nausea.  Bloating.  Dizziness.  Tiredness (fatigue). Follow these instructions at home:  Take over-the-counter and prescription medicines only as told by your health care provider.  If you were prescribed an antibiotic medicine, take it as told by your health care provider. Do not stop using the antibiotic even if you start to feel better.  If you were given a sedative during the procedure, it can affect you for several hours. Do not drive or operate machinery until your health care provider says that it is safe.  Have someone stay with you for 24 hours after the procedure.  Return to your normal activities as told by your health care provider. Ask your health care provider what activities are safe for you.  Return to eating what you normally do as soon as you feel well enough or as told by your health care provider.  Keep all follow-up visits as told by your health care provider. This is important.   Contact a health care provider if you:  Have pain in your abdomen that does not get better with medicine.  Develop signs of infection, such as: ? Chills or fever. ? Feeling unwell. Get help right away if you:  Have difficulty swallowing.  Have worsening pain in your throat, chest, or abdomen.  Vomit bright red blood or a substance that looks like coffee grounds.  Have bloody or black, tarry stools.  Have a fever.  Have a sudden increase in swelling (bloating) in your abdomen. Summary  After the procedure, it is common to feel tired, and to have some discomfort in your throat or some bloating of your abdomen.  If you were given a sedative during the procedure, it can affect you for several hours. Do not drive or  operate machinery until your health care provider says that it is safe. Have someone stay with you for 24 hours after the procedure.  Contact your health care provider if you have signs of infection, such as chills, fever, feeling unwell, or if you have pain that does not improve with medicine.  Get help right away if you have trouble swallowing, worsening pain, bloody or black vomit, bloody or black stools, a fever, or increased swelling in your abdomen. This information is not intended to replace advice given to you by your health care provider. Make sure you discuss any questions you have with your health care provider. Document Revised: 03/08/2019 Document Reviewed: 03/08/2019 Elsevier Patient Education  [place].

## 2020-07-26 NOTE — Op Note (Addendum)
[facility] Patient Name: Olivia [name] Procedure Date: 07/26/2020 MRN: [id number] Attending MD: Milus Banister , MD Date of Birth: 1956-05-11 CSN: [id number] Age: 65 Admit Type: Inpatient Procedure:                Upper EUS Indications:              Choledocholithiasis on MRCP, , Elevated liver                            enzymesComplicated biliary disease; Biliary disease                            dates back remotely to around 2005 when she had                            cholecystectomy at same time as small bowel                            resection for peritonitis, very few details                            available from this event, eventual ileostomy                            reversal. Developed pain, elevated liver tests                            03/2019 in [place]. Imaging suggested prominent                            biliary tree. No intervention. 12/2019 [place] MR:                            dilated bile duct with suspected filling defects.                            01/2020 ERCP RGI: CBD stone and distal CBD stricture                            noted, s/p sphincterotomy, sludge removal, plastic                            stent placed. 05/2020 ERCP RGI: stent removed, CBD                            stricture brushed (no atypical cells), stent                            replaced.06/2020 MRCP: CBD dilation, pneumobilia                            (unable to comment on stones); no masses in  pancreas or biliary tree noted Providers:                Milus Banister, MD, Cleda Daub, RN, Benetta Spar, Technician, Karis Juba, CRNA Referring MD:             Hildred Laser, MD Medicines:                General Anesthesia Complications:            No immediate complications. Estimated blood loss:                            None. Estimated Blood Loss:     Estimated blood loss: none. Procedure:                 Pre-Anesthesia Assessment:                           - Prior to the procedure, a History and Physical                            was performed, and patient medications and                            allergies were reviewed. The patient's tolerance of                            previous anesthesia was also reviewed. The risks                            and benefits of the procedure and the sedation                            options and risks were discussed with the patient.                            All questions were answered, and informed consent                            was obtained. Prior Anticoagulants: The patient has                            taken no previous anticoagulant or antiplatelet                            agents. ASA Grade Assessment: II - A patient with                            mild systemic disease. After reviewing the risks                            and benefits, the patient was deemed in  satisfactory condition to undergo the procedure.                           After obtaining informed consent, the endoscope was                            passed under direct vision. Throughout the                            procedure, the patient's blood pressure, pulse, and                            oxygen saturations were monitored continuously. The                            [id number]) Olympus Radial EUS was                            introduced through the mouth, and advanced to the                            second part of duodenum. The upper EUS was                            accomplished without difficulty. The patient                            tolerated the procedure well.                           Most of the images from this examination are                            included on the ERCP portion of today's                            examination. EUS images were taken however not                            saved. Scope  In: Scope Out: Findings:      ENDOSCOPIC FINDING (limited view with ERCP scope and radial EUS scope): :      The examined esophagus was endoscopically normal.      The entire examined stomach was endoscopically normal.      The previously placed plastic biliary stent extended about 2-3cm in to       the duodenum across the major papilla. There was partial occlusion of       the stent distal tip (biodebris). I removed the stent using a snare and       sent it for cytologic testing. Immediately following removal a large       amount of sludge, biodebris (?purulence) passed from the bile duct into       the duodenum. 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 The ampulla showed evidence of previous sphincterotomy.  ENDOSONOGRAPHIC FINDING: :      1. Air artifact from the periampullary duodenal diverticulum limited       views of the head of pancreas and distal bile duct greatly.      2. The CBD was dilated to 75mm without obvious filling defects.      3. No pancreatic masses or signs of chronic pancreatitis.      4. Limited views of the liver, spleen, portal and splenic vessels were       all normal Impression:               - The previously placed plastic biliary stent was                            removed with a snare, immediately afterwards                            copious sludge and biodebris passed into the                            duodenum.                           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                           - Air artifact from the periampullary duodenal                            diverticulum limited views of the head of pancreas                            and distal bile duct greatly however no obvious                            stones were noted in the mid/proximal bile duct                            (CBD 52mm) and no tumors or signs of chronic                             pancreatitis were noted in the pancreas. Moderate Sedation:      Not Applicable - Patient had care per Anesthesia. Recommendation:           - ERCP now. Procedure Code(s):        --- Professional ---                           [phone], Esophagogastroduodenoscopy, flexible,                            transoral; with endoscopic ultrasound examination  limited to the esophagus, stomach or duodenum, and                            adjacent structures Diagnosis Code(s):        --- Professional ---                           K80.50, Calculus of bile duct without cholangitis                            or cholecystitis without obstruction                           R74.8, Abnormal levels of other serum enzymes CPT copyright 2019 [employer] Association. All rights reserved. The codes documented in this report are preliminary and upon coder review may  be revised to meet current compliance requirements. Milus Banister, MD 07/26/2020 10:27:05 AM This report has been signed electronically. Number of Addenda: 0

## 2020-07-26 NOTE — Anesthesia Preprocedure Evaluation (Addendum)
Anesthesia Evaluation  Patient identified by MRN, date of birth, ID band Patient awake    Reviewed: Allergy & Precautions, NPO status , Patient's Chart, lab work & pertinent test results  History of Anesthesia Complications (+) PONV and history of anesthetic complications  Airway Mallampati: III  TM Distance: >3 FB Neck ROM: Full    Dental  (+) Teeth Intact, Dental Advisory Given   Pulmonary former smoker,    breath sounds clear to auscultation       Cardiovascular + Peripheral Vascular Disease   Rhythm:Regular Rate:Normal     Neuro/Psych negative neurological ROS  negative psych ROS   GI/Hepatic Neg liver ROS, GERD  ,  Endo/Other  negative endocrine ROS  Renal/GU negative Renal ROS     Musculoskeletal  (+) Arthritis , Rheumatoid disorders,    Abdominal Normal abdominal exam  (+)   Peds  Hematology   Anesthesia Other Findings   Reproductive/Obstetrics                            Anesthesia Physical Anesthesia Plan  ASA: II  Anesthesia Plan: General   Post-op Pain Management:    Induction: Intravenous  PONV Risk Score and Plan: 4 or greater and Ondansetron, Dexamethasone, Midazolam and Scopolamine patch - Pre-op  Airway Management Planned: Oral ETT  Additional Equipment: None  Intra-op Plan:   Post-operative Plan: Extubation in OR  Informed Consent: I have reviewed the patients History and Physical, chart, labs and discussed the procedure including the risks, benefits and alternatives for the proposed anesthesia with the patient or authorized representative who has indicated his/her understanding and acceptance.     Dental advisory given  Plan Discussed with: CRNA  Anesthesia Plan Comments:        Anesthesia Quick Evaluation

## 2020-07-26 NOTE — Telephone Encounter (Signed)
Lab order has been entered as ordered

## 2020-07-26 NOTE — Transfer of Care (Signed)
Immediate Anesthesia Transfer of Care Note  Patient: Olivia [name]  Procedure(s) Performed: UPPER ESOPHAGEAL ENDOSCOPIC ULTRASOUND (EUS) (N/A ) ENDOSCOPIC RETROGRADE CHOLANGIOPANCREATOGRAPHY (ERCP) WITH PROPOFOL (N/A ) STENT REMOVAL BILIARY BRUSHING SPYGLASS CHOLANGIOSCOPY (N/A )  Patient Location: PACU and Endoscopy Unit  Anesthesia Type:General  Level of Consciousness: awake, alert , oriented and patient cooperative  Airway & Oxygen Therapy: Patient Spontanous Breathing and Patient connected to face mask oxygen  Post-op Assessment: Report given to RN and Post -op Vital signs reviewed and stable  Post vital signs: Reviewed and stable  Last Vitals:  Vitals Value Taken Time  BP 138/56 07/26/20 1041  Temp    Pulse 81 07/26/20 1045  Resp 10 07/26/20 1045  SpO2 96 % 07/26/20 1045  Vitals shown include unvalidated device data.  Last Pain:  Vitals:   07/26/20 1041  TempSrc:   PainSc: 0-No pain         Complications: No complications documented.

## 2020-07-26 NOTE — Telephone Encounter (Signed)
-----   Message from Milus Banister, MD sent at 07/26/2020 10:44 AM EST ----- She needs LFTs in 2 weeks.  Thanks

## 2020-07-27 LAB — CYTOLOGY - NON PAP

## 2020-07-28 ENCOUNTER — Encounter (HOSPITAL_COMMUNITY): Payer: Self-pay | Admitting: Gastroenterology

## 2020-08-02 NOTE — Telephone Encounter (Signed)
Patient called request the order get sent to [facility] fax: [place]

## 2020-08-02 NOTE — Telephone Encounter (Signed)
Lab order faxed per pt request

## 2020-08-07 ENCOUNTER — Encounter (INDEPENDENT_AMBULATORY_CARE_PROVIDER_SITE_OTHER): Payer: Self-pay

## 2020-08-09 ENCOUNTER — Other Ambulatory Visit: Payer: Self-pay | Admitting: Gastroenterology

## 2020-08-09 NOTE — Telephone Encounter (Signed)
Inbound call from patient stating she is at [facility] but they do not have lab orders.

## 2020-08-09 NOTE — Telephone Encounter (Signed)
Lab order refaxed-- I spoke with the pt and made her aware.

## 2020-08-10 LAB — HEPATIC FUNCTION PANEL
ALT: 26 IU/L (ref 0–32)
AST: 15 IU/L (ref 0–40)
Albumin: 4.3 g/dL (ref 3.8–4.8)
Alkaline Phosphatase: 76 IU/L (ref 44–121)
Bilirubin Total: 0.3 mg/dL (ref 0.0–1.2)
Bilirubin, Direct: <0.1 mg/dL (ref 0.00–0.40)
Total Protein: 7 g/dL (ref 6.0–8.5)

## 2020-08-13 ENCOUNTER — Other Ambulatory Visit: Payer: Self-pay

## 2020-08-13 ENCOUNTER — Encounter (INDEPENDENT_AMBULATORY_CARE_PROVIDER_SITE_OTHER): Payer: Self-pay

## 2020-08-13 DIAGNOSIS — R7989 Other specified abnormal findings of blood chemistry: Secondary | ICD-10-CM

## 2020-08-27 ENCOUNTER — Telehealth: Payer: Self-pay | Admitting: Gastroenterology

## 2020-08-27 NOTE — Telephone Encounter (Signed)
Pt is requesting a call back from a nurse to discuss her elevated liver enzyme levels, her abdominal pain and a referral to [facility] for a surgery that she needs.

## 2020-08-27 NOTE — Telephone Encounter (Signed)
The pt states she had labs locally for her PCP and wants to make Dr Ardis Hughs aware. She will call and have the labs faxed to Dr Ardis Hughs attention. The pt has been advised of the information and verbalized understanding.   I provided her with the fax number [phone]

## 2020-08-28 ENCOUNTER — Telehealth: Payer: Self-pay | Admitting: Gastroenterology

## 2020-08-28 NOTE — Telephone Encounter (Signed)
Placed a call to the pt- no answer message left for her to return call.

## 2020-08-28 NOTE — Telephone Encounter (Signed)
Pt called asking for referral to Dr. Harley Hallmark at [facility]. Pt stated that Dr. Delrae Alfred performs the type of surgery that she needs. Pls fax records to [phone] and new pt contact line is [phone]. Pt also mentioned that her Live enzymes went up from 27 to 43. She stated that [facility] denied her request to fax a copy of labs to [place].

## 2020-08-29 ENCOUNTER — Telehealth (INDEPENDENT_AMBULATORY_CARE_PROVIDER_SITE_OTHER): Payer: Self-pay | Admitting: Internal Medicine

## 2020-08-29 ENCOUNTER — Telehealth: Payer: Self-pay | Admitting: Gastroenterology

## 2020-08-29 NOTE — Telephone Encounter (Signed)
Patient states she had her stent removed and had an ERCP done by Dr Owens Loffler with camera on 07/26/2020. She states she is still having pain in her bowel duct and has some elevated liver functions. No nausea, no vomiting, no fevers. Would like to be referred by [place] to a Dr. Harley Hallmark at [place], she states they have surgeons there too at that practice that Dr. Delrae Alfred is located at [place] she needed further surgeries. She says she is afraid that here bowel duct may close off and she will end up developing Pancreatitis.

## 2020-08-29 NOTE — Telephone Encounter (Signed)
Thank you Patty, I will call Ms. Mentor after I review her records

## 2020-08-29 NOTE — Telephone Encounter (Signed)
I just left a message on her machine.  Her liver tests here 3 weeks ago perfectly normal.  I can see now through reviewing epic a bit further that she had local labs drawn and that her liver numbers were higher but I still do not know exactly what they were.  Not sure if they were technically elevated yet.  I asked that she call back here to my office to discuss this further.  It may be easiest that she just have either the results from her probably [place] labs sent here directly or repeat labs locally so that we can compare them to local labs done here as well.  My suspicion based on previous EUS and ERCP was that her biliary narrowing was benign, possibly related to the very nearby periampullary duodenal diverticulum.

## 2020-08-29 NOTE — Telephone Encounter (Signed)
Dr Ardis Hughs the pt is calling for a referral to [facility] to see either Dr Delrae Alfred or Dr Crisoforo Oxford.  She was not sure if you would be making the referral but says you mentioned it at the procedure.  She is faxing recent labs to your attention today.  Please advise

## 2020-08-29 NOTE — Telephone Encounter (Signed)
The pt has been advised of Dr Ardis Hughs recommendations.  She will call Dr Olevia Perches office.

## 2020-08-29 NOTE — Telephone Encounter (Signed)
Patient left voice mail message stating she has had her surgery - states she is having pain & burning in her bile duct - states liver enzymes are elevated - please advise - ph# [phone]

## 2020-08-29 NOTE — Telephone Encounter (Signed)
She will need to discuss this with Dr. Laural Golden who referred her to me.  My plan was to follow her LFTs for a while before deciding if she needs surgery or any further procedures.  Olivia [name]

## 2020-08-30 ENCOUNTER — Other Ambulatory Visit (INDEPENDENT_AMBULATORY_CARE_PROVIDER_SITE_OTHER): Payer: Self-pay | Admitting: Internal Medicine

## 2020-08-30 MED ORDER — SUCRALFATE 1 G PO TABS: 1.0000 g | ORAL_TABLET | Freq: Three times a day (TID) | ORAL | 1 refills | Status: DC

## 2020-08-30 NOTE — Telephone Encounter (Signed)
I have made the referral to [facility] to Dr Delrae Alfred. All records have been faxed.

## 2020-08-30 NOTE — Telephone Encounter (Signed)
We were able to speak on the phone this morning.  She had her rheumatoligist recheck her LFTs in the past couple days and is concerned. She has intermittent burning sensation in her rRUQ  tbili 0.2 aphos 72 ast 24 Alt 33  I explained that her LFTS are still all in the normal range and that this new panel is not very alarming at all. I recommended repeat LFTs in 4 weeks to see how they trend.  I explained again that I think her biliary troubles  May have been from the periampullary diverticulum.  She understands and still wants a referral to [facility] Dr. Delrae Alfred.   Patty, Please refer her to Dr. Delrae Alfred at [facility], please send my recent procedures notes, office notes, lab tests.  Thanks  [place],  Juluis Rainier

## 2020-08-30 NOTE — Telephone Encounter (Signed)
Thank you Patty, I just finished talking with her. She is having burning pain in right upper quadrant but no biliary type of pain.  No fever or chills.  I have sent a prescription for sucralfate that she can try for few days and see if it helps.

## 2020-08-30 NOTE — Telephone Encounter (Signed)
Dr. Laural Golden has talked with the patient.

## 2020-09-06 ENCOUNTER — Telehealth: Payer: Self-pay | Admitting: Gastroenterology

## 2020-09-06 NOTE — Telephone Encounter (Signed)
Lab order faxed as requested.  The pt has been advised.

## 2020-09-06 NOTE — Telephone Encounter (Signed)
Pt is requesting a call back from a nurse to see if she can have her labs done in [place] Fax: [phone]

## 2020-09-07 ENCOUNTER — Other Ambulatory Visit: Payer: Self-pay | Admitting: Gastroenterology

## 2020-09-07 NOTE — Telephone Encounter (Signed)
Pt states [facility] has not received the orders again.

## 2020-09-07 NOTE — Telephone Encounter (Signed)
Re faxed lab order to [facility] in [place], [place] at [phone]. Patient has been notified.

## 2020-09-07 NOTE — Telephone Encounter (Signed)
Labs faxed again. Have confirmation from previous 2 faxes.

## 2020-09-10 LAB — HEPATIC FUNCTION PANEL
ALT: 19 IU/L (ref 0–32)
AST: 17 IU/L (ref 0–40)
Albumin: 3.9 g/dL (ref 3.8–4.8)
Alkaline Phosphatase: 64 IU/L (ref 44–121)
Bilirubin Total: <0.2 mg/dL (ref 0.0–1.2)
Bilirubin, Direct: <0.1 mg/dL (ref 0.00–0.40)
Total Protein: 6.4 g/dL (ref 6.0–8.5)

## 2020-09-11 ENCOUNTER — Telehealth: Payer: Self-pay | Admitting: Gastroenterology

## 2020-09-11 ENCOUNTER — Other Ambulatory Visit: Payer: Self-pay

## 2020-09-11 DIAGNOSIS — R7989 Other specified abnormal findings of blood chemistry: Secondary | ICD-10-CM

## 2020-09-11 NOTE — Telephone Encounter (Signed)
See results note 3/8

## 2020-09-11 NOTE — Telephone Encounter (Signed)
Pt is requesting a call back from a nurse regarding her lab results. 

## 2021-12-10 IMAGING — RF DG ERCP WO/W SPHINCTEROTOMY
1 series · 15 of 24 positions shown · non-contrast
Comparison: Prior ERCP on 01/05/2020

CLINICAL DATA: Common bile duct stricture and previous placement of
endoscopic common bile duct stent.

EXAM:
ERCP
TECHNIQUE: Multiple spot images obtained with the fluoroscopic device and
submitted for interpretation post-procedure.

[Series 1: run · 15 acquisitions, 15 frames shown]
[im 1/15]
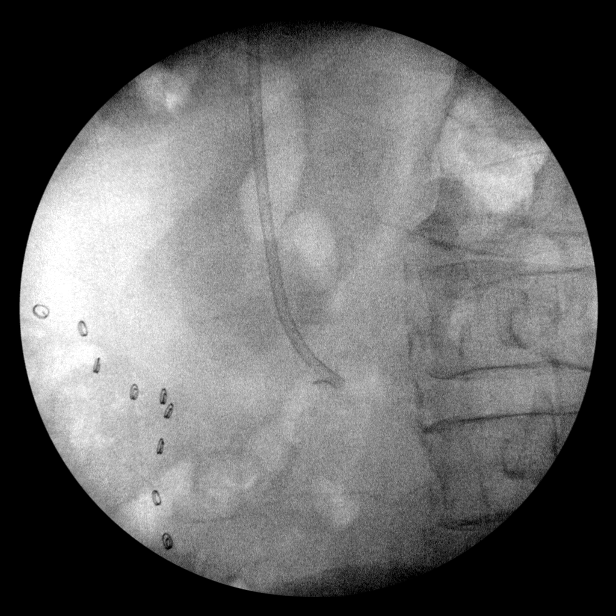
[im 3/15]
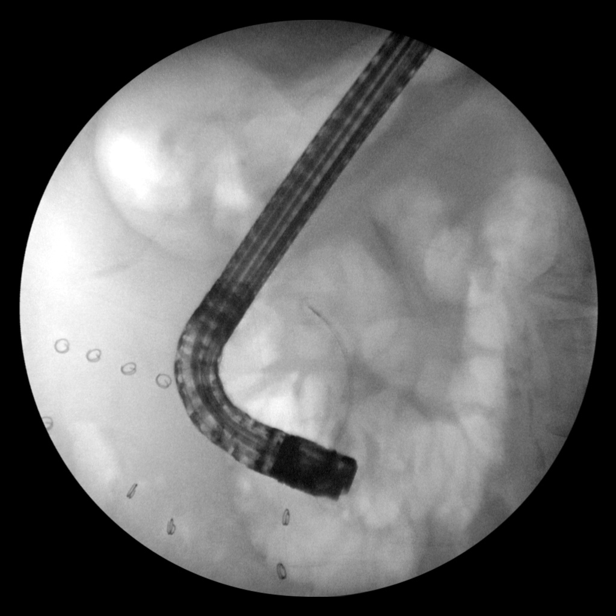
[im 3/15]
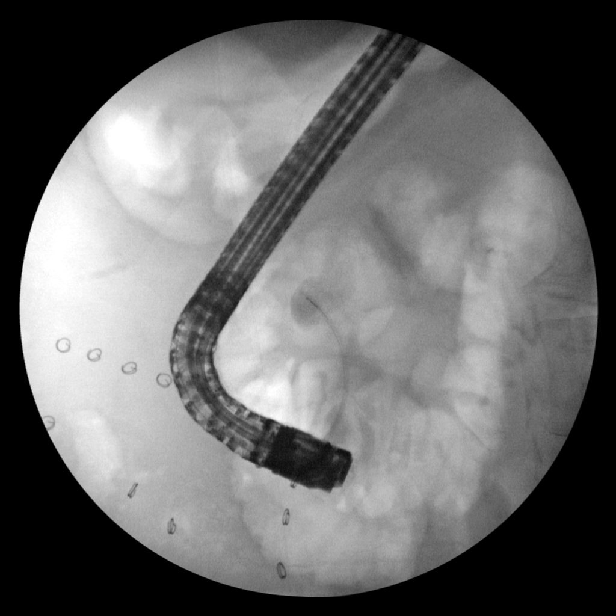
[im 4/15]
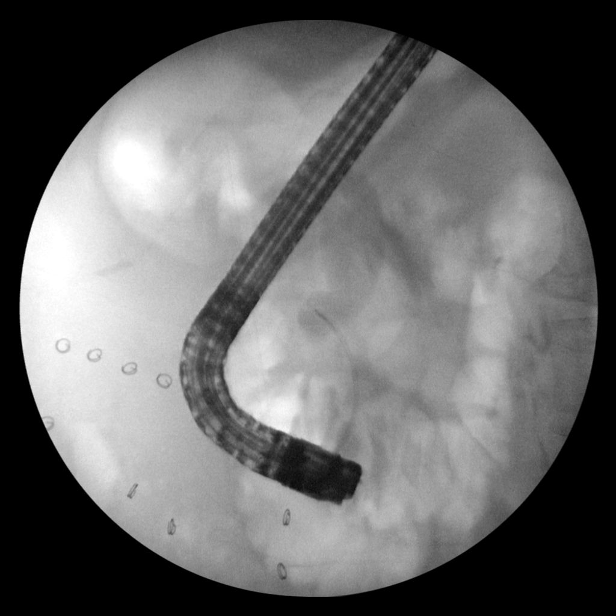
[im 4/15]
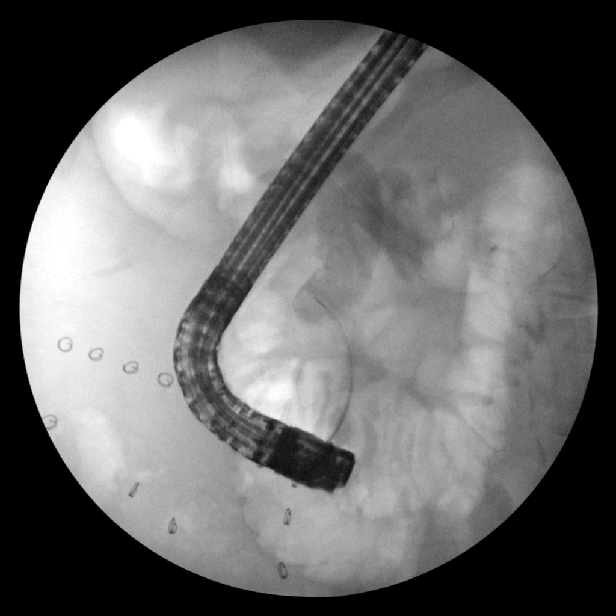
[im 5/15]
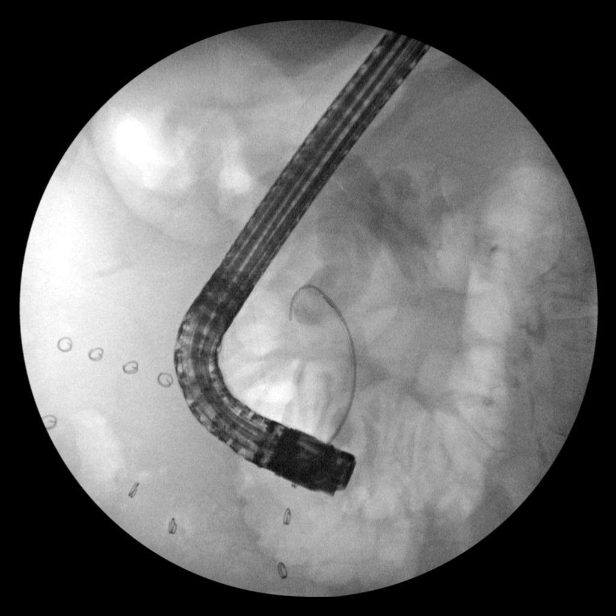
[im 6/15]
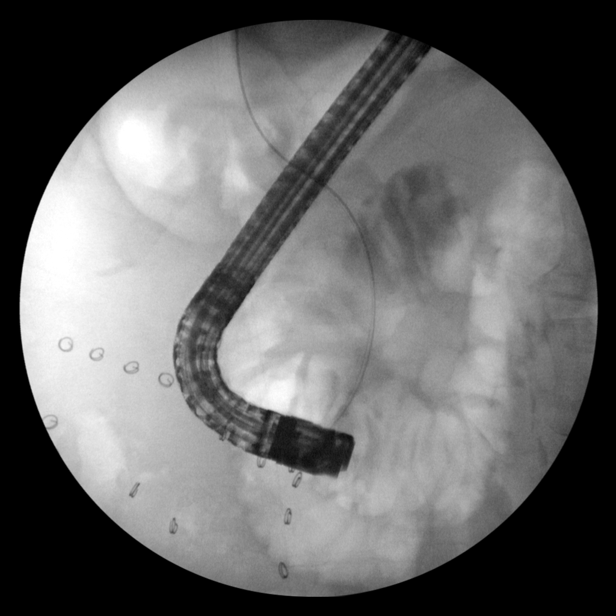
[im 7/15]
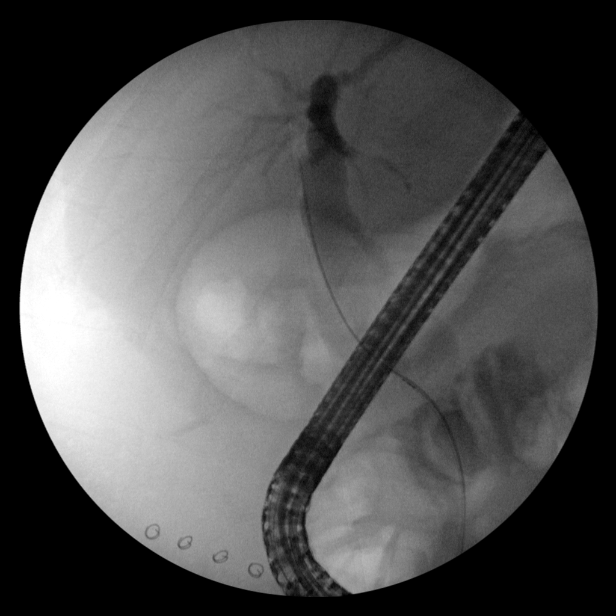
[im 8/15]
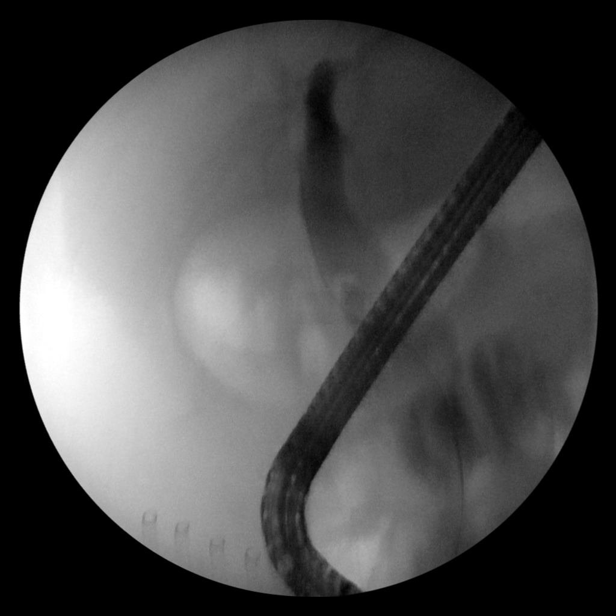
[im 8/15]
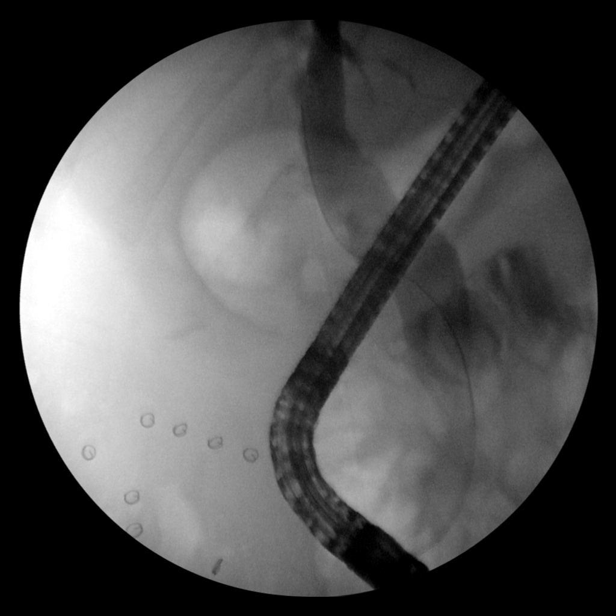
[im 9/15]
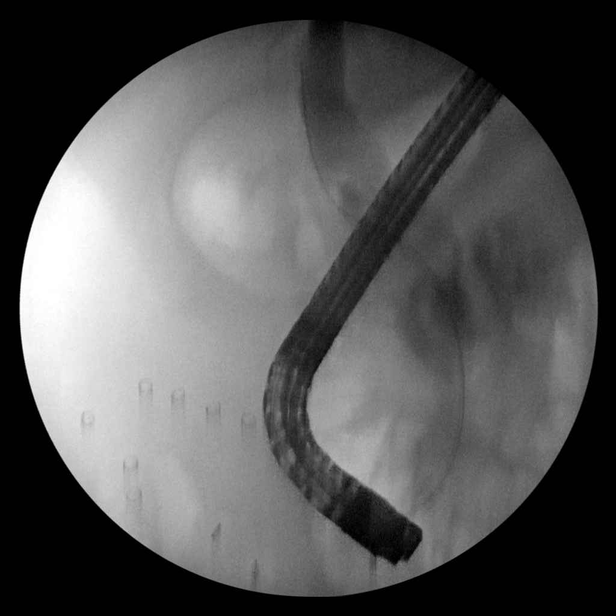
[im 12/15]
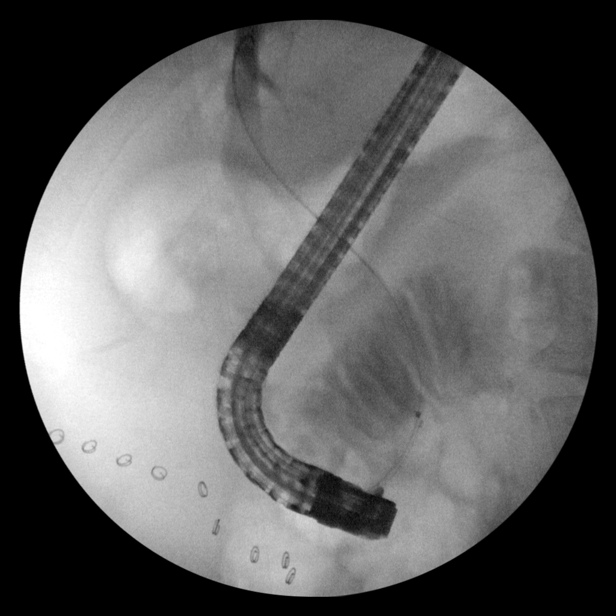
[im 13/15]
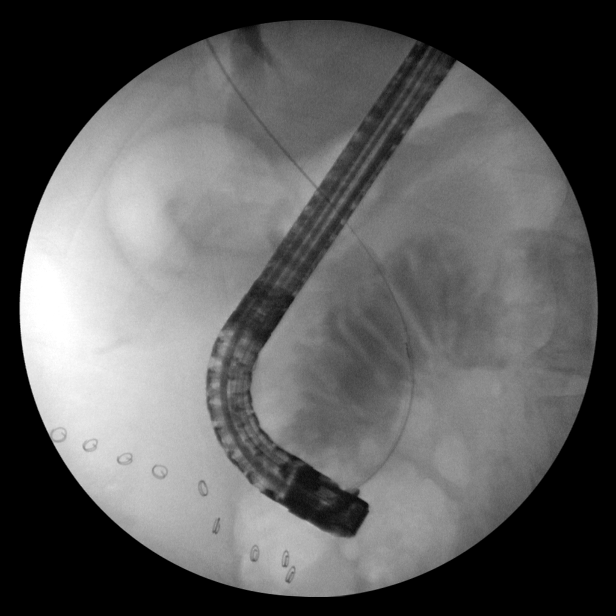
[im 13/15]
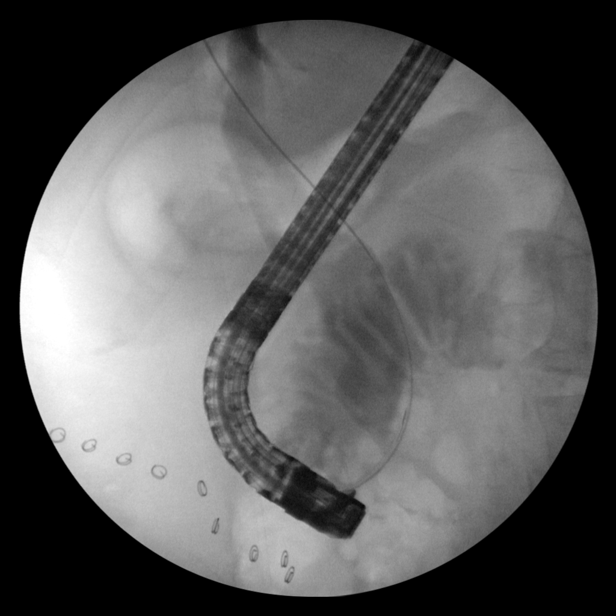
[im 15/15]
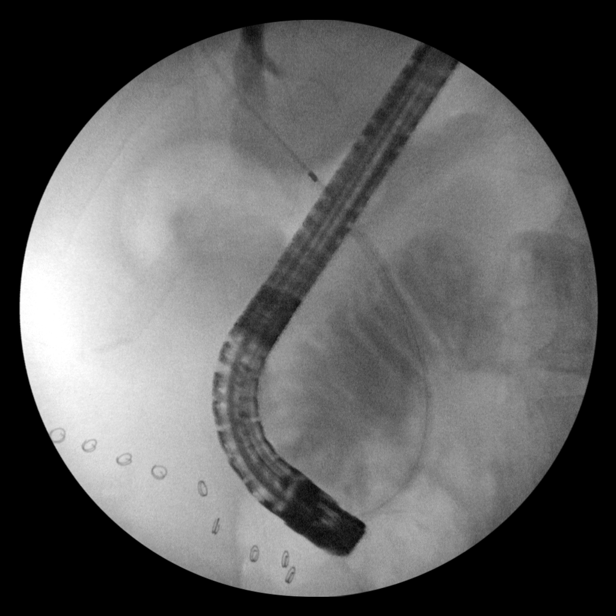

[15 of 24 positions shown; findings below may reference images not displayed]

FINDINGS: Imaging with a C-arm demonstrates removal of an indwelling
endoscopic CBD stent. Subsequent cannulation and opacification of
the common bile duct demonstrate significant dilatation of the
opacified duct and evidence of a probable distal CBD stricture. A
new endoscopic biliary stent was placed across the distal CBD
stricture.
IMPRESSION: Evidence of relative biliary obstruction secondary to a distal
common bile duct stricture. After removal of an indwelling
endoscopic CBD stent, a new endoscopic stent was placed across the
stricture.

These images were submitted for radiologic interpretation only.
Please see the procedural report for the amount of contrast and the
fluoroscopy time utilized.

## 2022-01-05 IMAGING — MR MR ABDOMEN WO/W CM MRCP
20 of 21 series · 45 of 48 positions shown · IV contrast (gadavist)
Comparison: 05/26/2010 CT.  ERCP note from 05/16/2020.

CLINICAL DATA: Abdominal pain. Biliary obstruction suspected.
Choledocholithiasis. History of biliary stent.

EXAM:
MRI ABDOMEN WITHOUT AND WITH CONTRAST (INCLUDING MRCP)
TECHNIQUE: Multiplanar multisequence MR imaging of the abdomen was performed
both before and after the administration of intravenous contrast.
Heavily T2-weighted images of the biliary and pancreatic ducts were
obtained, and three-dimensional MRCP images were rendered by post
processing.
CONTRAST:  7mL GADAVIST GADOBUTROL 1 MMOL/ML IV SOLN

[Series 4: ax haste · axial · 6.0mm · 1.19mm/px · z∈[-136,+73]mm · 2 of 30 slices shown]
[im 1/30]
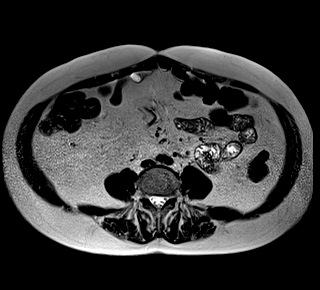
[im 30/30]
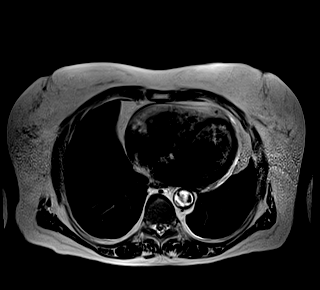

[Series 5: cor haste · coronal · 6.0mm · 1.25mm/px · 1 of 30 slices shown]
[im 1/30]
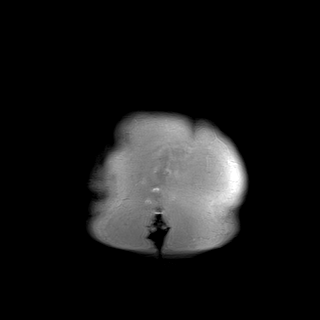

[Series 6: T2 fat-sat · axial · 6.0mm · 1.19mm/px · 1 of 30 slices shown]
[im 1/30]
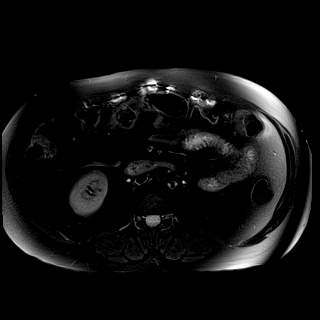

[Series 7: DWI · axial · 6.0mm · 1.42mm/px · 1 of 30 slices shown (1 of 4)]
[im 1/30]
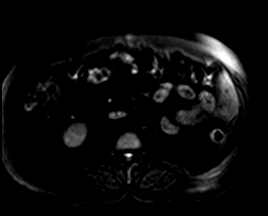

[Series 7: DWI · axial · 6.0mm · 1.42mm/px · 1 of 30 slices shown (2 of 4)]
[im 1/30]
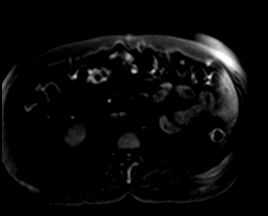

[Series 7: DWI · axial · 6.0mm · 1.42mm/px · 1 of 30 slices shown (3 of 4)]
[im 1/30]
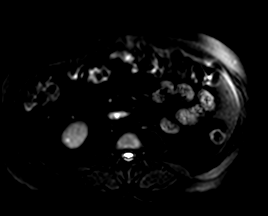

[Series 8: DWI · axial · 6.0mm · 1.42mm/px · 1 of 30 slices shown (4 of 4)]
[im 1/30]
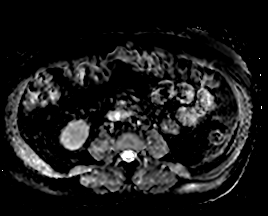

[Series 9: ax in and · axial · 3.0mm · 1.19mm/px · z∈[-106,+106]mm · 3 of 72 slices shown (1 of 2)]
[im 1/72]
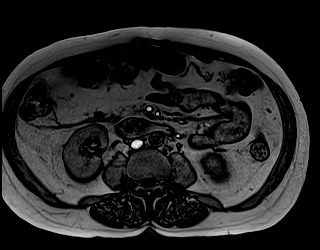
[im 36/72]
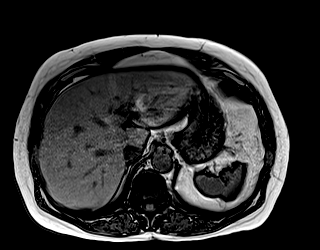
[im 72/72]
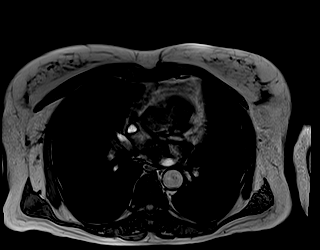

[Series 10: ax in and · axial · 3.0mm · 1.19mm/px · z∈[-106,+106]mm · 3 of 72 slices shown (2 of 2)]
[im 1/72]
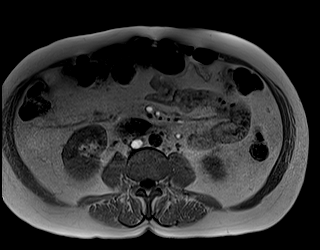
[im 36/72]
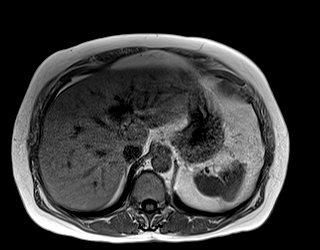
[im 72/72]
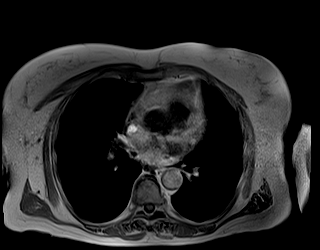

[Series 15: MRCP · coronal · 4.0mm · 1.12mm/px · 1 of 15 slices shown]
[im 1/15]
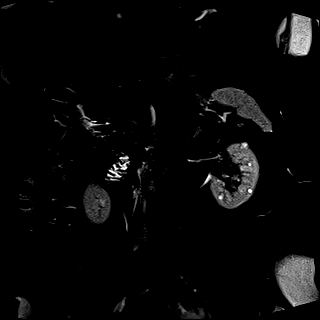

[Series 16: T1 dynamic · axial · non-contrast · 3.0mm · 1.19mm/px · z∈[-104,+109]mm · 3 of 72 slices shown (1 of 4)]
[im 1/72]
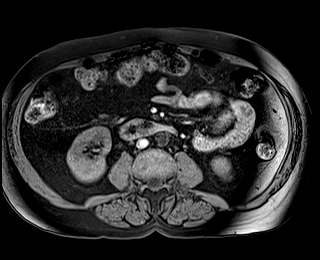
[im 36/72]
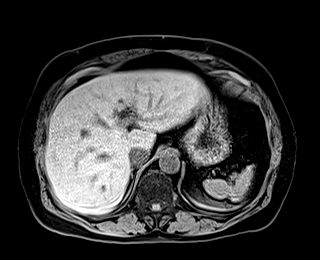
[im 72/72]
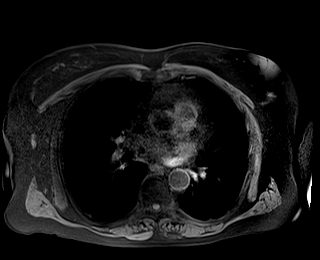

[Series 18: T1 dynamic post-contrast · axial · 3.0mm · 1.19mm/px · z∈[-104,+109]mm · 3 of 72 slices shown (1 of 6)]
[im 1/72]
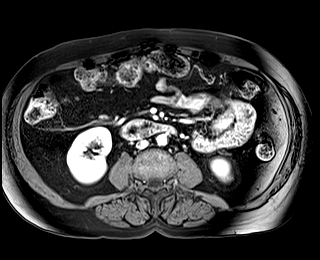
[im 36/72]
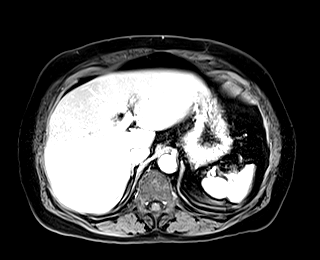
[im 72/72]
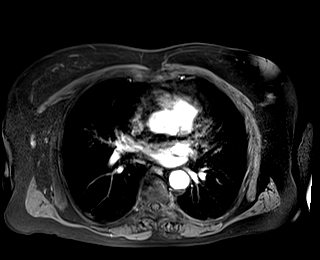

[Series 19: T1 dynamic · axial · 3.0mm · 1.19mm/px · z∈[-104,+109]mm · 3 of 72 slices shown (2 of 4)]
[im 1/72]
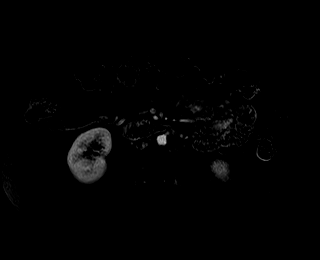
[im 36/72]
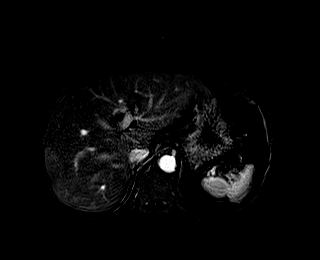
[im 72/72]
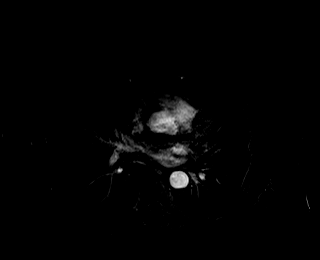

[Series 20: T1 dynamic post-contrast · axial · 3.0mm · 1.19mm/px · z∈[-104,+109]mm · 3 of 72 slices shown (2 of 6)]
[im 1/72]
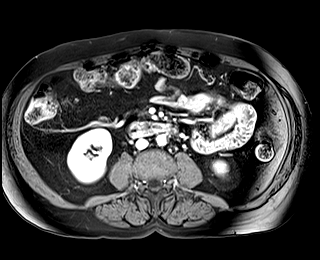
[im 36/72]
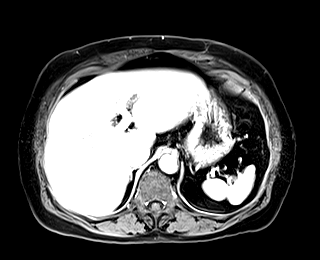
[im 72/72]
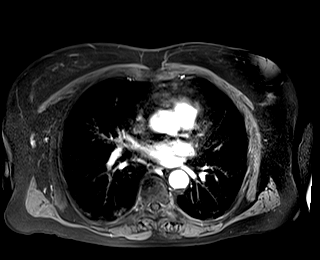

[Series 21: T1 dynamic · axial · 3.0mm · 1.19mm/px · z∈[-104,+109]mm · 3 of 72 slices shown (3 of 4)]
[im 1/72]
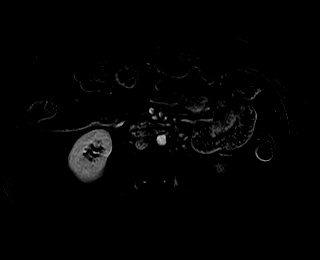
[im 36/72]
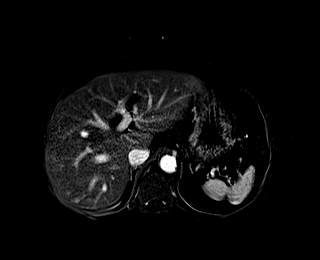
[im 72/72]
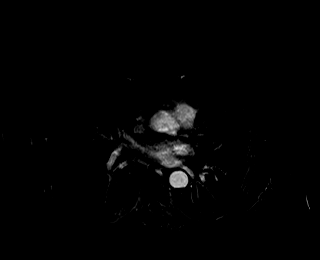

[Series 22: T1 dynamic post-contrast · axial · 3.0mm · 1.19mm/px · z∈[-104,+109]mm · 3 of 72 slices shown (3 of 6)]
[im 1/72]
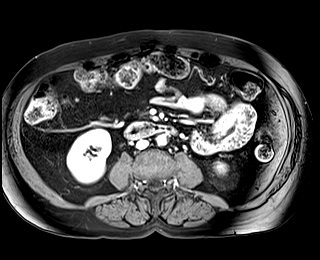
[im 36/72]
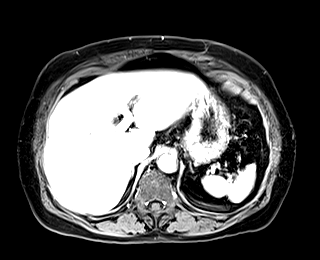
[im 72/72]
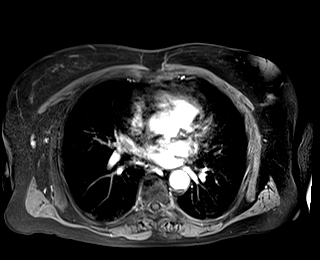

[Series 23: T1 dynamic · axial · 3.0mm · 1.19mm/px · z∈[-104,+109]mm · 3 of 72 slices shown (4 of 4)]
[im 1/72]
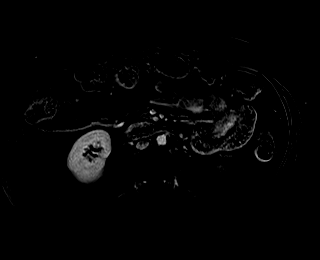
[im 36/72]
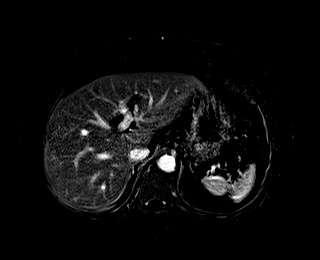
[im 72/72]
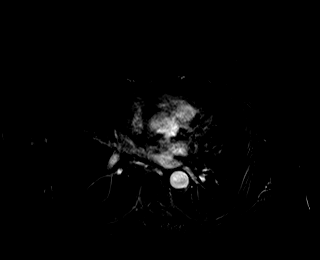

[Series 24: T1 dynamic post-contrast · coronal · 3.0mm · 1.31mm/px · 3 of 72 slices shown (4 of 6)]
[im 1/72]
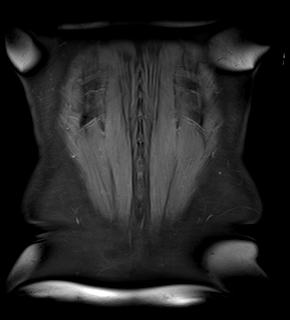
[im 36/72]
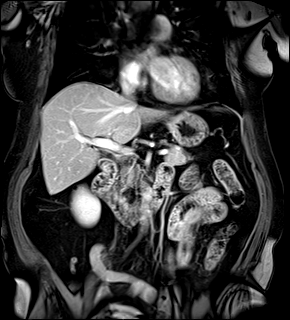
[im 72/72]
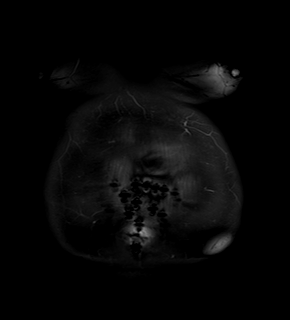

[Series 25: T1 dynamic post-contrast · axial · 3.0mm · 1.19mm/px · z∈[-104,+109]mm · 3 of 72 slices shown (5 of 6)]
[im 1/72]
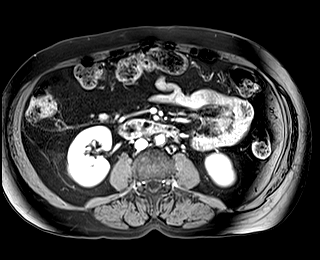
[im 36/72]
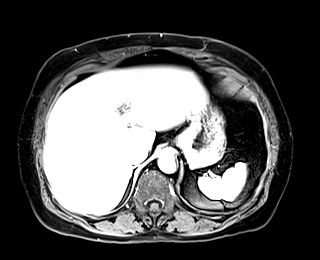
[im 72/72]
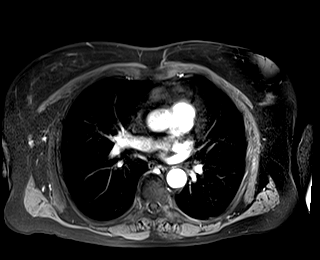

[Series 26: T1 dynamic post-contrast · axial · 3.0mm · 1.19mm/px · z∈[-104,+109]mm · 3 of 72 slices shown (6 of 6)]
[im 1/72]
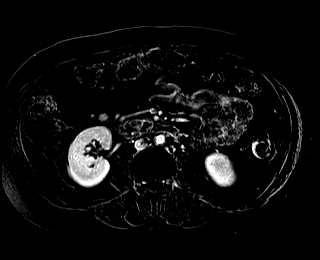
[im 36/72]
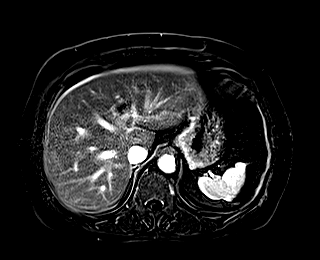
[im 72/72]
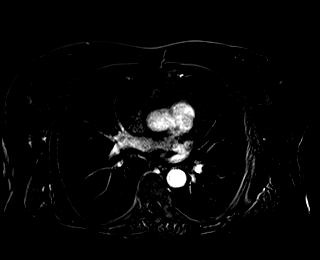

[45 of 48 positions shown; findings below may reference images not displayed]

FINDINGS: Lower chest: Mild cardiomegaly, without pericardial or pleural
effusion.

Hepatobiliary: No focal liver lesion. No intrahepatic biliary duct
dilatation.

Extensive pneumobilia. The common duct is mildly dilated including
at 12 mm on 40/2, similar to 1.3 cm on the prior CT (when
remeasured).

A common duct stent originates just above the pancreatic head and is
followed to the descending duodenum. The extent of pneumobilia and
the common duct stent make evaluation for choledocholithiasis
difficult. No dominant common duct stone identified. On precontrast
T1 weighted imaging, there is mild T1 hyperintensity along the
medial portion of the stent including on 59/16 at up to 5 mm.

Pancreas:  Normal, without mass or ductal dilatation.

Spleen:  Normal in size, without focal abnormality.

Adrenals/Urinary Tract: Normal adrenal glands. Bilateral tiny renal
lesions are likely cysts. A posterior interpolar left renal 1.1 cm
lesion on [DATE] the represents a Bosniak type 2 minimally complex
septated cyst or 2 adjacent cysts. No hydronephrosis.

Stomach/Bowel: A periampullary duodenal diverticulum is seen
adjacent the uncinate process including on 65/20. There is also a
porta hepatis gas and fluid filled structure of maximally 3.3 cm on
51/20. This is present back to 03/12/2019, where it measured 3.7 cm.
This is felt to communicate with the duodenum and represent a second
duodenal diverticulum. Otherwise normal stomach and small bowel
loops. Colonic stool burden suggests constipation.

Vascular/Lymphatic: Advanced aortic and branch vessel
atherosclerosis. No retroperitoneal or retrocrural adenopathy.

Other:  No ascites.

Musculoskeletal: No acute osseous abnormality.
IMPRESSION: 1. Similar common duct dilatation and pneumobilia. Given pneumobilia
and common duct stent, evaluation for choledocholithiasis is
limited. No dominant common duct stone. Cannot exclude small stone
or sludge around the medial portion of the distal common duct stent.
2. No other acute abdominal process.
3.  Aortic Atherosclerosis ([id number]).
4. Duodenal diverticula, as detailed above.
5.  Possible constipation.

## 2022-06-27 DIAGNOSIS — K2950 Unspecified chronic gastritis without bleeding: Secondary | ICD-10-CM | POA: Insufficient documentation

## 2022-06-27 DIAGNOSIS — K5520 Angiodysplasia of colon without hemorrhage: Secondary | ICD-10-CM | POA: Insufficient documentation

## 2022-07-11 DIAGNOSIS — K5000 Crohn's disease of small intestine without complications: Secondary | ICD-10-CM | POA: Insufficient documentation

## 2022-09-18 DIAGNOSIS — K5792 Diverticulitis of intestine, part unspecified, without perforation or abscess without bleeding: Secondary | ICD-10-CM | POA: Insufficient documentation

## 2022-11-18 ENCOUNTER — Encounter (HOSPITAL_COMMUNITY): Payer: Self-pay

## 2022-11-18 ENCOUNTER — Emergency Department (HOSPITAL_COMMUNITY)
Admission: EM | Admit: 2022-11-18 | Discharge: 2022-11-18 | Disposition: A | Discharge status: Home / Self Care | Payer: Medicare HMO | Attending: Emergency Medicine | Admitting: Emergency Medicine

## 2022-11-18 ENCOUNTER — Other Ambulatory Visit: Payer: Self-pay

## 2022-11-18 ENCOUNTER — Emergency Department (HOSPITAL_COMMUNITY): Payer: Medicare HMO

## 2022-11-18 DIAGNOSIS — K5732 Diverticulitis of large intestine without perforation or abscess without bleeding: Secondary | ICD-10-CM | POA: Insufficient documentation

## 2022-11-18 DIAGNOSIS — K5792 Diverticulitis of intestine, part unspecified, without perforation or abscess without bleeding: Secondary | ICD-10-CM

## 2022-11-18 DIAGNOSIS — R1032 Left lower quadrant pain: Secondary | ICD-10-CM | POA: Diagnosis present

## 2022-11-18 DIAGNOSIS — Z7901 Long term (current) use of anticoagulants: Secondary | ICD-10-CM | POA: Diagnosis not present

## 2022-11-18 DIAGNOSIS — Z7902 Long term (current) use of antithrombotics/antiplatelets: Secondary | ICD-10-CM | POA: Diagnosis not present

## 2022-11-18 DIAGNOSIS — N766 Ulceration of vulva: Secondary | ICD-10-CM

## 2022-11-18 DIAGNOSIS — I480 Paroxysmal atrial fibrillation: Secondary | ICD-10-CM | POA: Diagnosis not present

## 2022-11-18 HISTORY — DX: Diverticulitis of intestine, part unspecified, without perforation or abscess without bleeding: K57.92

## 2022-11-18 LAB — COMPREHENSIVE METABOLIC PANEL
ALT: 29 U/L (ref 0–44)
AST: 23 U/L (ref 15–41)
Albumin: 3.3 g/dL — ABNORMAL LOW (ref 3.5–5.0)
Alkaline Phosphatase: 75 U/L (ref 38–126)
Anion gap: 10 (ref 5–15)
BUN: 12 mg/dL (ref 8–23)
CO2: 25 mmol/L (ref 22–32)
Calcium: 8.5 mg/dL — ABNORMAL LOW (ref 8.9–10.3)
Chloride: 104 mmol/L (ref 98–111)
Creatinine, Ser: 0.77 mg/dL (ref 0.44–1.00)
GFR, Estimated: >60 mL/min (ref >60)
Glucose, Bld: 102 mg/dL — ABNORMAL HIGH (ref 70–99)
Potassium: 3.6 mmol/L (ref 3.5–5.1)
Sodium: 139 mmol/L (ref 135–145)
Total Bilirubin: 0.8 mg/dL (ref 0.3–1.2)
Total Protein: 6.8 g/dL (ref 6.5–8.1)

## 2022-11-18 LAB — CBC WITH DIFFERENTIAL/PLATELET
Abs Immature Granulocytes: 0.26 10*3/uL — ABNORMAL HIGH (ref 0.00–0.07)
Basophils Absolute: 0.1 10*3/uL (ref 0.0–0.1)
Basophils Relative: 1 %
Eosinophils Absolute: 0.1 10*3/uL (ref 0.0–0.5)
Eosinophils Relative: 1 %
HCT: 44.7 % (ref 36.0–46.0)
Hemoglobin: 14.1 g/dL (ref 12.0–15.0)
Immature Granulocytes: 2 %
Lymphocytes Relative: 23 %
Lymphs Abs: 3.1 10*3/uL (ref 0.7–4.0)
MCH: 29.1 pg (ref 26.0–34.0)
MCHC: 31.5 g/dL (ref 30.0–36.0)
MCV: 92.4 fL (ref 80.0–100.0)
Monocytes Absolute: 1.2 10*3/uL — ABNORMAL HIGH (ref 0.1–1.0)
Monocytes Relative: 9 %
Neutro Abs: 9 10*3/uL — ABNORMAL HIGH (ref 1.7–7.7)
Neutrophils Relative %: 64 %
Platelets: 596 10*3/uL — ABNORMAL HIGH (ref 150–400)
RBC: 4.84 MIL/uL (ref 3.87–5.11)
RDW: 14.3 % (ref 11.5–15.5)
WBC: 13.7 10*3/uL — ABNORMAL HIGH (ref 4.0–10.5)
nRBC: 0 % (ref 0.0–0.2)

## 2022-11-18 LAB — URINALYSIS, ROUTINE W REFLEX MICROSCOPIC
Bacteria, UA: NONE SEEN
Bilirubin Urine: NEGATIVE
Glucose, UA: NEGATIVE mg/dL
Ketones, ur: NEGATIVE mg/dL
Nitrite: NEGATIVE
Protein, ur: NEGATIVE mg/dL
Specific Gravity, Urine: 1.019 (ref 1.005–1.030)
pH: 5 (ref 5.0–8.0)

## 2022-11-18 LAB — RAPID HIV SCREEN (HIV 1/2 AB+AG)
HIV 1/2 Antibodies: NONREACTIVE
HIV-1 P24 Antigen - HIV24: NONREACTIVE

## 2022-11-18 LAB — WET PREP, GENITAL
Clue Cells Wet Prep HPF POC: NONE SEEN
Sperm: NONE SEEN
Trich, Wet Prep: NONE SEEN
Yeast Wet Prep HPF POC: NONE SEEN

## 2022-11-18 LAB — LIPASE, BLOOD: Lipase: 36 U/L (ref 11–51)

## 2022-11-18 MED ORDER — IOHEXOL 300 MG/ML  SOLN
100.0000 mL | Freq: Once | INTRAMUSCULAR | Status: AC | PRN
  Administered 2022-11-18: 100 mL via INTRAVENOUS

## 2022-11-18 MED ORDER — AMOXICILLIN-POT CLAVULANATE 875-125 MG PO TABS: 1.0000 | ORAL_TABLET | Freq: Two times a day (BID) | ORAL | 0 refills | Status: DC

## 2022-11-18 NOTE — ED Triage Notes (Addendum)
Pt comes in with abdominal pain starting last night. Pt denies N/V/D. Pt denies constipation last BM yesterday. Pt states hx of diverticulitis.

## 2022-11-18 NOTE — ED Provider Notes (Signed)
[place] EMERGENCY DEPARTMENT AT [facility] Provider Note   CSN: [id number] Arrival date & time: 11/18/22  4098     History  Chief Complaint  Patient presents with   Abdominal Pain    Olivia [name] is a 67 y.o. 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   Patient with left lower quadrant abdominal pain that began last night.  10 of 10 with palpation, not so severe without palpation.  She denies any fevers, chills, nausea vomiting diarrhea constipation, hematochezia/melena.  Last bowel movement was yesterday and it was normal for her.  She is a history of diverticulitis "with abscess" that was treated with antibiotics at [facility] 2 months ago.  Patient also reports vaginal discharge x 1 week, mucous-y in appearance, with swollen right labia. No pain, itching, vaginal bleeding. No sexual intercourse.  Chart review of [facility] records reveals admission in March for diverticulitis with abscess managed with antibiotics.  Patient also has history of colon resection due to perforated diverticulum as well as choledocholithiasis with CBD stricture and multiple ERCPs and sphincterotomies.   HPI     Home Medications Prior to Admission medications   Medication Sig Start Date End Date Taking? Authorizing Provider  amoxicillin-clavulanate (AUGMENTIN) 875-125 MG tablet Take 1 tablet by mouth every 12 (twelve) hours. 11/18/22  Yes Loetta Rough, MD  alendronate (FOSAMAX) 70 MG tablet Take 70 mg by mouth every Monday. Take with a full glass of water on an empty stomach.    [provider]  clopidogrel (PLAVIX) 75 MG tablet Take 1 tablet (75 mg  total) by mouth daily. Hold Plavix until 06/01/2020, restart if no further dark stools/bleeding Patient not taking: Reported on 07/23/2020 06/01/20   Shon Hale, MD  Cyanocobalamin (VITAMIN B 12 PO) Place 3,000 mcg under the tongue daily.    [provider]  Cyanocobalamin (VITAMIN B-12 IJ) Inject 1,000 mcg into the muscle daily as needed (low Hemogloben).    [provider]  Dexlansoprazole 30 MG capsule Take 1 capsule (30 mg total) by mouth daily. Take every day Patient taking differently: Take 30 mg by mouth daily. 05/26/20   Shon Hale, MD  dicyclomine (BENTYL) 10 MG capsule Take 1 capsule (10 mg total) by mouth 2 (two) times daily before a meal. Patient not taking: Reported on 07/23/2020 01/11/20   Malissa Hippo, MD  ergocalciferol (VITAMIN D2) 1.25 MG (50000 UT) capsule Take 50,000 Units by mouth every Tuesday.     [provider]  magnesium oxide (MAG-OX) 400 MG tablet Take 400 mg by mouth daily.    [provider]  meclizine (ANTIVERT) 12.5 MG tablet Take 12.5 mg by mouth 3 (three) times daily as needed for dizziness.     [provider]  metoCLOPramide (REGLAN) 10 MG tablet Take 10 mg by mouth every 8 (eight) hours as needed for nausea.    [provider]  Multiple Vitamins-Minerals (MULTIVITAMIN WITH MINERALS) tablet Take 1 tablet by mouth daily.    [provider]  Omega-3 Fatty Acids (OMEGA-3 FISH OIL) 1200 MG CAPS Take 1,200 mg by mouth daily.    [provider]  ondansetron (ZOFRAN) 4 MG tablet Take 4 mg by mouth every 8 (eight) hours as  needed for nausea or vomiting.    [provider]  OVER THE COUNTER MEDICATION Take 575 mg by mouth daily. NeuroPlex    [provider]  OVER THE COUNTER MEDICATION Take 350 mg by mouth daily. Hemp oil    [provider]  Potassium 99 MG TABS Take 99 mg by mouth daily.    [provider]  predniSONE (DELTASONE) 10 MG tablet Take 10 mg  by mouth daily. 05/12/20   [provider]  PSYLLIUM HUSK PO Take 2 capsules by mouth daily.    [provider]  rosuvastatin (CRESTOR) 40 MG tablet Take 40 mg by mouth at bedtime.    [provider]  sucralfate (CARAFATE) 1 g tablet Take 1 tablet (1 g total) by mouth 4 (four) times daily -  with meals and at bedtime. 08/30/20   Malissa Hippo, MD  zinc gluconate 50 MG tablet Take 50 mg by mouth daily.    [provider]      Allergies    Codeine    Review of Systems   Review of Systems Review of systems Negative for f/c.  A 10 point review of systems was performed and is negative unless otherwise reported in HPI.  Physical Exam Updated Vital Signs BP (!) 148/63   Pulse 62   Temp (!) 97.4 F (36.3 C) (Oral)   Resp 14   Ht 5\' 3"  (1.6 m)   Wt 73.5 kg   SpO2 98%   BMI 28.70 kg/m  Physical Exam Genitourinary:    Labia:        Right: Lesion present.       General: Normal appearing female, lying in bed.  HEENT: Sclera anicteric, MMM, trachea midline.  Cardiology: RRR, no murmurs/rubs/gallops. BL radial and DP pulses equal bilaterally.  Resp: Normal respiratory rate and effort. CTAB, no wheezes, rhonchi, crackles.  Abd: Mild TTP in LLQ. Soft,non-distended. No rebound tenderness or guarding.  Pelvic: 2x1 cm non-tender ulcer located on inferior R labia majora. No surrounding erythema/induration/fluctuance. No purulent drainage. Mild amount white thin discharge in vaginal fault, no cottage-cheese like discharge. Vagina ends in blind pouch w/ h/o hysterectomy.  MSK: No peripheral edema or signs of trauma. Extremities without deformity or TTP. No cyanosis or clubbing. Skin: warm, dry.  Neuro: A&Ox4, CNs II-XII grossly intact. MAEs. Sensation grossly intact.  Psych: Normal mood and affect.   ED Results / Procedures / Treatments   Labs (all labs ordered are listed, but only abnormal results are displayed) Labs Reviewed  WET PREP, GENITAL -  Abnormal; Notable for the following components:      Result Value   WBC, Wet Prep HPF POC FEW (*)    All other components within normal limits  CBC WITH DIFFERENTIAL/PLATELET - Abnormal; Notable for the following components:   WBC 13.7 (*)    Platelets 596 (*)    Neutro Abs 9.0 (*)    Monocytes Absolute 1.2 (*)    Abs Immature Granulocytes 0.26 (*)    All other components within normal limits  COMPREHENSIVE METABOLIC PANEL - Abnormal; Notable for the following components:   Glucose, Bld 102 (*)    Calcium 8.5 (*)    Albumin 3.3 (*)    All other components within normal limits  URINALYSIS, ROUTINE W REFLEX MICROSCOPIC - Abnormal; Notable for the following components:   Hgb urine dipstick SMALL (*)    Leukocytes,Ua LARGE (*)    All other components within normal limits  LIPASE, BLOOD  RAPID HIV SCREEN (HIV 1/2 AB+AG)  RPR  GC/CHLAMYDIA PROBE AMP ([place]) NOT AT [facility]    EKG EKG Interpretation  Date/Time:  Tuesday Nov 18 2022 10:10:34 EDT Ventricular Rate:  60 PR Interval:  145 QRS Duration: 105 QT Interval:  429 QTC Calculation: 429 R Axis:   74 Text Interpretation: Sinus rhythm Borderline T wave abnormalities No significant change since last tracing Confirmed by Melene Plan [phone]) on 11/19/2022 6:58:34 AM  Radiology No results found.  Procedures Procedures    Medications Ordered in ED Medications  iohexol (OMNIPAQUE) 300 MG/ML solution 100 mL (100 mLs Intravenous Contrast Given 11/18/22 1042)    ED Course/ Medical Decision Making/ A&P                          Medical Decision Making Amount and/or Complexity of Data Reviewed Labs: ordered. Decision-making details documented in ED Course. Radiology: ordered. Decision-making details documented in ED Course.  Risk Prescription drug management.    This patient presents to the ED for concern of abdominal pain, vaginal discharge; this involves an extensive number of treatment options, and is a complaint that  carries with it a high risk of complications and morbidity.  I considered the following differential and admission for this acute, potentially life threatening condition.   MDM:    For DDX for abdominal pain includes but is not limited to:  Abdominal exam without peritoneal signs. No evidence of acute abdomen at this time.   Consider diverticulitis given history, also consider intraabdominal abscess/infection, colitis/gastroenteritis. Lower suspicion for acute hepatobiliary disease (including acute cholecystitis or cholangitis), acute pancreatitis (neg lipase), PUD (including gastric perforation), acute infectious processes (pneumonia, hepatitis, pyelonephritis), acute appendicitis, vascular catastrophe, bowel obstruction.  For this patient's vaginal discharge, consider vulvovaginal candidiasis, vaginitis, STD, physiologic discharge.  No vaginal bleeding reported.  With abdominal pain accompanied by vaginal discharge, must consider PID/TOA, cervicitis.  Given patient's recent history of diverticulitis with abscess reported will start with CT imaging as opposed to ultrasound imaging, however if CT is unrevealing may consider getting a pelvic ultrasound for further workup. Patient's pelvic exam reveals a nontender ulcer concerning for syphilis. Will get RPR/HIV.    Clinical Course as of 12/02/22 1512  Tue Nov 18, 2022  0948 WBC(!): 13.7 [HN]  0948 Lipase: 36 [HN]  0948 Comprehensive metabolic panel(!) unremarkable [HN]  0951 Urinalysis, Routine w reflex microscopic -Urine, Clean Catch(!) Pyuria w/ no bacteria [HN]  1149 CT ABDOMEN PELVIS W CONTRAST 1. Acute uncomplicated diverticulitis of the distal descending and proximal sigmoid colon. 2. Prior cholecystectomy with chronic pneumobilia and mild extrahepatic biliary dilatation, mildly decreased from prior. 3. Nonobstructing nephrolithiasis. 4.  Aortic Atherosclerosis (ICD10-I70.0).   [HN]  1304 Rapid HIV screen (HIV 1/2 Ab+Ag) Neg [HN]     Clinical Course User Index [HN] Loetta Rough, MD    Labs: I Ordered, and personally interpreted labs.  The pertinent results include: Those listed above  Imaging Studies ordered: I ordered imaging studies including CT abdomen pelvis I independently visualized and interpreted imaging. I agree with the radiologist interpretation  Additional history obtained from chart review.  External records reviewed from [facility].  Cardiac Monitoring: The patient was maintained on a cardiac monitor.  I personally viewed and interpreted the cardiac monitored which showed an underlying rhythm of: NSR  Reevaluation: After the interventions noted above, I reevaluated the patient and found that they have :improved  Social Determinants of Health: Patient lives independently  with husband  Disposition:  PT with neg RPR/HIV. +Uncomplicated diverticulitis. Will be given augmentin and f/u with her GI as well as gynecology for her ulcer. She is stable for DC. Patient reports understanding of need for f/u.  All questions answered to patient's satisfaction.   Co morbidities that complicate the patient evaluation  Past Medical History:  Diagnosis Date   Diverticulitis    Dyslipidemia    PAD (peripheral artery disease) (HCC)    PONV (postoperative nausea and vomiting)    Psoriasis    RA (rheumatoid arthritis) (HCC)    Vertigo      Medicines Meds ordered this encounter  Medications   iohexol (OMNIPAQUE) 300 MG/ML solution 100 mL   amoxicillin-clavulanate (AUGMENTIN) 875-125 MG tablet    Sig: Take 1 tablet by mouth every 12 (twelve) hours.    Dispense:  14 tablet    Refill:  0    I have reviewed the patients home medicines and have made adjustments as needed  Problem List / ED Course: Problem List Items Addressed This Visit       Other   Diverticulitis - Primary   Other Visit Diagnoses     Genital ulcer, female                       This note was created using  dictation software, which may contain spelling or grammatical errors.    Loetta Rough, MD 12/02/22 [phone]

## 2022-11-18 NOTE — Discharge Instructions (Signed)
Thank you for coming to [facility] Emergency Department. You were seen for abdominal pain and vaginal discharge. We did an exam, labs, and imaging, and these showed diverticulitis without any perforation or abscess.  We have prescribed you Augmentin to take twice per day for 7 days for the diverticulitis.  Please eat small meals or use a liquid diet for the next several days in order to help your gut rest.  You also had a small genital ulcer noted on your exam.  You did not have any noted infection in your vagina however there are several labs that are still pending which need to be followed up on your MyChart.  Please call your gynecologist to make a follow-up appointment to be evaluated for this ulcer in your vaginal discharge.  You can call the one that you had your hysterectomy with or you can call the women and children Center at [facility]. Please follow up with your primary care provider and gynecologist within 1-2 weeks.  Do not hesitate to return to the ED or call 911 if you experience: -Worsening symptoms -fevers/chills -Nausea vomiting so severe that cannot eat or drink anything -Inability to have a bowel movement -Lightheadedness, passing out -Fevers/chills -Anything else that concerns you

## 2022-11-19 LAB — GC/CHLAMYDIA PROBE AMP (~~LOC~~) NOT AT ARMC
Chlamydia: NEGATIVE
Comment: NEGATIVE
Comment: NORMAL
Neisseria Gonorrhea: NEGATIVE

## 2022-11-19 LAB — RPR: RPR Ser Ql: NONREACTIVE

## 2022-12-04 ENCOUNTER — Encounter: Payer: Self-pay | Admitting: Obstetrics & Gynecology

## 2022-12-04 ENCOUNTER — Ambulatory Visit: Payer: Medicare HMO | Admitting: Obstetrics & Gynecology

## 2022-12-04 VITALS — BP 139/77 | HR 74 | Ht 63.0 in | Wt 162.8 lb

## 2022-12-04 DIAGNOSIS — N898 Other specified noninflammatory disorders of vagina: Secondary | ICD-10-CM

## 2022-12-04 NOTE — Progress Notes (Signed)
GYN VISIT Patient name: Olivia [name] MRN [id number]  Date of birth: Apr 19, 1956 Chief Complaint:   Gynecologic Exam  History of Present Illness:   Olivia [name] is a 67 y.o. PM, PH female being seen today for the following concerns:  -Vaginal discharge: About 2 wks ago- noted a gush of clear mucus and had to wear a pad for about a week- changing at least once a day.  Denies vaginal odor.  Discharge was clear.  Denies itching.  She reports it resolved on its own.  Patient seen in ER, wet prep was negative.  She reports no acute GYN concerns currently   No LMP recorded. Patient has had a hysterectomy.    Review of Systems:   Pertinent items are noted in HPI Denies fever/chills, dizziness, headaches, visual disturbances, fatigue, shortness of breath, chest pain, abdominal pain, vomiting.  Patient notes GI issues including diverticulitis Pertinent History Reviewed:   Past Surgical History:  Procedure Laterality Date   ANEURYSM COILING Right    right toe    BILIARY BRUSHING  05/16/2020   Procedure: BILIARY BRUSHING;  Surgeon: Malissa Hippo, MD;  Location: [facility];  Service: Endoscopy;;   BILIARY BRUSHING  07/26/2020   Procedure: BILIARY BRUSHING;  Surgeon: Rachael Fee, MD;  Location: [facility];  Service: Endoscopy;;   BILIARY STENT PLACEMENT  01/05/2020   Procedure: BILIARY STENT PLACEMENT;  Surgeon: Malissa Hippo, MD;  Location: [facility];  Service: Endoscopy;;   BILIARY STENT PLACEMENT  05/16/2020   Procedure: BILIARY STENT PLACEMENT;  Surgeon: Malissa Hippo, MD;  Location: [facility];  Service: Endoscopy;;   CHOLECYSTECTOMY     COLONOSCOPY  03/07/2015   Propofol; Surgeon: Dr. Audrie Gallus; Three 6 mm rectosigmoid polyps, moderate left-sided diverticulosis, tortuous rectosigmoid colon. Pathology with inflamed hyperplastic polyp.   COLONOSCOPY WITH PROPOFOL N/A 08/18/2019   non-bleeding internal hemorrhoids, pancolonic diverticulosis, two 4-7 mm polyps in rectum benign, no  adenomas. Next colonoscopy in 10 years.    ENDOSCOPIC RETROGRADE CHOLANGIOPANCREATOGRAPHY (ERCP) WITH PROPOFOL N/A 07/26/2020   Procedure: ENDOSCOPIC RETROGRADE CHOLANGIOPANCREATOGRAPHY (ERCP) WITH PROPOFOL;  Surgeon: Rachael Fee, MD;  Location: [facility];  Service: Endoscopy;  Laterality: N/A;   ERCP N/A 01/05/2020   Procedure: ENDOSCOPIC RETROGRADE CHOLANGIOPANCREATOGRAPHY (ERCP);  Surgeon: Malissa Hippo, MD;  Location: [facility];  Service: Endoscopy;  Laterality: N/A;   ERCP N/A 05/16/2020   Procedure: ENDOSCOPIC RETROGRADE CHOLANGIOPANCREATOGRAPHY (ERCP);  Surgeon: Malissa Hippo, MD;  Location: [facility];  Service: Endoscopy;  Laterality: N/A;   ESOPHAGOGASTRODUODENOSCOPY (EGD) WITH PROPOFOL N/A 07/26/2020   Procedure: ESOPHAGOGASTRODUODENOSCOPY (EGD) WITH PROPOFOL;  Surgeon: Rachael Fee, MD;  Location: [facility];  Service: Endoscopy;  Laterality: N/A;   GASTROINTESTINAL STENT REMOVAL N/A 05/16/2020   Procedure: STENT REMOVAL;  Surgeon: Malissa Hippo, MD;  Location: [facility];  Service: Endoscopy;  Laterality: N/A;   HYSTERECTOMY ABDOMINAL WITH SALPINGO-OOPHORECTOMY     ILEOSTOMY  2005   per patient for bowel obstruction and peritonitis with anastomosis   INSERTION OF MESH     RLQ   POLYPECTOMY  08/18/2019   Procedure: POLYPECTOMY;  Surgeon: Corbin Ade, MD;  Location: [facility];  Service: Endoscopy;;   REMOVAL OF STONES N/A 01/05/2020   Procedure: REMOVAL OF STONES;  Surgeon: Malissa Hippo, MD;  [facility]: [facility];  Service: Endoscopy;  Laterality: N/A;   SPHINCTEROTOMY N/A 01/05/2020   Procedure: SPHINCTEROTOMY;  Surgeon: Malissa Hippo, MD;  [facility]: [facility];  Service:  Endoscopy;  Laterality: N/A;   SPHINCTEROTOMY N/A 05/16/2020   Procedure: SPHINCTEROTOMY;  Surgeon: Malissa Hippo, MD;  Location: [facility];  Service: Endoscopy;  Laterality: N/A;   SPYGLASS CHOLANGIOSCOPY N/A 07/26/2020   Procedure: NWGNFAOZ CHOLANGIOSCOPY;  Surgeon: Rachael Fee, MD;  Location: [facility];  Service: Endoscopy;  Laterality: N/A;   STENT REMOVAL  07/26/2020   Procedure: STENT REMOVAL;  Surgeon: Rachael Fee, MD;  Location: [facility];  Service: Endoscopy;;   UPPER ESOPHAGEAL ENDOSCOPIC ULTRASOUND (EUS) N/A 07/26/2020   Procedure: UPPER ESOPHAGEAL ENDOSCOPIC ULTRASOUND (EUS);  Surgeon: Rachael Fee, MD;  Location: [facility];  Service: Endoscopy;  Laterality: N/A;    Past Medical History:  Diagnosis Date   Diverticulitis    Dyslipidemia    PAD (peripheral artery disease) (HCC)    PONV (postoperative nausea and vomiting)    Psoriasis    RA (rheumatoid arthritis) (HCC)    Vertigo    Reviewed problem list, medications and allergies. Physical Assessment:   Vitals:   12/04/22 1347  BP: 139/77  Pulse: 74  Weight: 162 lb 12.8 oz (73.8 kg)  Height: 5\' 3"  (1.6 m)  Body mass index is 28.84 kg/m.       Physical Examination:   General appearance: alert, well appearing, and in no distress  Psych: mood appropriate, normal affect  Skin: warm & dry   Cardiovascular: normal heart rate noted  Respiratory: normal respiratory effort, no distress  Abdomen: soft, non-tender   Pelvic: VULVA: normal appearing vulva with no masses, tenderness or lesions, VAGINA: Flat mucosa with minimal clear discharge no odor no masses or lesions appreciated.  Uterus and cervix surgically absent.  On bimanual exam some atrophic changes/stenosis noted but no other abnormalities appreciated extremities: no edema   Chaperone: Faith Rogue    Assessment & Plan:  1) vaginal discharge -No acute abnormalities noted on exam -Reviewed normal versus abnormal discharge -Follow-up as needed   No orders of the defined types were placed in this encounter.   Return if symptoms worsen or fail to improve.   [name], DO Attending Obstetrician & Gynecologist, [facility] for [employer], [facility]

## 2022-12-16 ENCOUNTER — Ambulatory Visit: Payer: Medicare HMO | Admitting: Internal Medicine

## 2022-12-16 ENCOUNTER — Encounter: Payer: Self-pay | Admitting: Internal Medicine

## 2022-12-16 VITALS — BP 180/79 | HR 69 | Temp 97.5°F | Ht 63.0 in | Wt 165.0 lb

## 2022-12-16 DIAGNOSIS — K5909 Other constipation: Secondary | ICD-10-CM

## 2022-12-16 DIAGNOSIS — K625 Hemorrhage of anus and rectum: Secondary | ICD-10-CM

## 2022-12-16 NOTE — Progress Notes (Unsigned)
Primary Care Physician:  [facility] [employer] Primary Gastroenterologist:  Dr. Jena Gauss  Pre-Procedure History & Physical: HPI:  Olivia [name] is a 67 y.o. female here for follow-up of diverticulitis and rectal bleeding.  Patient has extensive GI history over the past few years involving multiple gastroenterologist at multiple centers.  She has a history of diverticulitis complicated by transverse colon perforation requiring resection, ileostomy and subsequent takedown a few years ago.  Recurrent sigmoid diverticulitis documented on CT last month; she has recovered with antibiotics.  Intermittent rectal bleeding felt to be secondary to hemorrhoids.  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  IBD serologies negative at [facility].  Colonoscopy earlier this year without significant findings (diverticulosis) at [facility].  Extensive biliary history  -  distant cholecystectomy.  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  Also,  EUS,  stent biliary stent placement and removal.  Further biliary intervention reportedly performed at [facility] for persisting choledocholithiasis.  More recently her LFTs have been normal and she has had a decline in biliary ductal dilation.  She is followed by hematology in [place] for recurrent iron deficiency anemia she has been given iron transfusions.  She has multiple episodes of rectal bleeding on a regular basis particular when she is constipated;  recently she bled for 3 days.  She is on both clopidogrel and Xarelto.  When she starts having rectal bleeding she stopped these agents on her own and bleeding seems to resolve.  On 11/18/2022 H&H 14.1 44.7.  Also, LFTs completely  normal.  History of chronic constipation.  She was told to avoid constipation and take fiber to prevent recurrent diverticulitis.  She currently takes a dose of Metamucil and MiraLAX every 3 days.  GERD well-controlled on Dexilant 60 mg daily  Past Medical History:  Diagnosis Date   Diverticulitis    Dyslipidemia    PAD (peripheral artery disease) (HCC)    PONV (postoperative nausea and vomiting)    Psoriasis    RA (rheumatoid arthritis) (HCC)    Vertigo     Past Surgical History:  Procedure Laterality Date   ANEURYSM COILING Right    right toe    BILIARY BRUSHING  05/16/2020   Procedure: BILIARY BRUSHING;  Surgeon: Malissa Hippo, MD;  [facility];  Service: Endoscopy;;   BILIARY BRUSHING  07/26/2020   Procedure: BILIARY BRUSHING;  Surgeon: Rachael Fee, MD;  [facility];  Service: Endoscopy;;   BILIARY STENT PLACEMENT  01/05/2020   Procedure: BILIARY STENT PLACEMENT;  Surgeon: Malissa Hippo, MD;  [facility];  Service: Endoscopy;;   BILIARY STENT PLACEMENT  05/16/2020   Procedure: BILIARY STENT PLACEMENT;  Surgeon: Malissa Hippo, MD;  [facility]: [facility];  Service: Endoscopy;;   CHOLECYSTECTOMY     COLONOSCOPY  03/07/2015   Propofol; Surgeon: Dr. Audrie Gallus; Three 6 mm rectosigmoid polyps, moderate left-sided diverticulosis, tortuous rectosigmoid colon. Pathology with inflamed hyperplastic polyp.   COLONOSCOPY WITH PROPOFOL N/A 08/18/2019   non-bleeding internal hemorrhoids, pancolonic diverticulosis, two 4-7 mm polyps in rectum benign, no adenomas. Next colonoscopy in 10 years.    ENDOSCOPIC RETROGRADE CHOLANGIOPANCREATOGRAPHY (ERCP) WITH PROPOFOL N/A 07/26/2020   Procedure: ENDOSCOPIC RETROGRADE CHOLANGIOPANCREATOGRAPHY (ERCP) WITH PROPOFOL;  Surgeon: Christella Hartigan,  Melton Alar, MD;  Location: [facility];  Service: Endoscopy;  Laterality: N/A;   ERCP N/A 01/05/2020   Procedure: ENDOSCOPIC RETROGRADE CHOLANGIOPANCREATOGRAPHY (ERCP);  Surgeon: Malissa Hippo,  MD;  [facility];  Service: Endoscopy;  Laterality: N/A;   ERCP N/A 05/16/2020   Procedure: ENDOSCOPIC RETROGRADE CHOLANGIOPANCREATOGRAPHY (ERCP);  Surgeon: Malissa Hippo, MD;  [facility];  Service: Endoscopy;  Laterality: N/A;   ESOPHAGOGASTRODUODENOSCOPY (EGD) WITH PROPOFOL N/A 07/26/2020   Procedure: ESOPHAGOGASTRODUODENOSCOPY (EGD) WITH PROPOFOL;  Surgeon: Rachael Fee, MD;  [facility];  Service: Endoscopy;  Laterality: N/A;   GASTROINTESTINAL STENT REMOVAL N/A 05/16/2020   Procedure: STENT REMOVAL;  Surgeon: Malissa Hippo, MD;  [facility];  Service: Endoscopy;  Laterality: N/A;   HYSTERECTOMY ABDOMINAL WITH SALPINGO-OOPHORECTOMY     ILEOSTOMY  2005   per patient for bowel obstruction and peritonitis with anastomosis   INSERTION OF MESH     RLQ   POLYPECTOMY  08/18/2019   Procedure: POLYPECTOMY;  Surgeon: Corbin Ade, MD;  [facility];  Service: Endoscopy;;   REMOVAL OF STONES N/A 01/05/2020   Procedure: REMOVAL OF STONES;  Surgeon: Malissa Hippo, MD;  [facility];  Service: Endoscopy;  Laterality: N/A;   SPHINCTEROTOMY N/A 01/05/2020   Procedure: SPHINCTEROTOMY;  Surgeon: Malissa Hippo, MD;  [facility];  Service: Endoscopy;  Laterality: N/A;   SPHINCTEROTOMY N/A 05/16/2020   Procedure: SPHINCTEROTOMY;  Surgeon: Malissa Hippo, MD;  [facility];  Service: Endoscopy;  Laterality: N/A;   SPYGLASS CHOLANGIOSCOPY N/A 07/26/2020   Procedure: OZHYQMVH CHOLANGIOSCOPY;  Surgeon: Rachael Fee, MD;  [facility];  Service: Endoscopy;  Laterality: N/A;   STENT REMOVAL  07/26/2020   Procedure: STENT REMOVAL;  Surgeon: Rachael Fee, MD;  [facility];  Service: Endoscopy;;   UPPER ESOPHAGEAL ENDOSCOPIC ULTRASOUND (EUS) N/A 07/26/2020   Procedure: UPPER ESOPHAGEAL ENDOSCOPIC ULTRASOUND (EUS);  Surgeon: Rachael Fee, MD;  Location: [facility];  Service: Endoscopy;  Laterality:  N/A;    Prior to Admission medications   Medication Sig Start Date End Date Taking? Authorizing Provider  alendronate (FOSAMAX) 70 MG tablet Take 70 mg by mouth every Monday. Take with a full glass of water on an empty stomach.   Yes [provider]  atorvastatin (LIPITOR) 80 MG tablet Take 80 mg by mouth daily.   Yes [provider]  calcium-vitamin D (OSCAL WITH D) 500-5 MG-MCG tablet Take 1 tablet by mouth.   Yes [provider]  Cyanocobalamin (VITAMIN B 12 PO) Place 3,000 mcg under the tongue daily.   Yes [provider]  Cyanocobalamin (VITAMIN B-12 IJ) Inject 1,000 mcg into the muscle daily as needed (low Hemogloben).   Yes [provider]  Dexlansoprazole (DEXILANT) 30 MG capsule DR Take 30 mg by mouth 2 (two) times daily as needed.   Yes [provider]  dicyclomine (BENTYL) 10 MG capsule Take 10 mg by mouth daily as needed for spasms.   Yes [provider]  ergocalciferol (VITAMIN D2) 1.25 MG (50000 UT) capsule Take 50,000 Units by mouth every Tuesday.    Yes [provider]  metoprolol succinate (TOPROL-XL) 50 MG 24 hr tablet Take 50 mg by mouth 2 (two) times daily. 12/12/22  Yes [provider]  predniSONE (DELTASONE) 10 MG tablet Take 10 mg by mouth daily. 05/12/20  Yes [provider]  XARELTO 20 MG TABS tablet Take 20 mg  by mouth daily. 12/12/22  Yes [provider]    Allergies as of 12/16/2022 - Review Complete 12/16/2022  Allergen Reaction Noted   Codeine  05/02/2019    Family History  Problem Relation Age of Onset   Lung cancer Mother    Lung cancer Father    Stomach cancer Brother    Breast cancer Cousin    Colon cancer Neg Hx     Social History   Socioeconomic History   Marital status: Married    Spouse name: Not on file   Number of children: Not on file   Years of education: Not on file   Highest education level: Not on file  Occupational History   Not on file   Tobacco Use   Smoking status: Former    Packs/day: 0.50    Years: 50.00    Additional pack years: 0.00    Total pack years: 25.00    Types: Cigarettes    Quit date: 08/14/2018    Years since quitting: 4.3   Smokeless tobacco: Never  Vaping Use   Vaping Use: Never used  Substance and Sexual Activity   Alcohol use: Not Currently   Drug use: Never   Sexual activity: Yes    Birth control/protection: Post-menopausal, Surgical  Other Topics Concern   Not on file  Social History Narrative   Not on file   Social Determinants of Health   Financial Resource Strain: Not on file  Food Insecurity: Not on file  Transportation Needs: Not on file  Physical Activity: Not on file  Stress: Not on file  Social Connections: Not on file  Intimate Partner Violence: Not on file    Review of Systems: See HPI, otherwise negative ROS  Physical Exam: BP (!) 169/72 (BP Location: Right Arm, Patient Position: Sitting, Cuff Size: Normal)   Pulse 61   Temp (!) 97.5 F (36.4 C) (Oral)   Ht 5\' 3"  (1.6 m)   Wt 165 lb (74.8 kg)   SpO2 95%   BMI 29.23 kg/m  General:   Alert,  Well-developed, well-nourished, pleasant and cooperative in NAD Neck:  Supple; no masses or thyromegaly. No significant cervical adenopathy. Lungs:  Clear throughout to auscultation.   No wheezes, crackles, or rhonchi. No acute distress. Heart:  Regular rate and rhythm; no murmurs, clicks, rubs,  or gallops. Abdomen: Non-distended, normal bowel sounds.  Soft and nontender without appreciable mass or hepatosplenomegaly.  Rectal: No external lesions noted nontender digital rectal examination rectal vault is empty without palpable abnormality.  Mucus is Hemoccult negative.  Impression/Plan: 67 year old lady with recurrent rectal bleeding in the setting of antiplatelet and anticoagulation therapy.  History of iron deficiency anemia and bleeding.  History of complicated diverticulitis with uncomplicated recurrence.   Extensive  evaluation here and elsewhere history of duodenal AVMs  -  underwent APC ablation.  Diverticulosis on recent colonoscopy without other lesions reported.  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  IDA has required iron infusions.  Today her major issue is rectal bleeding likely related to benign anorectal source well as constipation-poorly managed..  She has had an extensive evaluation.  She is clinically recovered from diverticulitis.  Fortunately, it does not appear that she has any active biliary issues at this time.  GERD well-controlled on Dexilant.    Recommendations:  Take 1 Dexalant  (30 mg capsule) every day 30 minutes before breakfast.  Take Metamucil 1 dose every day without fail.  Use MiraLAX 1 capful in 8 ounces of  water nightly for any day that you do not have a bowel movement.  Keep a stool diary  Elevated Blood pressure today.  It will be rechecked.  Office visit with [place] in 8 weeks        Notice: This dictation was prepared with Dragon dictation along with smaller phrase technology. Any transcriptional errors that result from this process are unintentional and may not be corrected upon review.

## 2022-12-16 NOTE — Patient Instructions (Signed)
It was nice to see you today!  I have the following recommendations:  Take 1 Dexalant  (30 mg capsule) every day 30 minutes before breakfast.  Take Metamucil 1 dose every day without fail.  Use MiraLAX 1 capful in 8 ounces of water nightly for any day that you do not have a bowel movement.  The idea is to minimize constipation and not get "backed up".  Keep a stool diary  Your blood pressure was up today we will recheck it.  Office visit with [place] in 8 weeks

## 2023-02-17 ENCOUNTER — Ambulatory Visit (INDEPENDENT_AMBULATORY_CARE_PROVIDER_SITE_OTHER): Payer: Medicare HMO | Admitting: Internal Medicine

## 2023-02-17 ENCOUNTER — Encounter: Payer: Self-pay | Admitting: Internal Medicine

## 2023-02-17 VITALS — BP 156/73 | HR 62 | Temp 98.1°F | Ht 63.0 in | Wt 163.6 lb

## 2023-02-17 DIAGNOSIS — K219 Gastro-esophageal reflux disease without esophagitis: Secondary | ICD-10-CM | POA: Diagnosis not present

## 2023-02-17 DIAGNOSIS — K8050 Calculus of bile duct without cholangitis or cholecystitis without obstruction: Secondary | ICD-10-CM

## 2023-02-17 NOTE — Patient Instructions (Signed)
It was good to see you again today!  GERD information provided.  I recommend you take the Dexilant 30 minutes before breakfast and not after.  Metamucil 1 tablespoon daily should suffice for fiber supplementation.  A future colonoscopy or upper endoscopy is not recommended unless new symptoms develop.  Office visit with me in 1 year and as needed

## 2023-02-17 NOTE — Progress Notes (Signed)
Primary Care Physician:  [facility] [employer] Primary Gastroenterologist:  Dr. Jena Gauss  Pre-Procedure History & Physical: HPI:  Olivia [name] is a 67 y.o. female here for teen follow-up of GERD.  Patient has a significant GI history for recurrent biliary colic secondary to choledocholithiasis requiring multiple ERCPs locally, in [place] and at [place].  Stones were ultimately cleared.  Gallbladder was removed.  Since that time had a complicated course of diverticulitis with abscess resolved with conservative measures.  After the diverticulitis ,she underwent EGD and colonoscopy over at [facility] (December 2023).  She is doing well taking 2 tablespoons of Metamucil daily.  Takes Dexilant 30 mg daily after breakfast.  Review of [facility] evaluation and care everywhere.  Patient was evaluated for significant iron deficiency anemia back in December with EGD, colonoscopy capsule study; duodenal AVMs ablated with APC.  Duodenal lipoma confirmed with endoscopic ultrasound. Followed by hematology.  Patient denies any melena or rectal bleeding.  LFTs normal 3 months ago.  She continues on Xarelto for atrial fibrillation.  Past Medical History:  Diagnosis Date   Diverticulitis    Dyslipidemia    PAD (peripheral artery disease) (HCC)    PONV (postoperative nausea and vomiting)    Psoriasis    RA (rheumatoid arthritis) (HCC)    Vertigo     Past Surgical History:  Procedure Laterality Date   ANEURYSM COILING Right    right toe    BILIARY BRUSHING  05/16/2020   Procedure: BILIARY BRUSHING;  Surgeon: Malissa Hippo, MD;  [facility]: [facility];  Service: Endoscopy;;   BILIARY BRUSHING  07/26/2020   Procedure: BILIARY BRUSHING;  Surgeon: Rachael Fee, MD;  [facility]: [facility];  Service: Endoscopy;;   BILIARY STENT PLACEMENT  01/05/2020   Procedure: BILIARY STENT PLACEMENT;  Surgeon: Malissa Hippo, MD;  [facility];  Service: Endoscopy;;   BILIARY STENT PLACEMENT   05/16/2020   Procedure: BILIARY STENT PLACEMENT;  Surgeon: Malissa Hippo, MD;  [facility];  Service: Endoscopy;;   CHOLECYSTECTOMY     COLONOSCOPY  03/07/2015   Propofol; Surgeon: Dr. Audrie Gallus; Three 6 mm rectosigmoid polyps, moderate left-sided diverticulosis, tortuous rectosigmoid colon. Pathology with inflamed hyperplastic polyp.   COLONOSCOPY WITH PROPOFOL N/A 08/18/2019   non-bleeding internal hemorrhoids, pancolonic diverticulosis, two 4-7 mm polyps in rectum benign, no adenomas. Next colonoscopy in 10 years.    ENDOSCOPIC RETROGRADE CHOLANGIOPANCREATOGRAPHY (ERCP) WITH PROPOFOL N/A 07/26/2020   Procedure: ENDOSCOPIC RETROGRADE CHOLANGIOPANCREATOGRAPHY (ERCP) WITH PROPOFOL;  Surgeon: Rachael Fee, MD;  [facility];  Service: Endoscopy;  Laterality: N/A;   ERCP N/A 01/05/2020   Procedure: ENDOSCOPIC RETROGRADE CHOLANGIOPANCREATOGRAPHY (ERCP);  Surgeon: Malissa Hippo, MD;  [facility];  Service: Endoscopy;  Laterality: N/A;   ERCP N/A 05/16/2020   Procedure: ENDOSCOPIC RETROGRADE CHOLANGIOPANCREATOGRAPHY (ERCP);  Surgeon: Malissa Hippo, MD;  [facility];  Service: Endoscopy;  Laterality: N/A;   ESOPHAGOGASTRODUODENOSCOPY (EGD) WITH PROPOFOL N/A 07/26/2020   Procedure: ESOPHAGOGASTRODUODENOSCOPY (EGD) WITH PROPOFOL;  Surgeon: Rachael Fee, MD;  [facility];  Service: Endoscopy;  Laterality: N/A;   GASTROINTESTINAL STENT REMOVAL N/A 05/16/2020   Procedure: STENT REMOVAL;  Surgeon: Malissa Hippo, MD;  [facility];  Service: Endoscopy;  Laterality: N/A;   HYSTERECTOMY ABDOMINAL WITH SALPINGO-OOPHORECTOMY     ILEOSTOMY  2005   per patient for bowel obstruction and peritonitis with anastomosis   INSERTION OF MESH     RLQ   POLYPECTOMY  08/18/2019  Procedure: POLYPECTOMY;  Surgeon: Corbin Ade, MD;  [facility];  Service: Endoscopy;;   REMOVAL OF STONES N/A 01/05/2020   Procedure: REMOVAL OF STONES;  Surgeon: Malissa Hippo, MD;  [facility];  Service: Endoscopy;  Laterality: N/A;   SPHINCTEROTOMY N/A 01/05/2020   Procedure: SPHINCTEROTOMY;  Surgeon: Malissa Hippo, MD;  [facility];  Service: Endoscopy;  Laterality: N/A;   SPHINCTEROTOMY N/A 05/16/2020   Procedure: SPHINCTEROTOMY;  Surgeon: Malissa Hippo, MD;  [facility];  Service: Endoscopy;  Laterality: N/A;   SPYGLASS CHOLANGIOSCOPY N/A 07/26/2020   Procedure: VHQIONGE CHOLANGIOSCOPY;  Surgeon: Rachael Fee, MD;  [facility];  Service: Endoscopy;  Laterality: N/A;   STENT REMOVAL  07/26/2020   Procedure: STENT REMOVAL;  Surgeon: Rachael Fee, MD;  [facility];  Service: Endoscopy;;   UPPER ESOPHAGEAL ENDOSCOPIC ULTRASOUND (EUS) N/A 07/26/2020   Procedure: UPPER ESOPHAGEAL ENDOSCOPIC ULTRASOUND (EUS);  Surgeon: Rachael Fee, MD;  Location: [facility];  Service: Endoscopy;  Laterality: N/A;    Prior to Admission medications   Medication Sig Start Date End Date Taking? Authorizing Provider  alendronate (FOSAMAX) 70 MG tablet Take 70 mg by mouth every Monday. Take with a full glass of water on an empty stomach.   Yes [provider]  atorvastatin (LIPITOR) 80 MG tablet Take 80 mg by mouth daily.   Yes [provider]  calcium-vitamin D (OSCAL WITH D) 500-5 MG-MCG tablet Take 1 tablet by mouth.   Yes [provider]  Cyanocobalamin (VITAMIN B 12 PO) Place 3,000 mcg under the tongue daily.   Yes [provider]  Cyanocobalamin (VITAMIN B-12 IJ) Inject 1,000 mcg into the muscle daily as needed (low Hemogloben).   Yes [provider]  Dexlansoprazole (DEXILANT) 30 MG capsule DR Take 30 mg by mouth daily.   Yes [provider]  dicyclomine (BENTYL) 10 MG capsule Take 10 mg by mouth daily as needed for spasms.   Yes [provider]  ergocalciferol (VITAMIN D2) 1.25 MG (50000 UT) capsule Take 50,000 Units by mouth every Tuesday.    Yes  [provider]  metoprolol succinate (TOPROL-XL) 50 MG 24 hr tablet Take 50 mg by mouth 2 (two) times daily. 12/12/22  Yes [provider]  predniSONE (DELTASONE) 10 MG tablet Take 10 mg by mouth daily. 05/12/20  Yes [provider]  sulfaSALAzine (AZULFIDINE) 500 MG tablet Take 1,000 mg by mouth 2 (two) times daily. 01/12/23  Yes [provider]  XARELTO 20 MG TABS tablet Take 20 mg by mouth daily. 12/12/22  Yes [provider]    Allergies as of 02/17/2023 - Review Complete 02/17/2023  Allergen Reaction Noted   Codeine  05/02/2019    Family History  Problem Relation Age of Onset   Lung cancer Mother    Lung cancer Father    Stomach cancer Brother    Breast cancer Cousin    Colon cancer Neg Hx     Social History   Socioeconomic History   Marital status: Married    Spouse name: Not on file   Number of children: Not on file   Years of education: Not on file   Highest education level: Not on file  Occupational History   Not on file  Tobacco Use   Smoking status: Former    Current packs/day: 0.00    Average packs/day: 0.5 packs/day for 50.0 years (25.0 ttl pk-yrs)  Types: Cigarettes    Start date: 08/14/1968    Quit date: 08/14/2018    Years since quitting: 4.5   Smokeless tobacco: Never  Vaping Use   Vaping status: Never Used  Substance and Sexual Activity   Alcohol use: Not Currently   Drug use: Never   Sexual activity: Yes    Birth control/protection: Post-menopausal, Surgical  Other Topics Concern   Not on file  Social History Narrative   Not on file   Social Determinants of Health   Financial Resource Strain: Not on file  Food Insecurity: Not on file  Transportation Needs: Not on file  Physical Activity: Not on file  Stress: Not on file  Social Connections: Not on file  Intimate Partner Violence: Not on file    Review of Systems: See HPI, otherwise negative ROS  Physical Exam: BP (!) 156/73 (BP Location: Right  Arm, Patient Position: Sitting, Cuff Size: Normal)   Pulse 62   Temp 98.1 F (36.7 C) (Oral)   Ht 5\' 3"  (1.6 m)   Wt 163 lb 9.6 oz (74.2 kg)   SpO2 97%   BMI 28.98 kg/m  General:   Alert,  Well-developed, well-nourished, pleasant and cooperative in NAD Neck:  Supple; no masses or thyromegaly. No significant cervical adenopathy. Abdomen: Non-distended, normal bowel sounds.  Soft and nontender without appreciable mass or hepatosplenomegaly.   Impression/Plan: 67 year old lady with GERD well-controlled on Dexilant 30 mg daily.  Discussed timing of Dexilant administration.  Better to take it before a meal.  History of complicated diverticulitis as described up-to-date on colonoscopy/EGD at [facility] last year.  History of biliary colic secondary to cholecystitis choledocholithiasis requiring multiple endoscopic inventions.  Clinically, doing well now no recurrent symptoms.  LFTs normal.  History of iron deficiency anemia presumably secondary to occult blood loss from duodenal AVMs.  Extensive evaluation at [facility] December 2023 (e.g., colonoscopy, capsule and ablation of duodenal AVMs and enteroscopy).  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  Abdominal bloating she attributes to Metamucil.  Recommendations:  GERD information provided.  I recommend Dexilant 30 minutes before breakfast and not after.  Metamucil 1 tablespoon daily should suffice for fiber supplementation.  A future colonoscopy or upper endoscopy is not recommended unless new symptoms develop.  Office visit with me in 1 year and as needed  Keep follow-ups with hematology.  Notice: This dictation was prepared with Dragon dictation along with smaller phrase technology. Any transcriptional errors that result from this process are unintentional and may not be corrected upon review.

## 2023-05-15 ENCOUNTER — Ambulatory Visit: Payer: Medicare HMO | Admitting: Internal Medicine

## 2023-06-15 ENCOUNTER — Ambulatory Visit: Payer: Medicare HMO | Admitting: Gastroenterology

## 2023-07-03 ENCOUNTER — Emergency Department (HOSPITAL_COMMUNITY)
Admission: EM | Admit: 2023-07-03 | Discharge: 2023-07-03 | Discharge status: Against Medical Advice | Payer: Medicare HMO | Attending: Emergency Medicine | Admitting: Emergency Medicine

## 2023-07-03 ENCOUNTER — Other Ambulatory Visit: Payer: Self-pay

## 2023-07-03 ENCOUNTER — Encounter (HOSPITAL_COMMUNITY): Payer: Self-pay | Admitting: *Deleted

## 2023-07-03 DIAGNOSIS — Z5321 Procedure and treatment not carried out due to patient leaving prior to being seen by health care provider: Secondary | ICD-10-CM | POA: Insufficient documentation

## 2023-07-03 DIAGNOSIS — R1032 Left lower quadrant pain: Secondary | ICD-10-CM | POA: Insufficient documentation

## 2023-07-03 LAB — COMPREHENSIVE METABOLIC PANEL
ALT: 21 U/L (ref 0–44)
AST: 18 U/L (ref 15–41)
Albumin: 3.4 g/dL — ABNORMAL LOW (ref 3.5–5.0)
Alkaline Phosphatase: 65 U/L (ref 38–126)
Anion gap: 8 (ref 5–15)
BUN: 10 mg/dL (ref 8–23)
CO2: 24 mmol/L (ref 22–32)
Calcium: 8.8 mg/dL — ABNORMAL LOW (ref 8.9–10.3)
Chloride: 103 mmol/L (ref 98–111)
Creatinine, Ser: 0.77 mg/dL (ref 0.44–1.00)
GFR, Estimated: >60 mL/min (ref >60)
Glucose, Bld: 110 mg/dL — ABNORMAL HIGH (ref 70–99)
Potassium: 3.4 mmol/L — ABNORMAL LOW (ref 3.5–5.1)
Sodium: 135 mmol/L (ref 135–145)
Total Bilirubin: 0.6 mg/dL (ref <1.2)
Total Protein: 6.8 g/dL (ref 6.5–8.1)

## 2023-07-03 LAB — URINALYSIS, ROUTINE W REFLEX MICROSCOPIC
Bacteria, UA: NONE SEEN
Bilirubin Urine: NEGATIVE
Glucose, UA: NEGATIVE mg/dL
Ketones, ur: NEGATIVE mg/dL
Nitrite: NEGATIVE
Protein, ur: NEGATIVE mg/dL
Specific Gravity, Urine: 1.004 — ABNORMAL LOW (ref 1.005–1.030)
pH: 6 (ref 5.0–8.0)

## 2023-07-03 LAB — LIPASE, BLOOD: Lipase: 39 U/L (ref 11–51)

## 2023-07-03 LAB — CBC
HCT: 43.9 % (ref 36.0–46.0)
Hemoglobin: 13.9 g/dL (ref 12.0–15.0)
MCH: 30.3 pg (ref 26.0–34.0)
MCHC: 31.7 g/dL (ref 30.0–36.0)
MCV: 95.6 fL (ref 80.0–100.0)
Platelets: 569 10*3/uL — ABNORMAL HIGH (ref 150–400)
RBC: 4.59 MIL/uL (ref 3.87–5.11)
RDW: 13.8 % (ref 11.5–15.5)
WBC: 14.8 10*3/uL — ABNORMAL HIGH (ref 4.0–10.5)
nRBC: 0 % (ref 0.0–0.2)

## 2023-07-03 NOTE — ED Triage Notes (Addendum)
Pt c/o LLQ abdominal pain that started 12/22, worsening last night. Denies fever, n/v. Pt reports hx of intestinal abscess in that area and is concerned it might be infected again. Pt also reports her skin feels warm over the painful area.

## 2023-07-15 ENCOUNTER — Ambulatory Visit: Payer: Medicare HMO | Admitting: Gastroenterology

## 2023-07-20 ENCOUNTER — Ambulatory Visit: Payer: Medicare HMO | Admitting: Gastroenterology

## 2023-10-21 ENCOUNTER — Telehealth: Payer: Self-pay | Admitting: Internal Medicine

## 2023-10-21 NOTE — Telephone Encounter (Signed)
 Patient left a message to schedule an appt with Dr. Riley Cheadle.  She stated that recently was hospitalized in [place] with Sepsis and wanted to get in to see him.

## 2023-11-02 ENCOUNTER — Ambulatory Visit: Admitting: Internal Medicine

## 2023-11-02 ENCOUNTER — Encounter: Payer: Self-pay | Admitting: Internal Medicine

## 2023-11-02 ENCOUNTER — Other Ambulatory Visit: Payer: Self-pay | Admitting: *Deleted

## 2023-11-02 VITALS — BP 133/74 | HR 69 | Temp 98.7°F | Ht 63.0 in | Wt 160.4 lb

## 2023-11-02 DIAGNOSIS — Z8719 Personal history of other diseases of the digestive system: Secondary | ICD-10-CM

## 2023-11-02 DIAGNOSIS — K5792 Diverticulitis of intestine, part unspecified, without perforation or abscess without bleeding: Secondary | ICD-10-CM

## 2023-11-02 DIAGNOSIS — Z09 Encounter for follow-up examination after completed treatment for conditions other than malignant neoplasm: Secondary | ICD-10-CM | POA: Diagnosis not present

## 2023-11-02 DIAGNOSIS — K219 Gastro-esophageal reflux disease without esophagitis: Secondary | ICD-10-CM

## 2023-11-02 NOTE — Progress Notes (Unsigned)
 Primary Care Physician:  [name], Aida House Primary Gastroenterologist:  Dr. Riley Cheadle  Pre-Procedure History & Physical: HPI:  Olivia [name] is a 68 y.o. female here for for follow-up of recurrent diverticulitis.  This lady has a long complicated course of diverticulitis multiple episodes.  Presented about 20 years ago with transverse colon perforation secondary to diverticulitis requiring colostomy with sepsis subsequent takedown.  She has had recurrent episodes since that time she was admitted to [facility] last year for diverticulitis with abscess this resolved with conservative management.  Just 3 weeks ago she was admitted with sepsis secondary to diverticulitis at [facility].  CT noted severe diverticulosis with sigmoid diverticulitis.  No abscess.  No free air.  She improved with antibiotics was hospitalized for about 3 days.  She has extensive pancolonic diverticulosis on prior colonoscopy.  Her last colonoscopy was in December 2023 at [facility] severe diverticulosis noted they also noted ulcers in her TI she was taking NSAIDs at that time.  Biopsies revealed mild ileitis with no granulomas.  She stopped taking NSAIDs.  IBD panel at [facility] last year came back negative. I understand surgeons at [facility] were hesitant to perform further resection due to the fact that she has pancolonic diverticulosis. GI history significant multiple ERCPs due to choledocholithiasis.  Endoscopic evaluations for small bowel AVMs with bleeding requiring APC. Reported history of atrial fibrillation for which she is chronically anticoagulated. Reflux well-controlled on Dexilant  daily.  Past Medical History:  Diagnosis Date   Diverticulitis    Dyslipidemia    PAD (peripheral artery disease) (HCC)    PONV (postoperative nausea and vomiting)    Psoriasis    RA (rheumatoid arthritis) (HCC)    Vertigo     Past Surgical History:  Procedure Laterality Date   ANEURYSM COILING Right     right toe    BILIARY BRUSHING  05/16/2020   Procedure: BILIARY BRUSHING;  Surgeon: Ruby Corporal, MD;  Location: [facility];  Service: Endoscopy;;   BILIARY BRUSHING  07/26/2020   Procedure: BILIARY BRUSHING;  Surgeon: Janel Medford, MD;  Location: [facility];  Service: Endoscopy;;   BILIARY STENT PLACEMENT  01/05/2020   Procedure: BILIARY STENT PLACEMENT;  Surgeon: Ruby Corporal, MD;  Location: [facility];  Service: Endoscopy;;   BILIARY STENT PLACEMENT  05/16/2020   Procedure: BILIARY STENT PLACEMENT;  Surgeon: Ruby Corporal, MD;  Location: [facility];  Service: Endoscopy;;   CHOLECYSTECTOMY     COLONOSCOPY  03/07/2015   Propofol ; Surgeon: Dr. Petel; Three 6 mm rectosigmoid polyps, moderate left-sided diverticulosis, tortuous rectosigmoid colon. Pathology with inflamed hyperplastic polyp.   COLONOSCOPY WITH PROPOFOL  N/A 08/18/2019   non-bleeding internal hemorrhoids, pancolonic diverticulosis, two 4-7 mm polyps in rectum benign, no adenomas. Next colonoscopy in 10 years.    ENDOSCOPIC RETROGRADE CHOLANGIOPANCREATOGRAPHY (ERCP) WITH PROPOFOL  N/A 07/26/2020   Procedure: ENDOSCOPIC RETROGRADE CHOLANGIOPANCREATOGRAPHY (ERCP) WITH PROPOFOL ;  Surgeon: Janel Medford, MD;  Location: [facility];  Service: Endoscopy;  Laterality: N/A;   ERCP N/A 01/05/2020   Procedure: ENDOSCOPIC RETROGRADE CHOLANGIOPANCREATOGRAPHY (ERCP);  Surgeon: Ruby Corporal, MD;  [facility];  Service: Endoscopy;  Laterality: N/A;   ERCP N/A 05/16/2020   Procedure: ENDOSCOPIC RETROGRADE CHOLANGIOPANCREATOGRAPHY (ERCP);  Surgeon: Ruby Corporal, MD;  Location: [facility];  Service: Endoscopy;  Laterality: N/A;   ESOPHAGOGASTRODUODENOSCOPY (EGD) WITH PROPOFOL  N/A 07/26/2020   Procedure: ESOPHAGOGASTRODUODENOSCOPY (EGD) WITH PROPOFOL ;  Surgeon: Janel Medford, MD;  Location: [facility];  Service: Endoscopy;  Laterality: N/A;   GASTROINTESTINAL STENT REMOVAL N/A 05/16/2020   Procedure: STENT REMOVAL;  Surgeon:  Ruby Corporal, MD;  Location: [facility];  Service: Endoscopy;  Laterality: N/A;   HYSTERECTOMY ABDOMINAL WITH SALPINGO-OOPHORECTOMY     ILEOSTOMY  2005   per patient for bowel obstruction and peritonitis with anastomosis   INSERTION OF MESH     RLQ   POLYPECTOMY  08/18/2019   Procedure: POLYPECTOMY;  Surgeon: Suzette Espy, MD;  Location: [facility];  Service: Endoscopy;;   REMOVAL OF STONES N/A 01/05/2020   Procedure: REMOVAL OF STONES;  Surgeon: Ruby Corporal, MD;  [facility];  Service: Endoscopy;  Laterality: N/A;   SPHINCTEROTOMY N/A 01/05/2020   Procedure: SPHINCTEROTOMY;  Surgeon: Ruby Corporal, MD;  [facility];  Service: Endoscopy;  Laterality: N/A;   SPHINCTEROTOMY N/A 05/16/2020   Procedure: SPHINCTEROTOMY;  Surgeon: Ruby Corporal, MD;  Location: [facility];  Service: Endoscopy;  Laterality: N/A;   SPYGLASS CHOLANGIOSCOPY N/A 07/26/2020   Procedure: ZOXWRUEA CHOLANGIOSCOPY;  Surgeon: Janel Medford, MD;  [facility]: [facility];  Service: Endoscopy;  Laterality: N/A;   STENT REMOVAL  07/26/2020   Procedure: STENT REMOVAL;  Surgeon: Janel Medford, MD;  [facility]: [facility];  Service: Endoscopy;;   UPPER ESOPHAGEAL ENDOSCOPIC ULTRASOUND (EUS) N/A 07/26/2020   Procedure: UPPER ESOPHAGEAL ENDOSCOPIC ULTRASOUND (EUS);  Surgeon: Janel Medford, MD;  Location: [facility];  Service: Endoscopy;  Laterality: N/A;    Prior to Admission medications   Medication Sig Start Date End Date Taking? Authorizing Provider  alendronate (FOSAMAX) 70 MG tablet Take 70 mg by mouth every Monday. Take with a full glass of water  on an empty stomach.   Yes [provider]  amiodarone (PACERONE) 200 MG tablet Take 200 mg by mouth daily.   Yes [provider]  amLODipine (NORVASC) 5 MG tablet Take 5 mg by mouth 2 (two) times daily.   Yes [provider]  atorvastatin (LIPITOR) 80 MG tablet Take 80 mg by mouth daily.   Yes [provider]   calcium -vitamin D (OSCAL WITH D) 500-5 MG-MCG tablet Take 1 tablet by mouth.   Yes [provider]  Cyanocobalamin (VITAMIN B 12 PO) Place 3,000 mcg under the tongue daily.   Yes [provider]  Cyanocobalamin (VITAMIN B-12 IJ) Inject 1,000 mcg into the muscle daily as needed (low Hemogloben).   Yes [provider]  Dexlansoprazole  (DEXILANT ) 30 MG capsule DR Take 30 mg by mouth daily.   Yes [provider]  dicyclomine  (BENTYL ) 10 MG capsule Take 10 mg by mouth daily as needed for spasms.   Yes [provider]  ergocalciferol (VITAMIN D2) 1.25 MG (50000 UT) capsule Take 50,000 Units by mouth every Tuesday.    Yes [provider]  metoprolol succinate (TOPROL-XL) 50 MG 24 hr tablet Take 50 mg by mouth 2 (two) times daily. 12/12/22  Yes [provider]  predniSONE  (DELTASONE ) 10 MG tablet Take 10 mg by mouth daily. 05/12/20  Yes [provider]  sulfaSALAzine (AZULFIDINE) 500 MG tablet Take 1,000 mg by mouth 2 (two) times daily. 01/12/23  Yes [provider]  XARELTO 20 MG TABS tablet Take 20 mg by mouth daily. 12/12/22  Yes [provider]    Allergies as of 11/02/2023 - Review Complete 11/02/2023  Allergen Reaction Noted   Codeine  05/02/2019    Family History  Problem Relation Age of Onset   Lung cancer  Mother    Lung cancer Father    Stomach cancer Brother    Breast cancer Cousin    Colon cancer Neg Hx     Social History   Socioeconomic History   Marital status: Married    Spouse name: Not on file   Number of children: Not on file   Years of education: Not on file   Highest education level: Not on file  Occupational History   Not on file  Tobacco Use   Smoking status: Former    Current packs/day: 0.00    Average packs/day: 0.5 packs/day for 50.0 years (25.0 ttl pk-yrs)    Types: Cigarettes    Start date: 08/14/1968    Quit date: 08/14/2018    Years since quitting: 5.2   Smokeless tobacco:  Never  Vaping Use   Vaping status: Never Used  Substance and Sexual Activity   Alcohol use: Yes    Comment: "not much" per pt as of 07/03/23   Drug use: Never   Sexual activity: Yes    Birth control/protection: Post-menopausal, Surgical  Other Topics Concern   Not on file  Social History Narrative   Not on file   Social Drivers of Health   Financial Resource Strain: Not on file  Food Insecurity: Not on file  Transportation Needs: Not on file  Physical Activity: Not on file  Stress: Not on file  Social Connections: Not on file  Intimate Partner Violence: Not on file    Review of Systems: See HPI, otherwise negative ROS  Physical Exam: BP 133/74 (BP Location: Right Arm, Patient Position: Sitting, Cuff Size: Normal)   Pulse 69   Temp 98.7 F (37.1 C) (Oral)   Ht 5\' 3"  (1.6 m)   Wt 160 lb 6.4 oz (72.8 kg)   SpO2 95%   BMI 28.41 kg/m  General:   Alert,  well-nourished, pleasant and cooperative in NAD Accompanied by her spouse. Neck:  Supple; no masses or thyromegaly. No significant cervical adenopathy. Lungs:  Clear throughout to auscultation.   No wheezes, crackles, or rhonchi. No acute distress. Heart:  Regular rate and rhythm; no murmurs, clicks, rubs,  or gallops. Abdomen: Non-distended, normal bowel sounds.  Soft and nontender without appreciable mass or hepatosplenomegaly.   Impression/Plan: Very very pleasant 69 year old lady with a long history of recurrent, complicated, diverticulitis.  History of perforation with resection previously. Sleep populated diverticulosis.  Last colonoscopy about a year and a half ago as outlined above.  Interesting is some ulceration of the TI noted in the setting of NSAID use.  No granulomas.  IBD panel negative.  I have no reason at all to think that this lady will not have recurrent bouts of diverticulitis in the future.  She got a forward another complicated about requiring emergent resection and diverting procedure.  I do not  think she has Crohn's disease.  Recommendations:  Continue Metamucil daily.  I explained to the patient there is no way to prevent a future bout of diverticulitis short of total /subtotal colectomy.  Colectomy.  I recommend we get an updated surgical opinion.  Patient would like to see Dr. Joyce Nixon with [facility] surgery.  I will arrange that referral.  For now, I am not planning to repeat her colonoscopy.  If an updated colonoscopy is desired by surgery, we can certainly get that done.  Further recommendations to follow.     Notice: This dictation was prepared with Dragon dictation along with smaller phrase technology. Any  transcriptional errors that result from this process are unintentional and may not be corrected upon review.

## 2023-11-02 NOTE — Patient Instructions (Signed)
 It was good to see you again today  Continue Metamucil daily  As discussed, I recommend you go see Dr. Joyce Nixon a colorectal surgeon in [place].  We will make that referral.  Will get her recommendations as to surgical options she would recommend.

## 2023-11-10 ENCOUNTER — Encounter (HOSPITAL_COMMUNITY): Payer: Self-pay | Admitting: Emergency Medicine

## 2023-11-10 ENCOUNTER — Other Ambulatory Visit: Payer: Self-pay

## 2023-11-10 ENCOUNTER — Inpatient Hospital Stay (HOSPITAL_COMMUNITY)
Admission: EM | Admit: 2023-11-10 | Discharge: 2023-11-18 | DRG: 392 | Disposition: A | Discharge status: Home Health | Attending: Family Medicine | Admitting: Family Medicine

## 2023-11-10 DIAGNOSIS — I10 Essential (primary) hypertension: Secondary | ICD-10-CM | POA: Diagnosis present

## 2023-11-10 DIAGNOSIS — I480 Paroxysmal atrial fibrillation: Secondary | ICD-10-CM | POA: Diagnosis present

## 2023-11-10 DIAGNOSIS — Z800 Family history of malignant neoplasm of digestive organs: Secondary | ICD-10-CM

## 2023-11-10 DIAGNOSIS — K5792 Diverticulitis of intestine, part unspecified, without perforation or abscess without bleeding: Secondary | ICD-10-CM | POA: Diagnosis not present

## 2023-11-10 DIAGNOSIS — Z87442 Personal history of urinary calculi: Secondary | ICD-10-CM

## 2023-11-10 DIAGNOSIS — E785 Hyperlipidemia, unspecified: Secondary | ICD-10-CM | POA: Diagnosis present

## 2023-11-10 DIAGNOSIS — Z7983 Long term (current) use of bisphosphonates: Secondary | ICD-10-CM

## 2023-11-10 DIAGNOSIS — Z9049 Acquired absence of other specified parts of digestive tract: Secondary | ICD-10-CM

## 2023-11-10 DIAGNOSIS — D649 Anemia, unspecified: Secondary | ICD-10-CM | POA: Diagnosis present

## 2023-11-10 DIAGNOSIS — K5720 Diverticulitis of large intestine with perforation and abscess without bleeding: Secondary | ICD-10-CM | POA: Diagnosis not present

## 2023-11-10 DIAGNOSIS — K219 Gastro-esophageal reflux disease without esophagitis: Secondary | ICD-10-CM | POA: Diagnosis present

## 2023-11-10 DIAGNOSIS — Z79899 Other long term (current) drug therapy: Secondary | ICD-10-CM

## 2023-11-10 DIAGNOSIS — E871 Hypo-osmolality and hyponatremia: Secondary | ICD-10-CM | POA: Diagnosis present

## 2023-11-10 DIAGNOSIS — Z90710 Acquired absence of both cervix and uterus: Secondary | ICD-10-CM

## 2023-11-10 DIAGNOSIS — N74 Female pelvic inflammatory disorders in diseases classified elsewhere: Secondary | ICD-10-CM | POA: Diagnosis present

## 2023-11-10 DIAGNOSIS — I700 Atherosclerosis of aorta: Secondary | ICD-10-CM | POA: Diagnosis present

## 2023-11-10 DIAGNOSIS — Z803 Family history of malignant neoplasm of breast: Secondary | ICD-10-CM

## 2023-11-10 DIAGNOSIS — L409 Psoriasis, unspecified: Secondary | ICD-10-CM | POA: Diagnosis present

## 2023-11-10 DIAGNOSIS — N321 Vesicointestinal fistula: Secondary | ICD-10-CM | POA: Diagnosis present

## 2023-11-10 DIAGNOSIS — Z7901 Long term (current) use of anticoagulants: Secondary | ICD-10-CM

## 2023-11-10 DIAGNOSIS — E876 Hypokalemia: Secondary | ICD-10-CM | POA: Diagnosis present

## 2023-11-10 DIAGNOSIS — B9620 Unspecified Escherichia coli [E. coli] as the cause of diseases classified elsewhere: Secondary | ICD-10-CM | POA: Diagnosis present

## 2023-11-10 DIAGNOSIS — M069 Rheumatoid arthritis, unspecified: Secondary | ICD-10-CM | POA: Diagnosis present

## 2023-11-10 DIAGNOSIS — Z801 Family history of malignant neoplasm of trachea, bronchus and lung: Secondary | ICD-10-CM

## 2023-11-10 DIAGNOSIS — Z885 Allergy status to narcotic agent status: Secondary | ICD-10-CM

## 2023-11-10 DIAGNOSIS — Z87440 Personal history of urinary (tract) infections: Secondary | ICD-10-CM

## 2023-11-10 DIAGNOSIS — I739 Peripheral vascular disease, unspecified: Secondary | ICD-10-CM | POA: Diagnosis present

## 2023-11-10 DIAGNOSIS — Z87891 Personal history of nicotine dependence: Secondary | ICD-10-CM

## 2023-11-10 LAB — COMPREHENSIVE METABOLIC PANEL WITH GFR
ALT: 18 U/L (ref 0–44)
AST: 18 U/L (ref 15–41)
Albumin: 3.3 g/dL — ABNORMAL LOW (ref 3.5–5.0)
Alkaline Phosphatase: 66 U/L (ref 38–126)
Anion gap: 11 (ref 5–15)
BUN: 8 mg/dL (ref 8–23)
CO2: 25 mmol/L (ref 22–32)
Calcium: 9.2 mg/dL (ref 8.9–10.3)
Chloride: 101 mmol/L (ref 98–111)
Creatinine, Ser: 1.07 mg/dL — ABNORMAL HIGH (ref 0.44–1.00)
GFR, Estimated: 56 mL/min — ABNORMAL LOW (ref >60)
Glucose, Bld: 108 mg/dL — ABNORMAL HIGH (ref 70–99)
Potassium: 3 mmol/L — ABNORMAL LOW (ref 3.5–5.1)
Sodium: 137 mmol/L (ref 135–145)
Total Bilirubin: 0.6 mg/dL (ref 0.0–1.2)
Total Protein: 7.5 g/dL (ref 6.5–8.1)

## 2023-11-10 LAB — URINALYSIS, ROUTINE W REFLEX MICROSCOPIC
Bacteria, UA: NONE SEEN
Bilirubin Urine: NEGATIVE
Glucose, UA: NEGATIVE mg/dL
Ketones, ur: NEGATIVE mg/dL
Nitrite: NEGATIVE
Protein, ur: 100 mg/dL — AB
Specific Gravity, Urine: 1.017 (ref 1.005–1.030)
WBC, UA: >50 WBC/hpf (ref 0–5)
pH: 6 (ref 5.0–8.0)

## 2023-11-10 LAB — CBC
HCT: 44.7 % (ref 36.0–46.0)
Hemoglobin: 14.3 g/dL (ref 12.0–15.0)
MCH: 29.5 pg (ref 26.0–34.0)
MCHC: 32 g/dL (ref 30.0–36.0)
MCV: 92.2 fL (ref 80.0–100.0)
Platelets: 597 10*3/uL — ABNORMAL HIGH (ref 150–400)
RBC: 4.85 MIL/uL (ref 3.87–5.11)
RDW: 14.4 % (ref 11.5–15.5)
WBC: 21.7 10*3/uL — ABNORMAL HIGH (ref 4.0–10.5)
nRBC: 0 % (ref 0.0–0.2)

## 2023-11-10 LAB — LIPASE, BLOOD: Lipase: 33 U/L (ref 11–51)

## 2023-11-10 MED ORDER — POTASSIUM CHLORIDE 10 MEQ/100ML IV SOLN
10.0000 meq | INTRAVENOUS | Status: AC
  Administered 2023-11-11: 10 meq via INTRAVENOUS
  Filled 2023-11-10 (×2): qty 100

## 2023-11-10 MED ORDER — MORPHINE SULFATE (PF) 4 MG/ML IV SOLN
4.0000 mg | Freq: Once | INTRAVENOUS | Status: DC
  Filled 2023-11-10: qty 1

## 2023-11-10 MED ORDER — SODIUM CHLORIDE 0.9 % IV BOLUS
1000.0000 mL | Freq: Once | INTRAVENOUS | Status: AC
  Administered 2023-11-11: 1000 mL via INTRAVENOUS

## 2023-11-10 MED ORDER — ONDANSETRON HCL 4 MG/2ML IJ SOLN
4.0000 mg | Freq: Once | INTRAMUSCULAR | Status: AC
  Administered 2023-11-11: 4 mg via INTRAVENOUS
  Filled 2023-11-10: qty 2

## 2023-11-10 NOTE — ED Triage Notes (Signed)
 Pt arrived pov c/o left lower abd pain x1 week, worsens with BM, pt called PCP who suggested she come to ED for CT scan. Hx of diverticulitis.

## 2023-11-10 NOTE — ED Provider Notes (Signed)
 [place] EMERGENCY DEPARTMENT AT [facility] Provider Note   CSN: [id number] Arrival date & time: 11/10/23  1907     History {Add pertinent medical, surgical, social history, OB history to HPI:1} Chief Complaint  Patient presents with   Abdominal Pain    Olivia [name] is a 68 y.o. female with PMHx diverticulitis, HLD, PAD, RA, psoriasis, GERD, afib who presents to ED concerned for left lower abdominal pain x1 week that worsens with BM. Patient referred by PCP to the ED given hx of diverticulitis. Last BM today. Patient stating that she currently on Cipro  for UTI that she was diagnosed with 1 week ago. Patient stating that she is following with outpatient  GI provider Dr. Elvin Hammer who is evaluating patient for possible ostomy given recurrent diverticulitis. Patient is better with lying flat - worse when moving around. Patient took Tylenol around 10AM today. Patient wondering if her diverticular abscess is recurring.  Denies fever, nausea, vomiting, diarrhea, dysuria, hematuria, hematochezia.    Abdominal Pain      Home Medications Prior to Admission medications   Medication Sig Start Date End Date Taking? Authorizing Provider  alendronate (FOSAMAX) 70 MG tablet Take 70 mg by mouth every Monday. Take with a full glass of water  on an empty stomach.    [provider]  amiodarone (PACERONE) 200 MG tablet Take 200 mg by mouth daily.    [provider]  amLODipine (NORVASC) 5 MG tablet Take 5 mg by mouth 2 (two) times daily.    [provider]  atorvastatin (LIPITOR) 80 MG tablet Take 80 mg by mouth daily.    [provider]  calcium -vitamin D (OSCAL WITH D) 500-5 MG-MCG tablet Take 1 tablet by mouth.    [provider]  Cyanocobalamin (VITAMIN B 12 PO) Place 3,000 mcg under the tongue daily.    [provider]  Cyanocobalamin (VITAMIN B-12 IJ) Inject 1,000 mcg into the muscle daily as needed (low Hemogloben).    [provider]  Dexlansoprazole  (DEXILANT ) 30 MG capsule DR Take 30 mg by mouth daily.    [provider]  dicyclomine  (BENTYL ) 10 MG capsule Take 10 mg by mouth daily as needed for spasms.    [provider]  ergocalciferol (VITAMIN D2) 1.25 MG (50000 UT) capsule Take 50,000 Units by mouth every Tuesday.     [provider]  metoprolol succinate (TOPROL-XL) 50 MG 24 hr tablet Take 50 mg by mouth 2 (two) times daily. 12/12/22   [provider]  predniSONE  (DELTASONE ) 10 MG tablet Take 10 mg by mouth daily. 05/12/20   [provider]  sulfaSALAzine (AZULFIDINE) 500 MG tablet Take 1,000 mg by mouth 2 (two) times daily. 01/12/23   [provider]  XARELTO 20 MG TABS tablet Take 20 mg by mouth daily. 12/12/22   [provider]      Allergies    Codeine    Review of Systems   Review of Systems  Gastrointestinal:  Positive for abdominal pain.    Physical Exam Updated Vital Signs BP 133/60 (BP Location: Left Arm)   Pulse 81   Temp 98.4 F (36.9 C) (Oral)   Resp 17   SpO2 95%  Physical Exam  ED Results / Procedures / Treatments   Labs (all labs ordered are listed, but only abnormal results are displayed) Labs Reviewed  COMPREHENSIVE METABOLIC PANEL WITH GFR - Abnormal; Notable for the following components:      Result Value  Potassium 3.0 (*)    Glucose, Bld 108 (*)    Creatinine, Ser 1.07 (*)    Albumin 3.3 (*)    GFR, Estimated 56 (*)    All other components within normal limits  CBC - Abnormal; Notable for the following components:   WBC 21.7 (*)    Platelets 597 (*)    All other components within normal limits  URINALYSIS, ROUTINE W REFLEX MICROSCOPIC - Abnormal; Notable for the following components:   APPearance TURBID (*)    Hgb urine dipstick SMALL (*)    Protein, ur 100 (*)    Leukocytes,Ua MODERATE (*)    All other components within normal limits  LIPASE, BLOOD    EKG None  Radiology No results  found.  Procedures Procedures  {Document cardiac monitor, telemetry assessment procedure when appropriate:1}  Medications Ordered in ED Medications - No data to display  ED Course/ Medical Decision Making/ A&P   {   Click here for ABCD2, HEART and other calculatorsREFRESH Note before signing :1}                              Medical Decision Making Amount and/or Complexity of Data Reviewed Labs: ordered. Radiology: ordered.    This patient presents to the ED for concern of abdominal pain, this involves an extensive number of treatment options, and is a complaint that carries with it a high risk of complications and morbidity.  The differential diagnosis includes gastroenteritis, colitis, small bowel obstruction, appendicitis, cholecystitis, pancreatitis, nephrolithiasis, UTI, pyleonephritis, ***ruptured ectopic pregnancy, PID, ovarian/***testicular torsion.   Co morbidities that complicate the patient evaluation  ***   Additional history obtained:  Additional history obtained from *** External records from outside source obtained and reviewed including ***   Consultations Obtained:  I requested consultation with the ***,  and discussed lab and imaging findings as well as pertinent plan - they recommend: ***   Problem List / ED Course / Critical interventions / Medication management  Patient presented for abdominal pain. On exam patient was *** I Ordered, and personally interpreted labs.  *** I ordered imaging studies including ***CT Abd/Pelvis with contrast: evaluate for structural/surgical etiology of patients' severe abdominal pain.  I independently visualized and interpreted imaging and I agree with the radiologist interpretation of ***. I personally viewed and interpreted the EKG/cardiac monitored which showed an underlying rhythm of: *** I have reviewed the patients home medicines and have made adjustments as needed The patient has been appropriately medically  screened and/or stabilized in the ED. I have low suspicion for any other emergent medical condition which would require further screening, evaluation or treatment in the ED or require inpatient management. At time of discharge the patient is hemodynamically stable and in no acute distress. I have discussed work-up results and diagnosis with patient and answered all questions. Patient is agreeable with discharge plan. We discussed strict return precautions for returning to the emergency department and they verbalized understanding.     Social Determinants of Health:  none    {Document critical care time when appropriate:1} {Document review of labs and clinical decision tools ie heart score, Chads2Vasc2 etc:1}  {Document your independent review of radiology images, and any outside records:1} {Document your discussion with family members, caretakers, and with consultants:1} {Document social determinants of health affecting pt's care:1} {Document your decision making why or why not admission, treatments were needed:1} Final Clinical Impression(s) / ED Diagnoses Final diagnoses:  None    Rx / DC Orders ED Discharge Orders     None

## 2023-11-11 ENCOUNTER — Encounter (HOSPITAL_COMMUNITY): Payer: Self-pay

## 2023-11-11 ENCOUNTER — Inpatient Hospital Stay (HOSPITAL_COMMUNITY)

## 2023-11-11 ENCOUNTER — Emergency Department (HOSPITAL_COMMUNITY)

## 2023-11-11 DIAGNOSIS — K219 Gastro-esophageal reflux disease without esophagitis: Secondary | ICD-10-CM | POA: Diagnosis present

## 2023-11-11 DIAGNOSIS — E876 Hypokalemia: Secondary | ICD-10-CM | POA: Diagnosis present

## 2023-11-11 DIAGNOSIS — M069 Rheumatoid arthritis, unspecified: Secondary | ICD-10-CM | POA: Diagnosis present

## 2023-11-11 DIAGNOSIS — Z87440 Personal history of urinary (tract) infections: Secondary | ICD-10-CM | POA: Diagnosis not present

## 2023-11-11 DIAGNOSIS — Z9049 Acquired absence of other specified parts of digestive tract: Secondary | ICD-10-CM | POA: Diagnosis not present

## 2023-11-11 DIAGNOSIS — Z90710 Acquired absence of both cervix and uterus: Secondary | ICD-10-CM | POA: Diagnosis not present

## 2023-11-11 DIAGNOSIS — N74 Female pelvic inflammatory disorders in diseases classified elsewhere: Secondary | ICD-10-CM | POA: Diagnosis present

## 2023-11-11 DIAGNOSIS — K5792 Diverticulitis of intestine, part unspecified, without perforation or abscess without bleeding: Secondary | ICD-10-CM

## 2023-11-11 DIAGNOSIS — Z7983 Long term (current) use of bisphosphonates: Secondary | ICD-10-CM | POA: Diagnosis not present

## 2023-11-11 DIAGNOSIS — I10 Essential (primary) hypertension: Secondary | ICD-10-CM | POA: Diagnosis present

## 2023-11-11 DIAGNOSIS — I480 Paroxysmal atrial fibrillation: Secondary | ICD-10-CM | POA: Diagnosis present

## 2023-11-11 DIAGNOSIS — I700 Atherosclerosis of aorta: Secondary | ICD-10-CM | POA: Diagnosis present

## 2023-11-11 DIAGNOSIS — K2100 Gastro-esophageal reflux disease with esophagitis, without bleeding: Secondary | ICD-10-CM | POA: Diagnosis not present

## 2023-11-11 DIAGNOSIS — N321 Vesicointestinal fistula: Secondary | ICD-10-CM | POA: Diagnosis present

## 2023-11-11 DIAGNOSIS — Z801 Family history of malignant neoplasm of trachea, bronchus and lung: Secondary | ICD-10-CM | POA: Diagnosis not present

## 2023-11-11 DIAGNOSIS — D649 Anemia, unspecified: Secondary | ICD-10-CM | POA: Diagnosis present

## 2023-11-11 DIAGNOSIS — K5720 Diverticulitis of large intestine with perforation and abscess without bleeding: Secondary | ICD-10-CM | POA: Diagnosis present

## 2023-11-11 DIAGNOSIS — B9620 Unspecified Escherichia coli [E. coli] as the cause of diseases classified elsewhere: Secondary | ICD-10-CM | POA: Diagnosis present

## 2023-11-11 DIAGNOSIS — Z87442 Personal history of urinary calculi: Secondary | ICD-10-CM | POA: Diagnosis not present

## 2023-11-11 DIAGNOSIS — I739 Peripheral vascular disease, unspecified: Secondary | ICD-10-CM | POA: Diagnosis present

## 2023-11-11 DIAGNOSIS — Z800 Family history of malignant neoplasm of digestive organs: Secondary | ICD-10-CM | POA: Diagnosis not present

## 2023-11-11 DIAGNOSIS — Z7901 Long term (current) use of anticoagulants: Secondary | ICD-10-CM | POA: Diagnosis not present

## 2023-11-11 DIAGNOSIS — Z87891 Personal history of nicotine dependence: Secondary | ICD-10-CM | POA: Diagnosis not present

## 2023-11-11 DIAGNOSIS — E785 Hyperlipidemia, unspecified: Secondary | ICD-10-CM | POA: Diagnosis present

## 2023-11-11 DIAGNOSIS — E871 Hypo-osmolality and hyponatremia: Secondary | ICD-10-CM | POA: Diagnosis present

## 2023-11-11 DIAGNOSIS — Z803 Family history of malignant neoplasm of breast: Secondary | ICD-10-CM | POA: Diagnosis not present

## 2023-11-11 LAB — COMPREHENSIVE METABOLIC PANEL WITH GFR
ALT: 15 U/L (ref 0–44)
AST: 14 U/L — ABNORMAL LOW (ref 15–41)
Albumin: 2.8 g/dL — ABNORMAL LOW (ref 3.5–5.0)
Alkaline Phosphatase: 57 U/L (ref 38–126)
Anion gap: 7 (ref 5–15)
BUN: 6 mg/dL — ABNORMAL LOW (ref 8–23)
CO2: 23 mmol/L (ref 22–32)
Calcium: 8.4 mg/dL — ABNORMAL LOW (ref 8.9–10.3)
Chloride: 104 mmol/L (ref 98–111)
Creatinine, Ser: 0.89 mg/dL (ref 0.44–1.00)
GFR, Estimated: >60 mL/min (ref >60)
Glucose, Bld: 120 mg/dL — ABNORMAL HIGH (ref 70–99)
Potassium: 3.4 mmol/L — ABNORMAL LOW (ref 3.5–5.1)
Sodium: 134 mmol/L — ABNORMAL LOW (ref 135–145)
Total Bilirubin: 0.6 mg/dL (ref 0.0–1.2)
Total Protein: 6.2 g/dL — ABNORMAL LOW (ref 6.5–8.1)

## 2023-11-11 LAB — CBC WITH DIFFERENTIAL/PLATELET
Abs Immature Granulocytes: 0.25 10*3/uL — ABNORMAL HIGH (ref 0.00–0.07)
Basophils Absolute: 0.1 10*3/uL (ref 0.0–0.1)
Basophils Relative: 1 %
Eosinophils Absolute: 0 10*3/uL (ref 0.0–0.5)
Eosinophils Relative: 0 %
HCT: 39.7 % (ref 36.0–46.0)
Hemoglobin: 12.6 g/dL (ref 12.0–15.0)
Immature Granulocytes: 2 %
Lymphocytes Relative: 21 %
Lymphs Abs: 3.3 10*3/uL (ref 0.7–4.0)
MCH: 29.4 pg (ref 26.0–34.0)
MCHC: 31.7 g/dL (ref 30.0–36.0)
MCV: 92.8 fL (ref 80.0–100.0)
Monocytes Absolute: 1.8 10*3/uL — ABNORMAL HIGH (ref 0.1–1.0)
Monocytes Relative: 12 %
Neutro Abs: 10 10*3/uL — ABNORMAL HIGH (ref 1.7–7.7)
Neutrophils Relative %: 64 %
Platelets: 561 10*3/uL — ABNORMAL HIGH (ref 150–400)
RBC: 4.28 MIL/uL (ref 3.87–5.11)
RDW: 14.5 % (ref 11.5–15.5)
WBC: 15.5 10*3/uL — ABNORMAL HIGH (ref 4.0–10.5)
nRBC: 0 % (ref 0.0–0.2)

## 2023-11-11 LAB — PROTIME-INR
INR: 1 (ref 0.8–1.2)
Prothrombin Time: 13.6 s (ref 11.4–15.2)

## 2023-11-11 LAB — MAGNESIUM: Magnesium: 1.7 mg/dL (ref 1.7–2.4)

## 2023-11-11 MED ORDER — SODIUM CHLORIDE 0.9% FLUSH
5.0000 mL | Freq: Three times a day (TID) | INTRAVENOUS | Status: DC
  Administered 2023-11-11 – 2023-11-18 (×19): 5 mL

## 2023-11-11 MED ORDER — ACETAMINOPHEN 650 MG RE SUPP: 650.0000 mg | Freq: Four times a day (QID) | RECTAL | Status: DC | PRN

## 2023-11-11 MED ORDER — RIVAROXABAN 10 MG PO TABS
20.0000 mg | ORAL_TABLET | Freq: Every day | ORAL | Status: DC
Start: 2023-11-12 — End: 2023-11-11

## 2023-11-11 MED ORDER — ACETAMINOPHEN 325 MG PO TABS
650.0000 mg | ORAL_TABLET | Freq: Four times a day (QID) | ORAL | Status: DC | PRN
  Administered 2023-11-11 – 2023-11-17 (×3): 650 mg via ORAL
  Filled 2023-11-11 (×4): qty 2

## 2023-11-11 MED ORDER — MIDAZOLAM HCL 2 MG/2ML IJ SOLN
INTRAMUSCULAR | Status: AC | PRN
  Administered 2023-11-11: 1 mg via INTRAVENOUS

## 2023-11-11 MED ORDER — AMIODARONE HCL 200 MG PO TABS
200.0000 mg | ORAL_TABLET | Freq: Every day | ORAL | Status: DC
  Administered 2023-11-11 – 2023-11-18 (×8): 200 mg via ORAL
  Filled 2023-11-11 (×8): qty 1

## 2023-11-11 MED ORDER — AMLODIPINE BESYLATE 5 MG PO TABS
5.0000 mg | ORAL_TABLET | Freq: Two times a day (BID) | ORAL | Status: DC
  Filled 2023-11-11: qty 1

## 2023-11-11 MED ORDER — FENTANYL CITRATE (PF) 100 MCG/2ML IJ SOLN
INTRAMUSCULAR | Status: AC | PRN
  Administered 2023-11-11: 50 ug via INTRAVENOUS

## 2023-11-11 MED ORDER — MORPHINE SULFATE (PF) 4 MG/ML IV SOLN
4.0000 mg | INTRAVENOUS | Status: DC | PRN
  Administered 2023-11-11 – 2023-11-14 (×5): 4 mg via INTRAVENOUS
  Filled 2023-11-11 (×5): qty 1

## 2023-11-11 MED ORDER — METOPROLOL SUCCINATE ER 50 MG PO TB24
50.0000 mg | ORAL_TABLET | Freq: Two times a day (BID) | ORAL | Status: DC
  Administered 2023-11-11 – 2023-11-18 (×15): 50 mg via ORAL
  Filled 2023-11-11 (×15): qty 1

## 2023-11-11 MED ORDER — LACTATED RINGERS IV SOLN: INTRAVENOUS | Status: AC

## 2023-11-11 MED ORDER — IOHEXOL 300 MG/ML  SOLN
100.0000 mL | Freq: Once | INTRAMUSCULAR | Status: AC | PRN
Start: 2023-11-11 — End: 2023-11-11
  Administered 2023-11-11: 100 mL via INTRAVENOUS

## 2023-11-11 MED ORDER — MIDAZOLAM HCL 2 MG/2ML IJ SOLN
INTRAMUSCULAR | Status: AC
  Filled 2023-11-11: qty 6

## 2023-11-11 MED ORDER — FENTANYL CITRATE (PF) 100 MCG/2ML IJ SOLN
INTRAMUSCULAR | Status: AC
Start: 2023-11-11 — End: ?
  Filled 2023-11-11: qty 6

## 2023-11-11 MED ORDER — ALBUTEROL SULFATE (2.5 MG/3ML) 0.083% IN NEBU: 2.5000 mg | INHALATION_SOLUTION | RESPIRATORY_TRACT | Status: DC | PRN

## 2023-11-11 MED ORDER — POTASSIUM CHLORIDE CRYS ER 20 MEQ PO TBCR
40.0000 meq | EXTENDED_RELEASE_TABLET | Freq: Once | ORAL | Status: AC
  Administered 2023-11-11: 40 meq via ORAL
  Filled 2023-11-11: qty 2

## 2023-11-11 MED ORDER — PIPERACILLIN-TAZOBACTAM 3.375 G IVPB 30 MIN
3.3750 g | Freq: Once | INTRAVENOUS | Status: AC
  Administered 2023-11-11: 3.375 g via INTRAVENOUS
  Filled 2023-11-11: qty 50

## 2023-11-11 MED ORDER — ATORVASTATIN CALCIUM 20 MG PO TABS
80.0000 mg | ORAL_TABLET | Freq: Every day | ORAL | Status: DC
  Administered 2023-11-11 – 2023-11-18 (×8): 80 mg via ORAL
  Filled 2023-11-11 (×8): qty 4

## 2023-11-11 MED ORDER — NALOXONE HCL 0.4 MG/ML IJ SOLN: 0.4000 mg | INTRAMUSCULAR | Status: DC | PRN

## 2023-11-11 MED ORDER — ONDANSETRON HCL 4 MG/2ML IJ SOLN
4.0000 mg | Freq: Four times a day (QID) | INTRAMUSCULAR | Status: DC | PRN
  Filled 2023-11-11: qty 2

## 2023-11-11 MED ORDER — TRAZODONE HCL 50 MG PO TABS: 25.0000 mg | ORAL_TABLET | Freq: Every evening | ORAL | Status: DC | PRN

## 2023-11-11 MED ORDER — LIDOCAINE HCL 1 % IJ SOLN
INTRAMUSCULAR | Status: AC | PRN
  Administered 2023-11-11: 10 mL via INTRADERMAL

## 2023-11-11 MED ORDER — PIPERACILLIN-TAZOBACTAM 3.375 G IVPB
3.3750 g | Freq: Three times a day (TID) | INTRAVENOUS | Status: DC
  Administered 2023-11-11 – 2023-11-16 (×16): 3.375 g via INTRAVENOUS
  Filled 2023-11-11 (×17): qty 50

## 2023-11-11 NOTE — ED Notes (Signed)
 Patient transported to CT

## 2023-11-11 NOTE — H&P (Signed)
 History and Physical  Olivia [name] AVW:[id number] DOB: 02-13-1956 DOA: 11/10/2023  PCP: Darreld Elms, PA-C   Chief Complaint: Abdominal pain  HPI: Olivia [name] is a 68 y.o. female with medical history significant for longstanding and recurrent diverticulitis with known dense colonic diverticulosis being admitted to the hospital with acute diverticulitis with abscess and colovesicular fistula.  Patient states that she was hospitalized in [place] until 4/14 due to acute diverticulitis and was later diagnosed with UTI and has been on ciprofloxacin .  However despite this she has had continued dysuria, urgency and frequency of urination, along with what feels like air coming out of her bladder.  She also has had continued left lower quadrant abdominal pain, which never completely resolved.  She decided to come here to the emergency department for evaluation since she was told that this is where her surgeon operates.  She has recently met Dr. Dorrie Gaudier of [facility] for outpatient consultation.  She denies any fevers, diarrhea, had some mild nausea but no vomiting.  Review of Systems: Please see HPI for pertinent positives and negatives. A complete 10 system review of systems are otherwise negative.  Past Medical History:  Diagnosis Date   Diverticulitis    Dyslipidemia    PAD (peripheral artery disease) (HCC)    PONV (postoperative nausea and vomiting)    Psoriasis    RA (rheumatoid arthritis) (HCC)    Vertigo    Past Surgical History:  Procedure Laterality Date   ANEURYSM COILING Right    right toe    BILIARY BRUSHING  05/16/2020   Procedure: BILIARY BRUSHING;  Surgeon: Ruby Corporal, MD;  Location: [facility];  Service: Endoscopy;;   BILIARY BRUSHING  07/26/2020   Procedure: BILIARY BRUSHING;  Surgeon: Janel Medford, MD;  Location: [facility];  Service: Endoscopy;;   BILIARY STENT PLACEMENT  01/05/2020   Procedure: BILIARY STENT PLACEMENT;  Surgeon: Ruby Corporal, MD;   [facility];  Service: Endoscopy;;   BILIARY STENT PLACEMENT  05/16/2020   Procedure: BILIARY STENT PLACEMENT;  Surgeon: Ruby Corporal, MD;  Location: [facility];  Service: Endoscopy;;   CHOLECYSTECTOMY     COLONOSCOPY  03/07/2015   Propofol ; Surgeon: Dr. Petel; Three 6 mm rectosigmoid polyps, moderate left-sided diverticulosis, tortuous rectosigmoid colon. Pathology with inflamed hyperplastic polyp.   COLONOSCOPY WITH PROPOFOL  N/A 08/18/2019   non-bleeding internal hemorrhoids, pancolonic diverticulosis, two 4-7 mm polyps in rectum benign, no adenomas. Next colonoscopy in 10 years.    ENDOSCOPIC RETROGRADE CHOLANGIOPANCREATOGRAPHY (ERCP) WITH PROPOFOL  N/A 07/26/2020   Procedure: ENDOSCOPIC RETROGRADE CHOLANGIOPANCREATOGRAPHY (ERCP) WITH PROPOFOL ;  Surgeon: Janel Medford, MD;  Location: [facility];  Service: Endoscopy;  Laterality: N/A;   ERCP N/A 01/05/2020   Procedure: ENDOSCOPIC RETROGRADE CHOLANGIOPANCREATOGRAPHY (ERCP);  Surgeon: Ruby Corporal, MD;  [facility];  Service: Endoscopy;  Laterality: N/A;   ERCP N/A 05/16/2020   Procedure: ENDOSCOPIC RETROGRADE CHOLANGIOPANCREATOGRAPHY (ERCP);  Surgeon: Ruby Corporal, MD;  Location: [facility];  Service: Endoscopy;  Laterality: N/A;   ESOPHAGOGASTRODUODENOSCOPY (EGD) WITH PROPOFOL  N/A 07/26/2020   Procedure: ESOPHAGOGASTRODUODENOSCOPY (EGD) WITH PROPOFOL ;  Surgeon: Janel Medford, MD;  Location: [facility];  Service: Endoscopy;  Laterality: N/A;   GASTROINTESTINAL STENT REMOVAL N/A 05/16/2020   Procedure: STENT REMOVAL;  Surgeon: Ruby Corporal, MD;  Location: [facility];  Service: Endoscopy;  Laterality: N/A;   HYSTERECTOMY ABDOMINAL WITH SALPINGO-OOPHORECTOMY     ILEOSTOMY  2005   per patient for bowel obstruction and peritonitis with anastomosis  INSERTION OF MESH     RLQ   POLYPECTOMY  08/18/2019   Procedure: POLYPECTOMY;  Surgeon: Suzette Espy, MD;  [facility];  Service: Endoscopy;;   REMOVAL  OF STONES N/A 01/05/2020   Procedure: REMOVAL OF STONES;  Surgeon: Ruby Corporal, MD;  [facility];  Service: Endoscopy;  Laterality: N/A;   SPHINCTEROTOMY N/A 01/05/2020   Procedure: SPHINCTEROTOMY;  Surgeon: Ruby Corporal, MD;  [facility];  Service: Endoscopy;  Laterality: N/A;   SPHINCTEROTOMY N/A 05/16/2020   Procedure: SPHINCTEROTOMY;  Surgeon: Ruby Corporal, MD;  Location: [facility];  Service: Endoscopy;  Laterality: N/A;   SPYGLASS CHOLANGIOSCOPY N/A 07/26/2020   Procedure: ZOXWRUEA CHOLANGIOSCOPY;  Surgeon: Janel Medford, MD;  [facility];  Service: Endoscopy;  Laterality: N/A;   STENT REMOVAL  07/26/2020   Procedure: STENT REMOVAL;  Surgeon: Janel Medford, MD;  [facility];  Service: Endoscopy;;   UPPER ESOPHAGEAL ENDOSCOPIC ULTRASOUND (EUS) N/A 07/26/2020   Procedure: UPPER ESOPHAGEAL ENDOSCOPIC ULTRASOUND (EUS);  Surgeon: Janel Medford, MD;  Location: [facility];  Service: Endoscopy;  Laterality: N/A;   Social History:  reports that she quit smoking about 5 years ago. Her smoking use included cigarettes. She started smoking about 55 years ago. She has a 25 pack-year smoking history. She has never used smokeless tobacco. She reports current alcohol use. She reports that she does not use drugs.  Allergies  Allergen Reactions   Codeine     Headache-"draws line across forehead"    Family History  Problem Relation Age of Onset   Lung cancer Mother    Lung cancer Father    Stomach cancer Brother    Breast cancer Cousin    Colon cancer Neg Hx      Prior to Admission medications   Medication Sig Start Date End Date Taking? Authorizing Provider  alendronate (FOSAMAX) 70 MG tablet Take 70 mg by mouth every Monday. Take with a full glass of water  on an empty stomach.    [provider]  amiodarone (PACERONE) 200 MG tablet Take 200 mg by mouth daily.    [provider]  amLODipine (NORVASC) 5 MG tablet Take 5  mg by mouth 2 (two) times daily.    [provider]  atorvastatin (LIPITOR) 80 MG tablet Take 80 mg by mouth daily.    [provider]  calcium -vitamin D (OSCAL WITH D) 500-5 MG-MCG tablet Take 1 tablet by mouth.    [provider]  ciprofloxacin  (CIPRO ) 500 MG tablet Take 1 tablet by mouth every 12 (twelve) hours.    [provider]  Cyanocobalamin (VITAMIN B 12 PO) Place 3,000 mcg under the tongue daily.    [provider]  Cyanocobalamin (VITAMIN B-12 IJ) Inject 1,000 mcg into the muscle daily as needed (low Hemogloben).    [provider]  Dexlansoprazole  (DEXILANT ) 30 MG capsule DR Take 30 mg by mouth daily.    [provider]  dicyclomine  (BENTYL ) 10 MG capsule Take 10 mg by mouth daily as needed for spasms.    [provider]  Docusate Sodium (DSS) 100 MG CAPS Take 1 capsule by mouth 2 (two) times daily.    [provider]  ergocalciferol (VITAMIN D2) 1.25 MG (50000 UT) capsule Take 50,000 Units by mouth every Tuesday.     [provider]  metoprolol succinate (TOPROL-XL) 50 MG 24 hr tablet Take 50 mg by mouth 2 (two) times daily.  12/12/22   [provider]  predniSONE  (DELTASONE ) 10 MG tablet Take 10 mg by mouth daily. 05/12/20   [provider]  SENNA-TIME 8.6 MG tablet Take 2 tablets by mouth as needed for constipation.    [provider]  sulfaSALAzine (AZULFIDINE) 500 MG tablet Take 1,000 mg by mouth 2 (two) times daily. 01/12/23   [provider]  XARELTO 20 MG TABS tablet Take 20 mg by mouth daily. 12/12/22   [provider]    Physical Exam: BP (!) 126/54 (BP Location: Left Arm)   Pulse 71   Temp 98.2 F (36.8 C)   Resp 20   SpO2 97%  General:  Alert, oriented, calm, in no acute distress, she looks quite comfortable and nontoxic.  Her husband is at the bedside. Neck: supple, no masses, trachea mildline  Cardiovascular: RRR, no murmurs or rubs, no  peripheral edema  Respiratory: clear to auscultation bilaterally, no wheezes, no crackles  Abdomen: soft, tender in the left lower quadrant without rebound or guarding, nondistended, normal bowel tones heard  Skin: dry, no rashes  Musculoskeletal: no joint effusions, normal range of motion  Psychiatric: appropriate affect, normal speech  Neurologic: extraocular muscles intact, clear speech, moving all extremities with intact sensorium         Labs on Admission:  Basic Metabolic Panel: Recent Labs  Lab 11/10/23 1939 11/11/23 0512  NA 137 134*  K 3.0* 3.4*  CL 101 104  CO2 25 23  GLUCOSE 108* 120*  BUN 8 6*  CREATININE 1.07* 0.89  CALCIUM  9.2 8.4*  MG  --  1.7   Liver Function Tests: Recent Labs  Lab 11/10/23 1939 11/11/23 0512  AST 18 14*  ALT 18 15  ALKPHOS 66 57  BILITOT 0.6 0.6  PROT 7.5 6.2*  ALBUMIN 3.3* 2.8*   Recent Labs  Lab 11/10/23 1939  LIPASE 33   No results for input(s): "AMMONIA" in the last 168 hours. CBC: Recent Labs  Lab 11/10/23 1939 11/11/23 0512  WBC 21.7* 15.5*  NEUTROABS  --  10.0*  HGB 14.3 12.6  HCT 44.7 39.7  MCV 92.2 92.8  PLT 597* 561*   Cardiac Enzymes: No results for input(s): "CKTOTAL", "CKMB", "CKMBINDEX", "TROPONINI" in the last 168 hours. BNP (last 3 results) No results for input(s): "BNP" in the last 8760 hours.  ProBNP (last 3 results) No results for input(s): "PROBNP" in the last 8760 hours.  CBG: No results for input(s): "GLUCAP" in the last 168 hours.  Radiological Exams on Admission: CT ABDOMEN PELVIS W CONTRAST Result Date: 11/11/2023 CLINICAL DATA:  Left lower quadrant abdominal pain for 1 week. Worsens with bowel movement. History of diverticulitis. EXAM: CT ABDOMEN AND PELVIS WITH CONTRAST TECHNIQUE: Multidetector CT imaging of the abdomen and pelvis was performed using the standard protocol following bolus administration of intravenous contrast. RADIATION DOSE REDUCTION: This exam was performed according to  the departmental dose-optimization program which includes automated exposure control, adjustment of the mA and/or kV according to patient size and/or use of iterative reconstruction technique. CONTRAST:  OMNIPAQUE  IOHEXOL  300 MG/ML  SOLN COMPARISON:  CT abdomen pelvis 11/18/2022 FINDINGS: Lower chest: No acute abnormality. Hepatobiliary: No focal hepatic lesion. Fluid and gas containing structure in the porta hepatis may be a remnant gallbladder stump as this communicates with the biliary tree. This is unchanged from 11/18/2022. Per chart review the patient is status post cholecystectomy. Pneumobilia. Unchanged prominence of the common bile duct measuring 10 mm in diameter. Pancreas: Unremarkable.  Spleen: Unremarkable. Adrenals/Urinary Tract: Normal adrenal glands. Punctate nonobstructing stones bilaterally. No hydronephrosis. Urothelial thickening about the mid left ureter is likely reactive secondary to adjacent diverticulitis. Gas is present within the bladder. Bladder wall thickening in the dome of the bladder. Stomach/Bowel: Stomach is within normal limits. Duodenal diverticulum. No bowel obstruction. Normal appendix. Extensive sigmoid and descending colon diverticulosis. There is marked wall thickening with adjacent inflammatory fat stranding and pericolonic fluid about the sigmoid colon. Peripherally enhancing irregular gas and fluid collection between the inflamed portions of the sigmoid colon in the dome of the bladder measuring 4.4 x 3.3 x 2.4 cm (series 2/image 69 and 7/69). Fluid-filled tracts extend between the sigmoid colon in the bladder. Along with the gas in the bladder this is highly suspicious for colovesical fistula with possible additional colocolic fistula. The gas and fluid collection extends posteriorly to the vaginal cuff (series 9/image 98). Small locule of gas in the vagina. Colovaginal fistula is difficult to exclude. Vascular/Lymphatic: Aortic atherosclerosis. No enlarged abdominal  or pelvic lymph nodes. Reproductive: Hysterectomy. See above for discussion about possible colo vaginal fistula. No adnexal mass. Other: Ventral abdominal wall hernia repair. Musculoskeletal: No acute fracture. IMPRESSION: Complicated sigmoid diverticulitis with 4.4 cm gas containing abscess between the sigmoid colon and bladder. 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 Soft tissue tracts extend between portions of the sigmoid colon as well as to the vaginal cuff suspicious for additional colocolic and colovaginal fistulas. Aortic Atherosclerosis (ICD10-I70.0). Electronically Signed   By: Rozell Cornet M.D.   On: 11/11/2023 01:51   Assessment/Plan Olivia [name] is a 67 y.o. female with medical history significant for longstanding and recurrent diverticulitis with known dense colonic diverticulosis being admitted to the hospital with acute diverticulitis with abscess and colovesicular fistula.   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  Unfortunately she has known dense diverticulosis, has a history of complicated diverticulitis starting about 20 years ago when she had transverse colon perforation requiring colostomy and subsequent takedown.  She was admitted to the hospital at [facility] about 3 weeks ago, with sepsis secondary to diverticulitis.  At that time, she was noted to have no abscess, no free air, improved with antibiotics.  She has seen outpatient gastroenterology last week, and was also seen as an outpatient in the general surgery clinic recently. -Inpatient admission -N.p.o. -Empiric IV Zosyn -Hold anticoagulation -IR consult for percutaneous drain -General Surgery has been consulted  Hypertension-continue home Norvasc, with holding parameters  Paroxysmal atrial fibrillation-continue Toprol-XL, amiodarone.  Will hold Xarelto for now pending the above interventions.  Last dose of Xarelto was  5/5.  Hyperlipidemia-Lipitor  DVT prophylaxis: SCDs only, resume Xarelto when able    Code Status: Full Code  Consults called: [facility] [place] [facility] surgery, interventional radiology.  Admission status: The appropriate patient status for this patient is INPATIENT. Inpatient status is judged to be reasonable and necessary in order to provide the required intensity of service to ensure the patient's safety. The patient's presenting symptoms, physical exam findings, and initial radiographic and laboratory data in the context of their chronic comorbidities is felt to place them at high risk for further clinical deterioration. Furthermore, it is not anticipated that the patient will be medically stable for discharge from the hospital within 2 midnights of admission.    I certify that at the point of admission it is my clinical judgment that the patient will require inpatient hospital care spanning beyond 2 midnights from  the point of admission due to high intensity of service, high risk for further deterioration and high frequency of surveillance required  Time spent: 59 minutes  Azriel Jakob Rickey Charm MD [facility] Pager [phone]  If 7PM-7AM, please contact night-coverage [email or web address] Password [facility]  11/11/2023, 8:17 AM

## 2023-11-11 NOTE — Progress Notes (Signed)
 Mobility Specialist - Progress Note   11/11/23 1251  Mobility  Activity Ambulated independently in hallway  Level of Assistance Independent  Assistive Device None  Distance Ambulated (ft) 600 ft  Activity Response Tolerated well  Mobility Referral Yes  Mobility visit 1 Mobility  Mobility Specialist Start Time (ACUTE ONLY) 1241  Mobility Specialist Stop Time (ACUTE ONLY) 1250  Mobility Specialist Time Calculation (min) (ACUTE ONLY) 9 min   Pt received in bed and agreeable to mobility. No complaints during session. Pt to EOB after session with all needs met.    [facility]

## 2023-11-11 NOTE — Consult Note (Signed)
 Chief Complaint: Left lower abdominal pain, pneumaturia, diverticular abscess with possible colovesical fistula; referred for image guided drainage of diverticular abscess  Referring Provider(s): Howerter,J  Supervising Physician: Alyssa Jumper  Patient Status: [facility] - In-pt  History of Present Illness: Olivia [name] is a 68 y.o. female ,ex-smoker, with past medical history significant for hyperlipidemia, hypertension, paroxysmal A-fib, peripheral arterial disease, psoriasis, history of common bile duct stricture and previous biliary stent placement , cholecystectomy, choledocholithiasis, nephrolithiasis, rheumatoid arthritis, vertigo, longstanding and recurrent diverticulitis with known dense colonic diverticulosis who was recently admitted to [facility] with acute diverticulitis with abscess and possible colovesical fistula.  Patient was recently hospitalized in [place] in  mid April of this year due to acute diverticulitis and later diagnosed with UTI and has been on outpatient ciprofloxacin .  Despite this she continues to have left lower abdominal pain, dysuria, urgency and frequency of urination along with pneumaturia.  Patient also has history of transverse colon perforation approximately 20 years ago secondary to diverticular disease requiring colostomy and subsequent takedown.  Latest CT of the abdomen and pelvis reveals:  Complicated sigmoid diverticulitis with 4.4 cm gas containing abscess between the sigmoid colon and bladder. 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 Soft tissue tracts extend between portions of the sigmoid colon as well as to the vaginal cuff suspicious for additional colocolic and colovaginal fistulas.   Aortic Atherosclerosis  She is currently afebrile, WBC 15.5, hemoglobin normal, platelets 561K, creatinine normal, potassium 3.4, PT/INR normal; on IV Zosyn; last dose of Xarelto was on 5/5  Request now received for image guided  drainage of diverticular abscess     Patient is Full Code  Past Medical History:  Diagnosis Date   Diverticulitis    Dyslipidemia    PAD (peripheral artery disease) (HCC)    PONV (postoperative nausea and vomiting)    Psoriasis    RA (rheumatoid arthritis) (HCC)    Vertigo     Past Surgical History:  Procedure Laterality Date   ANEURYSM COILING Right    right toe    BILIARY BRUSHING  05/16/2020   Procedure: BILIARY BRUSHING;  Surgeon: Ruby Corporal, MD;  Location: [facility];  Service: Endoscopy;;   BILIARY BRUSHING  07/26/2020   Procedure: BILIARY BRUSHING;  Surgeon: Janel Medford, MD;  Location: [facility];  Service: Endoscopy;;   BILIARY STENT PLACEMENT  01/05/2020   Procedure: BILIARY STENT PLACEMENT;  Surgeon: Ruby Corporal, MD;  [facility];  Service: Endoscopy;;   BILIARY STENT PLACEMENT  05/16/2020   Procedure: BILIARY STENT PLACEMENT;  Surgeon: Ruby Corporal, MD;  Location: [facility];  Service: Endoscopy;;   CHOLECYSTECTOMY     COLONOSCOPY  03/07/2015   Propofol ; Surgeon: Dr. Petel; Three 6 mm rectosigmoid polyps, moderate left-sided diverticulosis, tortuous rectosigmoid colon. Pathology with inflamed hyperplastic polyp.   COLONOSCOPY WITH PROPOFOL  N/A 08/18/2019   non-bleeding internal hemorrhoids, pancolonic diverticulosis, two 4-7 mm polyps in rectum benign, no adenomas. Next colonoscopy in 10 years.    ENDOSCOPIC RETROGRADE CHOLANGIOPANCREATOGRAPHY (ERCP) WITH PROPOFOL  N/A 07/26/2020   Procedure: ENDOSCOPIC RETROGRADE CHOLANGIOPANCREATOGRAPHY (ERCP) WITH PROPOFOL ;  Surgeon: Janel Medford, MD;  [facility];  Service: Endoscopy;  Laterality: N/A;   ERCP N/A 01/05/2020   Procedure: ENDOSCOPIC RETROGRADE CHOLANGIOPANCREATOGRAPHY (ERCP);  Surgeon: Ruby Corporal, MD;  [facility];  Service: Endoscopy;  Laterality: N/A;   ERCP N/A 05/16/2020   Procedure: ENDOSCOPIC RETROGRADE CHOLANGIOPANCREATOGRAPHY (ERCP);  Surgeon: Angelica Kemp  U, MD;  Location: [facility];  Service: Endoscopy;  Laterality: N/A;   ESOPHAGOGASTRODUODENOSCOPY (EGD) WITH PROPOFOL  N/A 07/26/2020   Procedure: ESOPHAGOGASTRODUODENOSCOPY (EGD) WITH PROPOFOL ;  Surgeon: Janel Medford, MD;  [facility];  Service: Endoscopy;  Laterality: N/A;   GASTROINTESTINAL STENT REMOVAL N/A 05/16/2020   Procedure: STENT REMOVAL;  Surgeon: Ruby Corporal, MD;  Location: [facility];  Service: Endoscopy;  Laterality: N/A;   HYSTERECTOMY ABDOMINAL WITH SALPINGO-OOPHORECTOMY     ILEOSTOMY  2005   per patient for bowel obstruction and peritonitis with anastomosis   INSERTION OF MESH     RLQ   POLYPECTOMY  08/18/2019   Procedure: POLYPECTOMY;  Surgeon: Suzette Espy, MD;  [facility];  Service: Endoscopy;;   REMOVAL OF STONES N/A 01/05/2020   Procedure: REMOVAL OF STONES;  Surgeon: Ruby Corporal, MD;  [facility];  Service: Endoscopy;  Laterality: N/A;   SPHINCTEROTOMY N/A 01/05/2020   Procedure: SPHINCTEROTOMY;  Surgeon: Ruby Corporal, MD;  [facility];  Service: Endoscopy;  Laterality: N/A;   SPHINCTEROTOMY N/A 05/16/2020   Procedure: SPHINCTEROTOMY;  Surgeon: Ruby Corporal, MD;  [facility]: [facility];  Service: Endoscopy;  Laterality: N/A;   SPYGLASS CHOLANGIOSCOPY N/A 07/26/2020   Procedure: QIONGEXB CHOLANGIOSCOPY;  Surgeon: Janel Medford, MD;  [facility];  Service: Endoscopy;  Laterality: N/A;   STENT REMOVAL  07/26/2020   Procedure: STENT REMOVAL;  Surgeon: Janel Medford, MD;  [facility];  Service: Endoscopy;;   UPPER ESOPHAGEAL ENDOSCOPIC ULTRASOUND (EUS) N/A 07/26/2020   Procedure: UPPER ESOPHAGEAL ENDOSCOPIC ULTRASOUND (EUS);  Surgeon: Janel Medford, MD;  Location: [facility];  Service: Endoscopy;  Laterality: N/A;    Allergies: Codeine  Medications: Prior to Admission medications   Medication Sig Start Date End Date Taking? Authorizing Provider  alendronate (FOSAMAX) 70 MG  tablet Take 70 mg by mouth every Monday. Take with a full glass of water  on an empty stomach.    [provider]  amiodarone (PACERONE) 200 MG tablet Take 200 mg by mouth daily.    [provider]  amLODipine (NORVASC) 5 MG tablet Take 5 mg by mouth 2 (two) times daily.    [provider]  atorvastatin (LIPITOR) 80 MG tablet Take 80 mg by mouth daily.    [provider]  calcium -vitamin D (OSCAL WITH D) 500-5 MG-MCG tablet Take 1 tablet by mouth.    [provider]  ciprofloxacin  (CIPRO ) 500 MG tablet Take 1 tablet by mouth every 12 (twelve) hours.    [provider]  Cyanocobalamin (VITAMIN B 12 PO) Place 3,000 mcg under the tongue daily.    [provider]  Cyanocobalamin (VITAMIN B-12 IJ) Inject 1,000 mcg into the muscle daily as needed (low Hemogloben).    [provider]  Dexlansoprazole  (DEXILANT ) 30 MG capsule DR Take 30 mg by mouth daily.    [provider]  dicyclomine  (BENTYL ) 10 MG capsule Take 10 mg by mouth daily as needed for spasms.    [provider]  Docusate Sodium (DSS) 100 MG CAPS Take 1 capsule by mouth 2 (two) times daily.    [provider]  ergocalciferol (VITAMIN D2) 1.25 MG (50000 UT) capsule Take 50,000 Units by mouth every Tuesday.     [provider]  metoprolol succinate (TOPROL-XL) 50 MG 24 hr tablet Take 50 mg by mouth 2 (two) times daily. 12/12/22   [provider]  predniSONE  (DELTASONE ) 10 MG  tablet Take 10 mg by mouth daily. 05/12/20   [provider]  SENNA-TIME 8.6 MG tablet Take 2 tablets by mouth as needed for constipation.    [provider]  sulfaSALAzine (AZULFIDINE) 500 MG tablet Take 1,000 mg by mouth 2 (two) times daily. 01/12/23   [provider]  XARELTO 20 MG TABS tablet Take 20 mg by mouth daily. 12/12/22   [provider]     Family History  Problem Relation Age of Onset   Lung cancer Mother    Lung  cancer Father    Stomach cancer Brother    Breast cancer Cousin    Colon cancer Neg Hx     Social History   Socioeconomic History   Marital status: Married    Spouse name: Not on file   Number of children: Not on file   Years of education: Not on file   Highest education level: Not on file  Occupational History   Not on file  Tobacco Use   Smoking status: Former    Current packs/day: 0.00    Average packs/day: 0.5 packs/day for 50.0 years (25.0 ttl pk-yrs)    Types: Cigarettes    Start date: 08/14/1968    Quit date: 08/14/2018    Years since quitting: 5.2   Smokeless tobacco: Never  Vaping Use   Vaping status: Never Used  Substance and Sexual Activity   Alcohol use: Yes    Comment: "not much" per pt as of 07/03/23   Drug use: Never   Sexual activity: Yes    Birth control/protection: Post-menopausal, Surgical  Other Topics Concern   Not on file  Social History Narrative   Not on file   Social Drivers of Health   Financial Resource Strain: Not on file  Food Insecurity: No Food Insecurity (11/11/2023)   Hunger Vital Sign    Worried About Running Out of Food in the Last Year: Never true    Ran Out of Food in the Last Year: Never true  Transportation Needs: No Transportation Needs (11/11/2023)   PRAPARE - Administrator, Civil Service (Medical): No    Lack of Transportation (Non-Medical): No  Physical Activity: Not on file  Stress: Not on file  Social Connections: Socially Integrated (11/11/2023)   Social Connection and Isolation Panel [NHANES]    Frequency of Communication with Friends and Family: Three times a week    Frequency of Social Gatherings with Friends and Family: Once a week    Attends Religious Services: 1 to 4 times per year    Active Member of [employer] or Organizations: Yes    Attends Banker Meetings: 1 to 4 times per year    Marital Status: Married       Review of Systems currently denies fever, headache, chest pain, dyspnea,  cough, back pain, nausea, vomiting or bleeding; cont to have left lower quadrant discomfort along with pneumaturia  Vital Signs: BP (!) 115/59 (BP Location: Left Arm) Comment: notify to the nurse.  Pulse 70   Temp 98 F (36.7 C) (Oral)   Resp 18   SpO2 99%   Advance Care Plan: No documents on file  Physical Exam awake, alert.  Chest clear to auscultation bilaterally.  Heart with regular rate and rhythm.  Abdomen soft, positive bowel sounds, mild to moderate left lower quadrant tenderness to palpation.  No lower extremity edema  Imaging: CT ABDOMEN PELVIS W CONTRAST Result Date: 11/11/2023 CLINICAL DATA:  Left lower quadrant abdominal  pain for 1 week. Worsens with bowel movement. History of diverticulitis. EXAM: CT ABDOMEN AND PELVIS WITH CONTRAST TECHNIQUE: Multidetector CT imaging of the abdomen and pelvis was performed using the standard protocol following bolus administration of intravenous contrast. RADIATION DOSE REDUCTION: This exam was performed according to the departmental dose-optimization program which includes automated exposure control, adjustment of the mA and/or kV according to patient size and/or use of iterative reconstruction technique. CONTRAST:  OMNIPAQUE  IOHEXOL  300 MG/ML  SOLN COMPARISON:  CT abdomen pelvis 11/18/2022 FINDINGS: Lower chest: No acute abnormality. Hepatobiliary: No focal hepatic lesion. Fluid and gas containing structure in the porta hepatis may be a remnant gallbladder stump as this communicates with the biliary tree. This is unchanged from 11/18/2022. Per chart review the patient is status post cholecystectomy. Pneumobilia. Unchanged prominence of the common bile duct measuring 10 mm in diameter. Pancreas: Unremarkable. Spleen: Unremarkable. Adrenals/Urinary Tract: Normal adrenal glands. Punctate nonobstructing stones bilaterally. No hydronephrosis. Urothelial thickening about the mid left ureter is likely reactive secondary to adjacent diverticulitis. Gas  is present within the bladder. Bladder wall thickening in the dome of the bladder. Stomach/Bowel: Stomach is within normal limits. Duodenal diverticulum. No bowel obstruction. Normal appendix. Extensive sigmoid and descending colon diverticulosis. There is marked wall thickening with adjacent inflammatory fat stranding and pericolonic fluid about the sigmoid colon. Peripherally enhancing irregular gas and fluid collection between the inflamed portions of the sigmoid colon in the dome of the bladder measuring 4.4 x 3.3 x 2.4 cm (series 2/image 69 and 7/69). Fluid-filled tracts extend between the sigmoid colon in the bladder. Along with the gas in the bladder this is highly suspicious for colovesical fistula with possible additional colocolic fistula. The gas and fluid collection extends posteriorly to the vaginal cuff (series 9/image 98). Small locule of gas in the vagina. Colovaginal fistula is difficult to exclude. Vascular/Lymphatic: Aortic atherosclerosis. No enlarged abdominal or pelvic lymph nodes. Reproductive: Hysterectomy. See above for discussion about possible colo vaginal fistula. No adnexal mass. Other: Ventral abdominal wall hernia repair. Musculoskeletal: No acute fracture. IMPRESSION: Complicated sigmoid diverticulitis with 4.4 cm gas containing abscess between the sigmoid colon and bladder. 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 Soft tissue tracts extend between portions of the sigmoid colon as well as to the vaginal cuff suspicious for additional colocolic and colovaginal fistulas. Aortic Atherosclerosis (ICD10-I70.0). Electronically Signed   By: Rozell Cornet M.D.   On: 11/11/2023 01:51    Labs:  CBC: Recent Labs    11/18/22 0919 07/03/23 1416 11/10/23 1939 11/11/23 0512  WBC 13.7* 14.8* 21.7* 15.5*  HGB 14.1 13.9 14.3 12.6  HCT 44.7 43.9 44.7 39.7  PLT 596* 569* 597* 561*    COAGS: Recent Labs    11/11/23 0512  INR 1.0    BMP: Recent Labs     11/18/22 0919 07/03/23 1416 11/10/23 1939 11/11/23 0512  NA 139 135 137 134*  K 3.6 3.4* 3.0* 3.4*  CL 104 103 101 104  CO2 25 24 25 23   GLUCOSE 102* 110* 108* 120*  BUN 12 10 8  6*  CALCIUM  8.5* 8.8* 9.2 8.4*  CREATININE 0.77 0.77 1.07* 0.89  GFRNONAA >60 >60 56* >60    LIVER FUNCTION TESTS: Recent Labs    11/18/22 0919 07/03/23 1416 11/10/23 1939 11/11/23 0512  BILITOT 0.8 0.6 0.6 0.6  AST 23 18 18  14*  ALT 29 21 18 15   ALKPHOS 75 65 66 57  PROT 6.8 6.8 7.5 6.2*  ALBUMIN  3.3* 3.4* 3.3* 2.8*    TUMOR MARKERS: No results for input(s): "AFPTM", "CEA", "CA199", "CHROMGRNA" in the last 8760 hours.  Assessment and Plan: 68 y.o. female ,ex-smoker, with past medical history significant for hyperlipidemia, hypertension, paroxysmal A-fib, peripheral arterial disease, psoriasis, history of common bile duct stricture and previous biliary stent placement , cholecystectomy, choledocholithiasis, nephrolithiasis, rheumatoid arthritis, vertigo, longstanding and recurrent diverticulitis with known dense colonic diverticulosis who was recently admitted to [facility] with acute diverticulitis with abscess and possible colovesical fistula.  Patient was recently hospitalized in [place] in  mid April of this year due to acute diverticulitis and later diagnosed with UTI and has been on outpatient ciprofloxacin .  Despite this she continues to have left lower abdominal pain, dysuria, urgency and frequency of urination along with pneumaturia.  Patient also has history of transverse colon perforation approximately 20 years ago secondary to diverticular disease requiring colostomy and subsequent takedown.  Latest CT of the abdomen and pelvis reveals:  Complicated sigmoid diverticulitis with 4.4 cm gas containing abscess between the sigmoid colon and bladder. 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 Soft tissue tracts extend between portions of the sigmoid colon as well as to  the vaginal cuff suspicious for additional colocolic and colovaginal fistulas.   Aortic Atherosclerosis  She is currently afebrile, WBC 15.5, hemoglobin normal, platelets 561K, creatinine normal, potassium 3.4, PT/INR normal; on IV Zosyn; last dose of Xarelto was on 5/5  Request now received for image guided drainage of diverticular abscess; latest imaging studies were reviewed by Dr. Lovell Rubenstein.Risks and benefits discussed with the patient including bleeding, infection, damage to adjacent structures, bowel perforation/fistula connection, and sepsis.  All of the patient's questions were answered, patient is agreeable to proceed. Consent signed and in chart.  Procedure TENTATIVELY scheduled for later today   Thank you for allowing our service to participate in Olivia [name] 's care.  Electronically Signed: D. Honore Lux, PA-C   11/11/2023, 10:45 AM      I spent a total of 40 Minutes    in face to face in clinical consultation, greater than 50% of which was counseling/coordinating care for image guided drainage of diverticular abscess

## 2023-11-11 NOTE — Progress Notes (Addendum)
  Carryover admission to the Day Admitter.  I discussed this case with the EDP, Dorisann Garre, PA.  Per these discussions:   This is a 68 year old female with recurrent urinary tract infections, who is being admitted with acute diverticulitis complicated by abscess and potential colovesicular fistula after presenting with 1 week of left lower quadrant abdominal discomfort.  She was recently diagnosed with a urinary tract infection and has been on ciprofloxacin .  Afebrile.  CBC shows white blood cell count 21,700.  Urinalysis is reported to continue to appear infected in spite of outpatient ciprofloxacin .  CT abdomen/pelvis today shows evidence of acute diverticulitis complicated by abscess and potential colovesicular fistula.  EDP has sent message to on-call general surgery group requesting consult in the AM.   She has received a dose of Zosyn.  I have placed an order for inpatient admission to med/tele for further evaluation and management of the above.  I have placed some additional preliminary admit orders via the adult multi-morbid admission order set. I have also ordered n.p.o., prn Zofran , as needed IV morphine, continuous lactated Ringer 's at 21 cc/h x 12 hours, incentive spirometry, and have continued the Zosyn started in the emergency department this evening.  I have ordered morning labs in the form of CMP, CBC, magnesium level as well as INR.  I have also placed order for IR consult in the morning in the setting of diverticular abscess.    Camelia Cavalier, DO Hospitalist

## 2023-11-11 NOTE — TOC Initial Note (Signed)
 Transition of Care [facility]) - Initial/Assessment Note    Patient Details  Name: Olivia [name] MRN: [id number] Date of Birth: 16-Dec-1955  Transition of Care [facility]) CM/SW Contact:    Bari Leys, RN Phone Number: 11/11/2023, 1:07 PM  Clinical Narrative:  Met with patient at bedside to introduce role of TOC/NCM and review for dc planning, patient reports she has an established PCP and pharmacy, no current home care services or home DME, patient reports she resides with her spouse with good support and support from her children, Family to provide transport at discharge. TOC will continue to follow.                  Expected Discharge Plan: Home w Home Health Services     Patient Goals and CMS Choice Patient states their goals for this hospitalization and ongoing recovery are:: return home          Expected Discharge Plan and Services       Living arrangements for the past 2 months: Single Family Home                                      Prior Living Arrangements/Services Living arrangements for the past 2 months: Single Family Home Lives with:: Spouse Patient language and need for interpreter reviewed:: Yes Do you feel safe going back to the place where you live?: Yes      Need for Family Participation in Patient Care: Yes (Comment) Care giver support system in place?: Yes (comment)   Criminal Activity/Legal Involvement Pertinent to Current Situation/Hospitalization: No - Comment as needed  Activities of Daily Living   ADL Screening (condition at time of admission) Independently performs ADLs?: Yes (appropriate for developmental age) Is the patient deaf or have difficulty hearing?: No Does the patient have difficulty seeing, even when wearing glasses/contacts?: No Does the patient have difficulty concentrating, remembering, or making decisions?: No  Permission Sought/Granted                  Emotional Assessment Appearance:: Appears stated  age Attitude/Demeanor/Rapport: Engaged Affect (typically observed): Accepting Orientation: : Oriented to Self, Oriented to Place, Oriented to  Time, Oriented to Situation Alcohol / Substance Use: Not Applicable Psych Involvement: No (comment)  Admission diagnosis:  Diverticulitis [K57.92] Colonic diverticular abscess [K57.20] Acute diverticulitis [K57.92] Patient Active Problem List   Diagnosis Date Noted   Acute diverticulitis 11/11/2023   Diverticulitis 09/18/2022   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   AVM (arteriovenous malformation) of small bowel, 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   Chronic gastritis 06/27/2022   Melena    GI bleed 05/25/2020   Leukocytosis 05/25/2020   Thrombocytosis 05/25/2020   Normocytic anemia 05/25/2020   GERD (gastroesophageal reflux disease) 05/25/2020   Dyslipidemia 05/25/2020   Vitamin D deficiency 05/25/2020   Choledocholithiasis 12/26/2019   RUQ pain 09/27/2019   Neck mass 09/27/2019   Elevated LFTs 05/02/2019   Fatty liver 05/02/2019   Rectal bleeding 05/02/2019   Peripheral arterial disease (HCC) 11/14/2013   Palpitations 05/20/2011   Tobacco abuse disorder 05/20/2011   Panic disorder without agoraphobia 07/13/2007   PCP:  Darreld Elms, PA-C Pharmacy:   [address] [place], [place] - [place], [place] - [address] [place] [address] Phone: [phone] Fax: [phone]  [facility] - [place], [place] - [address] Portal to Registered [facility] [address]  [place] [address] Phone: [phone] Fax: [phone]     Social Drivers of Health (SDOH) Social History: SDOH Screenings   Food Insecurity: No Food Insecurity (11/11/2023)  Housing: Low Risk  (11/11/2023)  Transportation Needs: No Transportation Needs (11/11/2023)  Utilities: Not At Risk (11/11/2023)  Social Connections: Socially Integrated (11/11/2023)  Tobacco Use: Medium Risk (11/10/2023)   SDOH  Interventions:     Readmission Risk Interventions    11/11/2023    1:06 PM  Readmission Risk Prevention Plan  Post Dischage Appt Complete  Medication Screening Complete  Transportation Screening Complete

## 2023-11-11 NOTE — Consult Note (Signed)
 Olivia [name] 12/05/1955  [id number].    Requesting MD: Dr. Pincus Bridgeman Chief Complaint/Reason for Consult: Diverticulitis   HPI: Olivia [name] is a 68 y.o. female who presented to the ED for abdominal pain.  Patient reports she has been dealing with LLQ abdominal pain since beginning of April.  She reports she was admitted to the hospital in [facility] for diverticulitis episode and treated with antibiotics and has had issues with ongoing pain and constipation since discharge. She was seen in the ED in [place] for hematuria and treated with Cipro  at the end of April.  She saw Dr. Dorrie Gaudier in the office on 5/2 for discussion of elective colectomy.  Over the last 4 days however her LLQ abdominal pain has progressively worsened with associated chills, pneumaturia and a mucousy/pink vaginal discharge.  She denies any flecks of stool in her urine or stool like vaginal discharge.  No fever, nausea, vomiting.  She is still moving her bowels with the last BM at 7 PM yesterday.  No hematochezia.  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  She is currently hemodynamically stable without fever, tachycardia or systolic hypotension.  WBC 15.5 from 21.7.  She was admitted to [facility] and started on antibiotics.  General surgery was asked to consult.  Her past surgical history is significant for tubal ligation, hysterectomy. Also hx of colon resection with creation of an ileostomy for what she describes as perforated diverticulitis. Had ileostomy reversed ~9 months later.   She reports her last colonoscopy was in 2023 at [facility] with 2 polyps removed that were benign and otherwise showed diverticulosis. Per GI notes, this also showed severe diverticulosis noted they also noted ulcers in her TI with biopsies revealing mild ileitis with no granulomas - IBD panel came back  negative. I can see colonoscopy report therefor information was taken pt and chart review.   She denies any family history of colon cancer but does report 2 family members with history of stomach cancer.  She also reports family history of UC on her mother side.   Patient does take 10 mg prednisone  daily for history of RA.  She is also on Xarelto for PAF LD (5/5 PM).   Prior smoker, 10 pack year hx, quit in 2012.   ROS: ROS As above, see hpi  Family History  Problem Relation Age of Onset   Lung cancer Mother    Lung cancer Father    Stomach cancer Brother    Breast cancer Cousin    Colon cancer Neg Hx     Past Medical History:  Diagnosis Date   Diverticulitis    Dyslipidemia    PAD (peripheral artery disease) (HCC)    PONV (postoperative nausea and vomiting)    Psoriasis    RA (rheumatoid arthritis) (HCC)    Vertigo     Past Surgical History:  Procedure Laterality Date   ANEURYSM COILING Right    right toe    BILIARY BRUSHING  05/16/2020   Procedure: BILIARY BRUSHING;  Surgeon: Ruby Corporal, MD;  Location: [facility];  Service: Endoscopy;;   BILIARY BRUSHING  07/26/2020   Procedure: BILIARY BRUSHING;  Surgeon: Janel Medford, MD;  Location: [facility];  Service: Endoscopy;;   BILIARY STENT PLACEMENT  01/05/2020   Procedure: BILIARY STENT PLACEMENT;  Surgeon: Ruby Corporal, MD;  [facility];  Service: Endoscopy;;   BILIARY STENT PLACEMENT  05/16/2020   Procedure: BILIARY STENT PLACEMENT;  Surgeon: Ruby Corporal, MD;  Location: [facility];  Service: Endoscopy;;   CHOLECYSTECTOMY     COLONOSCOPY  03/07/2015   Propofol ; Surgeon: Dr. Petel; Three 6 mm rectosigmoid polyps, moderate left-sided diverticulosis, tortuous rectosigmoid colon. Pathology with inflamed hyperplastic polyp.   COLONOSCOPY WITH PROPOFOL  N/A 08/18/2019   non-bleeding internal hemorrhoids, pancolonic diverticulosis, two 4-7 mm polyps in rectum benign, no adenomas. Next colonoscopy in 10  years.    ENDOSCOPIC RETROGRADE CHOLANGIOPANCREATOGRAPHY (ERCP) WITH PROPOFOL  N/A 07/26/2020   Procedure: ENDOSCOPIC RETROGRADE CHOLANGIOPANCREATOGRAPHY (ERCP) WITH PROPOFOL ;  Surgeon: Janel Medford, MD;  [facility];  Service: Endoscopy;  Laterality: N/A;   ERCP N/A 01/05/2020   Procedure: ENDOSCOPIC RETROGRADE CHOLANGIOPANCREATOGRAPHY (ERCP);  Surgeon: Ruby Corporal, MD;  [facility];  Service: Endoscopy;  Laterality: N/A;   ERCP N/A 05/16/2020   Procedure: ENDOSCOPIC RETROGRADE CHOLANGIOPANCREATOGRAPHY (ERCP);  Surgeon: Ruby Corporal, MD;  Location: [facility];  Service: Endoscopy;  Laterality: N/A;   ESOPHAGOGASTRODUODENOSCOPY (EGD) WITH PROPOFOL  N/A 07/26/2020   Procedure: ESOPHAGOGASTRODUODENOSCOPY (EGD) WITH PROPOFOL ;  Surgeon: Janel Medford, MD;  [facility];  Service: Endoscopy;  Laterality: N/A;   GASTROINTESTINAL STENT REMOVAL N/A 05/16/2020   Procedure: STENT REMOVAL;  Surgeon: Ruby Corporal, MD;  [facility];  Service: Endoscopy;  Laterality: N/A;   HYSTERECTOMY ABDOMINAL WITH SALPINGO-OOPHORECTOMY     ILEOSTOMY  2005   per patient for bowel obstruction and peritonitis with anastomosis   INSERTION OF MESH     RLQ   POLYPECTOMY  08/18/2019   Procedure: POLYPECTOMY;  Surgeon: Suzette Espy, MD;  [facility];  Service: Endoscopy;;   REMOVAL OF STONES N/A 01/05/2020   Procedure: REMOVAL OF STONES;  Surgeon: Ruby Corporal, MD;  [facility];  Service: Endoscopy;  Laterality: N/A;   SPHINCTEROTOMY N/A 01/05/2020   Procedure: SPHINCTEROTOMY;  Surgeon: Ruby Corporal, MD;  [facility];  Service: Endoscopy;  Laterality: N/A;   SPHINCTEROTOMY N/A 05/16/2020   Procedure: SPHINCTEROTOMY;  Surgeon: Ruby Corporal, MD;  [facility];  Service: Endoscopy;  Laterality: N/A;   SPYGLASS CHOLANGIOSCOPY N/A 07/26/2020   Procedure: VHQIONGE CHOLANGIOSCOPY;  Surgeon: Janel Medford, MD;  [facility];   Service: Endoscopy;  Laterality: N/A;   STENT REMOVAL  07/26/2020   Procedure: STENT REMOVAL;  Surgeon: Janel Medford, MD;  [facility];  Service: Endoscopy;;   UPPER ESOPHAGEAL ENDOSCOPIC ULTRASOUND (EUS) N/A 07/26/2020   Procedure: UPPER ESOPHAGEAL ENDOSCOPIC ULTRASOUND (EUS);  Surgeon: Janel Medford, MD;  Location: [facility];  Service: Endoscopy;  Laterality: N/A;    Social History:  reports that she quit smoking about 5 years ago. Her smoking use included cigarettes. She started smoking about 55 years ago. She has a 25 pack-year smoking history. She has never used smokeless tobacco. She reports current alcohol use. She reports that she does not use drugs.  Allergies:  Allergies  Allergen Reactions   Codeine     Headache-"draws line across forehead"    Medications Prior to Admission  Medication Sig Dispense Refill   alendronate (FOSAMAX) 70 MG tablet Take 70 mg by mouth every Monday. Take with a full glass of water  on an empty stomach.     amiodarone (PACERONE) 200 MG tablet Take 200 mg by mouth daily.     amLODipine (NORVASC) 5 MG tablet Take 5 mg by  mouth 2 (two) times daily.     atorvastatin (LIPITOR) 80 MG tablet Take 80 mg by mouth daily.     calcium -vitamin D (OSCAL WITH D) 500-5 MG-MCG tablet Take 1 tablet by mouth.     ciprofloxacin  (CIPRO ) 500 MG tablet Take 1 tablet by mouth every 12 (twelve) hours.     Cyanocobalamin (VITAMIN B 12 PO) Place 3,000 mcg under the tongue daily.     Cyanocobalamin (VITAMIN B-12 IJ) Inject 1,000 mcg into the muscle daily as needed (low Hemogloben).     Dexlansoprazole  (DEXILANT ) 30 MG capsule DR Take 30 mg by mouth daily.     dicyclomine  (BENTYL ) 10 MG capsule Take 10 mg by mouth daily as needed for spasms.     Docusate Sodium (DSS) 100 MG CAPS Take 1 capsule by mouth 2 (two) times daily.     ergocalciferol (VITAMIN D2) 1.25 MG (50000 UT) capsule Take 50,000 Units by mouth every Tuesday.      metoprolol succinate (TOPROL-XL) 50  MG 24 hr tablet Take 50 mg by mouth 2 (two) times daily.     predniSONE  (DELTASONE ) 10 MG tablet Take 10 mg by mouth daily.     SENNA-TIME 8.6 MG tablet Take 2 tablets by mouth as needed for constipation.     sulfaSALAzine (AZULFIDINE) 500 MG tablet Take 1,000 mg by mouth 2 (two) times daily.     XARELTO 20 MG TABS tablet Take 20 mg by mouth daily.       Physical Exam: Blood pressure (!) 115/59, pulse 70, temperature 98.2 F (36.8 C), resp. rate 18, SpO2 99%. General: pleasant, WD/WN female who is laying in bed in NAD HEENT: head is normocephalic, atraumatic.  Sclera are non-icteric.  Heart: regular, rate, and rhythm.   Lungs: CTAB, no wheezes, rhonchi, or rales noted.  Respiratory effort nonlabored Abd:  Soft, ND, LLQ ttp without rigidity or guarding, +BS. No masses, hernias, or organomegaly MS: no BUE or BLE edema Skin: warm and dry  Psych: A&Ox4 with an appropriate affect Neuro: normal speech, thought process intact, gait not assessed  Results for orders placed or performed during the hospital encounter of 11/10/23 (from the past 48 hours)  Urinalysis, Routine w reflex microscopic -Urine, Clean Catch     Status: Abnormal   Collection Time: 11/10/23  7:28 PM  Result Value Ref Range   Color, Urine YELLOW YELLOW   APPearance TURBID (A) CLEAR   Specific Gravity, Urine 1.017 1.005 - 1.030   pH 6.0 5.0 - 8.0   Glucose, UA NEGATIVE NEGATIVE mg/dL   Hgb urine dipstick SMALL (A) NEGATIVE   Bilirubin Urine NEGATIVE NEGATIVE   Ketones, ur NEGATIVE NEGATIVE mg/dL   Protein, ur 161 (A) NEGATIVE mg/dL   Nitrite NEGATIVE NEGATIVE   Leukocytes,Ua MODERATE (A) NEGATIVE   RBC / HPF 21-50 0 - 5 RBC/hpf   WBC, UA >50 0 - 5 WBC/hpf   Bacteria, UA NONE SEEN NONE SEEN   Squamous Epithelial / HPF 6-10 0 - 5 /HPF   WBC Clumps PRESENT    Mucus PRESENT     Comment: Performed at [facility], [address], [place], [place] [address]  Lipase, blood     Status: None   Collection  Time: 11/10/23  7:39 PM  Result Value Ref Range   Lipase 33 11 - 51 U/L    Comment: Performed at [facility], [address] [place], [place] [address]  Comprehensive metabolic panel  Status: Abnormal   Collection Time: 11/10/23  7:39 PM  Result Value Ref Range   Sodium 137 135 - 145 mmol/L   Potassium 3.0 (L) 3.5 - 5.1 mmol/L   Chloride 101 98 - 111 mmol/L   CO2 25 22 - 32 mmol/L   Glucose, Bld 108 (H) 70 - 99 mg/dL    Comment: Glucose reference range applies only to samples taken after fasting for at least 8 hours.   BUN 8 8 - 23 mg/dL   Creatinine, Ser 2.84 (H) 0.44 - 1.00 mg/dL   Calcium  9.2 8.9 - 10.3 mg/dL   Total Protein 7.5 6.5 - 8.1 g/dL   Albumin 3.3 (L) 3.5 - 5.0 g/dL   AST 18 15 - 41 U/L   ALT 18 0 - 44 U/L   Alkaline Phosphatase 66 38 - 126 U/L   Total Bilirubin 0.6 0.0 - 1.2 mg/dL   GFR, Estimated 56 (L) >60 mL/min    Comment: (NOTE) Calculated using the CKD-EPI Creatinine Equation (2021)    Anion gap 11 5 - 15    Comment: Performed at [facility], [address], [place], [place] [address]  CBC     Status: Abnormal   Collection Time: 11/10/23  7:39 PM  Result Value Ref Range   WBC 21.7 (H) 4.0 - 10.5 K/uL   RBC 4.85 3.87 - 5.11 MIL/uL   Hemoglobin 14.3 12.0 - 15.0 g/dL   HCT 01.0 27.2 - 53.6 %   MCV 92.2 80.0 - 100.0 fL   MCH 29.5 26.0 - 34.0 pg   MCHC 32.0 30.0 - 36.0 g/dL   RDW 64.4 03.4 - 74.2 %   Platelets 597 (H) 150 - 400 K/uL   nRBC 0.0 0.0 - 0.2 %    Comment: Performed at [facility], [address], [place], [place] [address]  CBC with Differential/Platelet     Status: Abnormal   Collection Time: 11/11/23  5:12 AM  Result Value Ref Range   WBC 15.5 (H) 4.0 - 10.5 K/uL   RBC 4.28 3.87 - 5.11 MIL/uL   Hemoglobin 12.6 12.0 - 15.0 g/dL   HCT 87.5 64.3 - 32.9 %   MCV 92.8 80.0 - 100.0 fL   MCH 29.4 26.0 - 34.0 pg   MCHC 31.7 30.0 - 36.0 g/dL   RDW 51.8 84.1 - 66.0 %   Platelets 561  (H) 150 - 400 K/uL   nRBC 0.0 0.0 - 0.2 %   Neutrophils Relative % 64 %   Neutro Abs 10.0 (H) 1.7 - 7.7 K/uL   Lymphocytes Relative 21 %   Lymphs Abs 3.3 0.7 - 4.0 K/uL   Monocytes Relative 12 %   Monocytes Absolute 1.8 (H) 0.1 - 1.0 K/uL   Eosinophils Relative 0 %   Eosinophils Absolute 0.0 0.0 - 0.5 K/uL   Basophils Relative 1 %   Basophils Absolute 0.1 0.0 - 0.1 K/uL   Immature Granulocytes 2 %   Abs Immature Granulocytes 0.25 (H) 0.00 - 0.07 K/uL    Comment: Performed at [facility], [address], [place], [place] [address]  Comprehensive metabolic panel with GFR     Status: Abnormal   Collection Time: 11/11/23  5:12 AM  Result Value Ref Range   Sodium 134 (L) 135 - 145 mmol/L   Potassium 3.4 (L) 3.5 - 5.1 mmol/L   Chloride 104 98 - 111 mmol/L   CO2 23 22 - 32 mmol/L  Glucose, Bld 120 (H) 70 - 99 mg/dL    Comment: Glucose reference range applies only to samples taken after fasting for at least 8 hours.   BUN 6 (L) 8 - 23 mg/dL   Creatinine, Ser 7.82 0.44 - 1.00 mg/dL   Calcium  8.4 (L) 8.9 - 10.3 mg/dL   Total Protein 6.2 (L) 6.5 - 8.1 g/dL   Albumin 2.8 (L) 3.5 - 5.0 g/dL   AST 14 (L) 15 - 41 U/L   ALT 15 0 - 44 U/L   Alkaline Phosphatase 57 38 - 126 U/L   Total Bilirubin 0.6 0.0 - 1.2 mg/dL   GFR, Estimated >95 >62 mL/min    Comment: (NOTE) Calculated using the CKD-EPI Creatinine Equation (2021)    Anion gap 7 5 - 15    Comment: Performed at [facility], [address] [place], [place] [address]  Magnesium     Status: None   Collection Time: 11/11/23  5:12 AM  Result Value Ref Range   Magnesium 1.7 1.7 - 2.4 mg/dL    Comment: Performed at [facility], [address] [place], [place] [address]  Protime-INR     Status: None   Collection Time: 11/11/23  5:12 AM  Result Value Ref Range   Prothrombin Time 13.6 11.4 - 15.2 seconds   INR 1.0 0.8 - 1.2    Comment: (NOTE) INR goal varies based on device and  disease states. Performed at [facility], [address], [place], [place] [address]    CT ABDOMEN PELVIS W CONTRAST Result Date: 11/11/2023 CLINICAL DATA:  Left lower quadrant abdominal pain for 1 week. Worsens with bowel movement. History of diverticulitis. EXAM: CT ABDOMEN AND PELVIS WITH CONTRAST TECHNIQUE: Multidetector CT imaging of the abdomen and pelvis was performed using the standard protocol following bolus administration of intravenous contrast. RADIATION DOSE REDUCTION: This exam was performed according to the departmental dose-optimization program which includes automated exposure control, adjustment of the mA and/or kV according to patient size and/or use of iterative reconstruction technique. CONTRAST:  OMNIPAQUE  IOHEXOL  300 MG/ML  SOLN COMPARISON:  CT abdomen pelvis 11/18/2022 FINDINGS: Lower chest: No acute abnormality. Hepatobiliary: No focal hepatic lesion. Fluid and gas containing structure in the porta hepatis may be a remnant gallbladder stump as this communicates with the biliary tree. This is unchanged from 11/18/2022. Per chart review the patient is status post cholecystectomy. Pneumobilia. Unchanged prominence of the common bile duct measuring 10 mm in diameter. Pancreas: Unremarkable. Spleen: Unremarkable. Adrenals/Urinary Tract: Normal adrenal glands. Punctate nonobstructing stones bilaterally. No hydronephrosis. Urothelial thickening about the mid left ureter is likely reactive secondary to adjacent diverticulitis. Gas is present within the bladder. Bladder wall thickening in the dome of the bladder. Stomach/Bowel: Stomach is within normal limits. Duodenal diverticulum. No bowel obstruction. Normal appendix. Extensive sigmoid and descending colon diverticulosis. There is marked wall thickening with adjacent inflammatory fat stranding and pericolonic fluid about the sigmoid colon. Peripherally enhancing irregular gas and fluid collection between the  inflamed portions of the sigmoid colon in the dome of the bladder measuring 4.4 x 3.3 x 2.4 cm (series 2/image 69 and 7/69). Fluid-filled tracts extend between the sigmoid colon in the bladder. Along with the gas in the bladder this is highly suspicious for colovesical fistula with possible additional colocolic fistula. The gas and fluid collection extends posteriorly to the vaginal cuff (series 9/image 98). Small locule of gas in the vagina. Colovaginal fistula is difficult to exclude.  Vascular/Lymphatic: Aortic atherosclerosis. No enlarged abdominal or pelvic lymph nodes. Reproductive: Hysterectomy. See above for discussion about possible colo vaginal fistula. No adnexal mass. Other: Ventral abdominal wall hernia repair. Musculoskeletal: No acute fracture. IMPRESSION: Complicated sigmoid diverticulitis with 4.4 cm gas containing abscess between the sigmoid colon and bladder. 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 Soft tissue tracts extend between portions of the sigmoid colon as well as to the vaginal cuff suspicious for additional colocolic and colovaginal fistulas. Aortic Atherosclerosis (ICD10-I70.0). Electronically Signed   By: Rozell Cornet M.D.   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 - Patient is currently hemodynamically  stable without fever, tachycardia or systolic hypotension.  No peritonitis on exam.  No current indication for emergency surgery - Recommend IR consult for drainage - Agree with IV antibiotics - Keep NPO - Hopefully patient will improve with conservative treatment.  If patient fails to improve they may require repeating imaging, drain placement, or surgical intervention resulting in a colectomy/colostomy.  This was discussed with the patient.  - We will follow with you  FEN - NPO for IR consult, IVF per primary  VTE - SCDs, okay for chem ppx from a general surgery standpoint but would ask IR before initiating ID - Zosyn  I reviewed nursing notes, ED provider notes, hospitalist notes, last 24 h vitals and pain scores, last 48 h intake and output, last 24 h labs and trends, and last 24 h imaging results.  Delton Filbert, [facility] Surgery 11/11/2023, 9:44 AM Please see Amion for pager number during day hours 7:00am-4:30pm

## 2023-11-12 DIAGNOSIS — K5720 Diverticulitis of large intestine with perforation and abscess without bleeding: Principal | ICD-10-CM

## 2023-11-12 DIAGNOSIS — I480 Paroxysmal atrial fibrillation: Secondary | ICD-10-CM

## 2023-11-12 DIAGNOSIS — K2100 Gastro-esophageal reflux disease with esophagitis, without bleeding: Secondary | ICD-10-CM

## 2023-11-12 DIAGNOSIS — K5792 Diverticulitis of intestine, part unspecified, without perforation or abscess without bleeding: Secondary | ICD-10-CM | POA: Diagnosis not present

## 2023-11-12 LAB — COMPREHENSIVE METABOLIC PANEL WITH GFR
ALT: 17 U/L (ref 0–44)
AST: 18 U/L (ref 15–41)
Albumin: 2.4 g/dL — ABNORMAL LOW (ref 3.5–5.0)
Alkaline Phosphatase: 52 U/L (ref 38–126)
Anion gap: 6 (ref 5–15)
BUN: 7 mg/dL — ABNORMAL LOW (ref 8–23)
CO2: 26 mmol/L (ref 22–32)
Calcium: 8.4 mg/dL — ABNORMAL LOW (ref 8.9–10.3)
Chloride: 108 mmol/L (ref 98–111)
Creatinine, Ser: 0.8 mg/dL (ref 0.44–1.00)
GFR, Estimated: >60 mL/min (ref >60)
Glucose, Bld: 86 mg/dL (ref 70–99)
Potassium: 3.9 mmol/L (ref 3.5–5.1)
Sodium: 140 mmol/L (ref 135–145)
Total Bilirubin: 0.7 mg/dL (ref 0.0–1.2)
Total Protein: 5.5 g/dL — ABNORMAL LOW (ref 6.5–8.1)

## 2023-11-12 LAB — CBC
HCT: 36.8 % (ref 36.0–46.0)
Hemoglobin: 11.4 g/dL — ABNORMAL LOW (ref 12.0–15.0)
MCH: 29.4 pg (ref 26.0–34.0)
MCHC: 31 g/dL (ref 30.0–36.0)
MCV: 94.8 fL (ref 80.0–100.0)
Platelets: 512 10*3/uL — ABNORMAL HIGH (ref 150–400)
RBC: 3.88 MIL/uL (ref 3.87–5.11)
RDW: 14.9 % (ref 11.5–15.5)
WBC: 13.5 10*3/uL — ABNORMAL HIGH (ref 4.0–10.5)
nRBC: 0 % (ref 0.0–0.2)

## 2023-11-12 LAB — HEPARIN LEVEL (UNFRACTIONATED): Heparin Unfractionated: 0.23 [IU]/mL — ABNORMAL LOW (ref 0.30–0.70)

## 2023-11-12 LAB — APTT: aPTT: 48 s — ABNORMAL HIGH (ref 24–36)

## 2023-11-12 MED ORDER — HEPARIN (PORCINE) 25000 UT/250ML-% IV SOLN
1300.0000 [IU]/h | INTRAVENOUS | Status: AC
  Administered 2023-11-12: 950 [IU]/h via INTRAVENOUS
  Administered 2023-11-13 – 2023-11-14 (×2): 1300 [IU]/h via INTRAVENOUS
  Filled 2023-11-12 (×3): qty 250

## 2023-11-12 MED ORDER — HEPARIN BOLUS VIA INFUSION
4000.0000 [IU] | Freq: Once | INTRAVENOUS | Status: AC
  Administered 2023-11-12: 4000 [IU] via INTRAVENOUS
  Filled 2023-11-12: qty 4000

## 2023-11-12 NOTE — Progress Notes (Addendum)
 Subjective: CC: S/p IR drain yesterday with sanguinous and proteinaceous material.   Feeling better. LLQ abdominal pain improved to a 2/10. Has not required prn pain medication this am. Tolerating fld without n/v. Passing flatus. No BM. Pneumaturia and vaginal d/c after resolved.   Afebrile. No tachycardia. BP 115/49 on last check. WBC 13.5 (15.5)  Objective: Vital signs in last 24 hours: Temp:  [97.9 F (36.6 C)-98.8 F (37.1 C)] 98.1 F (36.7 C) (05/08 0528) Pulse Rate:  [68-77] 68 (05/08 0528) Resp:  [14-23] 15 (05/08 0528) BP: (95-116)/(41-59) 115/49 (05/08 0528) SpO2:  [90 %-99 %] 93 % (05/08 0528) Weight:  [73.5 kg] 73.5 kg (05/08 0719) Last BM Date : 11/10/23  Intake/Output from previous day: 05/07 0701 - 05/08 0700 In: 2623.5 [P.O.:1130; I.V.:1343.5; IV Piggyback:150] Out: 1315 [Urine:1300; Drains:15] Intake/Output this shift: No intake/output data recorded.  PE: Gen:  Alert, NAD, pleasant Abd: Soft, ND, mild llq abdominal pain without rigidity or guarding, +BS. IR drain bloody ss - 15cc/24 hours.   Lab Results:  Recent Labs    11/11/23 0512 11/12/23 0451  WBC 15.5* 13.5*  HGB 12.6 11.4*  HCT 39.7 36.8  PLT 561* 512*   BMET Recent Labs    11/11/23 0512 11/12/23 0451  NA 134* 140  K 3.4* 3.9  CL 104 108  CO2 23 26  GLUCOSE 120* 86  BUN 6* 7*  CREATININE 0.89 0.80  CALCIUM  8.4* 8.4*   PT/INR Recent Labs    11/11/23 0512  LABPROT 13.6  INR 1.0   CMP     Component Value Date/Time   NA 140 11/12/2023 0451   NA 143 12/07/2019 0751   K 3.9 11/12/2023 0451   CL 108 11/12/2023 0451   CO2 26 11/12/2023 0451   GLUCOSE 86 11/12/2023 0451   BUN 7 (L) 11/12/2023 0451   BUN 15 12/07/2019 0751   CREATININE 0.80 11/12/2023 0451   CALCIUM  8.4 (L) 11/12/2023 0451   PROT 5.5 (L) 11/12/2023 0451   PROT 6.4 09/07/2020 1311   ALBUMIN 2.4 (L) 11/12/2023 0451   ALBUMIN 3.9 09/07/2020 1311   AST 18 11/12/2023 0451   ALT 17 11/12/2023 0451    ALKPHOS 52 11/12/2023 0451   BILITOT 0.7 11/12/2023 0451   BILITOT <0.2 09/07/2020 1311   GFRNONAA >60 11/12/2023 0451   GFRAA >60 01/03/2020 1357   Lipase     Component Value Date/Time   LIPASE 33 11/10/2023 1939    Studies/Results: CT GUIDED PERITONEAL/RETROPERITONEAL FLUID DRAIN BY PERC CATH Result Date: 11/11/2023 Images from the original result were not included. Pre procedural Dx: pelvic abscess Post procedural Dx: Same Technically successful CT guided placed of a 10 Fr drainage catheter placement  yielding sanguinous and proteinaceous material.  A representative aspirated sample was capped and sent to the laboratory for analysis.  EBL: Trace Complications: None immediate Zettie Hillock, MD Pager #: [phone]   CT ABDOMEN PELVIS W CONTRAST Result Date: 11/11/2023 CLINICAL DATA:  Left lower quadrant abdominal pain for 1 week. Worsens with bowel movement. History of diverticulitis. EXAM: CT ABDOMEN AND PELVIS WITH CONTRAST TECHNIQUE: Multidetector CT imaging of the abdomen and pelvis was performed using the standard protocol following bolus administration of intravenous contrast. RADIATION DOSE REDUCTION: This exam was performed according to the departmental dose-optimization program which includes automated exposure control, adjustment of the mA and/or kV according to patient size and/or use of iterative reconstruction technique. CONTRAST:  OMNIPAQUE  IOHEXOL  300 MG/ML  SOLN  COMPARISON:  CT abdomen pelvis 11/18/2022 FINDINGS: Lower chest: No acute abnormality. Hepatobiliary: No focal hepatic lesion. Fluid and gas containing structure in the porta hepatis may be a remnant gallbladder stump as this communicates with the biliary tree. This is unchanged from 11/18/2022. Per chart review the patient is status post cholecystectomy. Pneumobilia. Unchanged prominence of the common bile duct measuring 10 mm in diameter. Pancreas: Unremarkable. Spleen: Unremarkable. Adrenals/Urinary Tract: Normal adrenal  glands. Punctate nonobstructing stones bilaterally. No hydronephrosis. Urothelial thickening about the mid left ureter is likely reactive secondary to adjacent diverticulitis. Gas is present within the bladder. Bladder wall thickening in the dome of the bladder. Stomach/Bowel: Stomach is within normal limits. Duodenal diverticulum. No bowel obstruction. Normal appendix. Extensive sigmoid and descending colon diverticulosis. There is marked wall thickening with adjacent inflammatory fat stranding and pericolonic fluid about the sigmoid colon. Peripherally enhancing irregular gas and fluid collection between the inflamed portions of the sigmoid colon in the dome of the bladder measuring 4.4 x 3.3 x 2.4 cm (series 2/image 69 and 7/69). Fluid-filled tracts extend between the sigmoid colon in the bladder. Along with the gas in the bladder this is highly suspicious for colovesical fistula with possible additional colocolic fistula. The gas and fluid collection extends posteriorly to the vaginal cuff (series 9/image 98). Small locule of gas in the vagina. Colovaginal fistula is difficult to exclude. Vascular/Lymphatic: Aortic atherosclerosis. No enlarged abdominal or pelvic lymph nodes. Reproductive: Hysterectomy. See above for discussion about possible colo vaginal fistula. No adnexal mass. Other: Ventral abdominal wall hernia repair. Musculoskeletal: No acute fracture. IMPRESSION: Complicated sigmoid diverticulitis with 4.4 cm gas containing abscess between the sigmoid colon and bladder. 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 Soft tissue tracts extend between portions of the sigmoid colon as well as to the vaginal cuff suspicious for additional colocolic and colovaginal fistulas. Aortic Atherosclerosis (ICD10-I70.0). Electronically Signed   By: Rozell Cornet M.D.   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 - S/p IR drain 5/7. Checking with IR if drain cx was sent.  - No current indication for emergency surgery - Cont IV abx - Hopefully patient will improve with conservative treatment.  If patient fails to improve they may require repeating imaging, drain placement, or surgical intervention resulting in a colectomy/colostomy.  This was discussed with the patient.  - Will adv diet today. If pain, exam and wbc continues to improve tomorrow, could likely d/c in the AM with f/u with IR and Dr. Dorrie Gaudier in the office.  - We will follow with you   FEN - Soft, IVF per primary  VTE - SCDs, okay for therapeutic lovenox or heparin gtt from our standpoint. Last dose of Xarelto for 5/5 PM ID - Zosyn  I reviewed  nursing notes, Consultant (IR) notes, hospitalist notes, last 24 h vitals and pain scores, last 48 h intake and output, last 24 h labs and trends, and last 24 h imaging results.    LOS: 1 day    Delton Filbert, [facility] 11/12/2023, 9:12 AM Please see Amion for pager number during day hours 7:00am-4:30pm

## 2023-11-12 NOTE — Plan of Care (Signed)
  Problem: Clinical Measurements: Goal: Diagnostic test results will improve Outcome: Progressing   Problem: Activity: Goal: Risk for activity intolerance will decrease Outcome: Progressing   Problem: Nutrition: Goal: Adequate nutrition will be maintained Outcome: Progressing   Problem: Clinical Measurements: Goal: Diagnostic test results will improve Outcome: Progressing

## 2023-11-12 NOTE — Progress Notes (Signed)
 Referring Physician(s): Howerter,J/Burton,V  Supervising Physician: Myrlene Asper  Patient Status:  [facility] - In-pt  Chief Complaint: Left lower abdominal pain, pneumaturia, diverticular abscess with possible colovesical fistula; s/p lower abdominal drain placement 5/7   Subjective: Patient doing fairly well today; denies worsening abdominal pain, nausea, vomiting. States she may go home tomorrow.   Allergies: Patient has no known allergies.  Medications: Prior to Admission medications   Medication Sig Start Date End Date Taking? Authorizing Provider  amiodarone (PACERONE) 200 MG tablet Take 200 mg by mouth at bedtime.   Yes [provider]  amLODipine (NORVASC) 5 MG tablet Take 5 mg by mouth 2 (two) times daily.   Yes [provider]  atorvastatin (LIPITOR) 80 MG tablet Take 80 mg by mouth at bedtime.   Yes [provider]  calcium -vitamin D (OSCAL WITH D) 500-5 MG-MCG tablet Take 1 tablet by mouth.   Yes [provider]  ciprofloxacin  (CIPRO ) 500 MG tablet Take 1 tablet by mouth every 12 (twelve) hours. 11/05/23 11/12/23 Yes [provider]  Cyanocobalamin (VITAMIN B 12 PO) Place 3,000 mcg under the tongue 3 (three) times a week.   Yes [provider]  Cyanocobalamin (VITAMIN B-12 IJ) Inject 1,000 mcg into the muscle once a week. Wednesday   Yes [provider]  Dexlansoprazole  (DEXILANT ) 30 MG capsule DR Take 30 mg by mouth daily as needed (acid reflux).   Yes [provider]  ergocalciferol (VITAMIN D2) 1.25 MG (50000 UT) capsule Take 50,000 Units by mouth once a week. wednesday   Yes [provider]  Magnesium Citrate (MAGNESIUM GUMMIES PO) Take 2 tablets by mouth daily.   Yes [provider]  metoprolol succinate (TOPROL-XL) 50 MG 24 hr tablet Take 50 mg by mouth 2 (two) times daily. 12/12/22  Yes [provider]  predniSONE  (DELTASONE ) 5 MG tablet Take 5 mg by mouth daily. 05/12/20   Yes [provider]  SENNA-TIME 8.6 MG tablet Take 2 tablets by mouth as needed for constipation.   Yes [provider]  sulfaSALAzine (AZULFIDINE) 500 MG tablet Take 500 mg by mouth daily. 01/12/23  Yes [provider]  XARELTO 20 MG TABS tablet Take 20 mg by mouth daily. 12/12/22  Yes [provider]  alendronate (FOSAMAX) 70 MG tablet Take 70 mg by mouth every Monday. Take with a full glass of water  on an empty stomach. Patient not taking: Reported on 11/11/2023    [provider]     Vital Signs: BP (!) 125/57 (BP Location: Left Arm)   Pulse 71   Temp 97.7 F (36.5 C)   Resp 18   Ht 5\' 3"  (1.6 m)   Wt 162 lb 2 oz (73.5 kg)   SpO2 94%   BMI 28.72 kg/m   Physical Exam awake, alert.  Lower mid abdominal drain intact, dressing dry, not significantly tender to palpation, output about 15 cc of hazy, blood-tinged fluid.  Drain flushed without difficulty  Imaging: CT GUIDED PERITONEAL/RETROPERITONEAL FLUID DRAIN BY PERC CATH Result Date: 11/11/2023 Images from the original result were not included. Pre procedural Dx: pelvic abscess Post procedural Dx: Same Technically successful CT guided placed of a 10 Fr drainage catheter placement  yielding sanguinous and proteinaceous material.  A representative aspirated sample was capped and sent to the laboratory for analysis.  EBL: Trace Complications: None immediate Zettie Hillock, MD Pager #: [phone]   CT ABDOMEN PELVIS W CONTRAST Result Date: 11/11/2023 CLINICAL DATA:  Left lower  quadrant abdominal pain for 1 week. Worsens with bowel movement. History of diverticulitis. EXAM: CT ABDOMEN AND PELVIS WITH CONTRAST TECHNIQUE: Multidetector CT imaging of the abdomen and pelvis was performed using the standard protocol following bolus administration of intravenous contrast. RADIATION DOSE REDUCTION: This exam was performed according to the departmental dose-optimization program which includes automated exposure control,  adjustment of the mA and/or kV according to patient size and/or use of iterative reconstruction technique. CONTRAST:  OMNIPAQUE  IOHEXOL  300 MG/ML  SOLN COMPARISON:  CT abdomen pelvis 11/18/2022 FINDINGS: Lower chest: No acute abnormality. Hepatobiliary: No focal hepatic lesion. Fluid and gas containing structure in the porta hepatis may be a remnant gallbladder stump as this communicates with the biliary tree. This is unchanged from 11/18/2022. Per chart review the patient is status post cholecystectomy. Pneumobilia. Unchanged prominence of the common bile duct measuring 10 mm in diameter. Pancreas: Unremarkable. Spleen: Unremarkable. Adrenals/Urinary Tract: Normal adrenal glands. Punctate nonobstructing stones bilaterally. No hydronephrosis. Urothelial thickening about the mid left ureter is likely reactive secondary to adjacent diverticulitis. Gas is present within the bladder. Bladder wall thickening in the dome of the bladder. Stomach/Bowel: Stomach is within normal limits. Duodenal diverticulum. No bowel obstruction. Normal appendix. Extensive sigmoid and descending colon diverticulosis. There is marked wall thickening with adjacent inflammatory fat stranding and pericolonic fluid about the sigmoid colon. Peripherally enhancing irregular gas and fluid collection between the inflamed portions of the sigmoid colon in the dome of the bladder measuring 4.4 x 3.3 x 2.4 cm (series 2/image 69 and 7/69). Fluid-filled tracts extend between the sigmoid colon in the bladder. Along with the gas in the bladder this is highly suspicious for colovesical fistula with possible additional colocolic fistula. The gas and fluid collection extends posteriorly to the vaginal cuff (series 9/image 98). Small locule of gas in the vagina. Colovaginal fistula is difficult to exclude. Vascular/Lymphatic: Aortic atherosclerosis. No enlarged abdominal or pelvic lymph nodes. Reproductive: Hysterectomy. See above for discussion about  possible colo vaginal fistula. No adnexal mass. Other: Ventral abdominal wall hernia repair. Musculoskeletal: No acute fracture. IMPRESSION: Complicated sigmoid diverticulitis with 4.4 cm gas containing abscess between the sigmoid colon and bladder. 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 Soft tissue tracts extend between portions of the sigmoid colon as well as to the vaginal cuff suspicious for additional colocolic and colovaginal fistulas. Aortic Atherosclerosis (ICD10-I70.0). Electronically Signed   By: Rozell Cornet M.D.   On: 11/11/2023 01:51    Labs:  CBC: Recent Labs    07/03/23 1416 11/10/23 1939 11/11/23 0512 11/12/23 0451  WBC 14.8* 21.7* 15.5* 13.5*  HGB 13.9 14.3 12.6 11.4*  HCT 43.9 44.7 39.7 36.8  PLT 569* 597* 561* 512*    COAGS: Recent Labs    11/11/23 0512  INR 1.0    BMP: Recent Labs    07/03/23 1416 11/10/23 1939 11/11/23 0512 11/12/23 0451  NA 135 137 134* 140  K 3.4* 3.0* 3.4* 3.9  CL 103 101 104 108  CO2 24 25 23 26   GLUCOSE 110* 108* 120* 86  BUN 10 8 6* 7*  CALCIUM  8.8* 9.2 8.4* 8.4*  CREATININE 0.77 1.07* 0.89 0.80  GFRNONAA >60 56* >60 >60    LIVER FUNCTION TESTS: Recent Labs    07/03/23 1416 11/10/23 1939 11/11/23 0512 11/12/23 0451  BILITOT 0.6 0.6 0.6 0.7  AST 18 18 14* 18  ALT 21 18 15 17   ALKPHOS 65 66 57 52  PROT 6.8 7.5 6.2* 5.5*  ALBUMIN 3.4* 3.3* 2.8* 2.4*    Assessment and Plan: Patient with history of complicated sigmoid diverticulitis with abscess between sigmoid colon and bladder , gas within bladder concerning for colovesical fistula; status post mid lower abdominal drain placement on 5/7; afebrile, WBC 13.5 down from 15.5, hemoglobin 11.4 down from 12.6, creatinine normal, drain fluid cultures pending;   Drain Location: ant mid lower abd Size: Fr size: 10 Fr Date of placement: 11/11/23  Currently to: Drain collection device: suction bulb 24 hour output:  Output by Drain (mL) 11/10/23  0701 - 11/10/23 1900 11/10/23 1901 - 11/11/23 0700 11/11/23 0701 - 11/11/23 1900 11/11/23 1901 - 11/12/23 0700 11/12/23 0701 - 11/12/23 1217  Closed System Drain 1 Midline Abdomen Bulb (JP) 10 Fr.   0 15 0      Current examination: Flushes/aspirates easily.  Insertion site unremarkable. Suture and stat lock in place. Dressed appropriately.   Plan: Continue TID flushes with 5 cc NS. Record output Q shift. Dressing changes QD or PRN if soiled.  Call IR APP or on call IR MD if difficulty flushing or sudden change in drain output.  Repeat imaging/possible drain injection once output < 10 mL/QD (excluding flush material). Consideration for drain removal if output is < 10 mL/QD (excluding flush material), pending discussion with the providing surgical service.  Discharge planning: Please contact IR APP or on call IR MD prior to patient d/c to ensure appropriate follow up plans are in place. Typically patient will follow up with IR clinic 10-14 days post d/c for repeat imaging/possible drain injection. IR scheduler will contact patient with date/time of appointment. Patient will need to flush drain QD with 5 cc NS, record output QD, dressing changes every 2-3 days or earlier if soiled.   IR will continue to follow - please call with questions or concerns.      Electronically Signed: D. Honore Lux, PA-C 11/12/2023, 12:12 PM   I spent a total of 15 Minutes at the the patient's bedside AND on the patient's hospital floor or unit, greater than 50% of which was counseling/coordinating care for abdominal abscess drain    Patient ID: Olivia [name], female   DOB: 08/02/1955, 68 y.o.   MRN: [id number]

## 2023-11-12 NOTE — Progress Notes (Signed)
 PHARMACY - ANTICOAGULATION CONSULT NOTE  Pharmacy Consult for heparin Indication: atrial fibrillation  No Known Allergies  Patient Measurements: Height: 5\' 3"  (160 cm) Weight: 73.5 kg (162 lb 2 oz) IBW/kg (Calculated) : 52.4 HEPARIN DW (KG): 67.9  Vital Signs: Temp: 98.1 F (36.7 C) (05/08 0528) Temp Source: Oral (05/08 0528) BP: 115/49 (05/08 0528) Pulse Rate: 68 (05/08 0528)  Labs: Recent Labs    11/10/23 1939 11/11/23 0512 11/12/23 0451  HGB 14.3 12.6 11.4*  HCT 44.7 39.7 36.8  PLT 597* 561* 512*  LABPROT  --  13.6  --   INR  --  1.0  --   CREATININE 1.07* 0.89 0.80    Estimated Creatinine Clearance: 63.7 mL/min (by C-G formula based on SCr of 0.8 mg/dL).   Medical History: Past Medical History:  Diagnosis Date   Diverticulitis    Dyslipidemia    PAD (peripheral artery disease) (HCC)    PONV (postoperative nausea and vomiting)    Psoriasis    RA (rheumatoid arthritis) (HCC)    Vertigo     Assessment: 68 year old female on Xarelto for atrial fibrillation, last dose 5/5. She presented with abdominal pain, imaging with abscess between sigmoid colon and bladder with findings also concerning for colovesical and colovaginal fistula. Percutaneous drain placed 5/7. Surgery following - no plan for surgical intervention at this time unless patient fails to improve in which case may need further imaging/interventions. Pharmacy consulted to initial heparin while continuing to hold Xarelto.  Goal of Therapy:  Heparin level 0.3-0.7 units/ml aPTT 66-102 seconds Monitor platelets by anticoagulation protocol: Yes   Plan:  -Heparin 4000 unit bolus (> 48 hrs since last dose of Xarelto) -Start heparin infusion at 950 units/hr -Check 6 hour aPTT and heparin level (if correlating, then follow heparin level alone) -Daily CBC  Lolita Rise, PharmD, BCPS Clinical Pharmacist 11/12/2023 10:15 AM

## 2023-11-12 NOTE — Progress Notes (Signed)
 PHARMACY - ANTICOAGULATION CONSULT NOTE  Pharmacy Consult for Heparin Indication: atrial fibrillation  No Known Allergies  Patient Measurements: Height: 5\' 3"  (160 cm) Weight: 73.5 kg (162 lb 2 oz) IBW/kg (Calculated) : 52.4 HEPARIN DW (KG): 67.9  Vital Signs: Temp: 97.7 F (36.5 C) (05/08 1124) BP: 125/57 (05/08 1124) Pulse Rate: 71 (05/08 1124)  Labs: Recent Labs    11/10/23 1939 11/11/23 0512 11/12/23 0451 11/12/23 1856  HGB 14.3 12.6 11.4*  --   HCT 44.7 39.7 36.8  --   PLT 597* 561* 512*  --   APTT  --   --   --  48*  LABPROT  --  13.6  --   --   INR  --  1.0  --   --   HEPARINUNFRC  --   --   --  0.23*  CREATININE 1.07* 0.89 0.80  --     Estimated Creatinine Clearance: 63.7 mL/min (by C-G formula based on SCr of 0.8 mg/dL).   Medical History: Past Medical History:  Diagnosis Date   Diverticulitis    Dyslipidemia    PAD (peripheral artery disease) (HCC)    PONV (postoperative nausea and vomiting)    Psoriasis    RA (rheumatoid arthritis) (HCC)    Vertigo    Assessment:  AC/Heme: hep gtt pending r/o procedure -Xarelto PTA for afib (LD 5/5) - Hep level 0.23, aPTT 48 both less than goal  Goal of Therapy:  Heparin level 0.3-0.7 units/ml Monitor platelets by anticoagulation protocol: Yes   Plan:  Increase IV heparin 1100 units/hr Check HL and CBC daily.   Kadon Andrus S. Zannie Hey, PharmD, BCPS Clinical Staff Pharmacist [email or web address] Zannie Hey, [employer] 11/12/2023,7:54 PM

## 2023-11-12 NOTE — Progress Notes (Addendum)
 [facility]                                                                              Olivia [name], is a 68 y.o. female, DOB - [birth year], UJW:[id number] Admit date - 11/10/2023    Outpatient Primary MD for the patient is [name], [name], PA-C  LOS - 1  days  Chief Complaint  Patient presents with   Abdominal Pain       Brief summary   Patient is a 68 y.o. female with longstanding recurrent diverticulitis with known dense colonic diverticulosis, prior transverse colon perforation requiring colostomy and subsequent takedown presented with abdominal pain. Patient states that she was hospitalized in [place] until 4/14 due to acute diverticulitis and was later diagnosed with UTI and has been on ciprofloxacin .  However despite this she has had continued dysuria, urgency and frequency of urination, along with what feels like air coming out of her bladder.  She also has had continued left lower quadrant abdominal pain, which never completely resolved. She has recently met Dr. Dorrie Gaudier of [facility] for outpatient consultation, hence presented to ED here.  She denied any fevers, diarrhea, had some mild nausea but no vomiting.  She has also seen GI outpatient.  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 Recently admitted at [facility] about 3 weeks ago, with sepsis secondary to diverticulitis, improved with antibiotics.  -Continue IV zosyn, IR and surgery consulted  - s/p perc drain placed 5/7 - continue to hold xarelto. - Per surgery, no current indication for emergent surgery.  Advancing diet today.    Hypertension -continue norvasc   Paroxysmal atrial fibrillation - continue  Toprol-XL, amiodarone. - Continue to hold Xarelto.  Last dose of Xarelto was 5/5. -Per surgery, okay for therapeutic Lovenox and heparin from surgical standpoint.   Hyperlipidemia - Lipitor  Hypokalemia - Replaced  Estimated body mass index is 28.41 kg/m as calculated from the following:   Height as of 11/02/23: 5\' 3"  (1.6 m).   Weight as of 11/02/23: 72.8 kg.  Code Status: Full code DVT Prophylaxis:  SCDs Start: 11/11/23 0310   Level of Care: Level of care: Telemetry Family Communication: Updated patient Disposition Plan:      Remains inpatient appropriate: If continues to improve, may DC home tomorrow pending surgery evaluation and clearance   Procedures:  Percutaneous drain 5/7  Consultants:    IR Gen surgery   Antimicrobials:   Anti-infectives (From admission, onward)    Start     Dose/Rate Route Frequency Ordered Stop   11/11/23 0800  piperacillin-tazobactam (ZOSYN) IVPB 3.375 g        3.375 g 12.5 mL/hr over 240 Minutes Intravenous Every 8 hours 11/11/23 0313     11/11/23 0200  piperacillin-tazobactam (ZOSYN) IVPB 3.375 g        3.375 g 100 mL/hr over 30 Minutes Intravenous  Once 11/11/23 0159 11/11/23  1610          Medications  amiodarone  200 mg Oral Daily   atorvastatin  80 mg Oral Daily   metoprolol succinate  50 mg Oral BID   sodium chloride  flush  5 mL Intracatheter Q8H      Subjective:   Olivia [name] was seen and examined today.  Feeling better today, ambulating in the room, no BM yet however passing flatus.  No nausea vomiting.  States that LLQ pain is improving.    Objective:   Vitals:   11/11/23 1735 11/11/23 2214 11/12/23 0210 11/12/23 0528  BP: (!) 109/50 (!) 112/47 (!) 98/46 (!) 115/49  Pulse: 73 72 70 68  Resp: 16 15 16 15   Temp: 98.6 F (37 C) 98.4 F (36.9 C) 97.9 F (36.6 C) 98.1 F (36.7 C)  TempSrc: Oral Oral Oral Oral  SpO2: 92% 90% 93% 93%    Intake/Output Summary (Last 24 hours) at 11/12/2023 0718 Last data filed  at 11/12/2023 0600 Gross per 24 hour  Intake 2623.51 ml  Output 1315 ml  Net 1308.51 ml     Wt Readings from Last 3 Encounters:  11/02/23 72.8 kg  07/03/23 73.5 kg  02/17/23 74.2 kg     Exam General: Alert and oriented x 3, NAD Cardiovascular: S1 S2 auscultated,  RRR Respiratory: Clear to auscultation bilaterally, no wheezing Gastrointestinal: Soft, mildly TTP LLQ, NBS, perc drain+ Ext: no pedal edema bilaterally Neuro: no new deficits Psych: Normal affect     Data Reviewed:  I have personally reviewed following labs    CBC Lab Results  Component Value Date   WBC 13.5 (H) 11/12/2023   RBC 3.88 11/12/2023   HGB 11.4 (L) 11/12/2023   HCT 36.8 11/12/2023   MCV 94.8 11/12/2023   MCH 29.4 11/12/2023   PLT 512 (H) 11/12/2023   MCHC 31.0 11/12/2023   RDW 14.9 11/12/2023   LYMPHSABS 3.3 11/11/2023   MONOABS 1.8 (H) 11/11/2023   EOSABS 0.0 11/11/2023   BASOSABS 0.1 11/11/2023     Last metabolic panel Lab Results  Component Value Date   NA 140 11/12/2023   K 3.9 11/12/2023   CL 108 11/12/2023   CO2 26 11/12/2023   BUN 7 (L) 11/12/2023   CREATININE 0.80 11/12/2023   GLUCOSE 86 11/12/2023   GFRNONAA >60 11/12/2023   GFRAA >60 01/03/2020   CALCIUM  8.4 (L) 11/12/2023   PHOS 3.6 05/26/2020   PROT 5.5 (L) 11/12/2023   ALBUMIN 2.4 (L) 11/12/2023   LABGLOB 3.0 12/07/2019   AGRATIO 1.4 12/07/2019   BILITOT 0.7 11/12/2023   ALKPHOS 52 11/12/2023   AST 18 11/12/2023   ALT 17 11/12/2023   ANIONGAP 6 11/12/2023    CBG (last 3)  No results for input(s): "GLUCAP" in the last 72 hours.    Coagulation Profile: Recent Labs  Lab 11/11/23 0512  INR 1.0     Radiology Studies: I have personally reviewed the imaging studies  CT GUIDED PERITONEAL/RETROPERITONEAL FLUID DRAIN BY PERC CATH Result Date: 11/11/2023 Images from the original result were not included. Pre procedural Dx: pelvic abscess Post procedural Dx: Same Technically successful CT guided placed of a 10  Fr drainage catheter placement  yielding sanguinous and proteinaceous material.  A representative aspirated sample was capped and sent to the laboratory for analysis.  EBL: Trace Complications: None immediate Zettie Hillock, MD Pager #: [phone]   CT ABDOMEN PELVIS W CONTRAST Result Date: 11/11/2023 CLINICAL DATA:  Left lower quadrant abdominal pain  for 1 week. Worsens with bowel movement. History of diverticulitis. EXAM: CT ABDOMEN AND PELVIS WITH CONTRAST TECHNIQUE: Multidetector CT imaging of the abdomen and pelvis was performed using the standard protocol following bolus administration of intravenous contrast. RADIATION DOSE REDUCTION: This exam was performed according to the departmental dose-optimization program which includes automated exposure control, adjustment of the mA and/or kV according to patient size and/or use of iterative reconstruction technique. CONTRAST:  OMNIPAQUE  IOHEXOL  300 MG/ML  SOLN COMPARISON:  CT abdomen pelvis 11/18/2022 FINDINGS: Lower chest: No acute abnormality. Hepatobiliary: No focal hepatic lesion. Fluid and gas containing structure in the porta hepatis may be a remnant gallbladder stump as this communicates with the biliary tree. This is unchanged from 11/18/2022. Per chart review the patient is status post cholecystectomy. Pneumobilia. Unchanged prominence of the common bile duct measuring 10 mm in diameter. Pancreas: Unremarkable. Spleen: Unremarkable. Adrenals/Urinary Tract: Normal adrenal glands. Punctate nonobstructing stones bilaterally. No hydronephrosis. Urothelial thickening about the mid left ureter is likely reactive secondary to adjacent diverticulitis. Gas is present within the bladder. Bladder wall thickening in the dome of the bladder. Stomach/Bowel: Stomach is within normal limits. Duodenal diverticulum. No bowel obstruction. Normal appendix. Extensive sigmoid and descending colon diverticulosis. There is marked wall thickening with adjacent inflammatory fat  stranding and pericolonic fluid about the sigmoid colon. Peripherally enhancing irregular gas and fluid collection between the inflamed portions of the sigmoid colon in the dome of the bladder measuring 4.4 x 3.3 x 2.4 cm (series 2/image 69 and 7/69). Fluid-filled tracts extend between the sigmoid colon in the bladder. Along with the gas in the bladder this is highly suspicious for colovesical fistula with possible additional colocolic fistula. The gas and fluid collection extends posteriorly to the vaginal cuff (series 9/image 98). Small locule of gas in the vagina. Colovaginal fistula is difficult to exclude. Vascular/Lymphatic: Aortic atherosclerosis. No enlarged abdominal or pelvic lymph nodes. Reproductive: Hysterectomy. See above for discussion about possible colo vaginal fistula. No adnexal mass. Other: Ventral abdominal wall hernia repair. Musculoskeletal: No acute fracture. IMPRESSION: Complicated sigmoid diverticulitis with 4.4 cm gas containing abscess between the sigmoid colon and bladder. 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 Soft tissue tracts extend between portions of the sigmoid colon as well as to the vaginal cuff suspicious for additional colocolic and colovaginal fistulas. Aortic Atherosclerosis (ICD10-I70.0). Electronically Signed   By: Rozell Cornet M.D.   On: 11/11/2023 01:51       Kaedon Fanelli M.D. [facility] 11/12/2023, 7:18 AM  Available via Epic secure chat 7am-7pm After 7 pm, please refer to night coverage provider listed on amion.

## 2023-11-13 DIAGNOSIS — K5720 Diverticulitis of large intestine with perforation and abscess without bleeding: Secondary | ICD-10-CM | POA: Diagnosis not present

## 2023-11-13 DIAGNOSIS — E785 Hyperlipidemia, unspecified: Secondary | ICD-10-CM | POA: Diagnosis not present

## 2023-11-13 DIAGNOSIS — K5792 Diverticulitis of intestine, part unspecified, without perforation or abscess without bleeding: Secondary | ICD-10-CM | POA: Diagnosis not present

## 2023-11-13 LAB — RENAL FUNCTION PANEL
Albumin: 2.8 g/dL — ABNORMAL LOW (ref 3.5–5.0)
Anion gap: 9 (ref 5–15)
BUN: 5 mg/dL — ABNORMAL LOW (ref 8–23)
CO2: 27 mmol/L (ref 22–32)
Calcium: 8.6 mg/dL — ABNORMAL LOW (ref 8.9–10.3)
Chloride: 103 mmol/L (ref 98–111)
Creatinine, Ser: 0.82 mg/dL (ref 0.44–1.00)
GFR, Estimated: >60 mL/min (ref >60)
Glucose, Bld: 103 mg/dL — ABNORMAL HIGH (ref 70–99)
Phosphorus: 3.5 mg/dL (ref 2.5–4.6)
Potassium: 3.7 mmol/L (ref 3.5–5.1)
Sodium: 139 mmol/L (ref 135–145)

## 2023-11-13 LAB — CBC
HCT: 40.7 % (ref 36.0–46.0)
Hemoglobin: 12.7 g/dL (ref 12.0–15.0)
MCH: 29.4 pg (ref 26.0–34.0)
MCHC: 31.2 g/dL (ref 30.0–36.0)
MCV: 94.2 fL (ref 80.0–100.0)
Platelets: 563 10*3/uL — ABNORMAL HIGH (ref 150–400)
RBC: 4.32 MIL/uL (ref 3.87–5.11)
RDW: 14.5 % (ref 11.5–15.5)
WBC: 16 10*3/uL — ABNORMAL HIGH (ref 4.0–10.5)
nRBC: 0 % (ref 0.0–0.2)

## 2023-11-13 LAB — HEPARIN LEVEL (UNFRACTIONATED)
Heparin Unfractionated: 0.16 [IU]/mL — ABNORMAL LOW (ref 0.30–0.70)
Heparin Unfractionated: 0.47 [IU]/mL (ref 0.30–0.70)
Heparin Unfractionated: 0.54 [IU]/mL (ref 0.30–0.70)

## 2023-11-13 MED ORDER — HEPARIN BOLUS VIA INFUSION
2000.0000 [IU] | Freq: Once | INTRAVENOUS | Status: AC
  Administered 2023-11-13: 2000 [IU] via INTRAVENOUS
  Filled 2023-11-13: qty 2000

## 2023-11-13 MED ORDER — FLUTICASONE PROPIONATE 50 MCG/ACT NA SUSP
1.0000 | Freq: Every day | NASAL | Status: DC
  Administered 2023-11-13 – 2023-11-18 (×5): 1 via NASAL
  Filled 2023-11-13: qty 16

## 2023-11-13 MED ORDER — PREDNISONE 5 MG PO TABS
5.0000 mg | ORAL_TABLET | Freq: Every day | ORAL | Status: DC
  Administered 2023-11-13 – 2023-11-18 (×6): 5 mg via ORAL
  Filled 2023-11-13 (×6): qty 1

## 2023-11-13 MED ORDER — LORATADINE 10 MG PO TABS
10.0000 mg | ORAL_TABLET | Freq: Every day | ORAL | Status: DC
  Administered 2023-11-13 – 2023-11-18 (×6): 10 mg via ORAL
  Filled 2023-11-13 (×6): qty 1

## 2023-11-13 MED ORDER — SULFASALAZINE 500 MG PO TBEC: 500.0000 mg | DELAYED_RELEASE_TABLET | Freq: Every day | ORAL | Status: DC

## 2023-11-13 NOTE — Progress Notes (Signed)
 Subjective: CC: Reports her abdominal pain has resolved. Tolerating soft diet without n/v. Passing flatus. No BM. Pneumaturia and vaginal d/c after resolved.   Afebrile. No tachycardia or systolic hypotension. WBC uptrending at 16 from 13.5. She reports she has baseline leukocytosis of 17-18 that she follows with hematology for at [facility] with no clear etiology.   Objective: Vital signs in last 24 hours: Temp:  [97.7 F (36.5 C)-98.5 F (36.9 C)] 98.5 F (36.9 C) (05/09 0539) Pulse Rate:  [70-72] 70 (05/09 0539) Resp:  [16-18] 16 (05/09 0539) BP: (122-129)/(52-57) 129/56 (05/09 0539) SpO2:  [94 %-96 %] 95 % (05/09 0539) Weight:  [72.9 kg] 72.9 kg (05/09 0500) Last BM Date : 11/10/23  Intake/Output from previous day: 05/08 0701 - 05/09 0700 In: 1040.6 [P.O.:610; I.V.:270.6; IV Piggyback:150] Out: 2100 [Urine:2050; Drains:50] Intake/Output this shift: Total I/O In: 240 [P.O.:240] Out: 200 [Urine:200]  PE: Gen:  Alert, NAD, pleasant Abd: Soft, ND, improved and very mild llq abdominal pain without rigidity or guarding, +BS. IR drain more brown/red tinged/milky fluid in JP - 50cc/24 hours.   Lab Results:  Recent Labs    11/12/23 0451 11/13/23 0452  WBC 13.5* 16.0*  HGB 11.4* 12.7  HCT 36.8 40.7  PLT 512* 563*   BMET Recent Labs    11/12/23 0451 11/13/23 0452  NA 140 139  K 3.9 3.7  CL 108 103  CO2 26 27  GLUCOSE 86 103*  BUN 7* 5*  CREATININE 0.80 0.82  CALCIUM  8.4* 8.6*   PT/INR Recent Labs    11/11/23 0512  LABPROT 13.6  INR 1.0   CMP     Component Value Date/Time   NA 139 11/13/2023 0452   NA 143 12/07/2019 0751   K 3.7 11/13/2023 0452   CL 103 11/13/2023 0452   CO2 27 11/13/2023 0452   GLUCOSE 103 (H) 11/13/2023 0452   BUN 5 (L) 11/13/2023 0452   BUN 15 12/07/2019 0751   CREATININE 0.82 11/13/2023 0452   CALCIUM  8.6 (L) 11/13/2023 0452   PROT 5.5 (L) 11/12/2023 0451   PROT 6.4 09/07/2020 1311   ALBUMIN 2.8 (L) 11/13/2023 0452    ALBUMIN 3.9 09/07/2020 1311   AST 18 11/12/2023 0451   ALT 17 11/12/2023 0451   ALKPHOS 52 11/12/2023 0451   BILITOT 0.7 11/12/2023 0451   BILITOT <0.2 09/07/2020 1311   GFRNONAA >60 11/13/2023 0452   GFRAA >60 01/03/2020 1357   Lipase     Component Value Date/Time   LIPASE 33 11/10/2023 1939    Studies/Results: CT GUIDED PERITONEAL/RETROPERITONEAL FLUID DRAIN BY PERC CATH Result Date: 11/11/2023 INDICATION: Pelvic diverticular abscess EXAM: CT-guided drainage catheter placement TECHNIQUE: Multidetector CT imaging of the pelvis was performed for evaluation and to allow targeting of the central diverticular abscess. RADIATION DOSE REDUCTION: This exam was performed according to the departmental dose-optimization program which includes automated exposure control, adjustment of the mA and/or kV according to patient size and/or use of iterative reconstruction technique. MEDICATIONS: The patient is currently admitted to the hospital and receiving intravenous antibiotics. The antibiotics were administered within an appropriate time frame prior to the initiation of the procedure. ANESTHESIA/SEDATION: Moderate (conscious) sedation was employed during this procedure. A total of Versed  2 mg and Fentanyl  100 mcg was administered intravenously by the radiology nurse. Total intra-service moderate Sedation Time: 27 minutes. The patient's level of consciousness and vital signs were monitored continuously by radiology nursing throughout the procedure under my direct supervision.  COMPLICATIONS: None immediate. PROCEDURE: Informed written consent was obtained from the patient after a thorough discussion of the procedural risks, benefits and alternatives. All questions were addressed. Maximal Sterile Barrier Technique was utilized including caps, mask, sterile gowns, sterile gloves, sterile drape, hand hygiene and skin antiseptic. A timeout was performed prior to the initiation of the procedure. In a supine  position, radiopaque markers were placed on the patient's skin and the pelvis was imaged using CT guidance. The skin was then marked and sterilely prepped with ChloraPrep. Local anesthesia was achieved with 1% lidocaine . Lidocaine  was infiltrated from the skin to the abdominal rectus. A 7 cm Yueh needle was then advanced from the skin incision to the midpoint of the pelvic abscess. The needle was then withdrawn leaving the sheath. An Amplatz wire was then advanced through the sheath. Access was then dilated using a 10 French fascial dilator. The dilator was removed and a 10 French pigtail catheter was advanced over the guidewire and coiled within the abscess. Guidewire was removed. Retention suture and sterile dressing were applied. The catheter was connected to a JP bulb. IMPRESSION: Satisfactory placement of a 10 French pelvic abscess drain. 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 - S/p IR drain 5/7. Cx pending.  - No current indication for emergency surgery - Cont IV abx - Hopefully patient will improve with conservative treatment.  If patient fails to improve they may require repeating imaging, drain placement, or surgical intervention resulting in a colectomy/colostomy.  This was discussed  with the patient.  - Clinically improving. With change in drain output and rising wbc, will keep today. If stable/improved tomorrow, could d/c as earlier as then from our standpoint with f/u with IR and Dr. Dorrie Gaudier in the office. To note she reports she has a baseline leukocytosis of 17-18 of unclear etiology that she follows w/ hematology for at [facility].  - We will follow with you   FEN - Soft, IVF per primary  VTE - SCDs, heparin  gtt. Last dose of Xarelto  for 5/5 PM ID - Zosyn   I reviewed nursing notes, Consultant (IR) notes, hospitalist notes, last 24 h vitals and pain scores, last 48 h intake and output, last 24 h labs and trends, and last 24 h imaging results.    LOS: 2 days    Delton Filbert, [facility] 11/13/2023, 10:53 AM Please see Amion for pager number during day hours 7:00am-4:30pm

## 2023-11-13 NOTE — Progress Notes (Signed)
 PHARMACY - ANTICOAGULATION CONSULT NOTE  Pharmacy Consult for heparin  Indication: atrial fibrillation  No Known Allergies  Patient Measurements: Height: 5\' 3"  (160 cm) Weight: 73.5 kg (162 lb 2 oz) IBW/kg (Calculated) : 52.4 HEPARIN  DW (KG): 67.9  Vital Signs: Temp: 98.3 F (36.8 C) (05/08 2127) Temp Source: Oral (05/08 2127) BP: 122/52 (05/08 2127) Pulse Rate: 72 (05/08 2127)  Labs: Recent Labs    11/10/23 1939 11/11/23 0512 11/12/23 0451 11/12/23 1856 11/13/23 0452  HGB 14.3 12.6 11.4*  --  12.7  HCT 44.7 39.7 36.8  --  40.7  PLT 597* 561* 512*  --  563*  APTT  --   --   --  48*  --   LABPROT  --  13.6  --   --   --   INR  --  1.0  --   --   --   HEPARINUNFRC  --   --   --  0.23* 0.16*  CREATININE 1.07* 0.89 0.80  --   --     Estimated Creatinine Clearance: 63.7 mL/min (by C-G formula based on SCr of 0.8 mg/dL).   Medical History: Past Medical History:  Diagnosis Date   Diverticulitis    Dyslipidemia    PAD (peripheral artery disease) (HCC)    PONV (postoperative nausea and vomiting)    Psoriasis    RA (rheumatoid arthritis) (HCC)    Vertigo     Assessment: 68 year old female on Xarelto  for atrial fibrillation, last dose 5/5. She presented with abdominal pain, imaging with abscess between sigmoid colon and bladder with findings also concerning for colovesical and colovaginal fistula. Percutaneous drain placed 5/7. Surgery following - no plan for surgical intervention at this time unless patient fails to improve in which case may need further imaging/interventions. Pharmacy consulted to initial heparin  while continuing to hold Xarelto .  11/13/23 Heparin  level = 0.16 (subtherapeutic) with heparin  gtt @ 1100 units/hr Hgb = 12.7; Pltc 563K RN reports no interruption of therapy; no line issues.  No bleeding complications noted  Goal of Therapy:  Heparin  level 0.3-0.7 units/ml aPTT 66-102 seconds Monitor platelets by anticoagulation protocol: Yes   Plan:   -Heparin  2000 unit bolus x 1 -Increase heparin  infusion to 1300 units/hr -Check 6 hour heparin  level -Daily CBC & heparin  level  Rulon Councilman, PharmD Clinical Pharmacist 11/13/2023 5:36 AM

## 2023-11-13 NOTE — Progress Notes (Signed)
 PHARMACY - ANTICOAGULATION CONSULT NOTE  Pharmacy Consult for heparin  Indication: hx atrial fibrillation (PTA xarelto  on hold)  No Known Allergies  Patient Measurements: Height: 5\' 3"  (160 cm) Weight: 72.9 kg (160 lb 11.5 oz) IBW/kg (Calculated) : 52.4 HEPARIN  DW (KG): 67.9  Vital Signs: Temp: 98.6 F (37 C) (05/09 1350) Temp Source: Oral (05/09 1350) BP: 134/61 (05/09 1350) Pulse Rate: 71 (05/09 1350)  Labs: Recent Labs    11/11/23 0512 11/12/23 0451 11/12/23 1856 11/13/23 0452 11/13/23 1207  HGB 12.6 11.4*  --  12.7  --   HCT 39.7 36.8  --  40.7  --   PLT 561* 512*  --  563*  --   APTT  --   --  48*  --   --   LABPROT 13.6  --   --   --   --   INR 1.0  --   --   --   --   HEPARINUNFRC  --   --  0.23* 0.16* 0.47  CREATININE 0.89 0.80  --  0.82  --     Estimated Creatinine Clearance: 61.9 mL/min (by C-G formula based on SCr of 0.82 mg/dL).   Medical History: Past Medical History:  Diagnosis Date   Diverticulitis    Dyslipidemia    PAD (peripheral artery disease) (HCC)    PONV (postoperative nausea and vomiting)    Psoriasis    RA (rheumatoid arthritis) (HCC)    Vertigo     Assessment: 68 year old female on Xarelto  for atrial fibrillation, last dose 5/5. She presented with abdominal pain, imaging with abscess between sigmoid colon and bladder with findings also concerning for colovesical and colovaginal fistula. Percutaneous drain placed 5/7. Surgery following - no plan for surgical intervention at this time unless patient fails to improve in which case may need further imaging/interventions. Pharmacy consulted to initial heparin  while continuing to hold Xarelto .  Today, 11/13/2023: - Confirmatory heparin  level 0.54, remains therapeutic on heparin  1300 units/hr - No bleeding or IV issues per RN   Goal of Therapy:  Heparin  level 0.3-0.7 units/ml aPTT 66-102 seconds Monitor platelets by anticoagulation protocol: Yes   Plan:  - Continue heparin  drip at 1300  units/hr - Daily HL and CBC - monitor for s/sx bleeding    Kendall Pauls PharmD, BCPS [facility] main pharmacy [phone] 11/13/2023 6:00 PM

## 2023-11-13 NOTE — Progress Notes (Signed)
 PHARMACY - ANTICOAGULATION CONSULT NOTE  Pharmacy Consult for heparin  Indication: hx atrial fibrillation (PTA xarelto  on hold)  No Known Allergies  Patient Measurements: Height: 5\' 3"  (160 cm) Weight: 72.9 kg (160 lb 11.5 oz) IBW/kg (Calculated) : 52.4 HEPARIN  DW (KG): 67.9  Vital Signs: Temp: 98.5 F (36.9 C) (05/09 0539) Temp Source: Oral (05/09 0539) BP: 129/56 (05/09 0539) Pulse Rate: 70 (05/09 0539)  Labs: Recent Labs    11/11/23 0512 11/12/23 0451 11/12/23 1856 11/13/23 0452  HGB 12.6 11.4*  --  12.7  HCT 39.7 36.8  --  40.7  PLT 561* 512*  --  563*  APTT  --   --  48*  --   LABPROT 13.6  --   --   --   INR 1.0  --   --   --   HEPARINUNFRC  --   --  0.23* 0.16*  CREATININE 0.89 0.80  --  0.82    Estimated Creatinine Clearance: 61.9 mL/min (by C-G formula based on SCr of 0.82 mg/dL).   Medical History: Past Medical History:  Diagnosis Date   Diverticulitis    Dyslipidemia    PAD (peripheral artery disease) (HCC)    PONV (postoperative nausea and vomiting)    Psoriasis    RA (rheumatoid arthritis) (HCC)    Vertigo     Assessment: 68 year old female on Xarelto  for atrial fibrillation, last dose 5/5. She presented with abdominal pain, imaging with abscess between sigmoid colon and bladder with findings also concerning for colovesical and colovaginal fistula. Percutaneous drain placed 5/7. Surgery following - no plan for surgical intervention at this time unless patient fails to improve in which case may need further imaging/interventions. Pharmacy consulted to initial heparin  while continuing to hold Xarelto .  Today, 11/13/2023: - heparin  level collected at noon is therapeutic at 0.47 - cbc stable - no bleeding documented   Goal of Therapy:  Heparin  level 0.3-0.7 units/ml aPTT 66-102 seconds Monitor platelets by anticoagulation protocol: Yes   Plan:  - Continue heparin  drip at 1300 units/hr - Recheck another heparin  level at 6p to confirm level  remains therapeutic before changing to daily monitoring - monitor for s/sx bleeding   Spurgeon Dyer, PharmD, BCPS Clinical Pharmacist 11/13/2023 12:57 PM

## 2023-11-13 NOTE — Progress Notes (Signed)
 [facility]                                                                              Olivia [name], is a 68 y.o. female, DOB - 1955-11-13, VWU:[id number] Admit date - 11/10/2023    Outpatient Primary MD for the patient is [name], [name], PA-C  LOS - 2  days  Chief Complaint  Patient presents with   Abdominal Pain       Brief summary   Patient is a 68 y.o. female with longstanding recurrent diverticulitis with known dense colonic diverticulosis, prior transverse colon perforation requiring colostomy and subsequent takedown presented with abdominal pain. Patient states that she was hospitalized in [place] until 4/14 due to acute diverticulitis and was later diagnosed with UTI and has been on ciprofloxacin .  However despite this she has had continued dysuria, urgency and frequency of urination, along with what feels like air coming out of her bladder.  She also has had continued left lower quadrant abdominal pain, which never completely resolved. She has recently met Dr. Dorrie Gaudier of [facility] for outpatient consultation, hence presented to ED here.  She denied any fevers, diarrhea, had some mild nausea but no vomiting.  She has also seen GI outpatient.  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 Recently admitted at [facility] about 3 weeks ago, with sepsis secondary to diverticulitis, improved with antibiotics.  - Continue IV Zosyn  - s/p perc drain placed 5/7 - continue to hold xarelto . - Per surgery, no current indication for emergent surgery.  Diet advanced and tolerating solid diet -Leukocytosis worse today, 16.0 although no fevers or any worsening pain.  Will await  general surgery and IR recommendations.  Cultures preliminary pending so far   Hypertension - BP stable, continue norvasc    Paroxysmal atrial fibrillation - continue Toprol -XL, amiodarone . - Continue to hold Xarelto .  Last dose of Xarelto  was 5/5. - Placed on IV heparin  drip.     Hyperlipidemia - Continue Lipitor  Hypokalemia - Replaced  Estimated body mass index is 28.47 kg/m as calculated from the following:   Height as of this encounter: 5\' 3"  (1.6 m).   Weight as of this encounter: 72.9 kg.  Code Status: Full code DVT Prophylaxis:  SCDs Start: 11/11/23 0310   Level of Care: Level of care: Telemetry Family Communication: Updated patient Disposition Plan:      Remains inpatient appropriate:   Procedures:  Percutaneous drain 5/7  Consultants:    IR Gen surgery   Antimicrobials:   Anti-infectives (From admission, onward)    Start     Dose/Rate Route Frequency Ordered Stop   11/11/23 0800  piperacillin -tazobactam (ZOSYN ) IVPB 3.375 g        3.375 g 12.5 mL/hr over 240 Minutes Intravenous Every 8 hours 11/11/23 0313     11/11/23 0200  piperacillin -tazobactam (ZOSYN ) IVPB 3.375 g  3.375 g 100 mL/hr over 30 Minutes Intravenous  Once 11/11/23 0159 11/11/23 0329          Medications  amiodarone   200 mg Oral Daily   atorvastatin   80 mg Oral Daily   fluticasone  1 spray Each Nare Daily   loratadine  10 mg Oral Daily   metoprolol  succinate  50 mg Oral BID   sodium chloride  flush  5 mL Intracatheter Q8H      Subjective:   Olivia [name] was seen and examined today.  Feeling better.  LLQ drain, no nausea vomiting, worsening abdominal pain or fevers.   Objective:   Vitals:   11/12/23 1124 11/12/23 2127 11/13/23 0500 11/13/23 0539  BP: (!) 125/57 (!) 122/52  (!) 129/56  Pulse: 71 72  70  Resp: 18 16  16   Temp: 97.7 F (36.5 C) 98.3 F (36.8 C)  98.5 F (36.9 C)  TempSrc:  Oral  Oral  SpO2: 94% 96%  95%  Weight:   72.9 kg   Height:         Intake/Output Summary (Last 24 hours) at 11/13/2023 1040 Last data filed at 11/13/2023 1000 Gross per 24 hour  Intake 1160.64 ml  Output 2300 ml  Net -1139.36 ml     Wt Readings from Last 3 Encounters:  11/13/23 72.9 kg  11/02/23 72.8 kg  07/03/23 73.5 kg   Physical Exam General: Alert and oriented x 3, NAD Cardiovascular: S1 S2 clear, RRR.  Respiratory: CTAB, no wheezing, rales or rhonchi Gastrointestinal: Soft, mild LLQ TTP, NBS, PERC drain+ Ext: no pedal edema bilaterally Neuro: no new deficits Psych: Normal affect     Data Reviewed:  I have personally reviewed following labs    CBC Lab Results  Component Value Date   WBC 16.0 (H) 11/13/2023   RBC 4.32 11/13/2023   HGB 12.7 11/13/2023   HCT 40.7 11/13/2023   MCV 94.2 11/13/2023   MCH 29.4 11/13/2023   PLT 563 (H) 11/13/2023   MCHC 31.2 11/13/2023   RDW 14.5 11/13/2023   LYMPHSABS 3.3 11/11/2023   MONOABS 1.8 (H) 11/11/2023   EOSABS 0.0 11/11/2023   BASOSABS 0.1 11/11/2023     Last metabolic panel Lab Results  Component Value Date   NA 139 11/13/2023   K 3.7 11/13/2023   CL 103 11/13/2023   CO2 27 11/13/2023   BUN 5 (L) 11/13/2023   CREATININE 0.82 11/13/2023   GLUCOSE 103 (H) 11/13/2023   GFRNONAA >60 11/13/2023   GFRAA >60 01/03/2020   CALCIUM  8.6 (L) 11/13/2023   PHOS 3.5 11/13/2023   PROT 5.5 (L) 11/12/2023   ALBUMIN 2.8 (L) 11/13/2023   LABGLOB 3.0 12/07/2019   AGRATIO 1.4 12/07/2019   BILITOT 0.7 11/12/2023   ALKPHOS 52 11/12/2023   AST 18 11/12/2023   ALT 17 11/12/2023   ANIONGAP 9 11/13/2023    CBG (last 3)  No results for input(s): "GLUCAP" in the last 72 hours.    Coagulation Profile: Recent Labs  Lab 11/11/23 0512  INR 1.0     Radiology Studies: I have personally reviewed the imaging studies  CT GUIDED PERITONEAL/RETROPERITONEAL FLUID DRAIN BY PERC CATH Result Date: 11/11/2023 INDICATION: Pelvic diverticular abscess EXAM: CT-guided drainage catheter placement  TECHNIQUE: Multidetector CT imaging of the pelvis was performed for evaluation and to allow targeting of the central diverticular abscess. RADIATION DOSE REDUCTION: This exam was performed according to the departmental dose-optimization program which includes automated exposure control, adjustment of the mA and/or kV  according to patient size and/or use of iterative reconstruction technique. MEDICATIONS: The patient is currently admitted to the hospital and receiving intravenous antibiotics. The antibiotics were administered within an appropriate time frame prior to the initiation of the procedure. ANESTHESIA/SEDATION: Moderate (conscious) sedation was employed during this procedure. A total of Versed  2 mg and Fentanyl  100 mcg was administered intravenously by the radiology nurse. Total intra-service moderate Sedation Time: 27 minutes. The patient's level of consciousness and vital signs were monitored continuously by radiology nursing throughout the procedure under my direct supervision. COMPLICATIONS: None immediate. PROCEDURE: Informed written consent was obtained from the patient after a thorough discussion of the procedural risks, benefits and alternatives. All questions were addressed. Maximal Sterile Barrier Technique was utilized including caps, mask, sterile gowns, sterile gloves, sterile drape, hand hygiene and skin antiseptic. A timeout was performed prior to the initiation of the procedure. In a supine position, radiopaque markers were placed on the patient's skin and the pelvis was imaged using CT guidance. The skin was then marked and sterilely prepped with ChloraPrep. Local anesthesia was achieved with 1% lidocaine . Lidocaine  was infiltrated from the skin to the abdominal rectus. A 7 cm Yueh needle was then advanced from the skin incision to the midpoint of the pelvic abscess. The needle was then withdrawn leaving the sheath. An Amplatz wire was then advanced through the sheath. Access was then  dilated using a 10 French fascial dilator. The dilator was removed and a 10 French pigtail catheter was advanced over the guidewire and coiled within the abscess. Guidewire was removed. Retention suture and sterile dressing were applied. The catheter was connected to a JP bulb. IMPRESSION: Satisfactory placement of a 10 French pelvic abscess drain. Electronically Signed   By: Susan Ensign   On: 11/11/2023 15:52       Willoughby Doell M.D. [facility] 11/13/2023, 10:40 AM  Available via Epic secure chat 7am-7pm After 7 pm, please refer to night coverage provider listed on amion.

## 2023-11-14 DIAGNOSIS — K5792 Diverticulitis of intestine, part unspecified, without perforation or abscess without bleeding: Secondary | ICD-10-CM | POA: Diagnosis not present

## 2023-11-14 DIAGNOSIS — K5720 Diverticulitis of large intestine with perforation and abscess without bleeding: Secondary | ICD-10-CM | POA: Diagnosis not present

## 2023-11-14 LAB — RENAL FUNCTION PANEL
Albumin: 2.5 g/dL — ABNORMAL LOW (ref 3.5–5.0)
Anion gap: 10 (ref 5–15)
BUN: <5 mg/dL — ABNORMAL LOW (ref 8–23)
CO2: 23 mmol/L (ref 22–32)
Calcium: 8.3 mg/dL — ABNORMAL LOW (ref 8.9–10.3)
Chloride: 106 mmol/L (ref 98–111)
Creatinine, Ser: 0.63 mg/dL (ref 0.44–1.00)
GFR, Estimated: >60 mL/min (ref >60)
Glucose, Bld: 99 mg/dL (ref 70–99)
Phosphorus: 3.2 mg/dL (ref 2.5–4.6)
Potassium: 3.1 mmol/L — ABNORMAL LOW (ref 3.5–5.1)
Sodium: 139 mmol/L (ref 135–145)

## 2023-11-14 LAB — CBC
HCT: 35.9 % — ABNORMAL LOW (ref 36.0–46.0)
Hemoglobin: 11.8 g/dL — ABNORMAL LOW (ref 12.0–15.0)
MCH: 29.5 pg (ref 26.0–34.0)
MCHC: 32.9 g/dL (ref 30.0–36.0)
MCV: 89.8 fL (ref 80.0–100.0)
Platelets: 561 10*3/uL — ABNORMAL HIGH (ref 150–400)
RBC: 4 MIL/uL (ref 3.87–5.11)
RDW: 14.2 % (ref 11.5–15.5)
WBC: 12.1 10*3/uL — ABNORMAL HIGH (ref 4.0–10.5)
nRBC: 0 % (ref 0.0–0.2)

## 2023-11-14 LAB — HEPARIN LEVEL (UNFRACTIONATED): Heparin Unfractionated: 0.47 [IU]/mL (ref 0.30–0.70)

## 2023-11-14 MED ORDER — MAGNESIUM CITRATE PO SOLN
1.0000 | Freq: Once | ORAL | Status: DC
  Filled 2023-11-14: qty 296

## 2023-11-14 MED ORDER — POLYETHYLENE GLYCOL 3350 17 G PO PACK
17.0000 g | PACK | Freq: Every day | ORAL | Status: DC
  Administered 2023-11-14 – 2023-11-16 (×3): 17 g via ORAL
  Filled 2023-11-14 (×3): qty 1

## 2023-11-14 MED ORDER — POTASSIUM CHLORIDE CRYS ER 20 MEQ PO TBCR
40.0000 meq | EXTENDED_RELEASE_TABLET | Freq: Once | ORAL | Status: AC
  Administered 2023-11-14: 40 meq via ORAL
  Filled 2023-11-14: qty 2

## 2023-11-14 MED ORDER — SENNOSIDES-DOCUSATE SODIUM 8.6-50 MG PO TABS
1.0000 | ORAL_TABLET | Freq: Two times a day (BID) | ORAL | Status: DC
  Administered 2023-11-14 – 2023-11-16 (×5): 1 via ORAL
  Filled 2023-11-14 (×6): qty 1

## 2023-11-14 MED ORDER — RIVAROXABAN 10 MG PO TABS
20.0000 mg | ORAL_TABLET | Freq: Every day | ORAL | Status: DC
  Administered 2023-11-14 – 2023-11-18 (×5): 20 mg via ORAL
  Filled 2023-11-14 (×5): qty 2

## 2023-11-14 MED ORDER — LACTULOSE 10 GM/15ML PO SOLN
20.0000 g | Freq: Once | ORAL | Status: AC
  Administered 2023-11-14: 20 g via ORAL
  Filled 2023-11-14: qty 30

## 2023-11-14 NOTE — Progress Notes (Signed)
   Subjective/Chief Complaint: No complaints   Objective: Vital signs in last 24 hours: Temp:  [97.9 F (36.6 C)-98.6 F (37 C)] 97.9 F (36.6 C) (05/10 0556) Pulse Rate:  [66-71] 66 (05/10 0556) Resp:  [16] 16 (05/10 0556) BP: (133-134)/(57-61) 133/57 (05/10 0556) SpO2:  [96 %-98 %] 96 % (05/10 0556) Weight:  [71.7 kg] 71.7 kg (05/10 0558) Last BM Date :  (pt states some time in april? takes senna/mag citrate at home)  Intake/Output from previous day: 05/09 0701 - 05/10 0700 In: 1467.4 [P.O.:1270; I.V.:122.5; IV Piggyback:74.9] Out: 2055 [Urine:2050; Drains:5] Intake/Output this shift: No intake/output data recorded.  General appearance: alert and cooperative Resp: clear to auscultation bilaterally Cardio: regular rate and rhythm GI: soft, nontender. Drain output low but purulent  Lab Results:  Recent Labs    11/13/23 0452 11/14/23 0539  WBC 16.0* 12.1*  HGB 12.7 11.8*  HCT 40.7 35.9*  PLT 563* 561*   BMET Recent Labs    11/13/23 0452 11/14/23 0539  NA 139 139  K 3.7 3.1*  CL 103 106  CO2 27 23  GLUCOSE 103* 99  BUN 5* <5*  CREATININE 0.82 0.63  CALCIUM  8.6* 8.3*   PT/INR No results for input(s): "LABPROT", "INR" in the last 72 hours. ABG No results for input(s): "PHART", "HCO3" in the last 72 hours.  Invalid input(s): "PCO2", "PO2"  Studies/Results: No results found.  Anti-infectives: Anti-infectives (From admission, onward)    Start     Dose/Rate Route Frequency Ordered Stop   11/11/23 0800  piperacillin -tazobactam (ZOSYN ) IVPB 3.375 g        3.375 g 12.5 mL/hr over 240 Minutes Intravenous Every 8 hours 11/11/23 0313     11/11/23 0200  piperacillin -tazobactam (ZOSYN ) IVPB 3.375 g        3.375 g 100 mL/hr over 30 Minutes Intravenous  Once 11/11/23 0159 11/11/23 0329       Assessment/Plan: s/p * No surgery found * Advance diet Ok to switch to oral abx Continue drain F/u with Dr. 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 - S/p IR drain 5/7. Cx pending.  - No current indication for emergency surgery - Cont IV abx - Hopefully patient will improve with conservative treatment.  If patient fails to improve they may require repeating imaging, drain placement, or surgical intervention resulting in a colectomy/colostomy.  This was discussed with the patient.  - Clinically improving. f/u with IR and Dr. Dorrie Gaudier in the office. To note she reports she has a baseline leukocytosis of 17-18 of unclear etiology that she follows w/ hematology for at [facility].  - We will follow with you   FEN - Soft, IVF per primary  VTE - SCDs, heparin  gtt. Last dose of Xarelto  for 5/5 PM ID - Zosyn   LOS: 3 days    Olivia [name] III 11/14/2023

## 2023-11-14 NOTE — Plan of Care (Signed)

## 2023-11-14 NOTE — Progress Notes (Addendum)
 [facility]                                                                              Olivia [name], is a 68 y.o. female, DOB - 1955-07-23, JXB:[id number] Admit date - 11/10/2023    Outpatient Primary MD for the patient is [name], [name], PA-C  LOS - 3  days  Chief Complaint  Patient presents with   Abdominal Pain       Brief summary   Patient is a 68 y.o. female with longstanding recurrent diverticulitis with known dense colonic diverticulosis, prior transverse colon perforation requiring colostomy and subsequent takedown presented with abdominal pain. Patient states that she was hospitalized in [place] until 4/14 due to acute diverticulitis and was later diagnosed with UTI and has been on ciprofloxacin .  However despite this she has had continued dysuria, urgency and frequency of urination, along with what feels like air coming out of her bladder.  She also has had continued left lower quadrant abdominal pain, which never completely resolved. She has recently met Dr. Dorrie Gaudier of [facility] for outpatient consultation, hence presented to ED here.  She denied any fevers, diarrhea, had some mild nausea but no vomiting.  She has also seen GI outpatient.  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 Recently admitted at [facility] about 3 weeks ago, with sepsis secondary to diverticulitis, improved with antibiotics. - s/p perc drain placed 5/7 - Per surgery, no current indication for emergent surgery.   - Diet advanced and tolerating solid diet, will resume Xarelto  - Currently on IV Zosyn , abscess cultures + E. coli.  Discussed with ID, Dr. Manandhar regarding p.o. antibiotic  options based on the sensitivities.  Per ID, continue IV Zosyn , no p.o. antibiotic options.  -Leukocytosis improving Addendum: I have consulted ID, Dr Gillian Lacrosse will see the pt for recommendations, may need PICC for home IV antibiotics if no PO antibiotics option. Will await ID recs.    Hypertension - BP stable, continue norvasc    Paroxysmal atrial fibrillation - continue Toprol -XL, amiodarone . - Resume Xarelto .  DC IV heparin  drip   Hyperlipidemia - Continue Lipitor  Hypokalemia - K3.1, replaced  Constipation - Placed on MiraLAX, Senokot-S daily Lactulose x 1  Estimated body mass index is 28 kg/m as calculated from the following:   Height as of this encounter: 5\' 3"  (1.6 m).   Weight as of this encounter: 71.7 kg.  Code Status: Full code DVT Prophylaxis:  rivaroxaban  (XARELTO ) tablet 20 mg Start: 11/14/23 1200 SCDs Start: 11/11/23 0310 rivaroxaban  (XARELTO ) tablet 20 mg   Level of Care: Level of care: Med-Surg Family Communication: Updated patient Disposition Plan:      Remains inpatient appropriate:    Procedures:  Percutaneous drain 5/7  Consultants:    IR Gen surgery   Antimicrobials:   Anti-infectives (From admission, onward)    Start     Dose/Rate Route Frequency  Ordered Stop   11/11/23 0800  piperacillin -tazobactam (ZOSYN ) IVPB 3.375 g        3.375 g 12.5 mL/hr over 240 Minutes Intravenous Every 8 hours 11/11/23 0313     11/11/23 0200  piperacillin -tazobactam (ZOSYN ) IVPB 3.375 g        3.375 g 100 mL/hr over 30 Minutes Intravenous  Once 11/11/23 0159 11/11/23 0329          Medications  amiodarone   200 mg Oral Daily   atorvastatin   80 mg Oral Daily   fluticasone  1 spray Each Nare Daily   loratadine  10 mg Oral Daily   metoprolol  succinate  50 mg Oral BID   polyethylene glycol  17 g Oral Daily   predniSONE   5 mg Oral Q breakfast   rivaroxaban   20 mg Oral Q lunch   senna-docusate  1 tablet Oral BID   sodium chloride  flush  5 mL  Intracatheter Q8H      Subjective:   Olivia [name] was seen and examined today.  No acute complaints, overall improving.  LLQ drain, no nausea vomiting, chest pain, shortness of breath, fevers. + Constipation Objective:   Vitals:   11/13/23 1350 11/13/23 2043 11/14/23 0556 11/14/23 0558  BP: 134/61 133/61 (!) 133/57   Pulse: 71 66 66   Resp: 16 16 16    Temp: 98.6 F (37 C) 98 F (36.7 C) 97.9 F (36.6 C)   TempSrc: Oral Oral Oral   SpO2: 98% 96% 96%   Weight:    71.7 kg  Height:        Intake/Output Summary (Last 24 hours) at 11/14/2023 1219 Last data filed at 11/14/2023 0600 Gross per 24 hour  Intake 1227.36 ml  Output 1855 ml  Net -627.64 ml     Wt Readings from Last 3 Encounters:  11/14/23 71.7 kg  11/02/23 72.8 kg  07/03/23 73.5 kg   Physical Exam General: Alert and oriented x 3, NAD Cardiovascular: S1 S2 clear, RRR.  Respiratory: CTAB, no wheezing Gastrointestinal: Soft, PERC drain +, ND, NBS  Ext: no pedal edema bilaterally Neuro: no new deficits Psych: Normal affect      Data Reviewed:  I have personally reviewed following labs    CBC Lab Results  Component Value Date   WBC 12.1 (H) 11/14/2023   RBC 4.00 11/14/2023   HGB 11.8 (L) 11/14/2023   HCT 35.9 (L) 11/14/2023   MCV 89.8 11/14/2023   MCH 29.5 11/14/2023   PLT 561 (H) 11/14/2023   MCHC 32.9 11/14/2023   RDW 14.2 11/14/2023   LYMPHSABS 3.3 11/11/2023   MONOABS 1.8 (H) 11/11/2023   EOSABS 0.0 11/11/2023   BASOSABS 0.1 11/11/2023     Last metabolic panel Lab Results  Component Value Date   NA 139 11/14/2023   K 3.1 (L) 11/14/2023   CL 106 11/14/2023   CO2 23 11/14/2023   BUN <5 (L) 11/14/2023   CREATININE 0.63 11/14/2023   GLUCOSE 99 11/14/2023   GFRNONAA >60 11/14/2023   GFRAA >60 01/03/2020   CALCIUM  8.3 (L) 11/14/2023   PHOS 3.2 11/14/2023   PROT 5.5 (L) 11/12/2023   ALBUMIN 2.5 (L) 11/14/2023   LABGLOB 3.0 12/07/2019   AGRATIO 1.4 12/07/2019   BILITOT 0.7 11/12/2023    ALKPHOS 52 11/12/2023   AST 18 11/12/2023   ALT 17 11/12/2023   ANIONGAP 10 11/14/2023    CBG (last 3)  No results for input(s): "GLUCAP" in the last 72 hours.    Coagulation  Profile: Recent Labs  Lab 11/11/23 0512  INR 1.0     Radiology Studies: I have personally reviewed the imaging studies  No results found.      Bertram Brocks M.D. [facility] 11/14/2023, 12:19 PM  Available via Epic secure chat 7am-7pm After 7 pm, please refer to night coverage provider listed on amion.

## 2023-11-14 NOTE — Progress Notes (Addendum)
 PHARMACY - ANTICOAGULATION CONSULT NOTE  Pharmacy Consult for heparin  Indication: hx atrial fibrillation (PTA xarelto  on hold)  No Known Allergies  Patient Measurements: Height: 5\' 3"  (160 cm) Weight: 71.7 kg (158 lb 1.1 oz) IBW/kg (Calculated) : 52.4 HEPARIN  DW (KG): 67.9  Vital Signs: Temp: 97.9 F (36.6 C) (05/10 0556) Temp Source: Oral (05/10 0556) BP: 133/57 (05/10 0556) Pulse Rate: 66 (05/10 0556)  Labs: Recent Labs    11/12/23 0451 11/12/23 1856 11/12/23 1856 11/13/23 0452 11/13/23 1207 11/13/23 1826 11/14/23 0539  HGB 11.4*  --   --  12.7  --   --  11.8*  HCT 36.8  --   --  40.7  --   --  35.9*  PLT 512*  --   --  563*  --   --  561*  APTT  --  48*  --   --   --   --   --   HEPARINUNFRC  --  0.23*   < > 0.16* 0.47 0.54 0.47  CREATININE 0.80  --   --  0.82  --   --  0.63   < > = values in this interval not displayed.    Estimated Creatinine Clearance: 63 mL/min (by C-G formula based on SCr of 0.63 mg/dL).   Medical History: Past Medical History:  Diagnosis Date   Diverticulitis    Dyslipidemia    PAD (peripheral artery disease) (HCC)    PONV (postoperative nausea and vomiting)    Psoriasis    RA (rheumatoid arthritis) (HCC)    Vertigo     Assessment: 68 year old female on Xarelto  for atrial fibrillation, last dose 5/5. She presented with abdominal pain, imaging with abscess between sigmoid colon and bladder with findings also concerning for colovesical and colovaginal fistula. Percutaneous drain placed 5/7. Surgery following - no plan for surgical intervention at this time unless patient fails to improve in which case may need further imaging/interventions. Pharmacy consulted to initial heparin  while continuing to hold Xarelto .  Today, 11/14/2023: - heparin  level collected remains therapeutic at 0.47 - cbc stable - no bleeding documented   Goal of Therapy:  Heparin  level 0.3-0.7 units/ml aPTT 66-102 seconds Monitor platelets by anticoagulation  protocol: Yes   Plan:  - Continue heparin  drip at 1300 units/hr - Daily heparin  level and CBC - Monitor for s/sx bleeding   Spurgeon Dyer, PharmD, BCPS Clinical Pharmacist 11/14/2023 8:02 AM  ________________________________ Adden: Pharmacy has been consulted to transition back to xarelto ., - d/c heparim drip  - start xarelto  20mg  daily - pharmacy will sign off  Sharlyn Deaner, PharmD, BCPS 11/14/2023 9:05 AM

## 2023-11-14 NOTE — Consult Note (Incomplete)
 [facility]                                                                                       Patient Identification: Patient Name: Olivia [name] MRN: [id number] Admit Date: 11/10/2023  7:10 PM Today's Date: 11/14/2023 Reason for consult: diverticulitis and abscess  Requesting provider: Dr Thelma Fire   Principal Problem:   Acute diverticulitis Active Problems:   Normocytic anemia   GERD (gastroesophageal reflux disease)   Dyslipidemia   Peripheral arterial disease (HCC)   Paroxysmal A-fib (HCC)   Antibiotics:  Zosyn  5/6-  Lines/Hardware:  Assessment # Complicated sigmoid diverticulitis with abscess, with multiple recurrences and prior h/o perforation and ostomy  - No intervention per surgery  - 5/7 CT guided JP drain placement. 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  No feasible p.o. antibiotics for discharge.  - Monitor CBC and CMP on abtx - Universal/standard isolation precautions  - d/w Primary team Dr. Zelda Hickman to start following starting 5/12  Addendum 10: 47, case discussed by primary with general surgery, no need for urology and gyn involvement currently and they will address it as an OP.   Rest of the management as per the primary team. Please call with questions or concerns.  Thank you for the consult  __________________________________________________________________________________________________________ HPI and Hospital Course: 68 Y O Female with prior h/o RA ( on prednisone  and sulfasalazine, declines having Psoriasis), PAD,  dyslipidemia, GERD, A fib, h/o ERCP and biliary stent placement s/p removal)  and h/o diverticulitis with transverse colon perforation/peritonitis requiring ostomy with subsequent takedown with multiple recurrence last year, recent admission at [facility] 3 weeks ago ( discharged on 4/14 and was taking Cipro  for UTI) ago who presented to ED 5/6 with LLQ abdominal pain for a week. Patient taking ciprofloxacin  for UTI at the time of ED presentation. She also had continued GU symptoms despite taking ciprofloxacin  with air coming out of bladder. Denied fevers, chills. Denied nausea,  vomiting, diarrhea, GU symptoms. No changes in appetite.   At ED afebrile  Labs remarkable for k 3, CR elevated to 1.07,  WBC 21.7 UA with moderate leukocytes, 21-50 RBCs   CT abdomen pelvis  Complicated sigmoid diverticulitis with 4.4 cm gas containing abscess between the sigmoid colon and bladder. 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 Soft tissue tracts extend between portions of the sigmoid colon as well as to the vaginal cuff suspicious for additional colocolic and colovaginal fistulas.   ROS: General- Denies fever, chills, loss of appetite and loss of weight HEENT - Denies headache, blurry vision, neck pain, sinus pain Chest - Denies any chest pain, SOB or cough CVS- Denies any dizziness/lightheadedness, syncopal attacks, palpitations Abdomen- Denies any nausea, vomiting, hematochezia and diarrhea Neuro - Denies  any weakness, numbness, tingling sensation Psych - Denies any changes in mood irritability or depressive symptoms GU- Denies any burning, dysuria, hematuria or increased frequency of urination Skin - denies any rashes/lesions MSK - denies any joint pain/swelling or restricted ROM   Past Medical History:  Diagnosis Date   Diverticulitis    Dyslipidemia    PAD (peripheral artery disease) (HCC)    PONV (postoperative nausea and vomiting)    Psoriasis    RA (rheumatoid  arthritis) (HCC)    Vertigo     Past Surgical History:  Procedure Laterality Date   ANEURYSM COILING Right    right toe    BILIARY BRUSHING  05/16/2020   Procedure: BILIARY BRUSHING;  Surgeon: Ruby Corporal, MD;  Location: [facility];  Service: Endoscopy;;   BILIARY BRUSHING  07/26/2020   Procedure: BILIARY BRUSHING;  Surgeon: Janel Medford, MD;  Location: [facility];  Service: Endoscopy;;   BILIARY STENT PLACEMENT  01/05/2020   Procedure: BILIARY STENT PLACEMENT;  Surgeon: Ruby Corporal, MD;  [facility];  Service: Endoscopy;;   BILIARY STENT PLACEMENT  05/16/2020   Procedure: BILIARY STENT PLACEMENT;  Surgeon: Ruby Corporal, MD;  Location: [facility];  Service: Endoscopy;;   CHOLECYSTECTOMY     COLONOSCOPY  03/07/2015   Propofol ; Surgeon: Dr. Petel; Three 6 mm rectosigmoid polyps, moderate left-sided diverticulosis, tortuous rectosigmoid colon. Pathology with inflamed hyperplastic polyp.   COLONOSCOPY WITH PROPOFOL  N/A 08/18/2019   non-bleeding internal hemorrhoids, pancolonic diverticulosis, two 4-7 mm polyps in rectum benign, no adenomas. Next colonoscopy in 10 years.    ENDOSCOPIC RETROGRADE CHOLANGIOPANCREATOGRAPHY (ERCP) WITH PROPOFOL  N/A 07/26/2020   Procedure: ENDOSCOPIC RETROGRADE CHOLANGIOPANCREATOGRAPHY (ERCP) WITH PROPOFOL ;  Surgeon: Janel Medford, MD;  Location: [facility];  Service: Endoscopy;  Laterality: N/A;   ERCP N/A 01/05/2020   Procedure: ENDOSCOPIC RETROGRADE CHOLANGIOPANCREATOGRAPHY (ERCP);  Surgeon: Ruby Corporal, MD;  [facility];  Service: Endoscopy;  Laterality: N/A;   ERCP N/A 05/16/2020   Procedure: ENDOSCOPIC RETROGRADE CHOLANGIOPANCREATOGRAPHY (ERCP);  Surgeon: Ruby Corporal, MD;  Location: [facility];  Service: Endoscopy;  Laterality: N/A;   ESOPHAGOGASTRODUODENOSCOPY (EGD) WITH PROPOFOL  N/A 07/26/2020   Procedure: ESOPHAGOGASTRODUODENOSCOPY (EGD) WITH PROPOFOL ;  Surgeon: Janel Medford, MD;  Location: [facility];  Service:  Endoscopy;  Laterality: N/A;   GASTROINTESTINAL STENT REMOVAL N/A 05/16/2020   Procedure: STENT REMOVAL;  Surgeon: Ruby Corporal, MD;  Location: [facility];  Service: Endoscopy;  Laterality: N/A;   HYSTERECTOMY ABDOMINAL WITH SALPINGO-OOPHORECTOMY     ILEOSTOMY  2005   per patient for bowel obstruction and peritonitis with anastomosis   INSERTION OF MESH     RLQ   POLYPECTOMY  08/18/2019   Procedure: POLYPECTOMY;  Surgeon: Suzette Espy, MD;  [facility];  Service: Endoscopy;;   REMOVAL OF STONES N/A 01/05/2020   Procedure: REMOVAL OF STONES;  Surgeon: Ruby Corporal, MD;  [facility];  Service: Endoscopy;  Laterality: N/A;   SPHINCTEROTOMY N/A 01/05/2020   Procedure: SPHINCTEROTOMY;  Surgeon: Ruby Corporal, MD;  [facility];  Service: Endoscopy;  Laterality: N/A;   SPHINCTEROTOMY N/A 05/16/2020   Procedure: SPHINCTEROTOMY;  Surgeon: Ruby Corporal, MD;  Location: [facility];  Service: Endoscopy;  Laterality: N/A;   SPYGLASS CHOLANGIOSCOPY N/A 07/26/2020   Procedure: ZHYQMVHQ CHOLANGIOSCOPY;  Surgeon: Janel Medford, MD;  Location: [facility];  Service: Endoscopy;  Laterality: N/A;   STENT REMOVAL  07/26/2020   Procedure: STENT REMOVAL;  Surgeon: Janel Medford, MD;  Location: [facility];  Service: Endoscopy;;   UPPER ESOPHAGEAL ENDOSCOPIC ULTRASOUND (EUS) N/A 07/26/2020   Procedure: UPPER ESOPHAGEAL ENDOSCOPIC ULTRASOUND (EUS);  Surgeon: Janel Medford, MD;  Location: [facility];  Service: Endoscopy;  Laterality: N/A;    Scheduled Meds:  amiodarone   200 mg Oral Daily   atorvastatin   80 mg Oral Daily   fluticasone  1 spray Each Nare Daily   loratadine  10 mg Oral Daily   metoprolol  succinate  50 mg Oral BID   polyethylene glycol  17 g Oral Daily   predniSONE   5 mg Oral Q breakfast   rivaroxaban   20 mg Oral Q lunch   senna-docusate  1 tablet Oral BID   sodium chloride  flush  5 mL Intracatheter Q8H   Continuous Infusions:   piperacillin -tazobactam (ZOSYN )  IV 3.375 g (11/14/23 1002)   PRN Meds:.acetaminophen  **OR** acetaminophen , albuterol , morphine  injection, naLOXone  (NARCAN )  injection, ondansetron  (ZOFRAN ) IV, traZODone   No Known Allergies  Social History   Socioeconomic History   Marital status: Married    Spouse name: Not on file   Number of children: Not on file   Years of education: Not on file   Highest education level: Not on file  Occupational History   Not on file  Tobacco Use   Smoking status: Former    Current packs/day: 0.00    Average packs/day: 0.5 packs/day for 50.0 years (25.0 ttl pk-yrs)    Types: Cigarettes    Start date: 08/14/1968    Quit date: 08/14/2018    Years since quitting: 5.2   Smokeless tobacco: Never  Vaping Use   Vaping status: Never Used  Substance and Sexual Activity   Alcohol use: Yes    Comment: "not much" per pt as of 07/03/23   Drug use: Never   Sexual activity: Yes    Birth control/protection: Post-menopausal, Surgical  Other Topics Concern   Not on file  Social History Narrative   Not on file   Social Drivers of Health   Financial Resource Strain: Not on file  Food Insecurity: No Food Insecurity (11/11/2023)   Hunger Vital Sign    Worried About Running Out of Food in the Last Year: Never true    Ran Out of Food in the Last Year: Never true  Transportation Needs: No Transportation Needs (11/11/2023)   PRAPARE - Administrator, Civil Service (Medical): No    Lack of Transportation (Non-Medical): No  Physical Activity: Not on file  Stress: Not on file  Social Connections: Socially Integrated (11/11/2023)   Social Connection and Isolation Panel [NHANES]    Frequency of Communication with Friends and Family: Three times a week    Frequency of Social Gatherings with Friends and Family: Once a week    Attends Religious Services: 1 to 4 times per year    Active Member of [employer] or Organizations: Yes    Attends Banker Meetings: 1 to  4 times per year    Marital Status: Married  Catering manager Violence: Not At Risk (11/11/2023)   Humiliation, Afraid, Rape, and Kick questionnaire    Fear of Current or Ex-Partner: No    Emotionally Abused: No    Physically Abused: No    Sexually Abused: No   Family History  Problem Relation Age of Onset   Lung cancer Mother    Lung cancer Father    Stomach cancer Brother    Breast cancer Cousin  Colon cancer Neg Hx    Vitals BP 132/63 (BP Location: Left Arm)   Pulse 70   Temp 98.6 F (37 C) (Oral)   Resp 16   Ht 5\' 3"  (1.6 m)   Wt 71.7 kg   SpO2 97%   BMI 28.00 kg/m    Physical Exam Constitutional: Adult female lying in the bed, nontoxic appearing, not in acute distress    Comments: HEENT WNL  Cardiovascular:     Rate and Rhythm: Normal rate and regular rhythm.     Heart sounds: S1 and S2  Pulmonary:     Effort: Pulmonary effort is normal.     Comments: Normal breath sounds  Abdominal:     Palpations: Abdomen is soft.     Tenderness: Nondistended and nontender.  Left lower quadrant JP drain with minimal whitish colored fluid  Musculoskeletal:        General: No swelling or tenderness in peripheral joints  Skin:    Comments: No rashes  Neurological:     General: awake, alert and oriented, grossly nonfocal  Psychiatric:        Mood and Affect: Mood normal.    Pertinent Microbiology Results for orders placed or performed during the hospital encounter of 11/10/23  Body fluid culture w Gram Stain     Status: None   Collection Time: 11/12/23 12:12 PM   Specimen: Pelvis; Body Fluid  Result Value Ref Range Status   Specimen Description   Final    PELVIS Performed at [facility], [address], [place], [place] [address]    Special Requests   Final    Normal Performed at [facility], [address], [place], [place] [address]    Gram Stain   Final    ABUNDANT WBC PRESENT, PREDOMINANTLY PMN NO ORGANISMS  SEEN Performed at [facility], [address] [place], [place] [address]    Culture RARE ESCHERICHIA COLI  Final   Report Status 11/14/2023 FINAL  Final   Organism ID, Bacteria ESCHERICHIA COLI  Final      Susceptibility   Escherichia coli - MIC*    AMPICILLIN >=32 RESISTANT Resistant     CEFEPIME <=0.12 SENSITIVE Sensitive     CEFTAZIDIME <=1 SENSITIVE Sensitive     CEFTRIAXONE <=0.25 SENSITIVE Sensitive     CIPROFLOXACIN  >=4 RESISTANT Resistant     GENTAMICIN <=1 SENSITIVE Sensitive     IMIPENEM <=0.25 SENSITIVE Sensitive     TRIMETH/SULFA >=320 RESISTANT Resistant     AMPICILLIN/SULBACTAM >=32 RESISTANT Resistant     PIP/TAZO <=4 SENSITIVE Sensitive ug/mL    * RARE ESCHERICHIA COLI   Pertinent Lab seen by me:    Latest Ref Rng & Units 11/15/2023    5:30 AM 11/14/2023    5:39 AM 11/13/2023    4:52 AM  CBC  WBC 4.0 - 10.5 K/uL 15.5  12.1  16.0   Hemoglobin 12.0 - 15.0 g/dL 21.3  08.6  57.8   Hematocrit 36.0 - 46.0 % 38.3  35.9  40.7   Platelets 150 - 400 K/uL 595  561  563       Latest Ref Rng & Units 11/15/2023    5:30 AM 11/14/2023    5:39 AM 11/13/2023    4:52 AM  CMP  Glucose 70 - 99 mg/dL 93  99  469   BUN 8 - 23 mg/dL <5  <5  5   Creatinine 0.44 - 1.00 mg/dL 6.29  5.28  0.82   Sodium 135 - 145 mmol/L 139  139  139   Potassium 3.5 - 5.1 mmol/L 3.6  3.1  3.7   Chloride 98 - 111 mmol/L 107  106  103   CO2 22 - 32 mmol/L 23  23  27    Calcium  8.9 - 10.3 mg/dL 8.5  8.3  8.6     Pertinent Imagings/Other Imagings Plain films and CT images have been personally visualized and interpreted; radiology reports have been reviewed. Decision making incorporated into the Impression / Recommendations.  CT GUIDED PERITONEAL/RETROPERITONEAL FLUID DRAIN BY PERC CATH Result Date: 11/11/2023 INDICATION: Pelvic diverticular abscess EXAM: CT-guided drainage catheter placement TECHNIQUE: Multidetector CT imaging of the pelvis was performed for evaluation and to allow targeting of the  central diverticular abscess. RADIATION DOSE REDUCTION: This exam was performed according to the departmental dose-optimization program which includes automated exposure control, adjustment of the mA and/or kV according to patient size and/or use of iterative reconstruction technique. MEDICATIONS: The patient is currently admitted to the hospital and receiving intravenous antibiotics. The antibiotics were administered within an appropriate time frame prior to the initiation of the procedure. ANESTHESIA/SEDATION: Moderate (conscious) sedation was employed during this procedure. A total of Versed  2 mg and Fentanyl  100 mcg was administered intravenously by the radiology nurse. Total intra-service moderate Sedation Time: 27 minutes. The patient's level of consciousness and vital signs were monitored continuously by radiology nursing throughout the procedure under my direct supervision. COMPLICATIONS: None immediate. PROCEDURE: Informed written consent was obtained from the patient after a thorough discussion of the procedural risks, benefits and alternatives. All questions were addressed. Maximal Sterile Barrier Technique was utilized including caps, mask, sterile gowns, sterile gloves, sterile drape, hand hygiene and skin antiseptic. A timeout was performed prior to the initiation of the procedure. In a supine position, radiopaque markers were placed on the patient's skin and the pelvis was imaged using CT guidance. The skin was then marked and sterilely prepped with ChloraPrep. Local anesthesia was achieved with 1% lidocaine . Lidocaine  was infiltrated from the skin to the abdominal rectus. A 7 cm Yueh needle was then advanced from the skin incision to the midpoint of the pelvic abscess. The needle was then withdrawn leaving the sheath. An Amplatz wire was then advanced through the sheath. Access was then dilated using a 10 French fascial dilator. The dilator was removed and a 10 French pigtail catheter was advanced  over the guidewire and coiled within the abscess. Guidewire was removed. Retention suture and sterile dressing were applied. The catheter was connected to a JP bulb. IMPRESSION: Satisfactory placement of a 10 French pelvic abscess drain. Electronically Signed   By: Susan Ensign   On: 11/11/2023 15:52   CT ABDOMEN PELVIS W CONTRAST Result Date: 11/11/2023 CLINICAL DATA:  Left lower quadrant abdominal pain for 1 week. Worsens with bowel movement. History of diverticulitis. EXAM: CT ABDOMEN AND PELVIS WITH CONTRAST TECHNIQUE: Multidetector CT imaging of the abdomen and pelvis was performed using the standard protocol following bolus administration of intravenous contrast. RADIATION DOSE REDUCTION: This exam was performed according to the departmental dose-optimization program which includes automated exposure control, adjustment of the mA and/or kV according to patient size and/or use of iterative reconstruction technique. CONTRAST:  OMNIPAQUE  IOHEXOL  300 MG/ML  SOLN COMPARISON:  CT abdomen pelvis 11/18/2022 FINDINGS: Lower chest: No acute abnormality. Hepatobiliary: No focal hepatic lesion. Fluid and gas containing structure in the porta hepatis may be a remnant gallbladder stump as this communicates with the  biliary tree. This is unchanged from 11/18/2022. Per chart review the patient is status post cholecystectomy. Pneumobilia. Unchanged prominence of the common bile duct measuring 10 mm in diameter. Pancreas: Unremarkable. Spleen: Unremarkable. Adrenals/Urinary Tract: Normal adrenal glands. Punctate nonobstructing stones bilaterally. No hydronephrosis. Urothelial thickening about the mid left ureter is likely reactive secondary to adjacent diverticulitis. Gas is present within the bladder. Bladder wall thickening in the dome of the bladder. Stomach/Bowel: Stomach is within normal limits. Duodenal diverticulum. No bowel obstruction. Normal appendix. Extensive sigmoid and descending colon diverticulosis.  There is marked wall thickening with adjacent inflammatory fat stranding and pericolonic fluid about the sigmoid colon. Peripherally enhancing irregular gas and fluid collection between the inflamed portions of the sigmoid colon in the dome of the bladder measuring 4.4 x 3.3 x 2.4 cm (series 2/image 69 and 7/69). Fluid-filled tracts extend between the sigmoid colon in the bladder. Along with the gas in the bladder this is highly suspicious for colovesical fistula with possible additional colocolic fistula. The gas and fluid collection extends posteriorly to the vaginal cuff (series 9/image 98). Small locule of gas in the vagina. Colovaginal fistula is difficult to exclude. Vascular/Lymphatic: Aortic atherosclerosis. No enlarged abdominal or pelvic lymph nodes. Reproductive: Hysterectomy. See above for discussion about possible colo vaginal fistula. No adnexal mass. Other: Ventral abdominal wall hernia repair. Musculoskeletal: No acute fracture. IMPRESSION: Complicated sigmoid diverticulitis with 4.4 cm gas containing abscess between the sigmoid colon and bladder. 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 Soft tissue tracts extend between portions of the sigmoid colon as well as to the vaginal cuff suspicious for additional colocolic and colovaginal fistulas. Aortic Atherosclerosis (ICD10-I70.0). Electronically Signed   By: Rozell Cornet M.D.   On: 11/11/2023 01:51    I have personally spent 85 minutes involved in face-to-face and non-face-to-face activities for this patient on the day of the visit. Professional time spent includes the following activities: Preparing to see the patient (review of tests), Obtaining and/or reviewing separately obtained history (admission/discharge record), Performing a medically appropriate examination and/or evaluation , Ordering medications/tests/procedures, referring and communicating with other health care professionals, Documenting clinical information  in the EMR, Independently interpreting results (not separately reported), Communicating results to the patient/family/caregiver, Counseling and educating the patient/family/caregiver and Care coordination (not separately reported).  Electronically signed by:   Plan d/w requesting provider as well as ID pharm D  Of note, portions of this note may have been created with voice recognition software. While this note has been edited for accuracy, occasional wrong-word or 'sound-a-like' substitutions may have occurred due to the inherent limitations of voice recognition software.   Terre Ferri, MD Infectious Disease Physician [facility] Pager: [phone]

## 2023-11-15 ENCOUNTER — Inpatient Hospital Stay (HOSPITAL_COMMUNITY)

## 2023-11-15 DIAGNOSIS — K5720 Diverticulitis of large intestine with perforation and abscess without bleeding: Secondary | ICD-10-CM | POA: Diagnosis not present

## 2023-11-15 DIAGNOSIS — E785 Hyperlipidemia, unspecified: Secondary | ICD-10-CM | POA: Diagnosis not present

## 2023-11-15 DIAGNOSIS — K2100 Gastro-esophageal reflux disease with esophagitis, without bleeding: Secondary | ICD-10-CM | POA: Diagnosis not present

## 2023-11-15 DIAGNOSIS — K5792 Diverticulitis of intestine, part unspecified, without perforation or abscess without bleeding: Secondary | ICD-10-CM | POA: Diagnosis not present

## 2023-11-15 LAB — CBC
HCT: 38.3 % (ref 36.0–46.0)
Hemoglobin: 11.9 g/dL — ABNORMAL LOW (ref 12.0–15.0)
MCH: 29.1 pg (ref 26.0–34.0)
MCHC: 31.1 g/dL (ref 30.0–36.0)
MCV: 93.6 fL (ref 80.0–100.0)
Platelets: 595 10*3/uL — ABNORMAL HIGH (ref 150–400)
RBC: 4.09 MIL/uL (ref 3.87–5.11)
RDW: 14.5 % (ref 11.5–15.5)
WBC: 15.5 10*3/uL — ABNORMAL HIGH (ref 4.0–10.5)
nRBC: 0 % (ref 0.0–0.2)

## 2023-11-15 LAB — RENAL FUNCTION PANEL
Albumin: 2.6 g/dL — ABNORMAL LOW (ref 3.5–5.0)
Anion gap: 9 (ref 5–15)
BUN: <5 mg/dL — ABNORMAL LOW (ref 8–23)
CO2: 23 mmol/L (ref 22–32)
Calcium: 8.5 mg/dL — ABNORMAL LOW (ref 8.9–10.3)
Chloride: 107 mmol/L (ref 98–111)
Creatinine, Ser: 0.68 mg/dL (ref 0.44–1.00)
GFR, Estimated: >60 mL/min (ref >60)
Glucose, Bld: 93 mg/dL (ref 70–99)
Phosphorus: 3.4 mg/dL (ref 2.5–4.6)
Potassium: 3.6 mmol/L (ref 3.5–5.1)
Sodium: 139 mmol/L (ref 135–145)

## 2023-11-15 MED ORDER — LACTULOSE 10 GM/15ML PO SOLN: 20.0000 g | Freq: Every day | ORAL | Status: DC | PRN

## 2023-11-15 MED ORDER — IOHEXOL 300 MG/ML  SOLN
100.0000 mL | Freq: Once | INTRAMUSCULAR | Status: AC | PRN
  Administered 2023-11-15: 100 mL via INTRAVENOUS

## 2023-11-15 NOTE — Progress Notes (Signed)
 [facility]                                                                              Amran Marren, is a 68 y.o. female, DOB - 27-Aug-1955, MWU:[id number] Admit date - 11/10/2023    Outpatient Primary MD for the patient is [name], [name], PA-C  LOS - 4  days  Chief Complaint  Patient presents with   Abdominal Pain       Brief summary   Patient is a 68 y.o. female with longstanding recurrent diverticulitis with known dense colonic diverticulosis, prior transverse colon perforation requiring colostomy and subsequent takedown presented with abdominal pain. Patient states that she was hospitalized in [place] until 4/14 due to acute diverticulitis and was later diagnosed with UTI and has been on ciprofloxacin .  However despite this she has had continued dysuria, urgency and frequency of urination, along with what feels like air coming out of her bladder.  She also has had continued left lower quadrant abdominal pain, which never completely resolved. She has recently met Dr. Dorrie Gaudier of [facility] for outpatient consultation, hence presented to ED here.  She denied any fevers, diarrhea, had some mild nausea but no vomiting.  She has also seen GI outpatient.  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 Recently admitted at [facility] about 3 weeks ago, with sepsis secondary to diverticulitis, improved with antibiotics. - s/p perc drain placed 5/7, per surgery, no current indication for emergent surgery.   - Diet advanced and tolerating solid diet, Xarelto  resumed - Patient was placed on IV Zosyn , abscess cultures + E. Coli - Reviewed ID recommendations, I had discussed  with Dr. Alethea Andes and Dr. Ramiro Burly this morning and was recommended no need for urology and GYN input at this time. Even though CT abdomen had shown concerns for colovesical and colovaginal fistula, these are not acutely treated inpatient and will be addressed by colorectal surgery at the follow-up appointment.  I had also relayed this recommendation from surgery service to the ID physician, Dr Gillian Lacrosse. - Discussed with surgery, per Dr. Alethea Andes, minimal drain output however is still purulent and recommended to continue IV antibiotics.  Placed IR consult for drain study and repeat CT abdomen.  - Patient has chronic leukocytosis and is followed by hematology in [place]   Hypertension - BP stable, continue norvasc    Paroxysmal atrial fibrillation - continue Toprol -XL, amiodarone . - Continue Xarelto    Hyperlipidemia - Continue Lipitor  Hypokalemia - Repeat days as needed  Constipation - Continue bowel regimen  Chronic leukocytosis - Per patient, she has a longstanding history of chronic leukocytosis and follows hematology outpatient in [place], [place]  Estimated body mass index is 28 kg/m as calculated from the following:   Height as of this encounter: 5\' 3"  (1.6 m).   Weight as of this encounter: 71.7 kg.  Code Status: Full code DVT Prophylaxis:  rivaroxaban  (  XARELTO ) tablet 20 mg Start: 11/14/23 1200 SCDs Start: 11/11/23 0310 rivaroxaban  (XARELTO ) tablet 20 mg   Level of Care: Level of care: Med-Surg Family Communication: Updated patient's husband at the bedside Disposition Plan:      Remains inpatient appropriate:    Procedures:  Percutaneous drain 5/7  Consultants:    IR Gen surgery  - ID  Antimicrobials:   Anti-infectives (From admission, onward)    Start     Dose/Rate Route Frequency Ordered Stop   11/11/23 0800  piperacillin -tazobactam (ZOSYN ) IVPB 3.375 g        3.375 g 12.5 mL/hr over 240 Minutes Intravenous Every 8 hours 11/11/23 0313     11/11/23 0200   piperacillin -tazobactam (ZOSYN ) IVPB 3.375 g        3.375 g 100 mL/hr over 30 Minutes Intravenous  Once 11/11/23 0159 11/11/23 0329          Medications  amiodarone   200 mg Oral Daily   atorvastatin   80 mg Oral Daily   fluticasone  1 spray Each Nare Daily   loratadine  10 mg Oral Daily   magnesium citrate  1 Bottle Oral Once   metoprolol  succinate  50 mg Oral BID   polyethylene glycol  17 g Oral Daily   predniSONE   5 mg Oral Q breakfast   rivaroxaban   20 mg Oral Q lunch   senna-docusate  1 tablet Oral BID   sodium chloride  flush  5 mL Intracatheter Q8H      Subjective:   Olivia [name] was seen and examined today.  No acute complaints per the patient, clinically improving, tolerating solid diet.  Constipation.  Husband at the bedside.    Objective:   Vitals:   11/14/23 0558 11/14/23 1343 11/14/23 2120 11/15/23 0521  BP:  135/62 132/61 132/63  Pulse:  63 67 70  Resp:  20 16 16   Temp:  97.6 F (36.4 C) 98.1 F (36.7 C) 98.6 F (37 C)  TempSrc:   Oral Oral  SpO2:  100% 96% 97%  Weight: 71.7 kg     Height:        Intake/Output Summary (Last 24 hours) at 11/15/2023 1020 Last data filed at 11/15/2023 0800 Gross per 24 hour  Intake 840.04 ml  Output 1257.5 ml  Net -417.46 ml     Wt Readings from Last 3 Encounters:  11/14/23 71.7 kg  11/02/23 72.8 kg  07/03/23 73.5 kg    Physical Exam General: Alert and oriented x 3, NAD Cardiovascular: S1 S2 clear, RRR.  Respiratory: CTAB Gastrointestinal: Soft, perc drain, nontender, NBS Ext: no pedal edema bilaterally Neuro: no new deficits Psych: Normal affect      Data Reviewed:  I have personally reviewed following labs    CBC Lab Results  Component Value Date   WBC 15.5 (H) 11/15/2023   RBC 4.09 11/15/2023   HGB 11.9 (L) 11/15/2023   HCT 38.3 11/15/2023   MCV 93.6 11/15/2023   MCH 29.1 11/15/2023   PLT 595 (H) 11/15/2023   MCHC 31.1 11/15/2023   RDW 14.5 11/15/2023   LYMPHSABS 3.3 11/11/2023    MONOABS 1.8 (H) 11/11/2023   EOSABS 0.0 11/11/2023   BASOSABS 0.1 11/11/2023     Last metabolic panel Lab Results  Component Value Date   NA 139 11/15/2023   K 3.6 11/15/2023   CL 107 11/15/2023   CO2 23 11/15/2023   BUN <5 (L) 11/15/2023   CREATININE 0.68 11/15/2023   GLUCOSE 93 11/15/2023   GFRNONAA >  60 11/15/2023   GFRAA >60 01/03/2020   CALCIUM  8.5 (L) 11/15/2023   PHOS 3.4 11/15/2023   PROT 5.5 (L) 11/12/2023   ALBUMIN 2.6 (L) 11/15/2023   LABGLOB 3.0 12/07/2019   AGRATIO 1.4 12/07/2019   BILITOT 0.7 11/12/2023   ALKPHOS 52 11/12/2023   AST 18 11/12/2023   ALT 17 11/12/2023   ANIONGAP 9 11/15/2023    CBG (last 3)  No results for input(s): "GLUCAP" in the last 72 hours.    Coagulation Profile: Recent Labs  Lab 11/11/23 0512  INR 1.0     Radiology Studies: I have personally reviewed the imaging studies  No results found.      Bertram Brocks M.D. [facility] 11/15/2023, 10:20 AM  Available via Epic secure chat 7am-7pm After 7 pm, please refer to night coverage provider listed on amion.

## 2023-11-15 NOTE — Progress Notes (Signed)
   Subjective/Chief Complaint: No complaints   Objective: Vital signs in last 24 hours: Temp:  [97.6 F (36.4 C)-98.6 F (37 C)] 98.6 F (37 C) (05/11 0521) Pulse Rate:  [63-70] 70 (05/11 0521) Resp:  [16-20] 16 (05/11 0521) BP: (132-135)/(61-63) 132/63 (05/11 0521) SpO2:  [96 %-100 %] 97 % (05/11 0521) Last BM Date : 11/14/23  Intake/Output from previous day: 05/10 0701 - 05/11 0700 In: 720 [P.O.:470; IV Piggyback:250] Out: 1155 [Urine:1150; Drains:5] Intake/Output this shift: Total I/O In: 120 [P.O.:120] Out: 102.5 [Urine:100; Drains:2.5]  General appearance: alert and cooperative Resp: clear to auscultation bilaterally Cardio: regular rate and rhythm GI: soft, nontender  Lab Results:  Recent Labs    11/14/23 0539 11/15/23 0530  WBC 12.1* 15.5*  HGB 11.8* 11.9*  HCT 35.9* 38.3  PLT 561* 595*   BMET Recent Labs    11/14/23 0539 11/15/23 0530  NA 139 139  K 3.1* 3.6  CL 106 107  CO2 23 23  GLUCOSE 99 93  BUN <5* <5*  CREATININE 0.63 0.68  CALCIUM  8.3* 8.5*   PT/INR No results for input(s): "LABPROT", "INR" in the last 72 hours. ABG No results for input(s): "PHART", "HCO3" in the last 72 hours.  Invalid input(s): "PCO2", "PO2"  Studies/Results: No results found.  Anti-infectives: Anti-infectives (From admission, onward)    Start     Dose/Rate Route Frequency Ordered Stop   11/11/23 0800  piperacillin -tazobactam (ZOSYN ) IVPB 3.375 g        3.375 g 12.5 mL/hr over 240 Minutes Intravenous Every 8 hours 11/11/23 0313     11/11/23 0200  piperacillin -tazobactam (ZOSYN ) IVPB 3.375 g        3.375 g 100 mL/hr over 30 Minutes Intravenous  Once 11/11/23 0159 11/11/23 0329       Assessment/Plan: s/p * No surgery found * Advance diet Continue drain and abx F/u with Dr. 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 - S/p IR drain 5/7. Cx pending.  - No current indication for emergency surgery - Cont IV abx - Hopefully patient will improve with conservative treatment.  If patient fails to improve they may require repeating imaging, drain placement, or surgical intervention resulting in a colectomy/colostomy.  This was discussed with the patient.  - Clinically improving. f/u with IR and Dr. Dorrie Gaudier in the office. To note she reports she has a baseline leukocytosis of 17-18 of unclear etiology that she follows w/ hematology for at [facility].  - We will follow with you   FEN - Soft, IVF per primary  VTE - SCDs, heparin  gtt. Last dose of Xarelto  for 5/5 PM ID - Zosyn   LOS: 4 days    Lillette Reid III 11/15/2023

## 2023-11-15 NOTE — Progress Notes (Signed)
 Referring Provider(s):  Dr. Camelia Cavalier, MD Dr. Bertram Brocks, MD  Supervising Physician: Erica Hau  Patient Status:  [facility] - In-pt  Chief Complaint: Acute diverticulitis.  Subjective:  Patient alert and laying in bed,calm. Significant other and daughter at bedside. Currently without any significant complaints. Patient is asking about possible discharge date. She denies any fevers, headache, chest pain, SOB, cough, abdominal pain, nausea, vomiting or bleeding.   IR was requested to evaluate line with possible line injection, as it has been charted with minimal output for >48 hours.  Allergies: Patient has no known allergies.  Medications: Prior to Admission medications   Medication Sig Start Date End Date Taking? Authorizing Provider  amiodarone  (PACERONE ) 200 MG tablet Take 200 mg by mouth at bedtime.   Yes [provider]  amLODipine  (NORVASC ) 5 MG tablet Take 5 mg by mouth 2 (two) times daily.   Yes [provider]  atorvastatin  (LIPITOR) 80 MG tablet Take 80 mg by mouth at bedtime.   Yes [provider]  calcium -vitamin D (OSCAL WITH D) 500-5 MG-MCG tablet Take 1 tablet by mouth.   Yes [provider]  Cyanocobalamin (VITAMIN B 12 PO) Place 3,000 mcg under the tongue 3 (three) times a week.   Yes [provider]  Cyanocobalamin (VITAMIN B-12 IJ) Inject 1,000 mcg into the muscle once a week. Wednesday   Yes [provider]  Dexlansoprazole  (DEXILANT ) 30 MG capsule DR Take 30 mg by mouth daily as needed (acid reflux).   Yes [provider]  ergocalciferol (VITAMIN D2) 1.25 MG (50000 UT) capsule Take 50,000 Units by mouth once a week. wednesday   Yes [provider]  Magnesium Citrate (MAGNESIUM GUMMIES PO) Take 2 tablets by mouth daily.   Yes [provider]  metoprolol  succinate (TOPROL -XL) 50 MG 24 hr tablet Take 50 mg by mouth 2 (two) times daily. 12/12/22  Yes [provider]   predniSONE  (DELTASONE ) 5 MG tablet Take 5 mg by mouth daily. 05/12/20  Yes [provider]  SENNA-TIME 8.6 MG tablet Take 2 tablets by mouth as needed for constipation.   Yes [provider]  sulfaSALAzine (AZULFIDINE) 500 MG tablet Take 500 mg by mouth daily. 01/12/23  Yes [provider]  XARELTO  20 MG TABS tablet Take 20 mg by mouth daily. 12/12/22  Yes [provider]  alendronate (FOSAMAX) 70 MG tablet Take 70 mg by mouth every Monday. Take with a full glass of water  on an empty stomach. Patient not taking: Reported on 11/11/2023    [provider]     Vital Signs: BP (!) 126/92 (BP Location: Left Arm)   Pulse 64   Temp 98 F (36.7 C) (Oral)   Resp 18   Ht 5\' 3"  (1.6 m)   Wt 158 lb 1.1 oz (71.7 kg)   SpO2 96%   BMI 28.00 kg/m   Physical Exam Constitutional:      Appearance: Normal appearance.  Cardiovascular:     Rate and Rhythm: Normal rate.  Pulmonary:     Effort: Pulmonary effort is normal.  Abdominal:     Comments: Midline lower abdomen drain appropriately dressed. Dressing is clean, dry, intact. Drain incision site non-tender, without evidence of infection. Retaining suture and Stat-Lock in place, though drain was loose from Stat-Lock. It was secured in place again. Line flushes well. 10 cc of purulent output in collection JP blub.    Musculoskeletal:        General: Normal  range of motion.  Skin:    General: Skin is warm and dry.  Neurological:     Mental Status: She is alert and oriented to person, place, and time.     Labs:  CBC: Recent Labs    11/12/23 0451 11/13/23 0452 11/14/23 0539 11/15/23 0530  WBC 13.5* 16.0* 12.1* 15.5*  HGB 11.4* 12.7 11.8* 11.9*  HCT 36.8 40.7 35.9* 38.3  PLT 512* 563* 561* 595*    COAGS: Recent Labs    11/11/23 0512 11/12/23 1856  INR 1.0  --   APTT  --  48*    BMP: Recent Labs    11/12/23 0451 11/13/23 0452 11/14/23 0539 11/15/23 0530  NA 140 139 139 139  K 3.9  3.7 3.1* 3.6  CL 108 103 106 107  CO2 26 27 23 23   GLUCOSE 86 103* 99 93  BUN 7* 5* <5* <5*  CALCIUM  8.4* 8.6* 8.3* 8.5*  CREATININE 0.80 0.82 0.63 0.68  GFRNONAA >60 >60 >60 >60    LIVER FUNCTION TESTS: Recent Labs    07/03/23 1416 11/10/23 1939 11/11/23 0512 11/12/23 0451 11/13/23 0452 11/14/23 0539 11/15/23 0530  BILITOT 0.6 0.6 0.6 0.7  --   --   --   AST 18 18 14* 18  --   --   --   ALT 21 18 15 17   --   --   --   ALKPHOS 65 66 57 52  --   --   --   PROT 6.8 7.5 6.2* 5.5*  --   --   --   ALBUMIN 3.4* 3.3* 2.8* 2.4* 2.8* 2.5* 2.6*    Assessment and Plan: Drain Location: suprapubic Size: Fr size: 10 Fr Date of placement: 11/11/23  Currently to: Drain collection device: suction bulb 24 hour output:  Output by Drain (mL) 11/13/23 0701 - 11/13/23 1900 11/13/23 1901 - 11/14/23 0700 11/14/23 0701 - 11/14/23 1900 11/14/23 1901 - 11/15/23 0700 11/15/23 0701 - 11/15/23 1532  Closed System Drain 1 Midline Abdomen Bulb (JP) 10 Fr. 5 0 0 5 2.5    Interval imaging/drain manipulation:  CT A/P w/ CM today - pending final read.   Current examination: Insertion site unremarkable. Suture and stat lock in place. Dressed appropriately.  Line flushes well. 10 cc of purulent output in collection JP blub.   Plan: IR was requested to evaluate line with possible line injection, as it has been charted with minimal output for >48 hours.  Reviewed CT AP done this afternoon, along with findings and presentation, with IR attending, Dr. Nereida Banning, who recommended withholding drain line injection as it is too early to do so close to placement. Pulling the drain this early will also likely lead to fistula formation this early on a diverticular abscess.  Recommendation for maintaining TID drain line flushes with 5cc NS, Record output Q shift. Dressing changes QD or PRN if soiled. \ Call IR APP or on call IR MD if difficulty flushing or sudden change in drain output.   Line injection can be  considered some time later this week under Fluoroscopy if outpatient, or in IR if inpatient. Please re-engage IR with new order at that time.  IR will continue to follow - please call with questions or concerns.     Thank you for this interesting consult.  I greatly enjoyed meeting Summah Sturdevant and look forward to participating in their care.   Electronically Signed: Lovena Rubinstein, PA-C 11/15/2023, 3:11 PM  I spent a total of 15 Minutes at the the patient's bedside AND on the patient's hospital floor or unit, greater than 50% of which was counseling/coordinating care for Acute diverticulitis, status post drain placement.

## 2023-11-15 NOTE — Plan of Care (Signed)

## 2023-11-16 DIAGNOSIS — D649 Anemia, unspecified: Secondary | ICD-10-CM | POA: Diagnosis not present

## 2023-11-16 DIAGNOSIS — K5792 Diverticulitis of intestine, part unspecified, without perforation or abscess without bleeding: Secondary | ICD-10-CM | POA: Diagnosis not present

## 2023-11-16 DIAGNOSIS — K5720 Diverticulitis of large intestine with perforation and abscess without bleeding: Secondary | ICD-10-CM | POA: Diagnosis not present

## 2023-11-16 DIAGNOSIS — K2100 Gastro-esophageal reflux disease with esophagitis, without bleeding: Secondary | ICD-10-CM | POA: Diagnosis not present

## 2023-11-16 LAB — RENAL FUNCTION PANEL
Albumin: 2.8 g/dL — ABNORMAL LOW (ref 3.5–5.0)
Anion gap: 9 (ref 5–15)
BUN: 5 mg/dL — ABNORMAL LOW (ref 8–23)
CO2: 24 mmol/L (ref 22–32)
Calcium: 8.4 mg/dL — ABNORMAL LOW (ref 8.9–10.3)
Chloride: 102 mmol/L (ref 98–111)
Creatinine, Ser: 0.79 mg/dL (ref 0.44–1.00)
GFR, Estimated: >60 mL/min (ref >60)
Glucose, Bld: 90 mg/dL (ref 70–99)
Phosphorus: 3.2 mg/dL (ref 2.5–4.6)
Potassium: 3.3 mmol/L — ABNORMAL LOW (ref 3.5–5.1)
Sodium: 135 mmol/L (ref 135–145)

## 2023-11-16 LAB — CBC
HCT: 38.3 % (ref 36.0–46.0)
Hemoglobin: 11.9 g/dL — ABNORMAL LOW (ref 12.0–15.0)
MCH: 29 pg (ref 26.0–34.0)
MCHC: 31.1 g/dL (ref 30.0–36.0)
MCV: 93.4 fL (ref 80.0–100.0)
Platelets: 623 10*3/uL — ABNORMAL HIGH (ref 150–400)
RBC: 4.1 MIL/uL (ref 3.87–5.11)
RDW: 14.8 % (ref 11.5–15.5)
WBC: 16.3 10*3/uL — ABNORMAL HIGH (ref 4.0–10.5)
nRBC: 0 % (ref 0.0–0.2)

## 2023-11-16 MED ORDER — METRONIDAZOLE 500 MG PO TABS
500.0000 mg | ORAL_TABLET | Freq: Two times a day (BID) | ORAL | Status: DC
  Administered 2023-11-16 – 2023-11-18 (×5): 500 mg via ORAL
  Filled 2023-11-16 (×5): qty 1

## 2023-11-16 MED ORDER — SODIUM CHLORIDE 0.9 % IV SOLN
2.0000 g | Freq: Every day | INTRAVENOUS | Status: DC
  Administered 2023-11-16 – 2023-11-18 (×3): 2 g via INTRAVENOUS
  Filled 2023-11-16 (×3): qty 20

## 2023-11-16 MED ORDER — POTASSIUM CHLORIDE CRYS ER 20 MEQ PO TBCR
40.0000 meq | EXTENDED_RELEASE_TABLET | Freq: Once | ORAL | Status: AC
  Administered 2023-11-16: 40 meq via ORAL
  Filled 2023-11-16: qty 2

## 2023-11-16 NOTE — Progress Notes (Signed)
 Referring Physician(s): Burton,V  Supervising Physician: Art Largo  Patient Status:  [facility] - In-pt  Chief Complaint: Recent left lower abdominal pain, pneumaturia, diverticular abscess with possible colovesical fistula; s/p lower abdominal drain placement 5/7    Subjective: Pt doing ok this pm; denies fever, worsening abd pain,N/V   Allergies: Patient has no known allergies.  Medications: Prior to Admission medications   Medication Sig Start Date End Date Taking? Authorizing Provider  amiodarone  (PACERONE ) 200 MG tablet Take 200 mg by mouth at bedtime.   Yes [provider]  amLODipine  (NORVASC ) 5 MG tablet Take 5 mg by mouth 2 (two) times daily.   Yes [provider]  atorvastatin  (LIPITOR) 80 MG tablet Take 80 mg by mouth at bedtime.   Yes [provider]  calcium -vitamin D (OSCAL WITH D) 500-5 MG-MCG tablet Take 1 tablet by mouth.   Yes [provider]  Cyanocobalamin (VITAMIN B 12 PO) Place 3,000 mcg under the tongue 3 (three) times a week.   Yes [provider]  Cyanocobalamin (VITAMIN B-12 IJ) Inject 1,000 mcg into the muscle once a week. Wednesday   Yes [provider]  Dexlansoprazole  (DEXILANT ) 30 MG capsule DR Take 30 mg by mouth daily as needed (acid reflux).   Yes [provider]  ergocalciferol (VITAMIN D2) 1.25 MG (50000 UT) capsule Take 50,000 Units by mouth once a week. wednesday   Yes [provider]  Magnesium Citrate (MAGNESIUM GUMMIES PO) Take 2 tablets by mouth daily.   Yes [provider]  metoprolol  succinate (TOPROL -XL) 50 MG 24 hr tablet Take 50 mg by mouth 2 (two) times daily. 12/12/22  Yes [provider]  predniSONE  (DELTASONE ) 5 MG tablet Take 5 mg by mouth daily. 05/12/20  Yes [provider]  SENNA-TIME 8.6 MG tablet Take 2 tablets by mouth as needed for constipation.   Yes [provider]  sulfaSALAzine (AZULFIDINE) 500 MG tablet Take 500 mg  by mouth daily. 01/12/23  Yes [provider]  XARELTO  20 MG TABS tablet Take 20 mg by mouth daily. 12/12/22  Yes [provider]  alendronate (FOSAMAX) 70 MG tablet Take 70 mg by mouth every Monday. Take with a full glass of water  on an empty stomach. Patient not taking: Reported on 11/11/2023    [provider]     Vital Signs: BP 114/81 (BP Location: Left Arm)   Pulse 73   Temp 99.3 F (37.4 C) (Oral)   Resp 18   Ht 5\' 3"  (1.6 m)   Wt 157 lb 13.6 oz (71.6 kg)   SpO2 97%   BMI 27.96 kg/m   Physical Exam awake, alert. Lower mid abdominal drain intact, dressing dry, not significantly tender to palpation, output about  20 cc of feculent appearing fluid yesterday, 5-10 cc today;  drain flushed without difficulty   Imaging: CT ABDOMEN PELVIS W CONTRAST Result Date: 11/15/2023 CLINICAL DATA:  Sigmoid diverticulitis with abscess formation and status post percutaneous catheter drainage diverticular abscess on 11/11/2023. EXAM: CT ABDOMEN AND PELVIS WITH CONTRAST TECHNIQUE: Multidetector CT imaging of the abdomen and pelvis was performed using the standard protocol following bolus administration of intravenous contrast. RADIATION DOSE REDUCTION: This exam was performed according to the departmental dose-optimization program which includes automated exposure control, adjustment of the mA and/or kV according to patient size and/or use of iterative reconstruction technique. CONTRAST:  OMNIPAQUE  IOHEXOL  300 MG/ML  SOLN COMPARISON:  11/11/2023 FINDINGS: Lower chest: No acute abnormality. Hepatobiliary: Unremarkable  liver. There is air in the biliary tree and gallbladder likely related to prior sphincterotomy. Pancreas: Unremarkable. No pancreatic ductal dilatation or surrounding inflammatory changes. Spleen: Normal in size without focal abnormality. Adrenals/Urinary Tract: Adrenal glands are unremarkable. Kidneys are normal, without renal calculi, focal lesion, or  hydronephrosis. Bladder is unremarkable. Stomach/Bowel: No bowel obstruction or significant ileus. Normal appendix. Inflammation of the sigmoid colon related to acute diverticulitis appears slightly improved compared to the prior study. Percutaneous drain is positioned within the diverticular abscess located just inferior to the sigmoid colon and superior to the bladder. The abscess has been completely decompressed with no significant fluid remaining. Vascular/Lymphatic: Stable atherosclerosis of the abdominal aorta without aneurysm. No lymphadenopathy identified. Reproductive: Status post hysterectomy. No adnexal masses. Other: Ventral abdominal wall hernia mesh without evidence abdominal wall hernia. No abdominopelvic ascites. Musculoskeletal: No acute or significant osseous findings. IMPRESSION: 1. Percutaneous drain is positioned within the diverticular abscess located just inferior to the sigmoid colon and superior to the bladder. The abscess has been completely decompressed with no significant fluid remaining. 2. Inflammation of the sigmoid colon related to acute diverticulitis appears slightly improved compared to the prior study. 3. Air in the biliary tree and gallbladder likely related to prior sphincterotomy. 4. Stable atherosclerosis of the abdominal aorta without aneurysm. 5. Ventral abdominal wall hernia mesh without evidence of abdominal wall hernia. Aortic Atherosclerosis (ICD10-I70.0). Electronically Signed   By: Erica Hau M.D.   On: 11/15/2023 16:00    Labs:  CBC: Recent Labs    11/13/23 0452 11/14/23 0539 11/15/23 0530 11/16/23 0433  WBC 16.0* 12.1* 15.5* 16.3*  HGB 12.7 11.8* 11.9* 11.9*  HCT 40.7 35.9* 38.3 38.3  PLT 563* 561* 595* 623*    COAGS: Recent Labs    11/11/23 0512 11/12/23 1856  INR 1.0  --   APTT  --  48*    BMP: Recent Labs    11/13/23 0452 11/14/23 0539 11/15/23 0530 11/16/23 0433  NA 139 139 139 135  K 3.7 3.1* 3.6 3.3*  CL 103 106 107 102   CO2 27 23 23 24   GLUCOSE 103* 99 93 90  BUN 5* <5* <5* 5*  CALCIUM  8.6* 8.3* 8.5* 8.4*  CREATININE 0.82 0.63 0.68 0.79  GFRNONAA >60 >60 >60 >60    LIVER FUNCTION TESTS: Recent Labs    07/03/23 1416 11/10/23 1939 11/11/23 0512 11/12/23 0451 11/13/23 0452 11/14/23 0539 11/15/23 0530 11/16/23 0433  BILITOT 0.6 0.6 0.6 0.7  --   --   --   --   AST 18 18 14* 18  --   --   --   --   ALT 21 18 15 17   --   --   --   --   ALKPHOS 65 66 57 52  --   --   --   --   PROT 6.8 7.5 6.2* 5.5*  --   --   --   --   ALBUMIN 3.4* 3.3* 2.8* 2.4* 2.8* 2.5* 2.6* 2.8*    Assessment and Plan: Patient with history of complicated sigmoid diverticulitis with abscess between sigmoid colon and bladder , gas within bladder concerning for colovesical fistula; status post mid lower abdominal drain placement on 5/7; temp 99.3, WBC 16.3(15.5), hgb stable, creat nl; drain fl cx- rare e coli; latest CT A/P yesterday:   1. Percutaneous drain is positioned within the diverticular abscess located just inferior to the sigmoid colon and superior to the bladder. The abscess has been  completely decompressed with no significant fluid remaining. 2. Inflammation of the sigmoid colon related to acute diverticulitis appears slightly improved compared to the prior study. 3. Air in the biliary tree and gallbladder likely related to prior sphincterotomy. 4. Stable atherosclerosis of the abdominal aorta without aneurysm. 5. Ventral abdominal wall hernia mesh without evidence of abdominal wall hernia.   Aortic Atherosclerosis  Output by Drain (mL) 11/14/23 0701 - 11/14/23 1900 11/14/23 1901 - 11/15/23 0700 11/15/23 0701 - 11/15/23 1900 11/15/23 1901 - 11/16/23 0700 11/16/23 0701 - 11/16/23 1454  Closed System Drain 1 Midline Abdomen Bulb (JP) 10 Fr. 0 5 12.5 5 5      Cont current tx; will plan to review latest imaging with IR MD; will likely need drain injection  Electronically Signed: D. Honore Lux,  PA-C 11/16/2023, 2:48 PM   I spent a total of 15 Minutes at the the patient's bedside AND on the patient's hospital floor or unit, greater than 50% of which was counseling/coordinating care for abdominal abscess drain     Patient ID: Olivia [name], female   DOB: [birth year], 68 y.o.   MRN: [id number]

## 2023-11-16 NOTE — Consult Note (Signed)
 [facility]  Date of Admission:  11/10/2023   Total days of inpatient antibiotics 6  Principal Problem:   Acute diverticulitis Active Problems:   Normocytic anemia   GERD (gastroesophageal reflux disease)   Dyslipidemia   Peripheral arterial disease (HCC)   Paroxysmal A-fib (HCC)          Assessment: # Complicated sigmoid diverticulitis with abscess, with multiple recurrences and prior h/o perforation and ostomy  - No intervention per surgery  - 5/7 CT guided JP drain placement. 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  No feasible p.o. antibiotics for discharge.  - Monitor CBC and CMP on abtx - Universal/standard isolation precautions   Recommendations: -Called lab for cefazolin  sens. If sens then will plan on about 4 weeks of cefaroxil +metro otherwise continue IV ceftiaxone and metro via picc.  Final antibiotic plan pending radiographic and clinical progression.  Will need repeat CT abdomen pelvis outpatient, stop antibiotics. -Urology and GYN involvement addressed outpatient - Universal precautions   Evaluation of this patient requires complex antimicrobial therapy evaluation and counseling + isolation needs for disease transmission risk assessment and mitigation   Microbiology:   Antibiotics: Piptazo 5/7- Cultures: Blood  Urine  Other 5/8 ecoli  SUBJECTIVE: Resting in bed. No new complaints. Husband at bedside Interval: Afebrile overnight. Wbc 16.3k  Review of Systems: Review of Systems  All other systems reviewed and are negative.    Scheduled Meds:  amiodarone   200 mg Oral  Daily   atorvastatin   80 mg Oral Daily   fluticasone  1 spray Each Nare Daily   loratadine  10 mg Oral Daily   metoprolol  succinate  50 mg Oral BID   metroNIDAZOLE  500 mg Oral Q12H   polyethylene glycol  17 g Oral Daily   predniSONE   5 mg Oral Q breakfast   rivaroxaban   20 mg Oral Q lunch   senna-docusate  1 tablet Oral BID   sodium chloride  flush  5 mL Intracatheter Q8H   Continuous Infusions:  cefTRIAXone (ROCEPHIN)  IV     PRN Meds:.acetaminophen  **OR** acetaminophen , albuterol , lactulose, morphine  injection, naLOXone  (NARCAN )  injection, ondansetron  (ZOFRAN ) IV, traZODone  No Known Allergies  OBJECTIVE: Vitals:   11/15/23 2144 11/16/23 0500 11/16/23 0551 11/16/23 0856  BP: 131/62  (!) 123/58 (!) 123/58  Pulse: 67  66 66  Resp: 16  15   Temp: 98.3 F (36.8 C)  98.4 F (36.9 C)   TempSrc: Oral  Oral   SpO2: 96%  97%   Weight:  71.6 kg    Height:       Body mass index is 27.96 kg/m.  Physical Exam Constitutional:      Appearance: Normal appearance.  HENT:     Head: Normocephalic and atraumatic.     Right Ear: Tympanic membrane normal.     Left Ear: Tympanic membrane normal.     Nose: Nose normal.     Mouth/Throat:     Mouth: Mucous membranes are moist.  Eyes:     Extraocular Movements: Extraocular movements intact.  Conjunctiva/sclera: Conjunctivae normal.     Pupils: Pupils are equal, round, and reactive to light.  Cardiovascular:     Rate and Rhythm: Normal rate and regular rhythm.     Heart sounds: No murmur heard.    No friction rub. No gallop.  Pulmonary:     Effort: Pulmonary effort is normal.     Breath sounds: Normal breath sounds.  Abdominal:     General: Abdomen is flat.     Palpations: Abdomen is soft.     Comments: Drain  Musculoskeletal:        General: Normal range of motion.  Skin:    General: Skin is warm and dry.  Neurological:     General: No focal deficit present.     Mental Status: She is alert and oriented to person, place,  and time.  Psychiatric:        Mood and Affect: Mood normal.       Lab Results Lab Results  Component Value Date   WBC 16.3 (H) 11/16/2023   HGB 11.9 (L) 11/16/2023   HCT 38.3 11/16/2023   MCV 93.4 11/16/2023   PLT 623 (H) 11/16/2023    Lab Results  Component Value Date   CREATININE 0.79 11/16/2023   BUN 5 (L) 11/16/2023   NA 135 11/16/2023   K 3.3 (L) 11/16/2023   CL 102 11/16/2023   CO2 24 11/16/2023    Lab Results  Component Value Date   ALT 17 11/12/2023   AST 18 11/12/2023   ALKPHOS 52 11/12/2023   BILITOT 0.7 11/12/2023        Orlie Bjornstad, MD [facility] 11/16/2023, 11:57 AM

## 2023-11-16 NOTE — Progress Notes (Signed)
 [facility]                                                                              Olivia [name], is a 68 y.o. female, DOB - 1956/05/12, QIO:[id number] Admit date - 11/10/2023    Outpatient Primary MD for the patient is Olivia [name], Olivia Milks, PA-C  LOS - 5  days  Chief Complaint  Patient presents with   Abdominal Pain       Brief summary   Patient is a 68 y.o. female with longstanding recurrent diverticulitis with known dense colonic diverticulosis, prior transverse colon perforation requiring colostomy and subsequent takedown presented with abdominal pain. Patient states that she was hospitalized in [place] until 4/14 due to acute diverticulitis and was later diagnosed with UTI and has been on ciprofloxacin .  However despite this she has had continued dysuria, urgency and frequency of urination, along with what feels like air coming out of her bladder.  She also has had continued left lower quadrant abdominal pain, which never completely resolved. She has recently met Dr. Dorrie Gaudier of [facility] for outpatient consultation, hence presented to ED here.  She denied any fevers, diarrhea, had some mild nausea but no vomiting.  She has also seen GI outpatient.  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 Recently admitted at [facility] about 3 weeks ago, with sepsis secondary to diverticulitis, improved with antibiotics. - s/p perc drain placed 5/7, per surgery, no current indication for emergent surgery.   - Xarelto  resumed - Patient was placed on IV Zosyn , abscess cultures  + E. coli with no p.o. options - discussed with Dr. Alethea Andes and Dr. Ramiro Burly on 5/11 and was recommended  no need for urology and GYN input at this time. Even though CT abdomen had shown concerns for colovesical and colovaginal fistula, these are not acutely treated inpatient and will be addressed by colorectal surgery at the f/u appointment.  Info relayed to ID -Repeat CT on 5/11 showed abscess completely decompressed, acute diverticulitis improving. 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 - Continue Xarelto    Hyperlipidemia - Continue Lipitor  Hypokalemia - Replaced  Constipation - Continue bowel regimen  Chronic leukocytosis - Per patient, she has a longstanding history of chronic leukocytosis and follows hematology outpatient in [place], [place]  Estimated body mass index is 27.96 kg/m as calculated from the following:   Height as of this encounter: 5\' 3"  (1.6 m).   Weight as of this encounter: 71.6 kg.  Code Status: Full code DVT Prophylaxis:  rivaroxaban  (XARELTO ) tablet 20 mg Start: 11/14/23 1200 SCDs Start: 11/11/23 0310 rivaroxaban  (XARELTO ) tablet 20 mg   Level of Care: Level of care: Med-Surg Family Communication: Updated patient's husband  at the bedside Disposition Plan:      Remains inpatient appropriate:    Procedures:  Percutaneous drain 5/7  Consultants:    IR Gen surgery  - ID  Antimicrobials:   Anti-infectives (From admission, onward)    Start     Dose/Rate Route Frequency Ordered Stop   11/16/23 2200  metroNIDAZOLE (FLAGYL) tablet 500 mg        500 mg Oral Every 12 hours 11/16/23 0851     11/16/23 1500  cefTRIAXone (ROCEPHIN) 2 g in sodium chloride  0.9 % 100 mL IVPB        2 g 200 mL/hr over 30 Minutes Intravenous Daily 11/16/23 0851     11/11/23 0800  piperacillin -tazobactam (ZOSYN ) IVPB 3.375 g  Status:  Discontinued        3.375 g 12.5 mL/hr  over 240 Minutes Intravenous Every 8 hours 11/11/23 0313 11/16/23 0851   11/11/23 0200  piperacillin -tazobactam (ZOSYN ) IVPB 3.375 g        3.375 g 100 mL/hr over 30 Minutes Intravenous  Once 11/11/23 0159 11/11/23 0329          Medications  amiodarone   200 mg Oral Daily   atorvastatin   80 mg Oral Daily   fluticasone  1 spray Each Nare Daily   loratadine  10 mg Oral Daily   metoprolol  succinate  50 mg Oral BID   metroNIDAZOLE  500 mg Oral Q12H   polyethylene glycol  17 g Oral Daily   predniSONE   5 mg Oral Q breakfast   rivaroxaban   20 mg Oral Q lunch   senna-docusate  1 tablet Oral BID   sodium chloride  flush  5 mL Intracatheter Q8H      Subjective:   Olivia [name] was seen and examined today.  Overall improving, no acute complaints.  No fever chills or worsening abdominal pain nausea or vomiting.  Husband at the bedside.    Objective:   Vitals:   11/16/23 0500 11/16/23 0551 11/16/23 0856 11/16/23 1341  BP:  (!) 123/58 (!) 123/58 114/81  Pulse:  66 66 73  Resp:  15  18  Temp:  98.4 F (36.9 C)  99.3 F (37.4 C)  TempSrc:  Oral  Oral  SpO2:  97%  97%  Weight: 71.6 kg     Height:        Intake/Output Summary (Last 24 hours) at 11/16/2023 1501 Last data filed at 11/16/2023 1445 Gross per 24 hour  Intake 934.89 ml  Output 2820 ml  Net -1885.11 ml     Wt Readings from Last 3 Encounters:  11/16/23 71.6 kg  11/02/23 72.8 kg  07/03/23 73.5 kg   Physical Exam General: Alert and oriented x 3, NAD Cardiovascular: S1 S2 clear, RRR.  Respiratory: CTAB, no wheezing Gastrointestinal: Soft,, NT, NBS, perc drain+ Ext: no pedal edema bilaterally Neuro: no new deficits Psych: Normal affect    Data Reviewed:  I have personally reviewed following labs    CBC Lab Results  Component Value Date   WBC 16.3 (H) 11/16/2023   RBC 4.10 11/16/2023   HGB 11.9 (L) 11/16/2023   HCT 38.3 11/16/2023   MCV 93.4 11/16/2023   MCH 29.0 11/16/2023   PLT 623 (H) 11/16/2023    MCHC 31.1 11/16/2023   RDW 14.8 11/16/2023   LYMPHSABS 3.3 11/11/2023   MONOABS 1.8 (H) 11/11/2023   EOSABS 0.0 11/11/2023   BASOSABS 0.1 11/11/2023     Last metabolic panel Lab Results  Component Value Date  NA 135 11/16/2023   K 3.3 (L) 11/16/2023   CL 102 11/16/2023   CO2 24 11/16/2023   BUN 5 (L) 11/16/2023   CREATININE 0.79 11/16/2023   GLUCOSE 90 11/16/2023   GFRNONAA >60 11/16/2023   GFRAA >60 01/03/2020   CALCIUM  8.4 (L) 11/16/2023   PHOS 3.2 11/16/2023   PROT 5.5 (L) 11/12/2023   ALBUMIN 2.8 (L) 11/16/2023   LABGLOB 3.0 12/07/2019   AGRATIO 1.4 12/07/2019   BILITOT 0.7 11/12/2023   ALKPHOS 52 11/12/2023   AST 18 11/12/2023   ALT 17 11/12/2023   ANIONGAP 9 11/16/2023    CBG (last 3)  No results for input(s): "GLUCAP" in the last 72 hours.    Coagulation Profile: Recent Labs  Lab 11/11/23 0512  INR 1.0     Radiology Studies: I have personally reviewed the imaging studies  CT ABDOMEN PELVIS W CONTRAST Result Date: 11/15/2023 CLINICAL DATA:  Sigmoid diverticulitis with abscess formation and status post percutaneous catheter drainage diverticular abscess on 11/11/2023. EXAM: CT ABDOMEN AND PELVIS WITH CONTRAST TECHNIQUE: Multidetector CT imaging of the abdomen and pelvis was performed using the standard protocol following bolus administration of intravenous contrast. RADIATION DOSE REDUCTION: This exam was performed according to the departmental dose-optimization program which includes automated exposure control, adjustment of the mA and/or kV according to patient size and/or use of iterative reconstruction technique. CONTRAST:  OMNIPAQUE  IOHEXOL  300 MG/ML  SOLN COMPARISON:  11/11/2023 FINDINGS: Lower chest: No acute abnormality. Hepatobiliary: Unremarkable liver. There is air in the biliary tree and gallbladder likely related to prior sphincterotomy. Pancreas: Unremarkable. No pancreatic ductal dilatation or surrounding inflammatory changes. Spleen: Normal  in size without focal abnormality. Adrenals/Urinary Tract: Adrenal glands are unremarkable. Kidneys are normal, without renal calculi, focal lesion, or hydronephrosis. Bladder is unremarkable. Stomach/Bowel: No bowel obstruction or significant ileus. Normal appendix. Inflammation of the sigmoid colon related to acute diverticulitis appears slightly improved compared to the prior study. Percutaneous drain is positioned within the diverticular abscess located just inferior to the sigmoid colon and superior to the bladder. The abscess has been completely decompressed with no significant fluid remaining. Vascular/Lymphatic: Stable atherosclerosis of the abdominal aorta without aneurysm. No lymphadenopathy identified. Reproductive: Status post hysterectomy. No adnexal masses. Other: Ventral abdominal wall hernia mesh without evidence abdominal wall hernia. No abdominopelvic ascites. Musculoskeletal: No acute or significant osseous findings. IMPRESSION: 1. Percutaneous drain is positioned within the diverticular abscess located just inferior to the sigmoid colon and superior to the bladder. The abscess has been completely decompressed with no significant fluid remaining. 2. Inflammation of the sigmoid colon related to acute diverticulitis appears slightly improved compared to the prior study. 3. Air in the biliary tree and gallbladder likely related to prior sphincterotomy. 4. Stable atherosclerosis of the abdominal aorta without aneurysm. 5. Ventral abdominal wall hernia mesh without evidence of abdominal wall hernia. Aortic Atherosclerosis (ICD10-I70.0). Electronically Signed   By: Erica Hau M.D.   On: 11/15/2023 16:00        Sapphire Tygart M.D. [facility] 11/16/2023, 3:01 PM  Available via Epic secure chat 7am-7pm After 7 pm, please refer to night coverage provider listed on amion.

## 2023-11-16 NOTE — TOC Progression Note (Addendum)
 Transition of Care [facility]) - Progression Note    Patient Details  Name: Olivia [name] MRN: [id number] Date of Birth: [birth year]  Transition of Care [facility]) CM/SW Contact  Bari Leys, RN Phone Number: 11/16/2023, 4:37 PM  Clinical Narrative:   Per ID progress note, plan for home iv abx plan for 4 weeks home iv abx via PICC line, final abx pending radiographic and clinical progression. Referral sent to [facility] with [facility].TOC will continue to follow.     Expected Discharge Plan: Home w Home Health Services    Expected Discharge Plan and Services       Living arrangements for the past 2 months: Single Family Home                                       Social Determinants of Health (SDOH) Interventions SDOH Screenings   Food Insecurity: No Food Insecurity (11/11/2023)  Housing: Low Risk  (11/11/2023)  Transportation Needs: No Transportation Needs (11/11/2023)  Utilities: Not At Risk (11/11/2023)  Social Connections: Socially Integrated (11/11/2023)  Tobacco Use: Medium Risk (11/10/2023)    Readmission Risk Interventions    11/11/2023    1:06 PM  Readmission Risk Prevention Plan  Post Dischage Appt Complete  Medication Screening Complete  Transportation Screening Complete

## 2023-11-16 NOTE — Progress Notes (Signed)
 Progress Note     Subjective: Pt denies abdominal pain. Drain with low volume seropurulent output. She is tolerating diet and having bowel function. She is not having symptoms of UTI or significant vaginal drainage.   Objective: Vital signs in last 24 hours: Temp:  [98 F (36.7 C)-98.4 F (36.9 C)] 98.4 F (36.9 C) (05/12 0551) Pulse Rate:  [64-67] 66 (05/12 0856) Resp:  [15-18] 15 (05/12 0551) BP: (123-131)/(58-92) 123/58 (05/12 0856) SpO2:  [96 %-97 %] 97 % (05/12 0551) Weight:  [71.6 kg] 71.6 kg (05/12 0500) Last BM Date : 11/14/23  Intake/Output from previous day: 05/11 0701 - 05/12 0700 In: 689.9 [P.O.:540; IV Piggyback:149.9] Out: 2317.5 [Urine:2300; Drains:17.5] Intake/Output this shift: Total I/O In: 240 [P.O.:240] Out: -   PE: General: pleasant, WD, WN female who is laying in bed in NAD Heart: regular, rate, and rhythm.   Lungs:  Respiratory effort nonlabored Abd: soft, NT, ND, drain with small volume seropurulent fluid Psych: A&Ox3 with an appropriate affect.    Lab Results:  Recent Labs    11/15/23 0530 11/16/23 0433  WBC 15.5* 16.3*  HGB 11.9* 11.9*  HCT 38.3 38.3  PLT 595* 623*   BMET Recent Labs    11/15/23 0530 11/16/23 0433  NA 139 135  K 3.6 3.3*  CL 107 102  CO2 23 24  GLUCOSE 93 90  BUN <5* 5*  CREATININE 0.68 0.79  CALCIUM  8.5* 8.4*   PT/INR No results for input(s): "LABPROT", "INR" in the last 72 hours. CMP     Component Value Date/Time   NA 135 11/16/2023 0433   NA 143 12/07/2019 0751   K 3.3 (L) 11/16/2023 0433   CL 102 11/16/2023 0433   CO2 24 11/16/2023 0433   GLUCOSE 90 11/16/2023 0433   BUN 5 (L) 11/16/2023 0433   BUN 15 12/07/2019 0751   CREATININE 0.79 11/16/2023 0433   CALCIUM  8.4 (L) 11/16/2023 0433   PROT 5.5 (L) 11/12/2023 0451   PROT 6.4 09/07/2020 1311   ALBUMIN 2.8 (L) 11/16/2023 0433   ALBUMIN 3.9 09/07/2020 1311   AST 18 11/12/2023 0451   ALT 17 11/12/2023 0451   ALKPHOS 52 11/12/2023 0451    BILITOT 0.7 11/12/2023 0451   BILITOT <0.2 09/07/2020 1311   GFRNONAA >60 11/16/2023 0433   GFRAA >60 01/03/2020 1357   Lipase     Component Value Date/Time   LIPASE 33 11/10/2023 1939       Studies/Results: CT ABDOMEN PELVIS W CONTRAST Result Date: 11/15/2023 CLINICAL DATA:  Sigmoid diverticulitis with abscess formation and status post percutaneous catheter drainage diverticular abscess on 11/11/2023. EXAM: CT ABDOMEN AND PELVIS WITH CONTRAST TECHNIQUE: Multidetector CT imaging of the abdomen and pelvis was performed using the standard protocol following bolus administration of intravenous contrast. RADIATION DOSE REDUCTION: This exam was performed according to the departmental dose-optimization program which includes automated exposure control, adjustment of the mA and/or kV according to patient size and/or use of iterative reconstruction technique. CONTRAST:  OMNIPAQUE  IOHEXOL  300 MG/ML  SOLN COMPARISON:  11/11/2023 FINDINGS: Lower chest: No acute abnormality. Hepatobiliary: Unremarkable liver. There is air in the biliary tree and gallbladder likely related to prior sphincterotomy. Pancreas: Unremarkable. No pancreatic ductal dilatation or surrounding inflammatory changes. Spleen: Normal in size without focal abnormality. Adrenals/Urinary Tract: Adrenal glands are unremarkable. Kidneys are normal, without renal calculi, focal lesion, or hydronephrosis. Bladder is unremarkable. Stomach/Bowel: No bowel obstruction or significant ileus. Normal appendix. Inflammation of the sigmoid colon related to  acute diverticulitis appears slightly improved compared to the prior study. Percutaneous drain is positioned within the diverticular abscess located just inferior to the sigmoid colon and superior to the bladder. The abscess has been completely decompressed with no significant fluid remaining. Vascular/Lymphatic: Stable atherosclerosis of the abdominal aorta without aneurysm. No lymphadenopathy  identified. Reproductive: Status post hysterectomy. No adnexal masses. Other: Ventral abdominal wall hernia mesh without evidence abdominal wall hernia. No abdominopelvic ascites. Musculoskeletal: No acute or significant osseous findings. IMPRESSION: 1. Percutaneous drain is positioned within the diverticular abscess located just inferior to the sigmoid colon and superior to the bladder. The abscess has been completely decompressed with no significant fluid remaining. 2. Inflammation of the sigmoid colon related to acute diverticulitis appears slightly improved compared to the prior study. 3. Air in the biliary tree and gallbladder likely related to prior sphincterotomy. 4. Stable atherosclerosis of the abdominal aorta without aneurysm. 5. Ventral abdominal wall hernia mesh without evidence of abdominal wall hernia. Aortic Atherosclerosis (ICD10-I70.0). Electronically Signed   By: Erica Hau M.D.   On: 11/15/2023 16:00    Anti-infectives: Anti-infectives (From admission, onward)    Start     Dose/Rate Route Frequency Ordered Stop   11/16/23 2200  metroNIDAZOLE (FLAGYL) tablet 500 mg        500 mg Oral Every 12 hours 11/16/23 0851     11/16/23 1500  cefTRIAXone (ROCEPHIN) 2 g in sodium chloride  0.9 % 100 mL IVPB        2 g 200 mL/hr over 30 Minutes Intravenous Daily 11/16/23 0851     11/11/23 0800  piperacillin -tazobactam (ZOSYN ) IVPB 3.375 g  Status:  Discontinued        3.375 g 12.5 mL/hr over 240 Minutes Intravenous Every 8 hours 11/11/23 0313 11/16/23 0851   11/11/23 0200  piperacillin -tazobactam (ZOSYN ) IVPB 3.375 g        3.375 g 100 mL/hr over 30 Minutes Intravenous  Once 11/11/23 0159 11/11/23 0329        Assessment/Plan  Sigmoid diverticulitis with abscess and possible colovaginal and colovesical fistula - CT w/ sigmoid diverticulitis with a 4.4 cm abscess between the sigmoid colon and bladder with findings also 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 - S/p IR  drain 5/7. Cx with E.coli with resistance to several abx, ID following   - No current indication for emergency surgery - Abx per ID - Clinically improving. f/u with IR and Dr. Dorrie Gaudier in the office. To note she reports she has a baseline leukocytosis of 17-18 of unclear etiology that she follows w/ hematology for at [facility].  - pt currently asymptomatic from colovaginal and colovesical fistula  - pt is stable for DC from a surgical perspective with outpatient follow up, general surgery will sign off at this time but please call if further questions or concerns    FEN - Soft, IVF per primary  VTE - SCDs, xarelto  resumed 5/10 ID - Zosyn  5/7>5/12; IV rocephin/flagyl 5/12>>  LOS: 5 days   I reviewed Consultant IR and ID notes, hospitalist notes, last 24 h vitals and pain scores, last 48 h intake and output, last 24 h labs and trends, and last 24 h imaging results.  This care required moderate level of medical decision making.    Annetta Killian, [facility] 11/16/2023, 10:35 AM Please see Amion for pager number during day hours 7:00am-4:30pm

## 2023-11-16 NOTE — Progress Notes (Signed)
 Mobility Specialist - Progress Note   11/16/23 0849  Mobility  Activity Ambulated independently in hallway  Level of Assistance Independent after set-up  Assistive Device None  Distance Ambulated (ft) 400 ft  Range of Motion/Exercises Active  Activity Response Tolerated well  Mobility Referral Yes  Mobility visit 1 Mobility  Mobility Specialist Start Time (ACUTE ONLY) [id number]  Mobility Specialist Stop Time (ACUTE ONLY) 0849  Mobility Specialist Time Calculation (min) (ACUTE ONLY) 10 min   Pt was found in bed and agreeable to ambulate. No complaints and returned to bed with all needs met. Call bell in reach and LPN in room.  Lorna Rose Mobility Specialist

## 2023-11-17 DIAGNOSIS — I480 Paroxysmal atrial fibrillation: Secondary | ICD-10-CM | POA: Diagnosis not present

## 2023-11-17 DIAGNOSIS — K5792 Diverticulitis of intestine, part unspecified, without perforation or abscess without bleeding: Secondary | ICD-10-CM | POA: Diagnosis not present

## 2023-11-17 DIAGNOSIS — K2100 Gastro-esophageal reflux disease with esophagitis, without bleeding: Secondary | ICD-10-CM | POA: Diagnosis not present

## 2023-11-17 DIAGNOSIS — K5720 Diverticulitis of large intestine with perforation and abscess without bleeding: Secondary | ICD-10-CM | POA: Diagnosis not present

## 2023-11-17 LAB — RENAL FUNCTION PANEL
Albumin: 2.8 g/dL — ABNORMAL LOW (ref 3.5–5.0)
Anion gap: 8 (ref 5–15)
BUN: 6 mg/dL — ABNORMAL LOW (ref 8–23)
CO2: 23 mmol/L (ref 22–32)
Calcium: 8.2 mg/dL — ABNORMAL LOW (ref 8.9–10.3)
Chloride: 104 mmol/L (ref 98–111)
Creatinine, Ser: 0.78 mg/dL (ref 0.44–1.00)
GFR, Estimated: >60 mL/min (ref >60)
Glucose, Bld: 66 mg/dL — ABNORMAL LOW (ref 70–99)
Phosphorus: 3 mg/dL (ref 2.5–4.6)
Potassium: 3.7 mmol/L (ref 3.5–5.1)
Sodium: 135 mmol/L (ref 135–145)

## 2023-11-17 LAB — URINALYSIS, W/ REFLEX TO CULTURE (INFECTION SUSPECTED)
Bilirubin Urine: NEGATIVE
Glucose, UA: NEGATIVE mg/dL
Ketones, ur: NEGATIVE mg/dL
Nitrite: NEGATIVE
Protein, ur: 30 mg/dL — AB
RBC / HPF: >50 RBC/hpf (ref 0–5)
Specific Gravity, Urine: 1.004 — ABNORMAL LOW (ref 1.005–1.030)
WBC, UA: >50 WBC/hpf (ref 0–5)
pH: 7 (ref 5.0–8.0)

## 2023-11-17 LAB — CBC
HCT: 40 % (ref 36.0–46.0)
Hemoglobin: 12.5 g/dL (ref 12.0–15.0)
MCH: 29.5 pg (ref 26.0–34.0)
MCHC: 31.3 g/dL (ref 30.0–36.0)
MCV: 94.3 fL (ref 80.0–100.0)
Platelets: 686 10*3/uL — ABNORMAL HIGH (ref 150–400)
RBC: 4.24 MIL/uL (ref 3.87–5.11)
RDW: 14.8 % (ref 11.5–15.5)
WBC: 19.5 10*3/uL — ABNORMAL HIGH (ref 4.0–10.5)
nRBC: 0 % (ref 0.0–0.2)

## 2023-11-17 MED ORDER — POLYETHYLENE GLYCOL 3350 17 G PO PACK: 17.0000 g | PACK | Freq: Every day | ORAL | Status: DC | PRN

## 2023-11-17 MED ORDER — TRAMADOL HCL 50 MG PO TABS
50.0000 mg | ORAL_TABLET | Freq: Four times a day (QID) | ORAL | Status: DC | PRN
  Administered 2023-11-17 – 2023-11-18 (×4): 50 mg via ORAL
  Filled 2023-11-17 (×4): qty 1

## 2023-11-17 NOTE — Progress Notes (Signed)
 [facility]                                                                              Olivia [name], is a 68 y.o. female, DOB - 03-Apr-1956, EXB:[id number] Admit date - 11/10/2023    Outpatient Primary MD for the patient is [name], [name], PA-C  LOS - 6  days  Chief Complaint  Patient presents with   Abdominal Pain       Brief summary   Patient is a 68 y.o. female with longstanding recurrent diverticulitis with known dense colonic diverticulosis, prior transverse colon perforation requiring colostomy and subsequent takedown presented with abdominal pain. Patient states that she was hospitalized in [place] until 4/14 due to acute diverticulitis and was later diagnosed with UTI and has been on ciprofloxacin .  However despite this she has had continued dysuria, urgency and frequency of urination, along with what feels like air coming out of her bladder.  She also has had continued left lower quadrant abdominal pain, which never completely resolved. She has recently met Dr. Dorrie Gaudier of [facility] for outpatient consultation, hence presented to ED here.  She denied any fevers, diarrhea, had some mild nausea but no vomiting.  She has also seen GI outpatient.  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 Recently admitted at [facility] about 3 weeks ago, with sepsis secondary to diverticulitis, improved with antibiotics. - s/p perc drain placed 5/7, per surgery, no current indication for emergent surgery.   - Patient was placed on IV Zosyn , abscess cultures  + E. coli with no p.o. options - discussed with Dr. Alethea Andes and Dr. Ramiro Burly on 5/11 and was recommended no need for  urology and GYN input at this time. Even though CT abdomen had shown concerns for colovesical and colovaginal fistula, these are not acutely treated inpatient and will be addressed by colorectal surgery at the f/u appointment.  Info relayed to ID -Repeat CT on 5/11 showed abscess completely decompressed, acute diverticulitis improving. - ID following, switched IV Zosyn  to IV ceftriaxone and Flagyl  - Patient has chronic leukocytosis and is followed by hematology in [place] -  per ID if sensitive to cefazolin , plan 4 weeks of ?cefadroxil and Flagyl versus IV ceftriaxone and Flagyl via PICC line, awaiting ID recommendations for disposition.   Hypertension - Continue Norvasc     Paroxysmal atrial fibrillation - continue Toprol -XL, amiodarone . - Continue Xarelto    Hyperlipidemia - Continue Lipitor  Hypokalemia - K stable, replace as needed  Constipation - Resolved, per patient she had several BMs yesterday and stool softeners/MiraLAX discontinued  Chronic leukocytosis - Per patient, she has a longstanding history of chronic leukocytosis and follows hematology outpatient in [place], [place]  Estimated body mass index is 27.96 kg/m as calculated from the following:   Height as of this encounter: 5\' 3"  (1.6 m).   Weight as of this encounter: 71.6 kg.  Code Status: Full code DVT Prophylaxis:  rivaroxaban  (XARELTO ) tablet 20 mg Start: 11/14/23 1200 SCDs Start: 11/11/23 0310 rivaroxaban  (XARELTO ) tablet 20 mg   Level of Care: Level of care: Med-Surg Family Communication: Updated patient's husband at the bedside Disposition Plan:      Remains inpatient appropriate: Awaiting final ID recommendations for disposition   Procedures:  Percutaneous drain 5/7  Consultants:    IR Gen surgery  - ID  Antimicrobials:   Anti-infectives (From admission, onward)    Start     Dose/Rate Route Frequency Ordered Stop   11/16/23 2200  metroNIDAZOLE (FLAGYL) tablet 500 mg        500 mg Oral Every  12 hours 11/16/23 0851     11/16/23 1500  cefTRIAXone (ROCEPHIN) 2 g in sodium chloride  0.9 % 100 mL IVPB        2 g 200 mL/hr over 30 Minutes Intravenous Daily 11/16/23 0851     11/11/23 0800  piperacillin -tazobactam (ZOSYN ) IVPB 3.375 g  Status:  Discontinued        3.375 g 12.5 mL/hr over 240 Minutes Intravenous Every 8 hours 11/11/23 0313 11/16/23 0851   11/11/23 0200  piperacillin -tazobactam (ZOSYN ) IVPB 3.375 g        3.375 g 100 mL/hr over 30 Minutes Intravenous  Once 11/11/23 0159 11/11/23 0329          Medications  amiodarone   200 mg Oral Daily   atorvastatin   80 mg Oral Daily   fluticasone  1 spray Each Nare Daily   loratadine  10 mg Oral Daily   metoprolol  succinate  50 mg Oral BID   metroNIDAZOLE  500 mg Oral Q12H   predniSONE   5 mg Oral Q breakfast   rivaroxaban   20 mg Oral Q lunch   sodium chloride  flush  5 mL Intracatheter Q8H      Subjective:   Olivia [name] was seen and examined today.  Improving, had several BMs yesterday.  No acute worsening abdominal pain, nausea vomiting or fevers.  Husband at the bedside.    Objective:   Vitals:   11/16/23 0856 11/16/23 1341 11/16/23 2018 11/17/23 0618  BP: (!) 123/58 114/81 (!) 125/50 (!) 120/48  Pulse: 66 73 76 78  Resp:  18 18 18   Temp:  99.3 F (37.4 C) 98.8 F (37.1 C) 99.6 F (37.6 C)  TempSrc:  Oral Oral Oral  SpO2:  97% 97% 95%  Weight:      Height:        Intake/Output Summary (Last 24 hours) at 11/17/2023 1200 Last data filed at 11/17/2023 1154 Gross per 24 hour  Intake 930.5 ml  Output 982.5 ml  Net -52 ml     Wt Readings from Last 3 Encounters:  11/16/23 71.6 kg  11/02/23 72.8 kg  07/03/23 73.5 kg   Physical Exam General: Alert and oriented x 3, NAD Cardiovascular: S1 S2 clear, RRR.  Respiratory: CTAB, no wheezing Gastrointestinal: Soft, NT, ND, NBS, PERC drain + Ext: no pedal edema bilaterally Neuro: no new deficits Psych: Normal affect    Data Reviewed:  I have personally  reviewed following labs    CBC Lab Results  Component Value Date   WBC 19.5 (H) 11/17/2023   RBC 4.24 11/17/2023   HGB 12.5 11/17/2023   HCT 40.0 11/17/2023   MCV 94.3 11/17/2023   MCH 29.5 11/17/2023   PLT 686 (H) 11/17/2023   MCHC 31.3 11/17/2023   RDW 14.8 11/17/2023   LYMPHSABS 3.3 11/11/2023  MONOABS 1.8 (H) 11/11/2023   EOSABS 0.0 11/11/2023   BASOSABS 0.1 11/11/2023     Last metabolic panel Lab Results  Component Value Date   NA 135 11/17/2023   K 3.7 11/17/2023   CL 104 11/17/2023   CO2 23 11/17/2023   BUN 6 (L) 11/17/2023   CREATININE 0.78 11/17/2023   GLUCOSE 66 (L) 11/17/2023   GFRNONAA >60 11/17/2023   GFRAA >60 01/03/2020   CALCIUM  8.2 (L) 11/17/2023   PHOS 3.0 11/17/2023   PROT 5.5 (L) 11/12/2023   ALBUMIN 2.8 (L) 11/17/2023   LABGLOB 3.0 12/07/2019   AGRATIO 1.4 12/07/2019   BILITOT 0.7 11/12/2023   ALKPHOS 52 11/12/2023   AST 18 11/12/2023   ALT 17 11/12/2023   ANIONGAP 8 11/17/2023    CBG (last 3)  No results for input(s): "GLUCAP" in the last 72 hours.    Coagulation Profile: Recent Labs  Lab 11/11/23 0512  INR 1.0     Radiology Studies: I have personally reviewed the imaging studies  CT ABDOMEN PELVIS W CONTRAST Result Date: 11/15/2023 CLINICAL DATA:  Sigmoid diverticulitis with abscess formation and status post percutaneous catheter drainage diverticular abscess on 11/11/2023. EXAM: CT ABDOMEN AND PELVIS WITH CONTRAST TECHNIQUE: Multidetector CT imaging of the abdomen and pelvis was performed using the standard protocol following bolus administration of intravenous contrast. RADIATION DOSE REDUCTION: This exam was performed according to the departmental dose-optimization program which includes automated exposure control, adjustment of the mA and/or kV according to patient size and/or use of iterative reconstruction technique. CONTRAST:  OMNIPAQUE  IOHEXOL  300 MG/ML  SOLN COMPARISON:  11/11/2023 FINDINGS: Lower chest: No acute  abnormality. Hepatobiliary: Unremarkable liver. There is air in the biliary tree and gallbladder likely related to prior sphincterotomy. Pancreas: Unremarkable. No pancreatic ductal dilatation or surrounding inflammatory changes. Spleen: Normal in size without focal abnormality. Adrenals/Urinary Tract: Adrenal glands are unremarkable. Kidneys are normal, without renal calculi, focal lesion, or hydronephrosis. Bladder is unremarkable. Stomach/Bowel: No bowel obstruction or significant ileus. Normal appendix. Inflammation of the sigmoid colon related to acute diverticulitis appears slightly improved compared to the prior study. Percutaneous drain is positioned within the diverticular abscess located just inferior to the sigmoid colon and superior to the bladder. The abscess has been completely decompressed with no significant fluid remaining. Vascular/Lymphatic: Stable atherosclerosis of the abdominal aorta without aneurysm. No lymphadenopathy identified. Reproductive: Status post hysterectomy. No adnexal masses. Other: Ventral abdominal wall hernia mesh without evidence abdominal wall hernia. No abdominopelvic ascites. Musculoskeletal: No acute or significant osseous findings. IMPRESSION: 1. Percutaneous drain is positioned within the diverticular abscess located just inferior to the sigmoid colon and superior to the bladder. The abscess has been completely decompressed with no significant fluid remaining. 2. Inflammation of the sigmoid colon related to acute diverticulitis appears slightly improved compared to the prior study. 3. Air in the biliary tree and gallbladder likely related to prior sphincterotomy. 4. Stable atherosclerosis of the abdominal aorta without aneurysm. 5. Ventral abdominal wall hernia mesh without evidence of abdominal wall hernia. Aortic Atherosclerosis (ICD10-I70.0). Electronically Signed   By: Erica Hau M.D.   On: 11/15/2023 16:00        Lior Hoen M.D. [facility] 11/17/2023, 12:00 PM  Available via Epic secure chat 7am-7pm After 7 pm, please refer to night coverage provider listed on amion.

## 2023-11-17 NOTE — Plan of Care (Signed)

## 2023-11-17 NOTE — Progress Notes (Signed)
 [facility]  Date of Admission:  11/10/2023   Total days of inpatient antibiotics 7  Principal Problem:   Acute diverticulitis Active Problems:   Normocytic anemia   GERD (gastroesophageal reflux disease)   Dyslipidemia   Peripheral arterial disease (HCC)   Paroxysmal A-fib (HCC)          Assessment: # Complicated sigmoid diverticulitis with abscess, with multiple recurrences and prior h/o perforation and ostomy  - No intervention per surgery  - 5/7 CT guided JP drain placement. 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 If sens then will plan on about 4 weeks of cefaroxil +metro otherwise continue IV ceftiaxone and metro via picc.  Final antibiotic plan pending radiographic and clinical progression.  Will need repeat CT abdomen pelvis outpatient, stop antibiotics. -Urology and GYN involvement addressed outpatient - Universal precautions   #Chronic leukocytosis -follows with heme outpatient. Around 17k  Microbiology:   Antibiotics: Ctx and metro   SUBJECTIVE: Resting in bed. No new complaints  Review of Systems: Review of Systems  All other systems reviewed and are negative.    Scheduled Meds:  amiodarone   200 mg Oral Daily   atorvastatin   80 mg Oral Daily   fluticasone  1 spray Each Nare Daily   loratadine  10 mg Oral Daily   metoprolol  succinate  50 mg Oral BID   metroNIDAZOLE  500 mg Oral Q12H   predniSONE   5 mg Oral Q breakfast   rivaroxaban   20 mg Oral Q lunch   sodium chloride  flush  5 mL Intracatheter Q8H   Continuous Infusions:  cefTRIAXone (ROCEPHIN)  IV 2 g (11/17/23 0822)   PRN Meds:.acetaminophen  **OR** acetaminophen , albuterol , morphine  injection, naLOXone  (NARCAN )  injection, ondansetron  (ZOFRAN ) IV, polyethylene glycol, traMADol, traZODone  No Known Allergies  OBJECTIVE: Vitals:   11/16/23 1341 11/16/23 2018 11/17/23 0618  11/17/23 1324  BP: 114/81 (!) 125/50 (!) 120/48 102/74  Pulse: 73 76 78 67  Resp: 18 18 18 16   Temp: 99.3 F (37.4 C) 98.8 F (37.1 C) 99.6 F (37.6 C) 98 F (36.7 C)  TempSrc: Oral Oral Oral Oral  SpO2: 97% 97% 95% 98%  Weight:      Height:       Body mass index is 27.96 kg/m.  Physical Exam Constitutional:      Appearance: Normal appearance.  HENT:     Head: Normocephalic and atraumatic.     Right Ear: Tympanic membrane normal.     Left Ear: Tympanic membrane normal.     Nose: Nose normal.     Mouth/Throat:     Mouth: Mucous membranes are moist.  Eyes:     Extraocular Movements: Extraocular movements intact.     Conjunctiva/sclera: Conjunctivae normal.     Pupils: Pupils are equal, round, and reactive to light.  Cardiovascular:     Rate and Rhythm: Normal rate and regular rhythm.     Heart sounds: No murmur heard.    No friction rub. No gallop.  Pulmonary:     Effort: Pulmonary effort is normal.     Breath sounds: Normal breath sounds.  Abdominal:     General: Abdomen is flat.     Palpations: Abdomen is soft.     Comments: Drain in place.   Musculoskeletal:        General: Normal range of motion.  Skin:  General: Skin is warm and dry.  Neurological:     General: No focal deficit present.     Mental Status: She is alert and oriented to person, place, and time.  Psychiatric:        Mood and Affect: Mood normal.       Lab Results Lab Results  Component Value Date   WBC 19.5 (H) 11/17/2023   HGB 12.5 11/17/2023   HCT 40.0 11/17/2023   MCV 94.3 11/17/2023   PLT 686 (H) 11/17/2023    Lab Results  Component Value Date   CREATININE 0.78 11/17/2023   BUN 6 (L) 11/17/2023   NA 135 11/17/2023   K 3.7 11/17/2023   CL 104 11/17/2023   CO2 23 11/17/2023    Lab Results  Component Value Date   ALT 17 11/12/2023   AST 18 11/12/2023   ALKPHOS 52 11/12/2023   BILITOT 0.7 11/12/2023        Orlie Bjornstad, MD [facility] 11/17/2023, 5:09 PM

## 2023-11-18 ENCOUNTER — Other Ambulatory Visit (HOSPITAL_COMMUNITY): Payer: Self-pay

## 2023-11-18 ENCOUNTER — Other Ambulatory Visit: Payer: Self-pay

## 2023-11-18 DIAGNOSIS — K5792 Diverticulitis of intestine, part unspecified, without perforation or abscess without bleeding: Secondary | ICD-10-CM | POA: Diagnosis not present

## 2023-11-18 LAB — RENAL FUNCTION PANEL
Albumin: 2.6 g/dL — ABNORMAL LOW (ref 3.5–5.0)
Anion gap: 11 (ref 5–15)
BUN: 8 mg/dL (ref 8–23)
CO2: 19 mmol/L — ABNORMAL LOW (ref 22–32)
Calcium: 8.5 mg/dL — ABNORMAL LOW (ref 8.9–10.3)
Chloride: 106 mmol/L (ref 98–111)
Creatinine, Ser: 0.68 mg/dL (ref 0.44–1.00)
GFR, Estimated: >60 mL/min (ref >60)
Glucose, Bld: 83 mg/dL (ref 70–99)
Phosphorus: 3.6 mg/dL (ref 2.5–4.6)
Potassium: 3.7 mmol/L (ref 3.5–5.1)
Sodium: 136 mmol/L (ref 135–145)

## 2023-11-18 LAB — CBC
HCT: 40 % (ref 36.0–46.0)
Hemoglobin: 12.2 g/dL (ref 12.0–15.0)
MCH: 29.4 pg (ref 26.0–34.0)
MCHC: 30.5 g/dL (ref 30.0–36.0)
MCV: 96.4 fL (ref 80.0–100.0)
Platelets: 674 10*3/uL — ABNORMAL HIGH (ref 150–400)
RBC: 4.15 MIL/uL (ref 3.87–5.11)
RDW: 14.6 % (ref 11.5–15.5)
WBC: 16 10*3/uL — ABNORMAL HIGH (ref 4.0–10.5)
nRBC: 0 % (ref 0.0–0.2)

## 2023-11-18 LAB — BODY FLUID CULTURE W GRAM STAIN: Special Requests: NORMAL

## 2023-11-18 LAB — URINE CULTURE

## 2023-11-18 MED ORDER — SODIUM CHLORIDE 0.9% FLUSH
10.0000 mL | Freq: Two times a day (BID) | INTRAVENOUS | Status: DC
  Administered 2023-11-18: 10 mL

## 2023-11-18 MED ORDER — NORMAL SALINE FLUSH 0.9 % IV SOLN
INTRAVENOUS | 2 refills | Status: DC
  Filled 2023-11-18: qty 300, 30d supply, fill #0

## 2023-11-18 MED ORDER — SODIUM CHLORIDE 0.9% FLUSH: 10.0000 mL | INTRAVENOUS | 0 refills | Status: DC | PRN

## 2023-11-18 MED ORDER — SODIUM CHLORIDE 0.9% FLUSH: 10.0000 mL | INTRAVENOUS | Status: DC | PRN

## 2023-11-18 MED ORDER — CHLORHEXIDINE GLUCONATE CLOTH 2 % EX PADS
6.0000 | MEDICATED_PAD | Freq: Every day | CUTANEOUS | Status: DC
Start: 2023-11-18 — End: 2023-11-19
  Administered 2023-11-18: 6 via TOPICAL

## 2023-11-18 MED ORDER — METRONIDAZOLE 500 MG PO TABS: 500.0000 mg | ORAL_TABLET | Freq: Two times a day (BID) | ORAL | 0 refills | Status: DC

## 2023-11-18 MED ORDER — CEFTRIAXONE IV (FOR PTA / DISCHARGE USE ONLY): 2.0000 g | INTRAVENOUS | 0 refills | Status: AC

## 2023-11-18 MED ORDER — TRAMADOL HCL 50 MG PO TABS: 50.0000 mg | ORAL_TABLET | Freq: Four times a day (QID) | ORAL | 0 refills | Status: AC | PRN

## 2023-11-18 NOTE — TOC Progression Note (Addendum)
 Transition of Care [facility]) - Progression Note    Patient Details  Name: Olivia [name] MRN: [id number] Date of Birth: 1956/01/23  Transition of Care [facility]) CM/SW Contact  Bari Leys, RN Phone Number: 11/18/2023, 2:37 PM  Clinical Narrative:  Per attending, plan for dc today if PICC line placed, iv abx teaching completed and [facility] RN arranged. Per Pam with [facility],  iv abx teaching completed at bedside. HH RN arranged with [facility],  [address], [place], [place] [address], Phone: [phone] , added to AVS,  awaiting PICC line placement for possible dc today.      Expected Discharge Plan: Home w Home Health Services    Expected Discharge Plan and Services       Living arrangements for the past 2 months: Single Family Home                                       Social Determinants of Health (SDOH) Interventions SDOH Screenings   Food Insecurity: No Food Insecurity (11/11/2023)  Housing: Low Risk  (11/11/2023)  Transportation Needs: No Transportation Needs (11/11/2023)  Utilities: Not At Risk (11/11/2023)  Social Connections: Socially Integrated (11/11/2023)  Tobacco Use: Medium Risk (11/10/2023)    Readmission Risk Interventions    11/11/2023    1:06 PM  Readmission Risk Prevention Plan  Post Dischage Appt Complete  Medication Screening Complete  Transportation Screening Complete

## 2023-11-18 NOTE — Plan of Care (Signed)

## 2023-11-18 NOTE — Care Management Important Message (Signed)
 Important Message  Patient Details  Name: Olivia [name] MRN: [id number] Date of Birth: 19-Oct-1955   Important Message Given:        Peyton Brash 11/18/2023, 11:47 AM

## 2023-11-18 NOTE — Progress Notes (Signed)
 Mobility Specialist - Progress Note   11/18/23 0935  Mobility  Activity Ambulated independently in hallway  Level of Assistance Independent  Assistive Device None  Distance Ambulated (ft) 500 ft  Activity Response Tolerated well  Mobility Referral Yes  Mobility visit 1 Mobility  Mobility Specialist Start Time (ACUTE ONLY) 0914  Mobility Specialist Stop Time (ACUTE ONLY) 0929  Mobility Specialist Time Calculation (min) (ACUTE ONLY) 15 min   Pt received in bed and agreeable to mobility. No complaints during session. Pt to bed after session with all needs met.    [facility]

## 2023-11-18 NOTE — Progress Notes (Addendum)
 Chief Complaint: Patient was seen today for Recent left lower abdominal pain, pneumaturia, diverticular abscess with possible colovesical fistula; s/p lower abdominal drain placement 5/7   Supervising Physician: Art Largo   Subjective: 5/14: Patient is doing well. Patient denies drain concerns. Husband at bedside.   Objective: Physical Exam: BP (!) 154/69 (BP Location: Left Arm)   Pulse 69   Temp 98.1 F (36.7 C) (Oral)   Resp 18   Ht 5\' 3"  (1.6 m)   Wt 155 lb 10.3 oz (70.6 kg)   SpO2 97%   BMI 27.57 kg/m  Constitutional: adult female, alert and oriented, INAD.  Skin: warm and dry. Drain insertion site without surrounding erythema or edema. Stat lock not in place. Milky to green color fluid in JP bulb. No TTP over site.  Chest: symmetric rise and fall of chest.  Lungs: breathing comfortably on room air. No evidence of respiratory distress.  Neuro: alert and oriented. Psych: insight and judgement within normal limits. Logical thought process. Non pressured speech.    Current Facility-Administered Medications:    acetaminophen  (TYLENOL ) tablet 650 mg, 650 mg, Oral, Q6H PRN, 650 mg at 11/17/23 1028 **OR** acetaminophen  (TYLENOL ) suppository 650 mg, 650 mg, Rectal, Q6H PRN, Howerter, Justin B, DO   albuterol  (PROVENTIL ) (2.5 MG/3ML) 0.083% nebulizer solution 2.5 mg, 2.5 mg, Nebulization, Q2H PRN, Jannette Mend, Mir M, MD   amiodarone  (PACERONE ) tablet 200 mg, 200 mg, Oral, Daily, Jannette Mend, Mir M, MD, 200 mg at 11/18/23 9528   atorvastatin  (LIPITOR) tablet 80 mg, 80 mg, Oral, Daily, Jannette Mend, Mir M, MD, 80 mg at 11/18/23 0936   cefTRIAXone (ROCEPHIN) 2 g in sodium chloride  0.9 % 100 mL IVPB, 2 g, Intravenous, Daily, Manandhar, Sabina, MD, Last Rate: 200 mL/hr at 11/18/23 0940, 2 g at 11/18/23 0940   fluticasone (FLONASE) 50 MCG/ACT nasal spray 1 spray, 1 spray, Each Nare, Daily, Rai, Ripudeep K, MD, 1 spray at 11/18/23 0959   loratadine (CLARITIN) tablet 10 mg, 10 mg, Oral,  Daily, Rai, Ripudeep K, MD, 10 mg at 11/18/23 4132   metoprolol  succinate (TOPROL -XL) 24 hr tablet 50 mg, 50 mg, Oral, BID, Ikramullah, Mir M, MD, 50 mg at 11/18/23 0935   metroNIDAZOLE (FLAGYL) tablet 500 mg, 500 mg, Oral, Q12H, Manandhar, Sabina, MD, 500 mg at 11/18/23 4401   morphine  (PF) 4 MG/ML injection 4 mg, 4 mg, Intravenous, Q3H PRN, Howerter, Justin B, DO, 4 mg at 11/14/23 2203   naloxone  (NARCAN ) injection 0.4 mg, 0.4 mg, Intravenous, PRN, Howerter, Justin B, DO   ondansetron  (ZOFRAN ) injection 4 mg, 4 mg, Intravenous, Q6H PRN, Howerter, Justin B, DO   polyethylene glycol (MIRALAX / GLYCOLAX) packet 17 g, 17 g, Oral, Daily PRN, Rai, Ripudeep K, MD   predniSONE  (DELTASONE ) tablet 5 mg, 5 mg, Oral, Q breakfast, Rai, Ripudeep K, MD, 5 mg at 11/18/23 0272   rivaroxaban  (XARELTO ) tablet 20 mg, 20 mg, Oral, Q lunch, Pham, Anh P, RPH, 20 mg at 11/18/23 1109   sodium chloride  flush (NS) 0.9 % injection 5 mL, 5 mL, Intracatheter, Q8H, Susan Ensign A, MD, 5 mL at 11/18/23 1426   traMADol (ULTRAM) tablet 50 mg, 50 mg, Oral, Q6H PRN, Rai, Ripudeep K, MD, 50 mg at 11/18/23 1515   traZODone  (DESYREL ) tablet 25 mg, 25 mg, Oral, QHS PRN, Jannette Mend, Mir M, MD  Labs: CBC Recent Labs    11/17/23 0451 11/18/23 0511  WBC 19.5* 16.0*  HGB 12.5 12.2  HCT 40.0 40.0  PLT 686*  674*   BMET Recent Labs    11/17/23 0451 11/18/23 0511  NA 135 136  K 3.7 3.7  CL 104 106  CO2 23 19*  GLUCOSE 66* 83  BUN 6* 8  CREATININE 0.78 0.68  CALCIUM  8.2* 8.5*   LFT Recent Labs    11/18/23 0511  ALBUMIN 2.6*   PT/INR No results for input(s): "LABPROT", "INR" in the last 72 hours.   Studies/Results: US  EKG SITE RITE Result Date: 11/18/2023 If Site Rite image not attached, placement could not be confirmed due to current cardiac rhythm.   Assessment/Plan: Recent left lower abdominal pain, pneumaturia, diverticular abscess with possible colovesical fistula; s/p lower abdominal drain placement  5/7    Drain Location: LLQ Size: Fr size: 10 Fr Date of placement: 11/11/23  Currently to: Drain collection device: gravity 24 hour output:  Output by Drain (mL) 11/16/23 0700 - 11/16/23 1459 11/16/23 1500 - 11/16/23 2259 11/16/23 2300 - 11/17/23 0659 11/17/23 0700 - 11/17/23 1459 11/17/23 1500 - 11/17/23 2259 11/17/23 2300 - 11/18/23 0659 11/18/23 0700 - 11/18/23 1459 11/18/23 1500 - 11/18/23 1621  Closed System Drain 1 Midline Abdomen Bulb (JP) 10 Fr. 5 12.5 10 5 5 10 10      Interval imaging/drain manipulation:  TBD  Current examination: Flushes/aspirates easily. Discharge occurs at drain site with flushing.  Insertion site unremarkable. Dressed appropriately.  JP bulb switched to gravity bag. New stat lock placed.   Plan: After review by Dr. Darylene Epley, it is recommended to switch JP bulb to gravity bag, Patient will have an outpatient follow up in 1 week. She should not flush the drain until her next follow up due to output at insertion site.  New stat lock placed.  Record output and color once daily.  Dressing changes every 2-3 days or sooner if needed, Contact IR with any questions/concerns.  Discharge planning: Pending discharge today.  Patient educated about drain care. Patient should not flush the drain at this time. Patient educated to record the output and color daily and change dressings every 2-3 days or more frequent if needed. Patient will follow up in 7-10 days in our outpatient clinic. Order placed. Patient encouraged to contact our office with any questions/concerns before follow up.     LOS: 7 days   I spent a total of 20 minutes in face to face in clinical consultation, greater than 50% of which was counseling/coordinating care for acute diverticulitis.   Neomia Banner Jerrelle Michelsen PA-C 11/18/2023 4:21 PM

## 2023-11-18 NOTE — Discharge Summary (Signed)
 Physician Discharge Summary   Patient: Olivia [name] MRN: [id number] DOB: 06-Aug-1955  Admit date:     11/10/2023  Discharge date: 11/18/23  Discharge Physician: Ephriam Hashimoto   PCP: Darreld Elms, PA-C     Recommendations at discharge:  Follow up with PCP Clover Dao in 1 week for diverticulitis with colovesicular fistula [name] Shiftlett: Please resume amlodipine  if appropriate (held for soft BP in setting of infection) Repeat labs not needed, weekly labs to be drawn by home health while on Rocephin for 28 days  Follow up with General Surgery for colovesicular fistula and possible colectomy is planned Dr. Dorrie Gaudier Follow up with Interventional Radiology for percutaneous drain management is planned     Discharge Diagnoses: Principal Problem:   Acute diverticulitis Active Problems:   Normocytic anemia   GERD (gastroesophageal reflux disease)   Dyslipidemia   Peripheral arterial disease (HCC)   Paroxysmal A-fib [facility])      Hospital Course: 68 y.o. female with longstanding recurrent diverticulitis with known dense colonic diverticulosis, prior transverse colon perforation requiring colostomy and subsequent takedown presented with abdominal pain.  Was hospitalized in [place] until 4/14 due to acute diverticulitis and was later diagnosed with UTI and has been on ciprofloxacin .  However despite this she had continued dysuria, urgency and frequency of urination, along with what feels like air coming out of her bladder.  She also had continued left lower quadrant abdominal pain, which never completely resolved.   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 Recently admitted at [facility] about 3 weeks  ago, with sepsis secondary to diverticulitis, improved with antibiotics. - s/p perc drain placed 5/7, per surgery, no current indication for emergent surgery.   - Patient was placed on IV Zosyn , abscess cultures  + E. coli with no p.o. options - discussed with Dr. Alethea Andes and Dr. Ramiro Burly on 5/11 and was recommended no need for urology and GYN input at this time. Even though CT abdomen had shown concerns for colovesical and colovaginal fistula, these are not acutely treated inpatient and will be addressed by colorectal surgery at the f/u appointment.  Info relayed to ID -Repeat CT on 5/11 showed abscess completely decompressed, acute diverticulitis improving. - ID following, switched IV Zosyn  to IV ceftriaxone and Flagyl  At time of discharge, ID recommended 4 weeks Rocephin IV and oral Flagyl  ID follow up recommended.  IR follow up arranged.  Gen Surg follow up pending.     Hypertension Hold Norvasc  due to soft BP and follow up with PCP.   Paroxysmal atrial fibrillation Stable on Toprol -XL, amiodarone  and Xarelto      Chronic leukocytosis Per patient, she has a longstanding history of chronic leukocytosis and follows hematology outpatient in [place], [place]        The [place]  Controlled Substances Registry was reviewed for this patient prior to discharge.  Disposition: Home Diet recommendation:  Regular diet  DISCHARGE MEDICATION: Allergies as of 11/18/2023   No Known Allergies      Medication List     PAUSE taking these medications    alendronate 70 MG tablet Wait to take this until your doctor or other care provider tells you to start again. Commonly known as: FOSAMAX Take 70 mg by mouth every Monday. Take  with a full glass of water  on an empty stomach.   amLODipine  5 MG tablet Wait to take this until your doctor or other care provider tells you to start again. Commonly known as: NORVASC  Take 5 mg by mouth 2 (two) times daily.       STOP taking these  medications    ciprofloxacin  500 MG tablet Commonly known as: CIPRO        TAKE these medications    amiodarone  200 MG tablet Commonly known as: PACERONE  Take 200 mg by mouth at bedtime.   atorvastatin  80 MG tablet Commonly known as: LIPITOR Take 80 mg by mouth at bedtime.   calcium -vitamin D 500-5 MG-MCG tablet Commonly known as: OSCAL WITH D Take 1 tablet by mouth.   cefTRIAXone IVPB Commonly known as: ROCEPHIN Inject 2 g into the vein daily for 21 days. Indication:  Intra-abdominal abscess First Dose: Yes Last Day of Therapy:  12/09/23 Labs - Once weekly:  CBC/D and BMP, ESR and CRP Method of administration: IV Push Method of administration may be changed at the discretion of home infusion pharmacist based upon assessment of the patient and/or caregiver's ability to self-administer the medication ordered.   Dexilant  30 MG capsule DR Generic drug: Dexlansoprazole  Take 30 mg by mouth daily as needed (acid reflux).   ergocalciferol 1.25 MG (50000 UT) capsule Commonly known as: VITAMIN D2 Take 50,000 Units by mouth once a week. wednesday   MAGNESIUM GUMMIES PO Take 2 tablets by mouth daily.   metoprolol  succinate 50 MG 24 hr tablet Commonly known as: TOPROL -XL Take 50 mg by mouth 2 (two) times daily.   metroNIDAZOLE 500 MG tablet Commonly known as: Flagyl Take 1 tablet (500 mg total) by mouth 2 (two) times daily for 21 days.   predniSONE  5 MG tablet Commonly known as: DELTASONE  Take 5 mg by mouth daily.   Senna-Time 8.6 MG tablet Generic drug: senna Take 2 tablets by mouth as needed for constipation.   sodium chloride  flush 0.9 % Soln Commonly known as: NS 10 mLs by Intracatheter route as needed (flush).   sulfaSALAzine 500 MG tablet Commonly known as: AZULFIDINE Take 500 mg by mouth daily.   traMADol 50 MG tablet Commonly known as: ULTRAM Take 1 tablet (50 mg total) by mouth every 6 (six) hours as needed for up to 7 days for moderate pain (pain score  4-6).   VITAMIN B-12 IJ Inject 1,000 mcg into the muscle once a week. Wednesday   VITAMIN B 12 PO Place 3,000 mcg under the tongue 3 (three) times a week.   Xarelto  20 MG Tabs tablet Generic drug: rivaroxaban  Take 20 mg by mouth daily.               Discharge Care Instructions  (From admission, onward)           Start     Ordered   11/18/23 0000  Change dressing on IV access line weekly and PRN  (Home infusion instructions - [facility] )        11/18/23 1208            Follow-up Information     Rosetta Cons, MD Follow up.   Specialty: Radiology Why: To follow up with IR regarding your drain Contact information: [address] [employer] [address] [place] [address] [phone]         Kinsinger, Alphonso Aschoff, MD Follow up on 12/02/2023.   Specialty: General Surgery Why: 1010am. Please arrive 30  minutes prior to your appointment for paperwork. Please bring a copy of your photo ID and insurance card. Contact information: [address] [place] [address] [phone]         [facility] Follow up.   Why: Home Health Skilled Nursing (RN) for PICC line care Contact information: Address: [address] [place], [place] [address]  Phone: [phone]        Orlie Bjornstad, MD Follow up.   Specialty: Infectious Diseases Contact information: [address] [place] [address] [phone]         [name], [name], [place]. Schedule an appointment as soon as possible for a visit in 1 week(s).   Specialty: Physician Assistant Contact information: [address] [place] [address] [phone]                 Discharge Instructions     [facility] pharmacist to adjust dose for Vancomycin, Aminoglycosides and other anti-infective therapies as requested by physician.   Complete by: As directed    [facility] to provide Cath Flo 2mg    Complete by: As directed     Administer for PICC line occlusion and as ordered by physician for other access device issues.   Anaphylaxis Kit: Provided to treat any anaphylactic reaction to the medication being provided to the patient if First Dose or when requested by physician   Complete by: As directed    Epinephrine 1mg /ml vial / amp: Administer 0.3mg  (0.76ml) subcutaneously once for moderate to severe anaphylaxis, nurse to call physician and pharmacy when reaction occurs and call 911 if needed for immediate care   Diphenhydramine 50mg /ml IV vial: Administer 25-50mg  IV/IM PRN for first dose reaction, rash, itching, mild reaction, nurse to call physician and pharmacy when reaction occurs   Sodium Chloride  0.9% NS 500ml IV: Administer if needed for hypovolemic blood pressure drop or as ordered by physician after call to physician with anaphylactic reaction   Change dressing on IV access line weekly and PRN   Complete by: As directed    Discharge instructions   Complete by: As directed    **IMPORTANT DISCHARGE INSTRUCTIONS** If there are issues picking up your prescriptions, do not wait for [facility] to contact us , please call the [facility] nursing desk directly at [phone] and explain that you were just discharged and have an issue with medications and need help from Dr. Darlyn Eke  From Dr. Darlyn Eke: You were admitted with a diverticular abscess and colovesical fistula  For the abscess, take Rocephin once daily and Flagyl twice daily infusions  Go see the General Surgery doctor Dr. Dorrie Gaudier as instructed (contact info below)  Flush the drain as directed by Radiology Go see the Radiology drain clinic as directed (see contact info below)  Go see the infectious disease doctor Dr. Zelda Hickman in 3-4 weeks prior to completion of antibiotics Call her clinic to schedule  Go see your primary doctor in 1 week  Resume all your normal home medicines EXCEPT hold amlodipine  Don't take this until you see your doctor and  they can resume it   Flush IV access with Sodium Chloride  0.9% and Heparin  10 units/ml or 100 units/ml   Complete by: As directed    Home infusion instructions - [facility]   Complete by: As directed    Instructions: Flush IV access with Sodium Chloride  0.9% and Heparin  10units/ml or 100units/ml   Change dressing on IV  access line: Weekly and PRN   Instructions Cath Flo 2mg : Administer for PICC Line occlusion and as ordered by physician for other access device   [facility] pharmacist to adjust dose for: Vancomycin, Aminoglycosides and other anti-infective therapies as requested by physician   Increase activity slowly   Complete by: As directed    Method of administration may be changed at the discretion of home infusion pharmacist based upon assessment of the patient and/or caregiver's ability to self-administer the medication ordered   Complete by: As directed    No wound care   Complete by: As directed    No wound care   Complete by: As directed        Discharge Exam: Filed Weights   11/14/23 0558 11/16/23 0500 11/18/23 0500  Weight: 71.7 kg 71.6 kg 70.6 kg    General: Pt is alert, awake, not in acute distress Cardiovascular: RRR, nl S1-S2, no murmurs appreciated.   No LE edema.   Respiratory: Normal respiratory rate and rhythm.  CTAB without rales or wheezes. Abdominal: Abdomen soft and non-tender.  No distension or HSM.   Neuro/Psych: Strength symmetric in upper and lower extremities.  Judgment and insight appear normal.   Condition at discharge: good  The results of significant diagnostics from this hospitalization (including imaging, microbiology, ancillary and laboratory) are listed below for reference.   Imaging Studies: US  EKG SITE RITE Result Date: 11/18/2023 If Site Rite image not attached, placement could not be confirmed due to current cardiac rhythm.  CT ABDOMEN PELVIS W CONTRAST Result Date: 11/15/2023 CLINICAL DATA:  Sigmoid  diverticulitis with abscess formation and status post percutaneous catheter drainage diverticular abscess on 11/11/2023. EXAM: CT ABDOMEN AND PELVIS WITH CONTRAST TECHNIQUE: Multidetector CT imaging of the abdomen and pelvis was performed using the standard protocol following bolus administration of intravenous contrast. RADIATION DOSE REDUCTION: This exam was performed according to the departmental dose-optimization program which includes automated exposure control, adjustment of the mA and/or kV according to patient size and/or use of iterative reconstruction technique. CONTRAST:  OMNIPAQUE  IOHEXOL  300 MG/ML  SOLN COMPARISON:  11/11/2023 FINDINGS: Lower chest: No acute abnormality. Hepatobiliary: Unremarkable liver. There is air in the biliary tree and gallbladder likely related to prior sphincterotomy. Pancreas: Unremarkable. No pancreatic ductal dilatation or surrounding inflammatory changes. Spleen: Normal in size without focal abnormality. Adrenals/Urinary Tract: Adrenal glands are unremarkable. Kidneys are normal, without renal calculi, focal lesion, or hydronephrosis. Bladder is unremarkable. Stomach/Bowel: No bowel obstruction or significant ileus. Normal appendix. Inflammation of the sigmoid colon related to acute diverticulitis appears slightly improved compared to the prior study. Percutaneous drain is positioned within the diverticular abscess located just inferior to the sigmoid colon and superior to the bladder. The abscess has been completely decompressed with no significant fluid remaining. Vascular/Lymphatic: Stable atherosclerosis of the abdominal aorta without aneurysm. No lymphadenopathy identified. Reproductive: Status post hysterectomy. No adnexal masses. Other: Ventral abdominal wall hernia mesh without evidence abdominal wall hernia. No abdominopelvic ascites. Musculoskeletal: No acute or significant osseous findings. IMPRESSION: 1. Percutaneous drain is positioned within the  diverticular abscess located just inferior to the sigmoid colon and superior to the bladder. The abscess has been completely decompressed with no significant fluid remaining. 2. Inflammation of the sigmoid colon related to acute diverticulitis appears slightly improved compared to the prior study. 3. Air in the biliary tree and gallbladder likely related to prior sphincterotomy. 4. Stable atherosclerosis of the abdominal aorta without aneurysm. 5. Ventral abdominal wall hernia mesh without evidence  of abdominal wall hernia. Aortic Atherosclerosis (ICD10-I70.0). Electronically Signed   By: Erica Hau M.D.   On: 11/15/2023 16:00   CT GUIDED PERITONEAL/RETROPERITONEAL FLUID DRAIN BY PERC CATH Result Date: 11/11/2023 INDICATION: Pelvic diverticular abscess EXAM: CT-guided drainage catheter placement TECHNIQUE: Multidetector CT imaging of the pelvis was performed for evaluation and to allow targeting of the central diverticular abscess. RADIATION DOSE REDUCTION: This exam was performed according to the departmental dose-optimization program which includes automated exposure control, adjustment of the mA and/or kV according to patient size and/or use of iterative reconstruction technique. MEDICATIONS: The patient is currently admitted to the hospital and receiving intravenous antibiotics. The antibiotics were administered within an appropriate time frame prior to the initiation of the procedure. ANESTHESIA/SEDATION: Moderate (conscious) sedation was employed during this procedure. A total of Versed  2 mg and Fentanyl  100 mcg was administered intravenously by the radiology nurse. Total intra-service moderate Sedation Time: 27 minutes. The patient's level of consciousness and vital signs were monitored continuously by radiology nursing throughout the procedure under my direct supervision. COMPLICATIONS: None immediate. PROCEDURE: Informed written consent was obtained from the patient after a thorough discussion of the  procedural risks, benefits and alternatives. All questions were addressed. Maximal Sterile Barrier Technique was utilized including caps, mask, sterile gowns, sterile gloves, sterile drape, hand hygiene and skin antiseptic. A timeout was performed prior to the initiation of the procedure. In a supine position, radiopaque markers were placed on the patient's skin and the pelvis was imaged using CT guidance. The skin was then marked and sterilely prepped with ChloraPrep. Local anesthesia was achieved with 1% lidocaine . Lidocaine  was infiltrated from the skin to the abdominal rectus. A 7 cm Yueh needle was then advanced from the skin incision to the midpoint of the pelvic abscess. The needle was then withdrawn leaving the sheath. An Amplatz wire was then advanced through the sheath. Access was then dilated using a 10 French fascial dilator. The dilator was removed and a 10 French pigtail catheter was advanced over the guidewire and coiled within the abscess. Guidewire was removed. Retention suture and sterile dressing were applied. The catheter was connected to a JP bulb. IMPRESSION: Satisfactory placement of a 10 French pelvic abscess drain. Electronically Signed   By: Susan Ensign   On: 11/11/2023 15:52   CT ABDOMEN PELVIS W CONTRAST Result Date: 11/11/2023 CLINICAL DATA:  Left lower quadrant abdominal pain for 1 week. Worsens with bowel movement. History of diverticulitis. EXAM: CT ABDOMEN AND PELVIS WITH CONTRAST TECHNIQUE: Multidetector CT imaging of the abdomen and pelvis was performed using the standard protocol following bolus administration of intravenous contrast. RADIATION DOSE REDUCTION: This exam was performed according to the departmental dose-optimization program which includes automated exposure control, adjustment of the mA and/or kV according to patient size and/or use of iterative reconstruction technique. CONTRAST:  OMNIPAQUE  IOHEXOL  300 MG/ML  SOLN COMPARISON:  CT abdomen pelvis  11/18/2022 FINDINGS: Lower chest: No acute abnormality. Hepatobiliary: No focal hepatic lesion. Fluid and gas containing structure in the porta hepatis may be a remnant gallbladder stump as this communicates with the biliary tree. This is unchanged from 11/18/2022. Per chart review the patient is status post cholecystectomy. Pneumobilia. Unchanged prominence of the common bile duct measuring 10 mm in diameter. Pancreas: Unremarkable. Spleen: Unremarkable. Adrenals/Urinary Tract: Normal adrenal glands. Punctate nonobstructing stones bilaterally. No hydronephrosis. Urothelial thickening about the mid left ureter is likely reactive secondary to adjacent diverticulitis. Gas is present within the bladder. Bladder wall thickening in the dome  of the bladder. Stomach/Bowel: Stomach is within normal limits. Duodenal diverticulum. No bowel obstruction. Normal appendix. Extensive sigmoid and descending colon diverticulosis. There is marked wall thickening with adjacent inflammatory fat stranding and pericolonic fluid about the sigmoid colon. Peripherally enhancing irregular gas and fluid collection between the inflamed portions of the sigmoid colon in the dome of the bladder measuring 4.4 x 3.3 x 2.4 cm (series 2/image 69 and 7/69). Fluid-filled tracts extend between the sigmoid colon in the bladder. Along with the gas in the bladder this is highly suspicious for colovesical fistula with possible additional colocolic fistula. The gas and fluid collection extends posteriorly to the vaginal cuff (series 9/image 98). Small locule of gas in the vagina. Colovaginal fistula is difficult to exclude. Vascular/Lymphatic: Aortic atherosclerosis. No enlarged abdominal or pelvic lymph nodes. Reproductive: Hysterectomy. See above for discussion about possible colo vaginal fistula. No adnexal mass. Other: Ventral abdominal wall hernia repair. Musculoskeletal: No acute fracture. IMPRESSION: Complicated sigmoid diverticulitis with 4.4 cm gas  containing abscess between the sigmoid colon and bladder. 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 Soft tissue tracts extend between portions of the sigmoid colon as well as to the vaginal cuff suspicious for additional colocolic and colovaginal fistulas. Aortic Atherosclerosis (ICD10-I70.0). Electronically Signed   By: Rozell Cornet M.D.   On: 11/11/2023 01:51    Microbiology: Results for orders placed or performed during the hospital encounter of 11/10/23  Body fluid culture w Gram Stain     Status: None   Collection Time: 11/12/23 12:12 PM   Specimen: Pelvis; Body Fluid  Result Value Ref Range Status   Specimen Description   Final    PELVIS Performed at [facility], [address], [place], [place] [address]    Special Requests   Final    Normal Performed at [facility], [address], [place], [place] [address]    Gram Stain   Final    ABUNDANT WBC PRESENT, PREDOMINANTLY PMN NO ORGANISMS SEEN Performed at [facility], [address], [place], [place] [address]    Culture RARE ESCHERICHIA COLI  Final   Report Status 11/18/2023 FINAL  Final   Organism ID, Bacteria ESCHERICHIA COLI  Final   Organism ID, Bacteria ESCHERICHIA COLI  Final      Susceptibility   Escherichia coli - MIC*    AMPICILLIN >=32 RESISTANT Resistant     CEFEPIME <=0.12 SENSITIVE Sensitive     CEFTAZIDIME <=1 SENSITIVE Sensitive     CEFTRIAXONE <=0.25 SENSITIVE Sensitive     CIPROFLOXACIN  >=4 RESISTANT Resistant     GENTAMICIN <=1 SENSITIVE Sensitive     IMIPENEM <=0.25 SENSITIVE Sensitive     TRIMETH/SULFA >=320 RESISTANT Resistant     AMPICILLIN/SULBACTAM >=32 RESISTANT Resistant     PIP/TAZO <=4 SENSITIVE Sensitive ug/mL   Escherichia coli - KIRBY BAUER*    CEFAZOLIN  INTERMEDIATE Intermediate     * RARE ESCHERICHIA COLI    RARE ESCHERICHIA COLI  Urine Culture     Status: Abnormal   Collection Time: 11/17/23 12:04 PM    Specimen: Urine, Random  Result Value Ref Range Status   Specimen Description   Final    URINE, RANDOM Performed at [facility], [address], [place], [place] [address]    Special Requests   Final    NONE Reflexed from [id number] Performed at [facility], [address], [place], [place] [address]    Culture MULTIPLE  SPECIES PRESENT, SUGGEST RECOLLECTION (A)  Final   Report Status 11/18/2023 FINAL  Final    Labs: CBC: Recent Labs  Lab 11/14/23 0539 11/15/23 0530 11/16/23 0433 11/17/23 0451 11/18/23 0511  WBC 12.1* 15.5* 16.3* 19.5* 16.0*  HGB 11.8* 11.9* 11.9* 12.5 12.2  HCT 35.9* 38.3 38.3 40.0 40.0  MCV 89.8 93.6 93.4 94.3 96.4  PLT 561* 595* 623* 686* 674*   Basic Metabolic Panel: Recent Labs  Lab 11/14/23 0539 11/15/23 0530 11/16/23 0433 11/17/23 0451 11/18/23 0511  NA 139 139 135 135 136  K 3.1* 3.6 3.3* 3.7 3.7  CL 106 107 102 104 106  CO2 23 23 24 23  19*  GLUCOSE 99 93 90 66* 83  BUN <5* <5* 5* 6* 8  CREATININE 0.63 0.68 0.79 0.78 0.68  CALCIUM  8.3* 8.5* 8.4* 8.2* 8.5*  PHOS 3.2 3.4 3.2 3.0 3.6   Liver Function Tests: Recent Labs  Lab 11/12/23 0451 11/13/23 0452 11/14/23 0539 11/15/23 0530 11/16/23 0433 11/17/23 0451 11/18/23 0511  AST 18  --   --   --   --   --   --   ALT 17  --   --   --   --   --   --   ALKPHOS 52  --   --   --   --   --   --   BILITOT 0.7  --   --   --   --   --   --   PROT 5.5*  --   --   --   --   --   --   ALBUMIN 2.4*   < > 2.5* 2.6* 2.8* 2.8* 2.6*   < > = values in this interval not displayed.   CBG: No results for input(s): "GLUCAP" in the last 168 hours.  Discharge time spent: approximately 45 minutes spent on discharge counseling, evaluation of patient on day of discharge, and coordination of discharge planning with nursing, social work, pharmacy and case management  Signed: Ephriam Hashimoto, MD [facility] 11/18/2023

## 2023-11-18 NOTE — Progress Notes (Signed)
 Peripherally Inserted Central Catheter Placement  The IV Nurse has discussed with the patient and/or persons authorized to consent for the patient, the purpose of this procedure and the potential benefits and risks involved with this procedure.  The benefits include less needle sticks, lab draws from the catheter, and the patient may be discharged home with the catheter. Risks include, but not limited to, infection, bleeding, blood clot (thrombus formation), and puncture of an artery; nerve damage and irregular heartbeat and possibility to perform a PICC exchange if needed/ordered by physician.  Alternatives to this procedure were also discussed.  Bard Power PICC patient education guide, fact sheet on infection prevention and patient information card has been provided to patient /or left at bedside.    PICC Placement Documentation  PICC Single Lumen 11/18/23 Right Brachial 34 cm 0 cm (Active)  Indication for Insertion or Continuance of Line Home intravenous therapies (PICC only) 11/18/23 1800  Exposed Catheter (cm) 0 cm 11/18/23 1800  Site Assessment Clean, Dry, Intact 11/18/23 1800  Line Status Flushed;Saline locked;Blood return noted 11/18/23 1800  Dressing Type Transparent;Securing device 11/18/23 1800  Dressing Status Antimicrobial disc/dressing in place;Clean, Dry, Intact 11/18/23 1800  Line Care Connections checked and tightened 11/18/23 1800  Line Adjustment (NICU/IV Team Only) No 11/18/23 1800  Dressing Intervention New dressing;Adhesive placed at insertion site (IV team only) 11/18/23 1800  Dressing Change Due 11/25/23 11/18/23 1800       Dru Georges 11/18/2023, 6:33 PM

## 2023-11-18 NOTE — Progress Notes (Signed)
 PHARMACY CONSULT NOTE FOR:  OUTPATIENT  PARENTERAL ANTIBIOTIC THERAPY (OPAT)  Indication: Intra-abdominal abscess Regimen: Ceftriaxone 2g IV every 24 hours and oral metronidazole 500 mg po every 12 hours End date: 12/09/23 (4 weeks from drain placement 11/11/23)  IV antibiotic discharge orders are pended. To discharging provider:  please sign these orders via discharge navigator,  Select New Orders & click on the button choice - Manage This Unsigned Work.     Thank you for allowing pharmacy to be a part of this patient's care.  Garland Junk, PharmD, BCPS, BCIDP Infectious Diseases Clinical Pharmacist 11/18/2023 10:35 AM   **Pharmacist phone directory can now be found on [email or web address] ([email or web address]).  Listed under [facility].

## 2023-11-18 NOTE — Progress Notes (Addendum)
 [facility]  Date of Admission:  11/10/2023   Total days of inpatient antibiotics 8  Principal Problem:   Acute diverticulitis Active Problems:   Normocytic anemia   GERD (gastroesophageal reflux disease)   Dyslipidemia   Peripheral arterial disease (HCC)   Paroxysmal A-fib (HCC)          Assessment: # Complicated sigmoid diverticulitis with abscess, with multiple recurrences and prior h/o perforation and ostomy  - No intervention per surgery  - 5/7 CT guided JP drain placement. 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 Plan on PICC placement and IV ceftriaxone and po metronidazole x 4 weeks form drain placement. Communicated to primary -F/U with IR for drain management. Per communication no plans for inj study today. F/U with IR for drain management.  -F/U Dr. Gillian Lacrosse on 12/08/23 -Urology and GYN involvement addressed outpatient - Universal precautions   #Chronic leukocytosis -follows with heme outpatient.  ID will sign off  OPAT ORDERS:  Diagnosis: Intraabdominal infection with Ecoli  No Known Allergies   Discharge antibiotics to be given via PICC line:  Per pharmacy protocol IV ceftriaxone 2gm per day Metronidazole 500mg po bid  Duration: 4 weeks End Date: 6/4  [facility] Care Per Protocol with [employer] Use: Home health RN for IV administration and teaching, line care and labs.    Labs weekly while on IV antibiotics: __x CBC with differential __ BMP **TWICE WEEKLY ON VANCOMYCIN  __x CMP _x_ CRP _x_ ESR __ Vancomycin trough TWICE WEEKLY __ CK  __ Please pull PIC at completion of IV antibiotics _x_ Please leave PIC in place until doctor has seen patient or been notified  Fax weekly labs to [phone]  Clinic Follow Up Appt: 6/3  @ [facility] with [facility]  Microbiology:   Antibiotics: Ctx and  metro   SUBJECTIVE: Resting in bed. No new complaints  Review of Systems: Review of Systems  All other systems reviewed and are negative.    Scheduled Meds:  amiodarone   200 mg Oral Daily   atorvastatin   80 mg Oral Daily   fluticasone  1 spray Each Nare Daily   loratadine  10 mg Oral Daily   metoprolol  succinate  50 mg Oral BID   metroNIDAZOLE  500 mg Oral Q12H   predniSONE   5 mg Oral Q breakfast   rivaroxaban   20 mg Oral Q lunch   sodium chloride  flush  5 mL Intracatheter Q8H   Continuous Infusions:  cefTRIAXone (ROCEPHIN)  IV 2 g (11/18/23 0940)   PRN Meds:.acetaminophen  **OR** acetaminophen , albuterol , morphine  injection, naLOXone  (NARCAN )  injection, ondansetron  (ZOFRAN ) IV, polyethylene glycol, traMADol, traZODone  No Known Allergies  OBJECTIVE: Vitals:   11/17/23 1324 11/17/23 2020 11/18/23 0500 11/18/23 0544  BP: 102/74 123/85  (!) 125/55  Pulse: 67 71  65  Resp: 16 16  18   Temp: 98 F (36.7 C) 98.1 F (36.7 C)  98.1 F (36.7 C)  TempSrc: Oral Oral  Oral  SpO2: 98% 98%  94%  Weight:   70.6 kg   Height:       Body mass index is 27.57 kg/m.  Physical Exam Constitutional:      Appearance: Normal appearance.  HENT:     Head: Normocephalic and atraumatic.     Right Ear: Tympanic membrane normal.     Left Ear: Tympanic membrane normal.  Nose: Nose normal.     Mouth/Throat:     Mouth: Mucous membranes are moist.  Eyes:     Extraocular Movements: Extraocular movements intact.     Conjunctiva/sclera: Conjunctivae normal.     Pupils: Pupils are equal, round, and reactive to light.  Cardiovascular:     Rate and Rhythm: Normal rate and regular rhythm.     Heart sounds: No murmur heard.    No friction rub. No gallop.  Pulmonary:     Effort: Pulmonary effort is normal.     Breath sounds: Normal breath sounds.  Abdominal:     General: Abdomen is flat.     Palpations: Abdomen is soft.     Comments: Drain in place.   Musculoskeletal:        General:  Normal range of motion.  Skin:    General: Skin is warm and dry.  Neurological:     General: No focal deficit present.     Mental Status: She is alert and oriented to person, place, and time.  Psychiatric:        Mood and Affect: Mood normal.       Lab Results Lab Results  Component Value Date   WBC 16.0 (H) 11/18/2023   HGB 12.2 11/18/2023   HCT 40.0 11/18/2023   MCV 96.4 11/18/2023   PLT 674 (H) 11/18/2023    Lab Results  Component Value Date   CREATININE 0.68 11/18/2023   BUN 8 11/18/2023   NA 136 11/18/2023   K 3.7 11/18/2023   CL 106 11/18/2023   CO2 19 (L) 11/18/2023    Lab Results  Component Value Date   ALT 17 11/12/2023   AST 18 11/12/2023   ALKPHOS 52 11/12/2023   BILITOT 0.7 11/12/2023        Orlie Bjornstad, MD [facility] 11/18/2023, 11:22 AM

## 2023-11-18 NOTE — Discharge Instructions (Signed)
 Interventional Radiology Percutaneous Abscess Drain Placement After Care   This sheet gives you information about how to care for yourself after your procedure. Your health care provider may also give you more specific instructions. Your drain was placed by an interventional radiologist with [facility]. If you have questions or concerns, contact [facility] at [phone].   What is a percutaneous drain?   A drain is a small plastic tube (catheter) that goes into the fluid collection in your body through your skin.   How long will I need the drain?   How long the drain needs to stay in is determined by where the drain is, how much comes out of the drain each day and if you are having any other surgical procedures.   Interventional radiology will determine when it is time to remove the drain. It is important to follow up as directed so that the drain can be removed as soon as it is safe to do so.   What can I expect after the procedure?   After the procedure, it is common to have:   A small amount of bruising and discomfort in the area where the drainage tube (catheter) was placed.   Sleepiness and fatigue. This should go away after the medicines you were given have worn off.   Follow these instructions at home:   Insertion site care   Check your insertion site when you change the bandage. Check for:   More redness, swelling, or pain.   More fluid or blood.   Warmth.   Pus or a bad smell.   When caring for your insertion site:   Wash your hands with soap and water for at least 20 seconds before and after you change your bandage (dressing). If soap and water are not available, use hand sanitizer.   You do not need to change your dressing everyday if it is clean and dry. Change your dressing every 3 days or as needed when it is soiled, wet or becoming dislodged. You will need to change your dressing each time you shower.   Leave stitches (sutures), skin  glue, or adhesive strips in place. These skin closures may need to stay in place for 2 weeks or longer. If adhesive strip edges start to loosen and curl up, you may trim the loose edges. Do not remove adhesive strips completely unless your health care provider tells you to do so.   Catheter care   Flush the catheter once per day with 5 mL of 0.9% normal saline unless you are told otherwise by your healthcare provider. This helps to prevent clogs in the catheter.   To disconnect the drain, turn the clear plastic tube to the left. Attach the saline syringe by placing it on the white end of the drain and turning gently to the right. Once attached gently push the plunger to the 5 mL mark. After you are done flushing, disconnect the syringe by turning to the left and reattach your drainage container   If you have a bulb please be sure the bulb is charged after reconnecting it - to do this pinch the bulb between your thumb and first finger and close the stopper located on the top of the bulb.    Check for fluid leaking from around your catheter (instead of fluid draining through your catheter). This may be a sign that the drain is no longer working correctly.   Write down the following information every time you empty your  bag:   The date and time.   The amount of drainage.   Activity   Rest at home for 1-2 days after your procedure.   For the first 48 hours do not lift anything more than 10 lbs (about a gallon of milk). You may perform moderate activities/exercise. Please avoid strenuous activities during this time.   Avoid any activities which may pull on your drain as this can cause your drain to become dislodged.   If you were given a sedative during the procedure, it can affect you for several hours. Do not drive or operate machinery until your health care provider says that it is safe.   General instructions   For mild pain take over-the-counter medications as needed for pain such as  Tylenol or Advil. If you are experiencing severe pain please call our office as this may indicate an issue with your drain.    If you were prescribed an antibiotic medicine, take it as told by your health care provider. Do not stop using the antibiotic even if you start to feel better.   You may shower 24 hours after the drain is placed. To do this cover the insertion site with a water tight material such as saran wrap and seal the edges with tape, you may also purchase waterproof dressings at your local drug store. Shower as usual and then remove the water tight dressing and any gauze/tape underneath it once you have exited the shower and dried off. Allow the area to air dry or pat dry with a clean towel. Once the skin is completely dry place a new gauze dressing. It is important to keep the site dry at all times to prevent infection.   Do not submerge the drain - this means you cannot take baths, swim, use a hot tub, etc. until the drain is removed.    Do not use any products that contain nicotine or tobacco, such as cigarettes, e-cigarettes, and chewing tobacco. If you need help quitting, ask your health care provider.   Keep all follow-up visits as told by your health care provider. This is important.   Contact a health care provider if:   You have less than 10 mL of drainage a day for 2-3 days in a row, or as directed by your health care provider.   You have any of these signs of infection:   More redness, swelling, or pain around your incision area.   More fluid or blood coming from your incision area.   Warmth coming from your incision area.   Pus or a bad smell coming from your incision area.   You have fluid leaking from around your catheter (instead of through your catheter).   You are unable to flush the drain.   You have a fever or chills.   You have pain that does not get better with medicine.   You have not been contacted to schedule a drain follow up appointment  within 10 days of discharge from the hospital.   Please call [facility] at [phone] with any questions or concerns.   Get help right away if:   Your catheter comes out.   You suddenly stop having drainage from your catheter.   You suddenly have blood in the fluid that is draining from your catheter.   You become dizzy or you faint.   You develop a rash.   You have nausea or vomiting.   You have difficulty breathing or you feel short  of breath.   You develop chest pain.   You have problems with your speech or vision.   You have trouble balancing or moving your arms or legs.   Summary   It is common to have a small amount of bruising and discomfort in the area where the drainage tube (catheter) was placed. You may also have minor discomfort with movement while the drain is in place.   Flush the drain once per day with 5 mL of 0.9% normal saline (unless you were told otherwise by your healthcare provider).    Record the amount of drainage from the bag every time you empty it.   Change the dressing every 3 days or earlier if soiled/wet. Keep the skin dry under the dressing.   You may shower with the drain in place. Do not submerge the drain (no baths, swimming, hot tubs, etc.).   Contact [facility] at [phone] if you have more redness, swelling, or pain around your incision area or if you have pain that does not get better with medicine.   This information is not intended to replace advice given to you by your health care provider. Make sure you discuss any questions you have with your health care provider.   Document Revised: 09/26/2021 Document Reviewed: 06/18/2019   Elsevier Patient Education  2023 [employer]         Interventional Radiology Drain Record   Empty your drain at least once per day. You may empty it as often as needed. Use this form to write down the amount of fluid that has collected in the drainage container. Bring  this form with you to your follow-up visits. Please call [facility] at [phone] with any questions or concerns prior to your appointment.   Drain #1 location: ___________________   Date __________ Time __________ Amount __________   Date __________ Time __________ Amount __________   Date __________ Time __________ Amount __________   Date __________ Time __________ Amount __________   Date __________ Time __________ Amount __________   Date __________ Time __________ Amount __________   Date __________ Time __________ Amount __________   Date __________ Time __________ Amount __________   Date __________ Time __________ Amount __________   Date __________ Time __________ Amount __________   Date __________ Time __________ Amount __________   Date __________ Time __________ Amount __________   Date __________ Time __________ Amount __________   Date __________ Time __________ Amount __________

## 2023-11-18 NOTE — Progress Notes (Signed)
  Progress Note   Patient: Olivia [name] RUE:[id number] DOB: September 18, 1955 DOA: 11/10/2023     7 DOS: the patient was seen and examined on 11/18/2023        Brief hospital course: 67 yo F with recurrent diverticulitis, presented with complicated diverticulitis and  colovesical fistula.    Assessment and Plan: Recurrent diverticulitis - Continue Rocephin and Flagyl - Place PICC - Arrange for home health antibiotics  Hypertension - Continue amlodipine   Paroxysmal atrial fibrillation - Continue metoprolol , amiodarone , Xarelto   Hyperlipidemia - Continue Lipitor           Subjective: Patient is feeling well, looking forward to getting out of the hospital.     Physical Exam: BP (!) 154/69 (BP Location: Left Arm)   Pulse 69   Temp 98.1 F (36.7 C) (Oral)   Resp 18   Ht 5\' 3"  (1.6 m)   Wt 70.6 kg   SpO2 97%   BMI 27.57 kg/m   Elderly adult female, sitting up in bed, interactive and appropriate RRR, no murmurs, no peripheral edema Respiratory normal, lungs clear the rales or wheezes Abdomen soft, no tenderness palpation or guarding Attention normal, affect normal, judgment insight appear normal    Data Reviewed: Discussed with infectious disease White blood cell count shows white count down to 16, hemoglobin stable Basic metabolic panel normal  Family Communication: Husband at the bedside    Disposition: Status is: Inpatient         Author: Ephriam Hashimoto, MD 11/18/2023 5:35 PM  For on call review [email or web address].

## 2023-11-19 ENCOUNTER — Other Ambulatory Visit: Payer: Self-pay | Admitting: General Surgery

## 2023-11-19 ENCOUNTER — Other Ambulatory Visit (HOSPITAL_COMMUNITY): Payer: Self-pay

## 2023-11-19 DIAGNOSIS — K5792 Diverticulitis of intestine, part unspecified, without perforation or abscess without bleeding: Secondary | ICD-10-CM

## 2023-11-23 ENCOUNTER — Telehealth (HOSPITAL_COMMUNITY): Payer: Self-pay | Admitting: Student

## 2023-11-23 NOTE — Telephone Encounter (Signed)
 Patient with diverticular abscess drain placed in IR 11/11/23. Prior imaging shows suspected colovesical, colocolic and colovaginal fistulas.   Patient called IR today due to urinating what appears to be liquid stool. She is asymptomatic and has no physical/systemic issues such as pain, fevers, nausea, etc. She has not had any drain output for several days but she stated the output has always been low.   Case discussed with Dr. Jinx Mourning who recommended patient keep her scheduled appointment with IR on 11/27/23. Patient requested to be seen sooner. She has been rescheduled for 11/25/23. Patient aware.   Timtohy Broski, AGACNP-BC 11/23/2023, 1:58 PM

## 2023-11-25 ENCOUNTER — Ambulatory Visit
Admission: RE | Admit: 2023-11-25 | Discharge: 2023-11-25 | Disposition: A | Discharge status: Home / Self Care | Source: Ambulatory Visit | Attending: Radiology | Admitting: Radiology

## 2023-11-25 ENCOUNTER — Ambulatory Visit
Admission: RE | Admit: 2023-11-25 | Discharge: 2023-11-25 | Disposition: A | Discharge status: Home / Self Care | Source: Ambulatory Visit | Attending: General Surgery | Admitting: General Surgery

## 2023-11-25 DIAGNOSIS — K5792 Diverticulitis of intestine, part unspecified, without perforation or abscess without bleeding: Secondary | ICD-10-CM

## 2023-11-25 HISTORY — PX: IR RADIOLOGIST EVAL & MGMT: IMG5224

## 2023-11-25 MED ORDER — IOPAMIDOL (ISOVUE-300) INJECTION 61%
100.0000 mL | Freq: Once | INTRAVENOUS | Status: AC | PRN
  Administered 2023-11-25: 100 mL via INTRAVENOUS

## 2023-11-25 NOTE — Progress Notes (Signed)
 Chief Complaint: Patient was seen in consultation today for  diverticular abscess with possible colovesical fistula; s/p lower abdominal drain placement 5/7   at the request of [name]  Referring Physician(s): [name]  Supervising Physician: Myrlene Asper  History of Present Illness: Olivia [name] is a 68 y.o. 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 Burna Carrier. Patient denies drain concerns. She reports minimal output. Patient has not flushed the drain since she was instructed not to prior to discharge. There has not been drainage from insertion site. Patient denies: fever, chills, chest pain, pain at site, shortness of breath, nausea, vomiting, and/or diarrhea.   Past Medical History:  Diagnosis Date   Diverticulitis    Dyslipidemia    PAD (peripheral artery disease) (HCC)    PONV (postoperative nausea and vomiting)    Psoriasis    RA (rheumatoid arthritis) (HCC)    Vertigo     Past Surgical History:  Procedure Laterality Date   ANEURYSM COILING Right    right toe    BILIARY BRUSHING  05/16/2020   Procedure: BILIARY BRUSHING;  Surgeon: Ruby Corporal, MD;  Location: [facility];  Service: Endoscopy;;   BILIARY BRUSHING  07/26/2020   Procedure: BILIARY BRUSHING;  Surgeon: Janel Medford, MD;  Location: [facility];  Service: Endoscopy;;   BILIARY STENT PLACEMENT  01/05/2020   Procedure: BILIARY STENT PLACEMENT;  Surgeon: Ruby Corporal, MD;  [facility]: [facility];  Service: Endoscopy;;   BILIARY STENT PLACEMENT  05/16/2020   Procedure: BILIARY STENT PLACEMENT;  Surgeon: Ruby Corporal, MD;  Location: [facility];  Service: Endoscopy;;   CHOLECYSTECTOMY     COLONOSCOPY  03/07/2015   Propofol ; Surgeon: Dr. Petel; Three 6 mm rectosigmoid polyps, moderate left-sided  diverticulosis, tortuous rectosigmoid colon. Pathology with inflamed hyperplastic polyp.   COLONOSCOPY WITH PROPOFOL  N/A 08/18/2019   non-bleeding internal hemorrhoids, pancolonic diverticulosis, two 4-7 mm polyps in rectum benign, no adenomas. Next colonoscopy in 10 years.    ENDOSCOPIC RETROGRADE CHOLANGIOPANCREATOGRAPHY (ERCP) WITH PROPOFOL  N/A 07/26/2020   Procedure: ENDOSCOPIC RETROGRADE CHOLANGIOPANCREATOGRAPHY (ERCP) WITH PROPOFOL ;  Surgeon: Janel Medford, MD;  Location: [facility];  Service: Endoscopy;  Laterality: N/A;   ERCP N/A 01/05/2020   Procedure: ENDOSCOPIC RETROGRADE CHOLANGIOPANCREATOGRAPHY (ERCP);  Surgeon: Ruby Corporal, MD;  Location: [facility];  Service: Endoscopy;  Laterality: N/A;   ERCP N/A 05/16/2020   Procedure: ENDOSCOPIC RETROGRADE CHOLANGIOPANCREATOGRAPHY (ERCP);  Surgeon: Ruby Corporal, MD;  Location: [facility];  Service: Endoscopy;  Laterality: N/A;   ESOPHAGOGASTRODUODENOSCOPY (EGD) WITH PROPOFOL  N/A 07/26/2020   Procedure: ESOPHAGOGASTRODUODENOSCOPY (EGD) WITH PROPOFOL ;  Surgeon: Janel Medford, MD;  Location: [facility];  Service: Endoscopy;  Laterality: N/A;   GASTROINTESTINAL STENT REMOVAL N/A 05/16/2020   Procedure: STENT REMOVAL;  Surgeon: Ruby Corporal, MD;  Location: [facility];  Service: Endoscopy;  Laterality: N/A;   HYSTERECTOMY ABDOMINAL WITH SALPINGO-OOPHORECTOMY     ILEOSTOMY  2005   per patient for bowel obstruction and peritonitis with anastomosis   INSERTION OF MESH     RLQ   POLYPECTOMY  08/18/2019   Procedure: POLYPECTOMY;  Surgeon: Suzette Espy, MD;  Location: [facility];  Service: Endoscopy;;   REMOVAL OF STONES N/A 01/05/2020   Procedure: REMOVAL OF STONES;  Surgeon: Ruby Corporal, MD;  [facility];  Service: Endoscopy;  Laterality: N/A;   SPHINCTEROTOMY  N/A 01/05/2020   Procedure: SPHINCTEROTOMY;  Surgeon: Ruby Corporal, MD;  [facility];  Service: Endoscopy;  Laterality: N/A;   SPHINCTEROTOMY N/A  05/16/2020   Procedure: SPHINCTEROTOMY;  Surgeon: Ruby Corporal, MD;  Location: [facility];  Service: Endoscopy;  Laterality: N/A;   SPYGLASS CHOLANGIOSCOPY N/A 07/26/2020   Procedure: ZHYQMVHQ CHOLANGIOSCOPY;  Surgeon: Janel Medford, MD;  Location: [facility];  Service: Endoscopy;  Laterality: N/A;   STENT REMOVAL  07/26/2020   Procedure: STENT REMOVAL;  Surgeon: Janel Medford, MD;  Location: [facility];  Service: Endoscopy;;   UPPER ESOPHAGEAL ENDOSCOPIC ULTRASOUND (EUS) N/A 07/26/2020   Procedure: UPPER ESOPHAGEAL ENDOSCOPIC ULTRASOUND (EUS);  Surgeon: Janel Medford, MD;  Location: [facility];  Service: Endoscopy;  Laterality: N/A;    Allergies: Patient has no known allergies.  Medications: Prior to Admission medications   Medication Sig Start Date End Date Taking? Authorizing Provider  alendronate (FOSAMAX) 70 MG tablet Take 70 mg by mouth every Monday. Take with a full glass of water  on an empty stomach. Patient not taking: Reported on 11/11/2023    [provider]  amiodarone  (PACERONE ) 200 MG tablet Take 200 mg by mouth at bedtime.    [provider]  amLODipine  (NORVASC ) 5 MG tablet Take 5 mg by mouth 2 (two) times daily.    [provider]  atorvastatin  (LIPITOR) 80 MG tablet Take 80 mg by mouth at bedtime.    [provider]  calcium -vitamin D (OSCAL WITH D) 500-5 MG-MCG tablet Take 1 tablet by mouth.    [provider]  cefTRIAXone  (ROCEPHIN ) IVPB Inject 2 g into the vein daily for 21 days. Indication:  Intra-abdominal abscess First Dose: Yes Last Day of Therapy:  12/09/23 Labs - Once weekly:  CBC/D and BMP, ESR and CRP Method of administration: IV Push Method of administration may be changed at the discretion of home infusion pharmacist based upon assessment of the patient and/or caregiver's ability to self-administer the medication ordered. 11/18/23 12/09/23  Orlie Bjornstad, MD  Cyanocobalamin (VITAMIN B 12 PO) Place 3,000 mcg  under the tongue 3 (three) times a week.    [provider]  Cyanocobalamin (VITAMIN B-12 IJ) Inject 1,000 mcg into the muscle once a week. Wednesday    [provider]  Dexlansoprazole  (DEXILANT ) 30 MG capsule DR Take 30 mg by mouth daily as needed (acid reflux).    [provider]  ergocalciferol (VITAMIN D2) 1.25 MG (50000 UT) capsule Take 50,000 Units by mouth once a week. wednesday    [provider]  Magnesium  Citrate (MAGNESIUM  GUMMIES PO) Take 2 tablets by mouth daily.    [provider]  metoprolol  succinate (TOPROL -XL) 50 MG 24 hr tablet Take 50 mg by mouth 2 (two) times daily. 12/12/22   [provider]  metroNIDAZOLE  (FLAGYL ) 500 MG tablet Take 1 tablet (500 mg total) by mouth 2 (two) times daily for 21 days. 11/18/23 12/09/23  DanfordWillis Harter, MD  predniSONE  (DELTASONE ) 5 MG tablet Take 5 mg by mouth daily. 05/12/20   [provider]  SENNA-TIME 8.6 MG tablet Take 2 tablets by mouth as needed for constipation.    [provider]  sodium chloride  flush (NS) 0.9 % SOLN 10 mLs by Intracatheter route as needed (flush). 11/18/23   Danford, Willis Harter, MD  sulfaSALAzine  (AZULFIDINE ) 500 MG tablet Take 500 mg by mouth daily. 01/12/23   [provider]  traMADol  (ULTRAM ) 50 MG tablet Take 1 tablet (  50 mg total) by mouth every 6 (six) hours as needed for up to 7 days for moderate pain (pain score 4-6). 11/18/23 11/25/23  Danford, Willis Harter, MD  XARELTO  20 MG TABS tablet Take 20 mg by mouth daily. 12/12/22   [provider]     Family History  Problem Relation Age of Onset   Lung cancer Mother    Lung cancer Father    Stomach cancer Brother    Breast cancer Cousin    Colon cancer Neg Hx     Social History   Socioeconomic History   Marital status: Married    Spouse name: Not on file   Number of children: Not on file   Years of education: Not on file   Highest education level: Not on file   Occupational History   Not on file  Tobacco Use   Smoking status: Former    Current packs/day: 0.00    Average packs/day: 0.5 packs/day for 50.0 years (25.0 ttl pk-yrs)    Types: Cigarettes    Start date: 08/14/1968    Quit date: 08/14/2018    Years since quitting: 5.2   Smokeless tobacco: Never  Vaping Use   Vaping status: Never Used  Substance and Sexual Activity   Alcohol use: Yes    Comment: "not much" per pt as of 07/03/23   Drug use: Never   Sexual activity: Yes    Birth control/protection: Post-menopausal, Surgical  Other Topics Concern   Not on file  Social History Narrative   Not on file   Social Drivers of Health   Financial Resource Strain: Not on file  Food Insecurity: No Food Insecurity (11/11/2023)   Hunger Vital Sign    Worried About Running Out of Food in the Last Year: Never true    Ran Out of Food in the Last Year: Never true  Transportation Needs: No Transportation Needs (11/11/2023)   PRAPARE - Administrator, Civil Service (Medical): No    Lack of Transportation (Non-Medical): No  Physical Activity: Not on file  Stress: Not on file  Social Connections: Socially Integrated (11/11/2023)   Social Connection and Isolation Panel [NHANES]    Frequency of Communication with Friends and Family: Three times a week    Frequency of Social Gatherings with Friends and Family: Once a week    Attends Religious Services: 1 to 4 times per year    Active Member of [employer] or Organizations: Yes    Attends Banker Meetings: 1 to 4 times per year    Marital Status: Married      Review of Systems: A 12 point ROS discussed and pertinent positives are indicated in the HPI above.  All other systems are negative.  Review of Systems  Vital Signs: There were no vitals taken for this visit.  Physical Exam Pulmonary:     Effort: Pulmonary effort is normal.  Musculoskeletal:     Cervical back: Normal range of motion.  Skin:    General: Skin is warm.      Comments: Pelvic drain in place with minimal green tinged output present in gravity bag. No erythema, edema or discharge at insertion site. No TTP. Mild skin erythema present at previous bandage site.   Neurological:     General: No focal deficit present.     Mental Status: She is alert and oriented to person, place, and time.     Mallampati Score:     Imaging: US  EKG SITE RITE  Result Date: 11/18/2023 If Site Rite image not attached, placement could not be confirmed due to current cardiac rhythm.  CT ABDOMEN PELVIS W CONTRAST Result Date: 11/15/2023 CLINICAL DATA:  Sigmoid diverticulitis with abscess formation and status post percutaneous catheter drainage diverticular abscess on 11/11/2023. EXAM: CT ABDOMEN AND PELVIS WITH CONTRAST TECHNIQUE: Multidetector CT imaging of the abdomen and pelvis was performed using the standard protocol following bolus administration of intravenous contrast. RADIATION DOSE REDUCTION: This exam was performed according to the departmental dose-optimization program which includes automated exposure control, adjustment of the mA and/or kV according to patient size and/or use of iterative reconstruction technique. CONTRAST:  OMNIPAQUE  IOHEXOL  300 MG/ML  SOLN COMPARISON:  11/11/2023 FINDINGS: Lower chest: No acute abnormality. Hepatobiliary: Unremarkable liver. There is air in the biliary tree and gallbladder likely related to prior sphincterotomy. Pancreas: Unremarkable. No pancreatic ductal dilatation or surrounding inflammatory changes. Spleen: Normal in size without focal abnormality. Adrenals/Urinary Tract: Adrenal glands are unremarkable. Kidneys are normal, without renal calculi, focal lesion, or hydronephrosis. Bladder is unremarkable. Stomach/Bowel: No bowel obstruction or significant ileus. Normal appendix. Inflammation of the sigmoid colon related to acute diverticulitis appears slightly improved compared to the prior study. Percutaneous drain is  positioned within the diverticular abscess located just inferior to the sigmoid colon and superior to the bladder. The abscess has been completely decompressed with no significant fluid remaining. Vascular/Lymphatic: Stable atherosclerosis of the abdominal aorta without aneurysm. No lymphadenopathy identified. Reproductive: Status post hysterectomy. No adnexal masses. Other: Ventral abdominal wall hernia mesh without evidence abdominal wall hernia. No abdominopelvic ascites. Musculoskeletal: No acute or significant osseous findings. IMPRESSION: 1. Percutaneous drain is positioned within the diverticular abscess located just inferior to the sigmoid colon and superior to the bladder. The abscess has been completely decompressed with no significant fluid remaining. 2. Inflammation of the sigmoid colon related to acute diverticulitis appears slightly improved compared to the prior study. 3. Air in the biliary tree and gallbladder likely related to prior sphincterotomy. 4. Stable atherosclerosis of the abdominal aorta without aneurysm. 5. Ventral abdominal wall hernia mesh without evidence of abdominal wall hernia. Aortic Atherosclerosis (ICD10-I70.0). Electronically Signed   By: Erica Hau M.D.   On: 11/15/2023 16:00   CT GUIDED PERITONEAL/RETROPERITONEAL FLUID DRAIN BY PERC CATH Result Date: 11/11/2023 INDICATION: Pelvic diverticular abscess EXAM: CT-guided drainage catheter placement TECHNIQUE: Multidetector CT imaging of the pelvis was performed for evaluation and to allow targeting of the central diverticular abscess. RADIATION DOSE REDUCTION: This exam was performed according to the departmental dose-optimization program which includes automated exposure control, adjustment of the mA and/or kV according to patient size and/or use of iterative reconstruction technique. MEDICATIONS: The patient is currently admitted to the hospital and receiving intravenous antibiotics. The antibiotics were administered within  an appropriate time frame prior to the initiation of the procedure. ANESTHESIA/SEDATION: Moderate (conscious) sedation was employed during this procedure. A total of Versed  2 mg and Fentanyl  100 mcg was administered intravenously by the radiology nurse. Total intra-service moderate Sedation Time: 27 minutes. The patient's level of consciousness and vital signs were monitored continuously by radiology nursing throughout the procedure under my direct supervision. COMPLICATIONS: None immediate. PROCEDURE: Informed written consent was obtained from the patient after a thorough discussion of the procedural risks, benefits and alternatives. All questions were addressed. Maximal Sterile Barrier Technique was utilized including caps, mask, sterile gowns, sterile gloves, sterile drape, hand hygiene and skin antiseptic. A timeout was performed prior to the initiation of the procedure. In a supine  position, radiopaque markers were placed on the patient's skin and the pelvis was imaged using CT guidance. The skin was then marked and sterilely prepped with ChloraPrep. Local anesthesia was achieved with 1% lidocaine . Lidocaine  was infiltrated from the skin to the abdominal rectus. A 7 cm Yueh needle was then advanced from the skin incision to the midpoint of the pelvic abscess. The needle was then withdrawn leaving the sheath. An Amplatz wire was then advanced through the sheath. Access was then dilated using a 10 French fascial dilator. The dilator was removed and a 10 French pigtail catheter was advanced over the guidewire and coiled within the abscess. Guidewire was removed. Retention suture and sterile dressing were applied. The catheter was connected to a JP bulb. IMPRESSION: Satisfactory placement of a 10 French pelvic abscess drain. Electronically Signed   By: Susan Ensign   On: 11/11/2023 15:52   CT ABDOMEN PELVIS W CONTRAST Result Date: 11/11/2023 CLINICAL DATA:  Left lower quadrant abdominal pain for 1 week.  Worsens with bowel movement. History of diverticulitis. EXAM: CT ABDOMEN AND PELVIS WITH CONTRAST TECHNIQUE: Multidetector CT imaging of the abdomen and pelvis was performed using the standard protocol following bolus administration of intravenous contrast. RADIATION DOSE REDUCTION: This exam was performed according to the departmental dose-optimization program which includes automated exposure control, adjustment of the mA and/or kV according to patient size and/or use of iterative reconstruction technique. CONTRAST:  OMNIPAQUE  IOHEXOL  300 MG/ML  SOLN COMPARISON:  CT abdomen pelvis 11/18/2022 FINDINGS: Lower chest: No acute abnormality. Hepatobiliary: No focal hepatic lesion. Fluid and gas containing structure in the porta hepatis may be a remnant gallbladder stump as this communicates with the biliary tree. This is unchanged from 11/18/2022. Per chart review the patient is status post cholecystectomy. Pneumobilia. Unchanged prominence of the common bile duct measuring 10 mm in diameter. Pancreas: Unremarkable. Spleen: Unremarkable. Adrenals/Urinary Tract: Normal adrenal glands. Punctate nonobstructing stones bilaterally. No hydronephrosis. Urothelial thickening about the mid left ureter is likely reactive secondary to adjacent diverticulitis. Gas is present within the bladder. Bladder wall thickening in the dome of the bladder. Stomach/Bowel: Stomach is within normal limits. Duodenal diverticulum. No bowel obstruction. Normal appendix. Extensive sigmoid and descending colon diverticulosis. There is marked wall thickening with adjacent inflammatory fat stranding and pericolonic fluid about the sigmoid colon. Peripherally enhancing irregular gas and fluid collection between the inflamed portions of the sigmoid colon in the dome of the bladder measuring 4.4 x 3.3 x 2.4 cm (series 2/image 69 and 7/69). Fluid-filled tracts extend between the sigmoid colon in the bladder. Along with the gas in the bladder this is  highly suspicious for colovesical fistula with possible additional colocolic fistula. The gas and fluid collection extends posteriorly to the vaginal cuff (series 9/image 98). Small locule of gas in the vagina. Colovaginal fistula is difficult to exclude. Vascular/Lymphatic: Aortic atherosclerosis. No enlarged abdominal or pelvic lymph nodes. Reproductive: Hysterectomy. See above for discussion about possible colo vaginal fistula. No adnexal mass. Other: Ventral abdominal wall hernia repair. Musculoskeletal: No acute fracture. IMPRESSION: Complicated sigmoid diverticulitis with 4.4 cm gas containing abscess between the sigmoid colon and bladder. 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 Soft tissue tracts extend between portions of the sigmoid colon as well as to the vaginal cuff suspicious for additional colocolic and colovaginal fistulas. Aortic Atherosclerosis (ICD10-I70.0). Electronically Signed   By: Rozell Cornet M.D.   On: 11/11/2023 01:51    Labs:  CBC: Recent Labs  11/15/23 0530 11/16/23 0433 11/17/23 0451 11/18/23 0511  WBC 15.5* 16.3* 19.5* 16.0*  HGB 11.9* 11.9* 12.5 12.2  HCT 38.3 38.3 40.0 40.0  PLT 595* 623* 686* 674*    COAGS: Recent Labs    11/11/23 0512 11/12/23 1856  INR 1.0  --   APTT  --  48*    BMP: Recent Labs    11/15/23 0530 11/16/23 0433 11/17/23 0451 11/18/23 0511  NA 139 135 135 136  K 3.6 3.3* 3.7 3.7  CL 107 102 104 106  CO2 23 24 23  19*  GLUCOSE 93 90 66* 83  BUN <5* 5* 6* 8  CALCIUM  8.5* 8.4* 8.2* 8.5*  CREATININE 0.68 0.79 0.78 0.68  GFRNONAA >60 >60 >60 >60    LIVER FUNCTION TESTS: Recent Labs    07/03/23 1416 11/10/23 1939 11/11/23 0512 11/12/23 0451 11/13/23 0452 11/15/23 0530 11/16/23 0433 11/17/23 0451 11/18/23 0511  BILITOT 0.6 0.6 0.6 0.7  --   --   --   --   --   AST 18 18 14* 18  --   --   --   --   --   ALT 21 18 15 17   --   --   --   --   --   ALKPHOS 65 66 57 52  --   --   --   --   --    PROT 6.8 7.5 6.2* 5.5*  --   --   --   --   --   ALBUMIN 3.4* 3.3* 2.8* 2.4*   < > 2.6* 2.8* 2.8* 2.6*   < > = values in this interval not displayed.    TUMOR MARKERS: No results for input(s): "AFPTM", "CEA", "CA199", "CHROMGRNA" in the last 8760 hours.  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 Wilnette Haste, drain removal recommended. Pelvic drain removed today without complications. Sterile dressing placed over site. Patient advised to keep covered for 24-48 hours and shower when the hole is closed. Patient encouraged to follow up with general surgery. Her appointment is scheduled 12/02/23.  Patient encouraged to follow up after visiting with general surgery. Contact IR with questions/concerns.    Electronically Signed: Lawrance Presume PA-C 11/25/2023, 10:19 AM   Please refer to Dr. Wilnette Haste attestation of this note for management and plan.

## 2023-11-27 ENCOUNTER — Other Ambulatory Visit

## 2023-12-08 ENCOUNTER — Other Ambulatory Visit: Payer: Self-pay

## 2023-12-08 ENCOUNTER — Ambulatory Visit (INDEPENDENT_AMBULATORY_CARE_PROVIDER_SITE_OTHER): Admitting: Infectious Diseases

## 2023-12-08 ENCOUNTER — Telehealth: Payer: Self-pay

## 2023-12-08 VITALS — BP 132/80 | HR 55 | Temp 97.7°F | Ht 63.0 in | Wt 165.0 lb

## 2023-12-08 DIAGNOSIS — Z452 Encounter for adjustment and management of vascular access device: Secondary | ICD-10-CM | POA: Diagnosis not present

## 2023-12-08 DIAGNOSIS — B999 Unspecified infectious disease: Secondary | ICD-10-CM | POA: Insufficient documentation

## 2023-12-08 DIAGNOSIS — Z79899 Other long term (current) drug therapy: Secondary | ICD-10-CM | POA: Insufficient documentation

## 2023-12-08 NOTE — Progress Notes (Unsigned)
 Patient Active Problem List   Diagnosis Date Noted   Paroxysmal A-fib (HCC) 11/12/2023   Acute diverticulitis 11/11/2023   Diverticulitis 09/18/2022   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   AVM (arteriovenous malformation) of small bowel, 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   Chronic gastritis 06/27/2022   Melena    GI bleed 05/25/2020   Leukocytosis 05/25/2020   Thrombocytosis 05/25/2020   Normocytic anemia 05/25/2020   GERD (gastroesophageal reflux disease) 05/25/2020   Dyslipidemia 05/25/2020   Vitamin D deficiency 05/25/2020   Choledocholithiasis 12/26/2019   RUQ pain 09/27/2019   Neck mass 09/27/2019   Elevated LFTs 05/02/2019   Fatty liver 05/02/2019   Rectal bleeding 05/02/2019   Peripheral arterial disease (HCC) 11/14/2013   Palpitations 05/20/2011   Tobacco abuse disorder 05/20/2011   Panic disorder without agoraphobia 07/13/2007    Patient's Medications  New Prescriptions   No medications on file  Previous Medications   ALENDRONATE (FOSAMAX) 70 MG TABLET    Take 70 mg by mouth every Monday. Take with a full glass of water  on an empty stomach.   AMIODARONE  (PACERONE ) 200 MG TABLET    Take 200 mg by mouth at bedtime.   AMLODIPINE  (NORVASC ) 5 MG TABLET    Take 5 mg by mouth 2 (two) times daily.   ATORVASTATIN  (LIPITOR) 80 MG TABLET    Take 80 mg by mouth at bedtime.   CALCIUM -VITAMIN D (OSCAL WITH D) 500-5 MG-MCG TABLET    Take 1 tablet by mouth.   CEFTRIAXONE  (ROCEPHIN ) IVPB    Inject 2 g into the vein daily for 21 days. Indication:  Intra-abdominal abscess First Dose: Yes Last Day of Therapy:  12/09/23 Labs - Once weekly:  CBC/D and BMP, ESR and CRP Method of administration: IV Push Method of administration may be changed at the discretion of home infusion pharmacist based upon assessment of the patient and/or caregiver's ability to self-administer the medication ordered.   CYANOCOBALAMIN (VITAMIN B 12 PO)    Place 3,000 mcg under the tongue 3 (three) times a week.    CYANOCOBALAMIN (VITAMIN B-12 IJ)    Inject 1,000 mcg into the muscle once a week. Wednesday   DEXLANSOPRAZOLE  (DEXILANT ) 30 MG CAPSULE DR    Take 30 mg by mouth daily as needed (acid reflux).   ERGOCALCIFEROL (VITAMIN D2) 1.25 MG (50000 UT) CAPSULE    Take 50,000 Units by mouth once a week. wednesday   MAGNESIUM  CITRATE (MAGNESIUM  GUMMIES PO)    Take 2 tablets by mouth daily.   METOPROLOL  SUCCINATE (TOPROL -XL) 50 MG 24 HR TABLET    Take 50 mg by mouth 2 (two) times daily.   METRONIDAZOLE  (FLAGYL ) 500 MG TABLET    Take 1 tablet (500 mg total) by mouth 2 (two) times daily for 21 days.   PREDNISONE  (DELTASONE ) 5 MG TABLET    Take 5 mg by mouth daily.   SENNA-TIME 8.6 MG TABLET    Take 2 tablets by mouth as needed for constipation.   SODIUM CHLORIDE  FLUSH (NS) 0.9 % SOLN    10 mLs by Intracatheter route as needed (flush).   SULFASALAZINE  (AZULFIDINE ) 500 MG TABLET    Take 500 mg by mouth daily.   XARELTO  20 MG TABS TABLET    Take 20 mg by mouth daily.  Modified Medications   No medications on file  Discontinued Medications   No medications on file    Subjective Discussed the use of AI scribe software for clinical note transcription with the patient, who gave  verbal consent to proceed.   68 Y O Female with prior h/o RA ( on prednisone  and sulfasalazine , declines having Psoriasis), PAD, dyslipidemia, GERD, A fib, h/o ERCP and biliary stent placement s/p removal and h/o diverticulitis with transverse colon perforation/peritonitis requiring ostomy with subsequent takedown with multiple recurrence last year, recent admission at [facility] ( discharged on 4/14 and was taking Cipro  for UTI) ago is here for HFU after recent admission 5/6-5/14 for recurrent diverticulitis/abscess. N o surgical intervention per surgery. 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  S/p PERC drain placement on 5/7. Initially on IV zosyn  which was later switched to IV ceftriaxone  and po metronidazole    once cx finalized as E coli due to no feasible PO options  and plan for 4 weeks through 6/4 via PICC.   6/3 Accompanied by family member. Reports getting IV ceftriaxone  via PICC and PO metronidazole  without any concerns. Denies fevers, chills, nausea, vomiting, abdominal pain or abnormal Bms. She reports being tentatively planned for surgery on June 16th. She had a repeat CT done on 5/21 and prior drain removed.   She reports stool passing through her vagina, with more stool exiting vaginally than rectally. She is concerned about the risk of a urinary tract infection due to stool passing near her bladder. labs from [facility] requested  Review of Systems: all systems reviewed with pertinent positives and negatives as listed above   Past Medical History:  Diagnosis Date   Diverticulitis    Dyslipidemia    PAD (peripheral artery disease) (HCC)    PONV (postoperative nausea and vomiting)    Psoriasis    RA (rheumatoid arthritis) (HCC)    Vertigo    Past Surgical History:  Procedure Laterality Date   ANEURYSM COILING Right    right toe    BILIARY BRUSHING  05/16/2020   Procedure: BILIARY BRUSHING;  Surgeon: Ruby Corporal, MD;  Location: [facility];  Service: Endoscopy;;   BILIARY BRUSHING  07/26/2020   Procedure: BILIARY BRUSHING;  Surgeon: Janel Medford, MD;  [facility]: [facility];  Service: Endoscopy;;   BILIARY STENT PLACEMENT  01/05/2020   Procedure: BILIARY STENT PLACEMENT;  Surgeon: Ruby Corporal, MD;  [facility];  Service: Endoscopy;;   BILIARY STENT PLACEMENT  05/16/2020   Procedure: BILIARY STENT PLACEMENT;  Surgeon: Ruby Corporal, MD;  Location: [facility];  Service: Endoscopy;;   CHOLECYSTECTOMY     COLONOSCOPY  03/07/2015   Propofol ; Surgeon: Dr. Petel; Three 6 mm rectosigmoid polyps, moderate left-sided diverticulosis, tortuous rectosigmoid colon. Pathology with inflamed hyperplastic polyp.   COLONOSCOPY WITH PROPOFOL  N/A 08/18/2019   non-bleeding internal  hemorrhoids, pancolonic diverticulosis, two 4-7 mm polyps in rectum benign, no adenomas. Next colonoscopy in 10 years.    ENDOSCOPIC RETROGRADE CHOLANGIOPANCREATOGRAPHY (ERCP) WITH PROPOFOL  N/A 07/26/2020   Procedure: ENDOSCOPIC RETROGRADE CHOLANGIOPANCREATOGRAPHY (ERCP) WITH PROPOFOL ;  Surgeon: Janel Medford, MD;  [facility];  Service: Endoscopy;  Laterality: N/A;   ERCP N/A 01/05/2020   Procedure: ENDOSCOPIC RETROGRADE CHOLANGIOPANCREATOGRAPHY (ERCP);  Surgeon: Ruby Corporal, MD;  [facility];  Service: Endoscopy;  Laterality: N/A;   ERCP N/A 05/16/2020   Procedure: ENDOSCOPIC RETROGRADE CHOLANGIOPANCREATOGRAPHY (ERCP);  Surgeon: Ruby Corporal, MD;  Location: [facility];  Service: Endoscopy;  Laterality: N/A;   ESOPHAGOGASTRODUODENOSCOPY (EGD) WITH PROPOFOL  N/A 07/26/2020   Procedure: ESOPHAGOGASTRODUODENOSCOPY (EGD) WITH PROPOFOL ;  Surgeon: Janel Medford, MD;  [facility];  Service: Endoscopy;  Laterality: N/A;  GASTROINTESTINAL STENT REMOVAL N/A 05/16/2020   Procedure: STENT REMOVAL;  Surgeon: Ruby Corporal, MD;  [facility];  Service: Endoscopy;  Laterality: N/A;   HYSTERECTOMY ABDOMINAL WITH SALPINGO-OOPHORECTOMY     ILEOSTOMY  2005   per patient for bowel obstruction and peritonitis with anastomosis   INSERTION OF MESH     RLQ   IR RADIOLOGIST EVAL & MGMT  11/25/2023   POLYPECTOMY  08/18/2019   Procedure: POLYPECTOMY;  Surgeon: Suzette Espy, MD;  [facility];  Service: Endoscopy;;   REMOVAL OF STONES N/A 01/05/2020   Procedure: REMOVAL OF STONES;  Surgeon: Ruby Corporal, MD;  [facility];  Service: Endoscopy;  Laterality: N/A;   SPHINCTEROTOMY N/A 01/05/2020   Procedure: SPHINCTEROTOMY;  Surgeon: Ruby Corporal, MD;  [facility];  Service: Endoscopy;  Laterality: N/A;   SPHINCTEROTOMY N/A 05/16/2020   Procedure: SPHINCTEROTOMY;  Surgeon: Ruby Corporal, MD;  [facility];  Service: Endoscopy;   Laterality: N/A;   SPYGLASS CHOLANGIOSCOPY N/A 07/26/2020   Procedure: ZOXWRUEA CHOLANGIOSCOPY;  Surgeon: Janel Medford, MD;  [facility];  Service: Endoscopy;  Laterality: N/A;   STENT REMOVAL  07/26/2020   Procedure: STENT REMOVAL;  Surgeon: Janel Medford, MD;  [facility];  Service: Endoscopy;;   UPPER ESOPHAGEAL ENDOSCOPIC ULTRASOUND (EUS) N/A 07/26/2020   Procedure: UPPER ESOPHAGEAL ENDOSCOPIC ULTRASOUND (EUS);  Surgeon: Janel Medford, MD;  Location: [facility];  Service: Endoscopy;  Laterality: N/A;    Social History   Tobacco Use   Smoking status: Former    Current packs/day: 0.00    Average packs/day: 0.5 packs/day for 50.0 years (25.0 ttl pk-yrs)    Types: Cigarettes    Start date: 08/14/1968    Quit date: 08/14/2018    Years since quitting: 5.3   Smokeless tobacco: Never  Vaping Use   Vaping status: Never Used  Substance Use Topics   Alcohol use: Yes    Comment: "not much" per pt as of 07/03/23   Drug use: Never    Family History  Problem Relation Age of Onset   Lung cancer Mother    Lung cancer Father    Stomach cancer Brother    Breast cancer Cousin    Colon cancer Neg Hx     Allergies  Allergen Reactions   Codeine     Headache-"draws line across forehead"    Health Maintenance  Topic Date Due   Medicare Annual Wellness (AWV)  Never done   DTaP/Tdap/Td (1 - Tdap) Never done   Lung Cancer Screening  Never done   Pneumonia Vaccine 70+ Years old (1 of 1 - PCV) Never done   MAMMOGRAM  Never done   DEXA SCAN  Never done   COVID-19 Vaccine (3 - Moderna risk series) 11/04/2019   Zoster Vaccines- Shingrix (2 of 2) 06/11/2022   INFLUENZA VACCINE  02/05/2024   Colonoscopy  08/17/2029   Hepatitis C Screening  Completed   HPV VACCINES  Aged Out   Meningococcal B Vaccine  Aged Out    Objective:  Vitals:   12/08/23 1023  BP: 132/80  Pulse: (!) 55  Temp: 97.7 F (36.5 C)  TempSrc: Temporal  SpO2: 96%  Weight: 165 lb (74.8  kg)  Height: 5\' 3"  (1.6 m)   Body mass index is 29.23 kg/m.  Physical Exam Constitutional:      Appearance: Normal appearance.  HENT:     Head: Normocephalic and atraumatic.  Mouth: Mucous membranes are moist.  Eyes:    Conjunctiva/sclera: Conjunctivae normal.     Pupils: Pupils are equal, round, and b/l symmetrical    Cardiovascular:     Rate and Rhythm: Normal rate and Irregular rhythm.     Heart sounds: s1s2  Pulmonary:     Effort: Pulmonary effort is normal.     Breath sounds: Normal breath sounds.   Abdominal:     General: non distended     Palpations: soft, non tender   Musculoskeletal:        General: Normal range of motion.   Skin:    General: Skin is warm and dry.     Comments: RT arm PICC with no concerns   Neurological:     General: grossly non focal     Mental Status: awake, alert and oriented to person, place, and time.   Psychiatric:        Mood and Affect: Mood normal.   Lab Results Lab Results  Component Value Date   WBC 16.0 (H) 11/18/2023   HGB 12.2 11/18/2023   HCT 40.0 11/18/2023   MCV 96.4 11/18/2023   PLT 674 (H) 11/18/2023    Lab Results  Component Value Date   CREATININE 0.68 11/18/2023   BUN 8 11/18/2023   NA 136 11/18/2023   K 3.7 11/18/2023   CL 106 11/18/2023   CO2 19 (L) 11/18/2023    Lab Results  Component Value Date   ALT 17 11/12/2023   AST 18 11/12/2023   ALKPHOS 52 11/12/2023   BILITOT 0.7 11/12/2023    No results found for: "CHOL", "HDL", "LDLCALC", "LDLDIRECT", "TRIG", "CHOLHDL" Lab Results  Component Value Date   LABRPR NON REACTIVE 11/18/2022   No results found for: "HIV1RNAQUANT", "HIV1RNAVL", "CD4TABS"   Microbiology  Results for orders placed or performed during the hospital encounter of 11/10/23  Body fluid culture w Gram Stain     Status: None   Collection Time: 11/12/23 12:12 PM   Specimen: Pelvis; Body Fluid  Result Value Ref Range Status   Specimen Description   Final     PELVIS Performed at [facility], [address] [place], [place] [address]    Special Requests   Final    Normal Performed at [facility], [address], [place], [place] [address]    Gram Stain   Final    ABUNDANT WBC PRESENT, PREDOMINANTLY PMN NO ORGANISMS SEEN Performed at [facility], [address], [place], [place] [address]    Culture RARE ESCHERICHIA COLI  Final   Report Status 11/18/2023 FINAL  Final   Organism ID, Bacteria ESCHERICHIA COLI  Final   Organism ID, Bacteria ESCHERICHIA COLI  Final      Susceptibility   Escherichia coli - MIC*    AMPICILLIN >=32 RESISTANT Resistant     CEFEPIME <=0.12 SENSITIVE Sensitive     CEFTAZIDIME <=1 SENSITIVE Sensitive     CEFTRIAXONE  <=0.25 SENSITIVE Sensitive     CIPROFLOXACIN  >=4 RESISTANT Resistant     GENTAMICIN <=1 SENSITIVE Sensitive     IMIPENEM <=0.25 SENSITIVE Sensitive     TRIMETH/SULFA >=320 RESISTANT Resistant     AMPICILLIN/SULBACTAM >=32 RESISTANT Resistant     PIP/TAZO <=4 SENSITIVE Sensitive ug/mL   Escherichia coli - KIRBY BAUER*    CEFAZOLIN  INTERMEDIATE Intermediate     * RARE ESCHERICHIA COLI    RARE ESCHERICHIA COLI  Urine Culture     Status: Abnormal  Collection Time: 11/17/23 12:04 PM   Specimen: Urine, Random  Result Value Ref Range Status   Specimen Description   Final    URINE, RANDOM Performed at [facility], [address] [place], [place] [address]    Special Requests   Final    NONE Reflexed from [id number] Performed at [facility], [address] [place], [place] [address]    Culture MULTIPLE SPECIES PRESENT, SUGGEST RECOLLECTION (A)  Final   Report Status 11/18/2023 FINAL  Final   Imaging  CT ABDOMEN PELVIS W CONTRAST Result Date: 11/25/2023 CLINICAL DATA:  68 year old female with a history diverticulitis, prior abscess drainage EXAM: CT ABDOMEN AND PELVIS WITH CONTRAST TECHNIQUE: Multidetector CT  imaging of the abdomen and pelvis was performed using the standard protocol following bolus administration of intravenous contrast. RADIATION DOSE REDUCTION: This exam was performed according to the departmental dose-optimization program which includes automated exposure control, adjustment of the mA and/or kV according to patient size and/or use of iterative reconstruction technique. CONTRAST:  100mL ISOVUE -300 IOPAMIDOL  (ISOVUE -300) INJECTION 61% COMPARISON:  11/15/2023 FINDINGS: Lower chest: No acute finding Hepatobiliary: Liver parenchyma unremarkable. There is some persisting gas within the gallbladder in anti dependent biliary system, left lobe, representing prior sphincterotomy. Duodenal diverticulum. Pancreas: Unremarkable Spleen: Unremarkable Adrenals/Urinary Tract: - Right adrenal gland:  Unremarkable - Left adrenal gland: Unremarkable. - Right kidney: No hydronephrosis, nephrolithiasis, inflammation, or ureteral dilation. There are several small hypoenhancing lesions which are too small to characterize. - Left Kidney: No hydronephrosis, nephrolithiasis, inflammation, or ureteral dilation. There are several small hypoenhancing lesions which are too small to characterize. - Urinary Bladder: Urinary bladder again demonstrates air-fluid level, partially distended. The left aspect of the urinary bladder is inseparable from the inflammatory process involving:. The small persisting abscess, from which the drain is been withdrawn, is immediately adjacent to the urinary bladder. On the coronal images there is suspicion of a direct communication of the abscess cavity with the urinary bladder. Stomach/Bowel: - Stomach: Unremarkable. - Small bowel: Unremarkable - Appendix: Normal. - Colon: Unremarkable proximal colon, with formed stool. No evidence of obstruction. Diverticular change of the descending colon. There is persisting inflammatory change surrounding the sigmoid colon with extensive diverticular disease. The  previous drain has been withdrawn into the abdominal wall musculature. Small residual abscess cavity on the left aspect of the urinary bladder, caudal to the sigmoid colon. This measures approximately 2.7 cm x 1.8 cm. Stranding within the fat. Vascular/Lymphatic: Atherosclerotic changes of the abdominal aorta. Mesenteric arteries are patent. Renal arteries are patent. Iliac and proximal femoral arteries patent. No mesenteric adenopathy. No inguinal adenopathy. Small nodes in the periaortic nodal station likely reactive. Reproductive: Surgical changes of hysterectomy. The cuff of prior hysterectomy site is contiguous with the residual abscess via the conglomeration of inflammatory changes and is the likely site of fistulous connection evident by clinical status. Other: Surgical changes of ventral hernia repair. The previous pigtail catheter has been withdrawn into the abdominal wall musculature. Musculoskeletal: No displaced fracture. Degenerative changes of the spine. IMPRESSION: The previous pigtail drainage catheter has been withdrawn into the abdominal wall musculature, displaced from the pelvic abscess. Persisting extensive inflammatory changes adjacent to the sigmoid colon, contiguous with the small abscess to the left of the urinary bladder, and inseparable from both the left aspect of the urinary bladder as well as the surgical cuff of prior hysterectomy site. These 2 locations most likely represent the site of fistulous connection to both urinary bladder and vaginal  cuff as is evident clinically. Small abscess persists near the presumed sites of fistulous connection, from where the percutaneous drain has been withdrawn. Greatest dimension 2.7 cm. Aortic atherosclerosis.  Aortic Atherosclerosis (ICD10-I70.0). Additional ancillary findings as above. Signed, Marciano Settles. Rexine Cater, RPVI [facility] Rad[facility] Electronically Signed   By: Myrlene Asper D.O.   On:  11/25/2023 12:13   IR Radiologist Eval & Mgmt Result Date: 11/25/2023 EXAM: NEW PATIENT OFFICE VISIT CHIEF COMPLAINT: Electronic medical record HISTORY OF PRESENT ILLNESS: Electronic medical record REVIEW OF SYSTEMS: Electronic medical record PHYSICAL EXAMINATION: Electronic medical record ASSESSMENT AND PLAN: Electronic medical record Electronically Signed   By: Myrlene Asper D.O.   On: 11/25/2023 11:46   US  EKG SITE RITE Result Date: 11/18/2023 If Site Rite image not attached, placement could not be confirmed due to current cardiac rhythm.  CT ABDOMEN PELVIS W CONTRAST Result Date: 11/15/2023 CLINICAL DATA:  Sigmoid diverticulitis with abscess formation and status post percutaneous catheter drainage diverticular abscess on 11/11/2023. EXAM: CT ABDOMEN AND PELVIS WITH CONTRAST TECHNIQUE: Multidetector CT imaging of the abdomen and pelvis was performed using the standard protocol following bolus administration of intravenous contrast. RADIATION DOSE REDUCTION: This exam was performed according to the departmental dose-optimization program which includes automated exposure control, adjustment of the mA and/or kV according to patient size and/or use of iterative reconstruction technique. CONTRAST:  OMNIPAQUE  IOHEXOL  300 MG/ML  SOLN COMPARISON:  11/11/2023 FINDINGS: Lower chest: No acute abnormality. Hepatobiliary: Unremarkable liver. There is air in the biliary tree and gallbladder likely related to prior sphincterotomy. Pancreas: Unremarkable. No pancreatic ductal dilatation or surrounding inflammatory changes. Spleen: Normal in size without focal abnormality. Adrenals/Urinary Tract: Adrenal glands are unremarkable. Kidneys are normal, without renal calculi, focal lesion, or hydronephrosis. Bladder is unremarkable. Stomach/Bowel: No bowel obstruction or significant ileus. Normal appendix. Inflammation of the sigmoid colon related to acute diverticulitis appears slightly improved compared to the prior  study. Percutaneous drain is positioned within the diverticular abscess located just inferior to the sigmoid colon and superior to the bladder. The abscess has been completely decompressed with no significant fluid remaining. Vascular/Lymphatic: Stable atherosclerosis of the abdominal aorta without aneurysm. No lymphadenopathy identified. Reproductive: Status post hysterectomy. No adnexal masses. Other: Ventral abdominal wall hernia mesh without evidence abdominal wall hernia. No abdominopelvic ascites. Musculoskeletal: No acute or significant osseous findings. IMPRESSION: 1. Percutaneous drain is positioned within the diverticular abscess located just inferior to the sigmoid colon and superior to the bladder. The abscess has been completely decompressed with no significant fluid remaining. 2. Inflammation of the sigmoid colon related to acute diverticulitis appears slightly improved compared to the prior study. 3. Air in the biliary tree and gallbladder likely related to prior sphincterotomy. 4. Stable atherosclerosis of the abdominal aorta without aneurysm. 5. Ventral abdominal wall hernia mesh without evidence of abdominal wall hernia. Aortic Atherosclerosis (ICD10-I70.0). Electronically Signed   By: Erica Hau M.D.   On: 11/15/2023 16:00   CT GUIDED PERITONEAL/RETROPERITONEAL FLUID DRAIN BY PERC CATH Result Date: 11/11/2023 INDICATION: Pelvic diverticular abscess EXAM: CT-guided drainage catheter placement TECHNIQUE: Multidetector CT imaging of the pelvis was performed for evaluation and to allow targeting of the central diverticular abscess. RADIATION DOSE REDUCTION: This exam was performed according to the departmental dose-optimization program which includes automated exposure control, adjustment of the mA and/or kV according to patient size and/or use of iterative reconstruction technique. MEDICATIONS: The patient is currently admitted to the hospital and receiving intravenous antibiotics. The  antibiotics were administered within an appropriate time frame prior to the initiation of the procedure. ANESTHESIA/SEDATION: Moderate (conscious) sedation was employed during this procedure. A total of Versed  2 mg and Fentanyl  100 mcg was administered intravenously by the radiology nurse. Total intra-service moderate Sedation Time: 27 minutes. The patient's level of consciousness and vital signs were monitored continuously by radiology nursing throughout the procedure under my direct supervision. COMPLICATIONS: None immediate. PROCEDURE: Informed written consent was obtained from the patient after a thorough discussion of the procedural risks, benefits and alternatives. All questions were addressed. Maximal Sterile Barrier Technique was utilized including caps, mask, sterile gowns, sterile gloves, sterile drape, hand hygiene and skin antiseptic. A timeout was performed prior to the initiation of the procedure. In a supine position, radiopaque markers were placed on the patient's skin and the pelvis was imaged using CT guidance. The skin was then marked and sterilely prepped with ChloraPrep. Local anesthesia was achieved with 1% lidocaine . Lidocaine  was infiltrated from the skin to the abdominal rectus. A 7 cm Yueh needle was then advanced from the skin incision to the midpoint of the pelvic abscess. The needle was then withdrawn leaving the sheath. An Amplatz wire was then advanced through the sheath. Access was then dilated using a 10 French fascial dilator. The dilator was removed and a 10 French pigtail catheter was advanced over the guidewire and coiled within the abscess. Guidewire was removed. Retention suture and sterile dressing were applied. The catheter was connected to a JP bulb. IMPRESSION: Satisfactory placement of a 10 French pelvic abscess drain. Electronically Signed   By: Susan Ensign   On: 11/11/2023 15:52   CT ABDOMEN PELVIS W CONTRAST Result Date: 11/11/2023 CLINICAL DATA:  Left lower  quadrant abdominal pain for 1 week. Worsens with bowel movement. History of diverticulitis. EXAM: CT ABDOMEN AND PELVIS WITH CONTRAST TECHNIQUE: Multidetector CT imaging of the abdomen and pelvis was performed using the standard protocol following bolus administration of intravenous contrast. RADIATION DOSE REDUCTION: This exam was performed according to the departmental dose-optimization program which includes automated exposure control, adjustment of the mA and/or kV according to patient size and/or use of iterative reconstruction technique. CONTRAST:  OMNIPAQUE  IOHEXOL  300 MG/ML  SOLN COMPARISON:  CT abdomen pelvis 11/18/2022 FINDINGS: Lower chest: No acute abnormality. Hepatobiliary: No focal hepatic lesion. Fluid and gas containing structure in the porta hepatis may be a remnant gallbladder stump as this communicates with the biliary tree. This is unchanged from 11/18/2022. Per chart review the patient is status post cholecystectomy. Pneumobilia. Unchanged prominence of the common bile duct measuring 10 mm in diameter. Pancreas: Unremarkable. Spleen: Unremarkable. Adrenals/Urinary Tract: Normal adrenal glands. Punctate nonobstructing stones bilaterally. No hydronephrosis. Urothelial thickening about the mid left ureter is likely reactive secondary to adjacent diverticulitis. Gas is present within the bladder. Bladder wall thickening in the dome of the bladder. Stomach/Bowel: Stomach is within normal limits. Duodenal diverticulum. No bowel obstruction. Normal appendix. Extensive sigmoid and descending colon diverticulosis. There is marked wall thickening with adjacent inflammatory fat stranding and pericolonic fluid about the sigmoid colon. Peripherally enhancing irregular gas and fluid collection between the inflamed portions of the sigmoid colon in the dome of the bladder measuring 4.4 x 3.3 x 2.4 cm (series 2/image 69 and 7/69). Fluid-filled tracts extend between the sigmoid colon in the bladder. Along  with the gas in the bladder this is highly suspicious for colovesical fistula with possible additional colocolic fistula. The gas and fluid collection extends posteriorly to the vaginal  cuff (series 9/image 98). Small locule of gas in the vagina. Colovaginal fistula is difficult to exclude. Vascular/Lymphatic: Aortic atherosclerosis. No enlarged abdominal or pelvic lymph nodes. Reproductive: Hysterectomy. See above for discussion about possible colo vaginal fistula. No adnexal mass. Other: Ventral abdominal wall hernia repair. Musculoskeletal: No acute fracture. IMPRESSION: Complicated sigmoid diverticulitis with 4.4 cm gas containing abscess between the sigmoid colon and bladder. 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 Soft tissue tracts extend between portions of the sigmoid colon as well as to the vaginal cuff suspicious for additional colocolic and colovaginal fistulas. Aortic Atherosclerosis (ICD10-I70.0). Electronically Signed   By: Rozell Cornet M.D.   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 Professional time spent includes the following activities: Preparing to see the patient (review of tests), Obtaining and or reviewing separately obtained history (discharge record 5/14, IR notes and prior notes from Dr Zelda Hickman), Performing a medically appropriate examination and evaluation , discussing labs, Documenting clinical information in the EMR, Independently interpreting results (not separately reported), Communicating results to the patient/family member, Counseling and educating the patient/family/member and Care coordination (not separately reported).   Of note, portions of this note may have been created with voice recognition software. While this note has been edited for accuracy, occasional wrong-word or 'sound-a-like' substitutions may have occurred due to the inherent limitations of voice recognition software.   Melvina Stage, MD [facility] 12/08/2023, 10:50 AM

## 2023-12-08 NOTE — Telephone Encounter (Addendum)
 Per Dr. Gillian Lacrosse extend IV abx till patient is seen on 6/18. Message was sent to [facility] as well and spoke with Kay Parson, RN at [facility].     [facility] home health [facility]  Ph: [phone] Fax: [phone]

## 2023-12-09 ENCOUNTER — Telehealth: Payer: Self-pay

## 2023-12-09 MED ORDER — METRONIDAZOLE 500 MG PO TABS: 500.0000 mg | ORAL_TABLET | Freq: Two times a day (BID) | ORAL | 0 refills | Status: AC

## 2023-12-09 NOTE — Telephone Encounter (Signed)
-----   Message from Melvina Stage sent at 12/09/2023 10:31 AM EDT ----- Regarding: Metronidazole  refill Team,   Could you please send her metronidazole  500 mg po bid for 30 day supply to her preferred pharmacy? It appears she does not have adequate supply to last for next appt.

## 2023-12-09 NOTE — Telephone Encounter (Signed)
 Updated patient that refill for Metro has been called into pharmacy, no questions. Would like to inform MD that her surgery has been postponed 4 weeks. Julien Odor, RMA

## 2023-12-11 ENCOUNTER — Other Ambulatory Visit: Payer: Self-pay | Admitting: Urology

## 2023-12-14 ENCOUNTER — Ambulatory Visit: Payer: Self-pay | Admitting: General Surgery

## 2023-12-14 DIAGNOSIS — K5732 Diverticulitis of large intestine without perforation or abscess without bleeding: Secondary | ICD-10-CM

## 2023-12-22 ENCOUNTER — Telehealth: Payer: Self-pay

## 2023-12-22 NOTE — Telephone Encounter (Signed)
 Called [facility] [phone]) to request patient's labs be faxed to triage prior to her appointment tomorrow.   Left voicemail for Arvin Bishop, administrator, requesting labs be faxed to triage.   Avir Deruiter, BSN, RN

## 2023-12-23 ENCOUNTER — Ambulatory Visit (INDEPENDENT_AMBULATORY_CARE_PROVIDER_SITE_OTHER): Admitting: Internal Medicine

## 2023-12-23 ENCOUNTER — Encounter: Payer: Self-pay | Admitting: Internal Medicine

## 2023-12-23 ENCOUNTER — Other Ambulatory Visit: Payer: Self-pay

## 2023-12-23 VITALS — BP 176/74 | HR 55 | Temp 97.3°F

## 2023-12-23 DIAGNOSIS — B999 Unspecified infectious disease: Secondary | ICD-10-CM

## 2023-12-23 DIAGNOSIS — L988 Other specified disorders of the skin and subcutaneous tissue: Secondary | ICD-10-CM

## 2023-12-23 NOTE — Patient Instructions (Signed)
 Please stop your antibiotics after last dose today   Leave picc in for now   I'll see you in about 1-2 weeks for follow and plan to repeat close follow up another 1-2 weeks after that   If fever, chill, abdominal pain before 1-2weeks then message us 

## 2023-12-23 NOTE — Progress Notes (Signed)
 Per Dr. Shereen Dike, OPAT EOT is today 6/18. He would like to leave PICC line in place for now. Orders sent to Kay Parson, RN with [facility] to notify home health to maintain PICC line until further notice.   Also requested that Pam assist with obtaining patient's labs. Voicemail was left for her [facility] agency yesterday, but labs have not been received yet.   [facility] pharmacy team notified.   Fryda Molenda, BSN, RN

## 2023-12-23 NOTE — Progress Notes (Signed)
 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   Chronic gastritis 06/27/2022   Melena    GI bleed 05/25/2020   Leukocytosis 05/25/2020   Thrombocytosis 05/25/2020   Normocytic anemia 05/25/2020   GERD (gastroesophageal reflux disease) 05/25/2020   Dyslipidemia 05/25/2020   Vitamin D deficiency 05/25/2020   Choledocholithiasis 12/26/2019   RUQ pain 09/27/2019   Neck mass 09/27/2019   Elevated LFTs 05/02/2019   Fatty liver 05/02/2019   Rectal bleeding 05/02/2019   Peripheral arterial disease (HCC) 11/14/2013   Palpitations 05/20/2011   Tobacco abuse disorder 05/20/2011   Panic disorder without agoraphobia 07/13/2007    Patient's Medications  New Prescriptions   No medications on file  Previous Medications   ALENDRONATE (FOSAMAX) 70 MG TABLET    Take 70 mg by mouth every Monday. Take with a full glass of water  on an empty stomach.   AMIODARONE  (PACERONE ) 200 MG TABLET    Take 200 mg by mouth at bedtime.   AMLODIPINE  (NORVASC ) 5 MG TABLET    Take 5 mg by mouth 2 (two) times daily.   ATORVASTATIN  (LIPITOR) 80 MG TABLET    Take 80 mg by mouth at bedtime.   CALCIUM -VITAMIN D (OSCAL WITH D) 500-5 MG-MCG TABLET    Take 1 tablet by mouth.   CYANOCOBALAMIN (VITAMIN B 12 PO)    Place 3,000 mcg under the tongue 3 (three) times a week.   CYANOCOBALAMIN (VITAMIN B-12 IJ)    Inject 1,000 mcg into the muscle once a week. Wednesday   DEXLANSOPRAZOLE  (DEXILANT ) 30 MG CAPSULE DR    Take 30 mg by mouth daily as needed (acid reflux).   ERGOCALCIFEROL (VITAMIN D2) 1.25 MG (50000 UT) CAPSULE    Take 50,000 Units by mouth once a week. wednesday    MAGNESIUM  CITRATE (MAGNESIUM  GUMMIES PO)    Take 2 tablets by mouth daily.   METOPROLOL  SUCCINATE (TOPROL -XL) 50 MG 24 HR TABLET    Take 50 mg by mouth 2 (two) times daily.   METRONIDAZOLE  (FLAGYL ) 500 MG TABLET    Take 1 tablet (500 mg total) by mouth 2 (two) times daily.   PREDNISONE  (DELTASONE ) 5 MG TABLET    Take 5 mg by mouth daily.   SENNA-TIME 8.6 MG TABLET    Take 2 tablets by mouth as needed for constipation.   SODIUM CHLORIDE  FLUSH (NS) 0.9 % SOLN    10 mLs by Intracatheter route as needed (flush).   SULFASALAZINE  (AZULFIDINE ) 500 MG TABLET    Take 500 mg by mouth daily.   XARELTO  20 MG TABS TABLET    Take 20 mg by mouth daily.  Modified Medications   No medications on file  Discontinued Medications   No medications on file    Subjective Discussed the use of AI scribe software for clinical note transcription with the patient, who gave verbal consent to proceed.   68 Y O Female with prior h/o RA ( on prednisone  and sulfasalazine , declines having Psoriasis), PAD, dyslipidemia, GERD, A fib, h/o ERCP and biliary stent placement s/p removal and h/o diverticulitis with transverse colon perforation/peritonitis requiring ostomy with subsequent takedown with multiple recurrence last year, recent admission at [facility] (  discharged on 4/14 and was taking Cipro  for UTI) ago is here for HFU after recent admission 5/6-5/14 for recurrent diverticulitis/abscess. N o surgical intervention per surgery. 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  S/p PERC drain placement on 5/7. Initially on IV zosyn  which was later switched to IV ceftriaxone  and po metronidazole   once cx finalized as E coli due to no feasible PO options  and plan for 4 weeks through 6/4 via PICC.   6/3 Accompanied by family member. Reports getting IV ceftriaxone  via PICC and PO metronidazole  without any concerns. Denies fevers, chills, nausea, vomiting, abdominal pain or abnormal Bms. She reports being  tentatively planned for surgery on June 16th. She had a repeat CT done on 5/21 and prior drain removed.   She reports stool passing through her vagina, with more stool exiting vaginally than rectally. She is concerned about the risk of a urinary tract infection due to stool passing near her bladder. labs from [facility] requested   12/23/23 id clinic f/u Doing well Stool passing in vaginal area every day 4-5 times a day Last day of planned abx Surgical f/u on 7/8 See a&p  Review of Systems: all systems reviewed with pertinent positives and negatives as listed above   Past Medical History:  Diagnosis Date   Diverticulitis    Dyslipidemia    PAD (peripheral artery disease) (HCC)    PONV (postoperative nausea and vomiting)    Psoriasis    RA (rheumatoid arthritis) (HCC)    Vertigo    Past Surgical History:  Procedure Laterality Date   ANEURYSM COILING Right    right toe    BILIARY BRUSHING  05/16/2020   Procedure: BILIARY BRUSHING;  Surgeon: Ruby Corporal, MD;  [facility]: [facility];  Service: Endoscopy;;   BILIARY BRUSHING  07/26/2020   Procedure: BILIARY BRUSHING;  Surgeon: Janel Medford, MD;  [facility];  Service: Endoscopy;;   BILIARY STENT PLACEMENT  01/05/2020   Procedure: BILIARY STENT PLACEMENT;  Surgeon: Ruby Corporal, MD;  [facility];  Service: Endoscopy;;   BILIARY STENT PLACEMENT  05/16/2020   Procedure: BILIARY STENT PLACEMENT;  Surgeon: Ruby Corporal, MD;  Location: [facility];  Service: Endoscopy;;   CHOLECYSTECTOMY     COLONOSCOPY  03/07/2015   Propofol ; Surgeon: Dr. Siegfried Dress; Three 6 mm rectosigmoid polyps, moderate left-sided diverticulosis, tortuous rectosigmoid colon. Pathology with inflamed hyperplastic polyp.   COLONOSCOPY WITH PROPOFOL  N/A 08/18/2019   non-bleeding internal hemorrhoids, pancolonic diverticulosis, two 4-7 mm polyps in rectum benign, no adenomas. Next colonoscopy in 10 years.    ENDOSCOPIC RETROGRADE CHOLANGIOPANCREATOGRAPHY  (ERCP) WITH PROPOFOL  N/A 07/26/2020   Procedure: ENDOSCOPIC RETROGRADE CHOLANGIOPANCREATOGRAPHY (ERCP) WITH PROPOFOL ;  Surgeon: Janel Medford, MD;  [facility];  Service: Endoscopy;  Laterality: N/A;   ERCP N/A 01/05/2020   Procedure: ENDOSCOPIC RETROGRADE CHOLANGIOPANCREATOGRAPHY (ERCP);  Surgeon: Ruby Corporal, MD;  [facility];  Service: Endoscopy;  Laterality: N/A;   ERCP N/A 05/16/2020   Procedure: ENDOSCOPIC RETROGRADE CHOLANGIOPANCREATOGRAPHY (ERCP);  Surgeon: Ruby Corporal, MD;  [facility];  Service: Endoscopy;  Laterality: N/A;   ESOPHAGOGASTRODUODENOSCOPY (EGD) WITH PROPOFOL  N/A 07/26/2020   Procedure: ESOPHAGOGASTRODUODENOSCOPY (EGD) WITH PROPOFOL ;  Surgeon: Janel Medford, MD;  [facility];  Service: Endoscopy;  Laterality: N/A;   GASTROINTESTINAL STENT REMOVAL N/A 05/16/2020   Procedure: STENT REMOVAL;  Surgeon: Ruby Corporal, MD;  [facility]: [facility];  Service: Endoscopy;  Laterality: N/A;  HYSTERECTOMY ABDOMINAL WITH SALPINGO-OOPHORECTOMY     ILEOSTOMY  2005   per patient for bowel obstruction and peritonitis with anastomosis   INSERTION OF MESH     RLQ   IR RADIOLOGIST EVAL & MGMT  11/25/2023   POLYPECTOMY  08/18/2019   Procedure: POLYPECTOMY;  Surgeon: Suzette Espy, MD;  [facility];  Service: Endoscopy;;   REMOVAL OF STONES N/A 01/05/2020   Procedure: REMOVAL OF STONES;  Surgeon: Ruby Corporal, MD;  [facility];  Service: Endoscopy;  Laterality: N/A;   SPHINCTEROTOMY N/A 01/05/2020   Procedure: SPHINCTEROTOMY;  Surgeon: Ruby Corporal, MD;  [facility];  Service: Endoscopy;  Laterality: N/A;   SPHINCTEROTOMY N/A 05/16/2020   Procedure: SPHINCTEROTOMY;  Surgeon: Ruby Corporal, MD;  [facility];  Service: Endoscopy;  Laterality: N/A;   SPYGLASS CHOLANGIOSCOPY N/A 07/26/2020   Procedure: GNFAOZHY CHOLANGIOSCOPY;  Surgeon: Janel Medford, MD;  [facility];  Service:  Endoscopy;  Laterality: N/A;   STENT REMOVAL  07/26/2020   Procedure: STENT REMOVAL;  Surgeon: Janel Medford, MD;  [facility];  Service: Endoscopy;;   UPPER ESOPHAGEAL ENDOSCOPIC ULTRASOUND (EUS) N/A 07/26/2020   Procedure: UPPER ESOPHAGEAL ENDOSCOPIC ULTRASOUND (EUS);  Surgeon: Janel Medford, MD;  Location: [facility];  Service: Endoscopy;  Laterality: N/A;    Social History   Tobacco Use   Smoking status: Former    Current packs/day: 0.00    Average packs/day: 0.5 packs/day for 50.0 years (25.0 ttl pk-yrs)    Types: Cigarettes    Start date: 08/14/1968    Quit date: 08/14/2018    Years since quitting: 5.3   Smokeless tobacco: Never  Vaping Use   Vaping status: Never Used  Substance Use Topics   Alcohol use: Yes    Comment: not much per pt as of 07/03/23   Drug use: Never    Family History  Problem Relation Age of Onset   Lung cancer Mother    Lung cancer Father    Stomach cancer Brother    Breast cancer Cousin    Colon cancer Neg Hx     Allergies  Allergen Reactions   Codeine     Headache-draws line across forehead    Health Maintenance  Topic Date Due   Medicare Annual Wellness (AWV)  Never done   DTaP/Tdap/Td (1 - Tdap) Never done   Lung Cancer Screening  Never done   MAMMOGRAM  Never done   Pneumococcal Vaccine: 50+ Years (2 of 2 - PCV) 05/18/2018   DEXA SCAN  Never done   COVID-19 Vaccine (3 - Moderna risk series) 11/04/2019   Zoster Vaccines- Shingrix (2 of 2) 06/11/2022   INFLUENZA VACCINE  02/05/2024   Colonoscopy  08/17/2029   Hepatitis C Screening  Completed   HPV VACCINES  Aged Out   Meningococcal B Vaccine  Aged Out    Objective:  Vitals:   12/23/23 1019  BP: (!) 176/74  Pulse: (!) 55  Temp: (!) 97.3 F (36.3 C)  TempSrc: Temporal  SpO2: 97%    There is no height or weight on file to calculate BMI.  Physical Exam Constitutional:      Appearance: Normal appearance.  HENT:     Head: Normocephalic and atraumatic.       Mouth: Mucous membranes are moist.  Eyes:    Conjunctiva/sclera: Conjunctivae normal.     Pupils: Pupils are equal, round, and b/l symmetrical    Cardiovascular:  Rate and Rhythm: Normal rate and Irregular rhythm.     Heart sounds: s1s2  Pulmonary:     Effort: Pulmonary effort is normal.     Breath sounds: Normal breath sounds.   Abdominal:     General: non distended     Palpations: soft, non tender   Musculoskeletal:        General: Normal range of motion.   Skin:    General: Skin is warm and dry.     Comments: RT arm PICC with no concerns   Neurological:     General: grossly non focal     Mental Status: awake, alert and oriented to person, place, and time.   Psychiatric:        Mood and Affect: Mood normal.   Lab Results Lab Results  Component Value Date   WBC 16.0 (H) 11/18/2023   HGB 12.2 11/18/2023   HCT 40.0 11/18/2023   MCV 96.4 11/18/2023   PLT 674 (H) 11/18/2023    Lab Results  Component Value Date   CREATININE 0.68 11/18/2023   BUN 8 11/18/2023   NA 136 11/18/2023   K 3.7 11/18/2023   CL 106 11/18/2023   CO2 19 (L) 11/18/2023    Lab Results  Component Value Date   ALT 17 11/12/2023   AST 18 11/12/2023   ALKPHOS 52 11/12/2023   BILITOT 0.7 11/12/2023    No results found for: CHOL, HDL, LDLCALC, LDLDIRECT, TRIG, CHOLHDL Lab Results  Component Value Date   LABRPR NON REACTIVE 11/18/2022   No results found for: HIV1RNAQUANT, HIV1RNAVL, CD4TABS   Microbiology  Results for orders placed or performed during the hospital encounter of 11/10/23  Body fluid culture w Gram Stain     Status: None   Collection Time: 11/12/23 12:12 PM   Specimen: Pelvis; Body Fluid  Result Value Ref Range Status   Specimen Description   Final    PELVIS Performed at [facility], [address] [place], [place] [address]    Special Requests   Final    Normal Performed at [facility], [address] [place], [place] [address]    Gram Stain   Final    ABUNDANT WBC PRESENT, PREDOMINANTLY PMN NO ORGANISMS SEEN Performed at [facility], [address], [place], [place] [address]    Culture RARE ESCHERICHIA COLI  Final   Report Status 11/18/2023 FINAL  Final   Organism ID, Bacteria ESCHERICHIA COLI  Final   Organism ID, Bacteria ESCHERICHIA COLI  Final      Susceptibility   Escherichia coli - MIC*    AMPICILLIN >=32 RESISTANT Resistant     CEFEPIME <=0.12 SENSITIVE Sensitive     CEFTAZIDIME <=1 SENSITIVE Sensitive     CEFTRIAXONE  <=0.25 SENSITIVE Sensitive     CIPROFLOXACIN  >=4 RESISTANT Resistant     GENTAMICIN <=1 SENSITIVE Sensitive     IMIPENEM <=0.25 SENSITIVE Sensitive     TRIMETH/SULFA >=320 RESISTANT Resistant     AMPICILLIN/SULBACTAM >=32 RESISTANT Resistant     PIP/TAZO <=4 SENSITIVE Sensitive ug/mL   Escherichia coli - KIRBY BAUER*    CEFAZOLIN  INTERMEDIATE Intermediate     * RARE ESCHERICHIA COLI    RARE ESCHERICHIA COLI  Urine Culture     Status: Abnormal   Collection Time: 11/17/23 12:04 PM   Specimen: Urine, Random  Result Value Ref Range Status   Specimen Description   Final    URINE, RANDOM Performed at [facility], [address] [place], [place] [address]    Special Requests   Final    NONE Reflexed from [id number] Performed at [facility], [address], [place], [place] [address]    Culture MULTIPLE SPECIES PRESENT, SUGGEST RECOLLECTION (A)  Final   Report Status 11/18/2023 FINAL  Final   Imaging  CT ABDOMEN PELVIS W CONTRAST Result Date: 11/25/2023 CLINICAL DATA:  68 year old female with a history diverticulitis, prior abscess drainage EXAM: CT ABDOMEN AND PELVIS WITH CONTRAST TECHNIQUE: Multidetector CT imaging of the abdomen and pelvis was performed using the standard protocol following bolus administration of intravenous contrast. RADIATION DOSE REDUCTION: This exam was performed according to the  departmental dose-optimization program which includes automated exposure control, adjustment of the mA and/or kV according to patient size and/or use of iterative reconstruction technique. CONTRAST:  ISOVUE -300 IOPAMIDOL  (ISOVUE -300) INJECTION 61% COMPARISON:  11/15/2023 FINDINGS: Lower chest: No acute finding Hepatobiliary: Liver parenchyma unremarkable. There is some persisting gas within the gallbladder in anti dependent biliary system, left lobe, representing prior sphincterotomy. Duodenal diverticulum. Pancreas: Unremarkable Spleen: Unremarkable Adrenals/Urinary Tract: - Right adrenal gland:  Unremarkable - Left adrenal gland: Unremarkable. - Right kidney: No hydronephrosis, nephrolithiasis, inflammation, or ureteral dilation. There are several small hypoenhancing lesions which are too small to characterize. - Left Kidney: No hydronephrosis, nephrolithiasis, inflammation, or ureteral dilation. There are several small hypoenhancing lesions which are too small to characterize. - Urinary Bladder: Urinary bladder again demonstrates air-fluid level, partially distended. The left aspect of the urinary bladder is inseparable from the inflammatory process involving:. The small persisting abscess, from which the drain is been withdrawn, is immediately adjacent to the urinary bladder. On the coronal images there is suspicion of a direct communication of the abscess cavity with the urinary bladder. Stomach/Bowel: - Stomach: Unremarkable. - Small bowel: Unremarkable - Appendix: Normal. - Colon: Unremarkable proximal colon, with formed stool. No evidence of obstruction. Diverticular change of the descending colon. There is persisting inflammatory change surrounding the sigmoid colon with extensive diverticular disease. The previous drain has been withdrawn into the abdominal wall musculature. Small residual abscess cavity on the left aspect of the urinary bladder, caudal to the sigmoid colon. This measures  approximately 2.7 cm x 1.8 cm. Stranding within the fat. Vascular/Lymphatic: Atherosclerotic changes of the abdominal aorta. Mesenteric arteries are patent. Renal arteries are patent. Iliac and proximal femoral arteries patent. No mesenteric adenopathy. No inguinal adenopathy. Small nodes in the periaortic nodal station likely reactive. Reproductive: Surgical changes of hysterectomy. The cuff of prior hysterectomy site is contiguous with the residual abscess via the conglomeration of inflammatory changes and is the likely site of fistulous connection evident by clinical status. Other: Surgical changes of ventral hernia repair. The previous pigtail catheter has been withdrawn into the abdominal wall musculature. Musculoskeletal: No displaced fracture. Degenerative changes of the spine. IMPRESSION: The previous pigtail drainage catheter has been withdrawn into the abdominal wall musculature, displaced from the pelvic abscess. Persisting extensive inflammatory changes adjacent to the sigmoid colon, contiguous with the small abscess to the left of the urinary bladder, and inseparable from both the left aspect of the urinary bladder as well as the surgical cuff of prior hysterectomy site. These 2 locations most likely represent the site of fistulous connection to both urinary bladder and vaginal cuff as is evident clinically. Small abscess persists near the presumed sites of fistulous connection, from where the percutaneous drain has been withdrawn. Greatest dimension 2.7 cm. Aortic atherosclerosis.  Aortic Atherosclerosis (ICD10-I70.0).  Additional ancillary findings as above. Signed, Marciano Settles. Rexine Cater, RPVI [facility] Electronically Signed   By: Myrlene Asper D.O.   On: 11/25/2023 12:13   IR Radiologist Eval & Mgmt Result Date: 11/25/2023 EXAM: NEW PATIENT OFFICE VISIT CHIEF COMPLAINT: Electronic medical record HISTORY OF PRESENT ILLNESS: Electronic  medical record REVIEW OF SYSTEMS: Electronic medical record PHYSICAL EXAMINATION: Electronic medical record ASSESSMENT AND PLAN: Electronic medical record Electronically Signed   By: Myrlene Asper D.O.   On: 11/25/2023 11:46    Assessment/Plan # Complicated sigmoid diverticulitis with abscess, with multiple recurrences and prior h/o perforation and ostomy  - No intervention per surgery  - 5/7 CT guided JP drain placement. 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 Stool through vaginal orifice has been present since hospitalization.  Patient has been on ceftriaxone /flagyl . Last seen my partner Dr Gillian Lacrosse 2 weeks prior Drain placed previously and removed 5/21 E coli on cx, without oral abx option  No nausea/vomiting/rash Appetite intact No fever, chill No abd pain or tenderness   Last day of antibiotics planned today  I think it's been long enough and with recent imaging no new abscess I would watch closely to see how she does  Follow up 1-2 weeks  Will leave picc in for now  Surgery planning to see 01/12/2024 -- [place], [place]. I discuss stopping abx and observe with Dr  Dorrie Gaudier.        Jamesetta Mcbride, MD [facility] 12/23/2023, 10:09 AM

## 2023-12-30 ENCOUNTER — Telehealth: Payer: Self-pay

## 2023-12-30 NOTE — Telephone Encounter (Signed)
 Received call from Sam with [facility] stating that patient reached out requesting PICC dressing change. She states they do not have those orders on file and that patient was discharged.   Notified Sam that orders were sent to Holley Herring with [facility] to leave PICC in place and maintain the line. Relayed that Shenelle has an appointment with Dr. Overton tomorrow, at which point further decision regarding PICC line will be made.   Tashaun Obey, BSN, RN

## 2023-12-31 ENCOUNTER — Telehealth: Payer: Self-pay

## 2023-12-31 ENCOUNTER — Other Ambulatory Visit: Payer: Self-pay

## 2023-12-31 ENCOUNTER — Ambulatory Visit: Admitting: Internal Medicine

## 2023-12-31 ENCOUNTER — Encounter: Payer: Self-pay | Admitting: Internal Medicine

## 2023-12-31 VITALS — BP 153/73 | HR 87 | Resp 16 | Ht 63.0 in | Wt 151.0 lb

## 2023-12-31 DIAGNOSIS — B999 Unspecified infectious disease: Secondary | ICD-10-CM

## 2023-12-31 NOTE — Telephone Encounter (Signed)
 Per Constance Passer MD: Patient has finished IV Abx and is now on oral abx. Will keep PICC line in place for procedure on 01/12/24. Patient to be admitted and hospital will remove PICC line inpatient after procedure.  Orders: HH to continue PICC dressing changes HH to continue providing Heparin /Saline Flush materials as patient is flushing every 12 hours.  These orders will expire upon admission to hospital 01/12/24.  Attached [facility] Triage, [facility] Team and [facility] Pharmacy Team.

## 2023-12-31 NOTE — Progress Notes (Signed)
 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   Chronic gastritis 06/27/2022   Melena    GI bleed 05/25/2020   Leukocytosis 05/25/2020   Thrombocytosis 05/25/2020   Normocytic anemia 05/25/2020   GERD (gastroesophageal reflux disease) 05/25/2020   Dyslipidemia 05/25/2020   Vitamin D deficiency 05/25/2020   Choledocholithiasis 12/26/2019   RUQ pain 09/27/2019   Neck mass 09/27/2019   Elevated LFTs 05/02/2019   Fatty liver 05/02/2019   Rectal bleeding 05/02/2019   Peripheral arterial disease (HCC) 11/14/2013   Palpitations 05/20/2011   Tobacco abuse disorder 05/20/2011   Panic disorder without agoraphobia 07/13/2007    Patient's Medications  New Prescriptions   No medications on file  Previous Medications   ACIDOPHILUS (RISAQUAD) CAPS CAPSULE    Take 1 capsule by mouth daily.   AMIODARONE  (PACERONE ) 200 MG TABLET    Take 200 mg by mouth at bedtime.   AMLODIPINE  (NORVASC ) 5 MG TABLET    Take 5 mg by mouth daily.   ATORVASTATIN  (LIPITOR) 80 MG TABLET    Take 80 mg by mouth at bedtime.   CALCIUM -VITAMIN D (OSCAL WITH D) 500-5 MG-MCG TABLET    Take 1 tablet by mouth 2 (two) times daily.   CETIRIZINE (ZYRTEC) 10 MG TABLET    Take 10 mg by mouth daily.   CHOLECALCIFEROL (VITAMIN D3) 1.25 MG (50000 UT) CAPS    Take 50,000 Units by mouth every Wednesday.   CINNAMON PO    Take 1 tablet by mouth daily.   CYANOCOBALAMIN (VITAMIN B 12 PO)    Place 3,000 mcg under the tongue daily.   CYANOCOBALAMIN (VITAMIN B-12 IJ)    Inject 1,000 mcg into the muscle once a week. Wednesday   DIPHENHYDRAMINE (BENADRYL) 50 MG CAPSULE    Take 50 mg by  mouth at bedtime.   ERGOCALCIFEROL (VITAMIN D2) 1.25 MG (50000 UT) CAPSULE    Take 50,000 Units by mouth every Wednesday.   FLUTICASONE  (FLONASE ) 50 MCG/ACT NASAL SPRAY    Place 1 spray into both nostrils daily as needed for allergies or rhinitis.   GLUCOSAMINE-CHONDROITIN 500-400 MG TABLET    Take 1 tablet by mouth daily.   LATANOPROST (XALATAN) 0.005 % OPHTHALMIC SOLUTION    Place 1 drop into both eyes at bedtime.   MAGNESIUM  CITRATE (MAGNESIUM  GUMMIES PO)    Take 2 tablets by mouth daily.   METOPROLOL  SUCCINATE (TOPROL -XL) 50 MG 24 HR TABLET    Take 50 mg by mouth 2 (two) times daily.   METRONIDAZOLE  (FLAGYL ) 500 MG TABLET    Take 1 tablet (500 mg total) by mouth 2 (two) times daily.   PREDNISONE  (DELTASONE ) 5 MG TABLET    Take 5 mg by mouth daily.   SODIUM CHLORIDE  FLUSH (NS) 0.9 % SOLN    10 mLs by Intracatheter route as needed (flush).   XARELTO  20 MG TABS TABLET    Take 20 mg by mouth daily.  Modified Medications   No medications on file  Discontinued Medications   No medications on file    Subjective Discussed the use of AI scribe software for clinical note transcription with the patient, who gave verbal consent to proceed.  68 Y O Female with prior h/o RA ( on prednisone  and sulfasalazine , declines having Psoriasis), PAD, dyslipidemia, GERD, A fib, h/o ERCP and biliary stent placement s/p removal and h/o diverticulitis with transverse colon perforation/peritonitis requiring ostomy with subsequent takedown with multiple recurrence last year, recent admission at [facility] ( discharged on 4/14 and was taking Cipro  for UTI) ago is here for HFU after recent admission 5/6-5/14 for recurrent diverticulitis/abscess. N o surgical intervention per surgery. 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  S/p PERC drain placement on 5/7. Initially on IV zosyn  which was later switched to IV ceftriaxone  and po metronidazole   once cx finalized as E coli due to no feasible  PO options  and plan for 4 weeks through 6/4 via PICC.   6/3 Accompanied by family member. Reports getting IV ceftriaxone  via PICC and PO metronidazole  without any concerns. Denies fevers, chills, nausea, vomiting, abdominal pain or abnormal Bms. She reports being tentatively planned for surgery on June 16th. She had a repeat CT done on 5/21 and prior drain removed.   She reports stool passing through her vagina, with more stool exiting vaginally than rectally. She is concerned about the risk of a urinary tract infection due to stool passing near her bladder. labs [facility] requested   12/23/23 id clinic f/u Doing well Stool passing in vaginal area every day 4-5 times a day Last day of planned abx Surgical f/u on 7/8 See a&p  Review of Systems: all systems reviewed with pertinent positives and negatives as listed above   Past Medical History:  Diagnosis Date   Diverticulitis    Dyslipidemia    PAD (peripheral artery disease) (HCC)    PONV (postoperative nausea and vomiting)    Psoriasis    RA (rheumatoid arthritis) (HCC)    Vertigo    Past Surgical History:  Procedure Laterality Date   ANEURYSM COILING Right    right toe    BILIARY BRUSHING  05/16/2020   Procedure: BILIARY BRUSHING;  Surgeon: Golda Claudis PENNER, MD;  Location: [facility];  Service: Endoscopy;;   BILIARY BRUSHING  07/26/2020   Procedure: BILIARY BRUSHING;  Surgeon: Teressa Toribio SQUIBB, MD;  Location: [facility];  Service: Endoscopy;;   BILIARY STENT PLACEMENT  01/05/2020   Procedure: BILIARY STENT PLACEMENT;  Surgeon: Golda Claudis PENNER, MD;  [facility];  Service: Endoscopy;;   BILIARY STENT PLACEMENT  05/16/2020   Procedure: BILIARY STENT PLACEMENT;  Surgeon: Golda Claudis PENNER, MD;  Location: [facility];  Service: Endoscopy;;   CHOLECYSTECTOMY     COLONOSCOPY  03/07/2015   Propofol ; Surgeon: Dr. Violet; Three 6 mm rectosigmoid polyps, moderate left-sided diverticulosis, tortuous rectosigmoid colon. Pathology with  inflamed hyperplastic polyp.   COLONOSCOPY WITH PROPOFOL  N/A 08/18/2019   non-bleeding internal hemorrhoids, pancolonic diverticulosis, two 4-7 mm polyps in rectum benign, no adenomas. Next colonoscopy in 10 years.    ENDOSCOPIC RETROGRADE CHOLANGIOPANCREATOGRAPHY (ERCP) WITH PROPOFOL  N/A 07/26/2020   Procedure: ENDOSCOPIC RETROGRADE CHOLANGIOPANCREATOGRAPHY (ERCP) WITH PROPOFOL ;  Surgeon: Teressa Toribio SQUIBB, MD;  [facility];  Service: Endoscopy;  Laterality: N/A;   ERCP N/A 01/05/2020   Procedure: ENDOSCOPIC RETROGRADE CHOLANGIOPANCREATOGRAPHY (ERCP);  Surgeon: Golda Claudis PENNER, MD;  [facility];  Service: Endoscopy;  Laterality: N/A;   ERCP N/A 05/16/2020   Procedure: ENDOSCOPIC RETROGRADE CHOLANGIOPANCREATOGRAPHY (ERCP);  Surgeon: Golda Claudis PENNER, MD;  Location: [facility];  Service: Endoscopy;  Laterality: N/A;   ESOPHAGOGASTRODUODENOSCOPY (EGD) WITH PROPOFOL  N/A 07/26/2020  Procedure: ESOPHAGOGASTRODUODENOSCOPY (EGD) WITH PROPOFOL ;  Surgeon: Teressa Toribio SQUIBB, MD;  [facility]: [facility];  Service: Endoscopy;  Laterality: N/A;   GASTROINTESTINAL STENT REMOVAL N/A 05/16/2020   Procedure: STENT REMOVAL;  Surgeon: Golda Claudis PENNER, MD;  Location: [facility];  Service: Endoscopy;  Laterality: N/A;   HYSTERECTOMY ABDOMINAL WITH SALPINGO-OOPHORECTOMY     ILEOSTOMY  2005   per patient for bowel obstruction and peritonitis with anastomosis   INSERTION OF MESH     RLQ   IR RADIOLOGIST EVAL & MGMT  11/25/2023   POLYPECTOMY  08/18/2019   Procedure: POLYPECTOMY;  Surgeon: Shaaron Lamar HERO, MD;  [facility];  Service: Endoscopy;;   REMOVAL OF STONES N/A 01/05/2020   Procedure: REMOVAL OF STONES;  Surgeon: Golda Claudis PENNER, MD;  [facility];  Service: Endoscopy;  Laterality: N/A;   SPHINCTEROTOMY N/A 01/05/2020   Procedure: SPHINCTEROTOMY;  Surgeon: Golda Claudis PENNER, MD;  [facility];  Service: Endoscopy;  Laterality: N/A;   SPHINCTEROTOMY N/A 05/16/2020    Procedure: SPHINCTEROTOMY;  Surgeon: Golda Claudis PENNER, MD;  [facility]: [facility];  Service: Endoscopy;  Laterality: N/A;   SPYGLASS CHOLANGIOSCOPY N/A 07/26/2020   Procedure: DEBHOJDD CHOLANGIOSCOPY;  Surgeon: Teressa Toribio SQUIBB, MD;  [facility];  Service: Endoscopy;  Laterality: N/A;   STENT REMOVAL  07/26/2020   Procedure: STENT REMOVAL;  Surgeon: Teressa Toribio SQUIBB, MD;  [facility];  Service: Endoscopy;;   UPPER ESOPHAGEAL ENDOSCOPIC ULTRASOUND (EUS) N/A 07/26/2020   Procedure: UPPER ESOPHAGEAL ENDOSCOPIC ULTRASOUND (EUS);  Surgeon: Teressa Toribio SQUIBB, MD;  Location: [facility];  Service: Endoscopy;  Laterality: N/A;    Social History   Tobacco Use   Smoking status: Former    Current packs/day: 0.00    Average packs/day: 0.5 packs/day for 50.0 years (25.0 ttl pk-yrs)    Types: Cigarettes    Start date: 08/14/1968    Quit date: 08/14/2018    Years since quitting: 5.3   Smokeless tobacco: Never  Vaping Use   Vaping status: Never Used  Substance Use Topics   Alcohol use: Yes    Comment: not much per pt as of 07/03/23   Drug use: Never    Family History  Problem Relation Age of Onset   Lung cancer Mother    Lung cancer Father    Stomach cancer Brother    Breast cancer Cousin    Colon cancer Neg Hx     Allergies  Allergen Reactions   Codeine     Headache-draws line across forehead    Health Maintenance  Topic Date Due   Medicare Annual Wellness (AWV)  Never done   DTaP/Tdap/Td (1 - Tdap) Never done   Lung Cancer Screening  Never done   MAMMOGRAM  Never done   Pneumococcal Vaccine: 50+ Years (2 of 2 - PCV) 05/18/2018   DEXA SCAN  Never done   COVID-19 Vaccine (3 - Moderna risk series) 11/04/2019   Zoster Vaccines- Shingrix (2 of 2) 06/11/2022   INFLUENZA VACCINE  02/05/2024   Colonoscopy  08/17/2029   Hepatitis C Screening  Completed   Hepatitis B Vaccines  Aged Out   HPV VACCINES  Aged Out   Meningococcal B Vaccine  Aged Out     Objective:  Vitals:   12/31/23 0936  BP: (!) 153/73  Pulse: 87  Resp: 16  Weight: 151 lb (68.5 kg)  Height: 5' 3 (1.6 m)    Body mass index is 26.75 kg/m.  Physical  Exam Constitutional:      Appearance: Normal appearance.  HENT:     Head: Normocephalic and atraumatic.      Mouth: Mucous membranes are moist.  Eyes:    Conjunctiva/sclera: Conjunctivae normal.     Pupils: Pupils are equal, round, and b/l symmetrical    Cardiovascular:     Rate and Rhythm: Normal rate and Irregular rhythm.     Heart sounds: s1s2  Pulmonary:     Effort: Pulmonary effort is normal.     Breath sounds: Normal breath sounds.   Abdominal:     General: non distended     Palpations: soft, non tender   Musculoskeletal:        General: Normal range of motion.   Skin:    General: Skin is warm and dry.     Comments: RT arm PICC with no concerns   Neurological:     General: grossly non focal     Mental Status: awake, alert and oriented to person, place, and time.   Psychiatric:        Mood and Affect: Mood normal.   Lab Results Lab Results  Component Value Date   WBC 16.0 (H) 11/18/2023   HGB 12.2 11/18/2023   HCT 40.0 11/18/2023   MCV 96.4 11/18/2023   PLT 674 (H) 11/18/2023    Lab Results  Component Value Date   CREATININE 0.68 11/18/2023   BUN 8 11/18/2023   NA 136 11/18/2023   K 3.7 11/18/2023   CL 106 11/18/2023   CO2 19 (L) 11/18/2023    Lab Results  Component Value Date   ALT 17 11/12/2023   AST 18 11/12/2023   ALKPHOS 52 11/12/2023   BILITOT 0.7 11/12/2023    No results found for: CHOL, HDL, LDLCALC, LDLDIRECT, TRIG, CHOLHDL Lab Results  Component Value Date   LABRPR NON REACTIVE 11/18/2022   No results found for: HIV1RNAQUANT, HIV1RNAVL, CD4TABS   Microbiology  Results for orders placed or performed during the hospital encounter of 11/10/23  Body fluid culture w Gram Stain     Status: None   Collection Time: 11/12/23 12:12 PM    Specimen: Pelvis; Body Fluid  Result Value Ref Range Status   Specimen Description   Final    PELVIS Performed at [facility], [address] [place], [place] [address]    Special Requests   Final    Normal Performed at [facility], [address] [place], [place] [address]    Gram Stain   Final    ABUNDANT WBC PRESENT, PREDOMINANTLY PMN NO ORGANISMS SEEN Performed at [facility], [address], [place], [place] [address]    Culture RARE ESCHERICHIA COLI  Final   Report Status 11/18/2023 FINAL  Final   Organism ID, Bacteria ESCHERICHIA COLI  Final   Organism ID, Bacteria ESCHERICHIA COLI  Final      Susceptibility   Escherichia coli - MIC*    AMPICILLIN >=32 RESISTANT Resistant     CEFEPIME <=0.12 SENSITIVE Sensitive     CEFTAZIDIME <=1 SENSITIVE Sensitive     CEFTRIAXONE  <=0.25 SENSITIVE Sensitive     CIPROFLOXACIN  >=4 RESISTANT Resistant     GENTAMICIN <=1 SENSITIVE Sensitive     IMIPENEM <=0.25 SENSITIVE Sensitive     TRIMETH/SULFA >=320 RESISTANT Resistant     AMPICILLIN/SULBACTAM >=32 RESISTANT Resistant     PIP/TAZO <=4 SENSITIVE Sensitive ug/mL   Escherichia coli - KIRBY BAUER*    CEFAZOLIN  INTERMEDIATE Intermediate     *  RARE ESCHERICHIA COLI    RARE ESCHERICHIA COLI  Urine Culture     Status: Abnormal   Collection Time: 11/17/23 12:04 PM   Specimen: Urine, Random  Result Value Ref Range Status   Specimen Description   Final    URINE, RANDOM Performed at [facility], [address] [place], [place] [address]    Special Requests   Final    NONE Reflexed from [id number] Performed at [facility], [address] [place], [place] [address]    Culture MULTIPLE SPECIES PRESENT, SUGGEST RECOLLECTION (A)  Final   Report Status 11/18/2023 FINAL  Final   Imaging  No results found.   Assessment/Plan # Complicated sigmoid diverticulitis with abscess, with multiple recurrences and  prior h/o perforation and ostomy  - No intervention per surgery  - 5/7 CT guided JP drain placement. 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 Stool through vaginal orifice has been present since hospitalization.  Patient has been on ceftriaxone /flagyl . Last seen my partner Dr Dea 2 weeks prior Drain placed previously and removed 5/21 E coli on cx, without oral abx option  No nausea/vomiting/rash Appetite intact No fever, chill No abd pain or tenderness   Last day of antibiotics planned today  I think it's been long enough and with recent imaging no new abscess I would watch closely to see how she does  Follow up 1-2 weeks  Will leave picc in for now  Surgery planning to see 01/12/2024 -- Kinsinger, Herlene. I discuss stopping abx and observe with Dr Stevie.     ------------------ 12/31/23 id clinic assessment Feeling a little tired stable not worse since stopping antibiotics No fever, chill Appetite been low but stable Still planned surgery (colectomy/fistula repair 7/8)    -no antibiotics -cbc, cmp, crp, sed rate today -f/u preop tomorrow as already arranged -when  admitted 7/8 for surgery advise patient to ask for inpatient id team to review case/operative note and make sure no further abx needed -until 7/8 if no infection or abx needed then remove picc; patient did want to leave it in for now (she does saline/heparin  bid to kvo)      Constance ONEIDA Passer, MD [facility] 12/31/2023, 9:56 AM

## 2023-12-31 NOTE — Progress Notes (Addendum)
 COVID Vaccine received:  []  No [x]  Yes Date of any COVID positive Test in last 90 days:  PCP - Venetia Sprawls, PA-C [phone] Cardiologist - Brian Zagol MD at [facility] [phone] ID- Constance Passer, MD   (pt has PICC)  Chest x-ray -  EKG - 11-19-2022   will repeat  Stress Test -  ECHO -  Cardiac Cath -  CT Coronary Calcium  score:   Bowel Prep - []  No  [x]   Yes __CCS Bowel Prep____  Pacemaker / ICD device [x]  No []  Yes   Spinal Cord Stimulator:[x]  No []  Yes       History of Sleep Apnea? [x]  No []  Yes   CPAP used?- [x]  No []  Yes    Does the patient monitor blood sugar?   [x]  N/A   []  No []  Yes  Patient has: [x]  NO Hx DM   []  Pre-DM   []  DM1  []   DM2  Blood Thinner / Instructions: Xarelto   Hold x 2 days per Special needs in booking (Kinsinger) Aspirin Instructions:  none  ERAS Protocol Ordered: []  No  [x]  Yes PRE-SURGERY [x]  ENSURE  x3  []  G2  Patient is to be NPO after: 0430  Dental hx: []  Dentures:  [x]  N/A      []  Bridge or Partial:                   []  Loose or Damaged teeth:   Comments: currently has PICC line in her right upper arm, which will stay until after surgery   Stephane Fought, RN, ostomy nurse, came and marked the patient's abdomen.   Activity level: Able to walk up 2 flights of stairs without becoming significantly short of breath or having chest pain?  []  No   [x]    Yes   Anesthesia review: HTN, A.fib, ?LFTs, anemia, GERD, Panic attacks, RA, PAD, DOE, Thrombocytosis, Hx ileostomy 2005- had takedown done 20 years ago per patient.   Patient denies shortness of breath, fever, cough and chest pain at PAT appointment.  Patient verbalized understanding and agreement to the Pre-Surgical Instructions that were given to them at this PAT appointment. Patient was also educated of the need to review these PAT instructions again prior to her surgery.I reviewed the appropriate phone numbers to call if they have any and questions or concerns.

## 2023-12-31 NOTE — Patient Instructions (Addendum)
 We'll leave picc in   You shouldn't be on any antibiotics now   When you get admitted 7/8; if per surgical finding no sign of infection and clinically no sign of infection at that time, can remove picc inpatient. Please ask inpatient ID team to review case too and make sure   No need to follow up at this time unless you ended up needing prolonged antibiotics again

## 2023-12-31 NOTE — Patient Instructions (Addendum)
 SURGICAL WAITING ROOM VISITATION Patients having surgery or a procedure may have no more than 2 support people in the waiting area - these visitors may rotate in the visitor waiting room.   If the patient needs to stay at the hospital during part of their recovery, the visitor guidelines for inpatient rooms apply.  PRE-OP VISITATION  Pre-op nurse will coordinate an appropriate time for 1 support person to accompany the patient in pre-op.  This support person may not rotate.  This visitor will be contacted when the time is appropriate for the visitor to come back in the pre-op area.  Please refer to the [facility] website for the visitor guidelines for Inpatients (after your surgery is over and you are in a regular room).  You are not required to quarantine at this time prior to your surgery. However, you must do this: Hand Hygiene often Do NOT share personal items Notify your provider if you are in close contact with someone who has COVID or you develop fever 100.4 or greater, new onset of sneezing, cough, sore throat, shortness of breath or body aches.  If you test positive for Covid or have been in contact with anyone that has tested positive in the last 10 days please notify you surgeon.    Your procedure is scheduled on:  TUESDAY  January 12, 2024  Report to [facility] Main Entrance: [address] entrance where the [employer] is available.   Report to admitting at:  05:15   AM  Call this number if you have any questions or problems the morning of surgery [phone]  FOLLOW ANY ADDITIONAL PRE OP INSTRUCTIONS YOU RECEIVED FROM YOUR SURGEON'S OFFICE!!!   Dulcolax 20 mg (total) - Take 4 (four) of the 5 mg Dulcolax tablets with water  at 07:00 am the day prior to surgery.  Miralax  255 g - Mix with 64 oz Gatorade/Powerade.  Starting at 10:00 am ,Drink this gradually over the next few hours (8 oz glass every 15-30 minutes) until gone the day prior to surgery You should finish in 4  hours-6 hours.    Neomycin 1000 mg - At 2 pm, 3 pm and 10 pm after Miralax   bowel prep the day prior to surgery.  Metronidazole  1000 mg - At 2 pm, 3 pm and 10 pm after Miralax  bowel prep the day prior to surgery.   Drink plenty of clear liquids all evening to avoid getting dehydrated.    DRINK two (2) bottles of Pre-Surgery Clear Ensure drink starting at 6:00 pm the evening prior to your surgery to help prevent dehydration. Increase drinking clear fluids (see list below)          Do not eat food after Midnight the night prior to your surgery/procedure.  After Midnight you may have the following liquids until  04:30 AM DAY OF SURGERY  Clear Liquid Diet Water  Black Coffee (sugar ok, NO MILK/CREAM OR CREAMERS)  Tea (sugar ok, NO MILK/CREAM OR CREAMERS) regular and decaf                             Plain Jell-O  with no fruit (NO RED)                                           Fruit ices (not with fruit pulp, NO RED)  Popsicles (NO RED)                                                                  Juice: NO CITRUS JUICES: only apple, WHITE grape, WHITE cranberry Sports drinks like Gatorade or Powerade (NO RED)                   The day of surgery:  Drink ONE (1) Pre-Surgery Clear Ensure at   04:30 AM the morning of surgery. Drink in one sitting. Do not sip.  This drink was given to you during your hospital pre-op appointment visit. Nothing else to drink after completing the Pre-Surgery Clear Ensure  : No candy, chewing gum or throat lozenges.     Oral Hygiene is also important to reduce your risk of infection.        Remember - BRUSH YOUR TEETH THE MORNING OF SURGERY WITH YOUR REGULAR TOOTHPASTE  Do NOT smoke after Midnight the night before surgery.  STOP TAKING all Vitamins, Herbs and supplements 1 week before your surgery.   XARELTO - Stop taking 48 hours before your surgery. Last dose will be taken on Saturday 01-09-24  Take ONLY these  medicines the morning of surgery with A SIP OF WATER : Amlodipine , Metoprolol , cetirizine,  You may use your Eye drops and Flonase  nasal spray if needed.                      You may not have any metal on your body including hair pins, jewelry, and body piercing  Do not wear make-up, lotions, powders, perfumes  or deodorant  Do not wear nail polish including gel and S&S, artificial / acrylic nails, or any other type of covering on natural nails including finger and toenails. If you have artificial nails, gel coating, etc., that needs to be removed by a nail salon, Please have this removed prior to surgery. Not doing so may mean that your surgery could be cancelled or delayed if the Surgeon or anesthesia staff feels like they are unable to monitor you safely.   Do not shave 48 hours prior to surgery to avoid nicks in your skin which may contribute to postoperative infections.   Contacts, Hearing Aids, dentures or bridgework may not be worn into surgery. DENTURES WILL BE REMOVED PRIOR TO SURGERY PLEASE DO NOT APPLY Poly grip OR ADHESIVES!!!  You may bring a small overnight bag with you on the day of surgery, only pack items that are not valuable. [name] IS NOT RESPONSIBLE   FOR VALUABLES THAT ARE LOST OR STOLEN.   Do not bring your home medications to the hospital. The Pharmacy will dispense medications listed on your medication list to you during your admission in the Hospital.  Special Instructions: Bring a copy of your healthcare power of attorney and living will documents the day of surgery, if you wish to have them scanned into your [facility] [facility] Records- EPIC  Please read over the following fact sheets you were given: IF YOU HAVE QUESTIONS ABOUT YOUR PRE-OP INSTRUCTIONS, PLEASE CALL [phone]   [facility] - Preparing for Surgery Before surgery, you can play an important role.  Because skin is not sterile, your skin needs to be as free of germs as  possible.  You can reduce  the number of germs on your skin by washing with CHG (chlorahexidine gluconate) soap before surgery.  CHG is an antiseptic cleaner which kills germs and bonds with the skin to continue killing germs even after washing. Please DO NOT use if you have an allergy to CHG or antibacterial soaps.  If your skin becomes reddened/irritated stop using the CHG and inform your nurse when you arrive at Short Stay. Do not shave (including legs and underarms) for at least 48 hours prior to the first CHG shower.  You may shave your face/neck.  Please follow these instructions carefully:  1.  Shower with CHG Soap the night before surgery and the  morning of surgery.  2.  If you choose to wash your hair, wash your hair first as usual with your normal  shampoo.  3.  After you shampoo, rinse your hair and body thoroughly to remove the shampoo.                             4.  Use CHG as you would any other liquid soap.  You can apply chg directly to the skin and wash.  Gently with a scrungie or clean washcloth.  5.  Apply the CHG Soap to your body ONLY FROM THE NECK DOWN.   Do not use on face/ open                           Wound or open sores. Avoid contact with eyes, ears mouth and genitals (private parts).                       Wash face,  Genitals (private parts) with your normal soap.             6.  Wash thoroughly, paying special attention to the area where your  surgery  will be performed.  7.  Thoroughly rinse your body with warm water  from the neck down.  8.  DO NOT shower/wash with your normal soap after using and rinsing off the CHG Soap.            9.  Pat yourself dry with a clean towel.            10.  Wear clean pajamas.            11.  Place clean sheets on your bed the night of your first shower and do not  sleep with pets.  ON THE DAY OF SURGERY : Do not apply any lotions/deodorants the morning of surgery.  Please wear clean clothes to the hospital/surgery center.     FAILURE TO FOLLOW THESE  INSTRUCTIONS MAY RESULT IN THE CANCELLATION OF YOUR SURGERY  PATIENT SIGNATURE_________________________________  NURSE SIGNATURE__________________________________  ________________________________________________________________________        [name]    An incentive spirometer is a tool that can help keep your lungs clear and active. This tool measures how well you are filling your lungs with each breath. Taking long deep breaths may help reverse or decrease the chance of developing breathing (pulmonary) problems (especially infection) following: A long period of time when you are unable to move or be active. BEFORE THE PROCEDURE  If the spirometer includes an indicator to show your best effort, your nurse or respiratory therapist will set it to a desired goal. If possible, sit up straight  or lean slightly forward. Try not to slouch. Hold the incentive spirometer in an upright position. INSTRUCTIONS FOR USE  Sit on the edge of your bed if possible, or sit up as far as you can in bed or on a chair. Hold the incentive spirometer in an upright position. Breathe out normally. Place the mouthpiece in your mouth and seal your lips tightly around it. Breathe in slowly and as deeply as possible, raising the piston or the ball toward the top of the column. Hold your breath for 3-5 seconds or for as long as possible. Allow the piston or ball to fall to the bottom of the column. Remove the mouthpiece from your mouth and breathe out normally. Rest for a few seconds and repeat Steps 1 through 7 at least 10 times every 1-2 hours when you are awake. Take your time and take a few normal breaths between deep breaths. The spirometer may include an indicator to show your best effort. Use the indicator as a goal to work toward during each repetition. After each set of 10 deep breaths, practice coughing to be sure your lungs are clear. If you have an incision (the cut made at the time of  surgery), support your incision when coughing by placing a pillow or rolled up towels firmly against it. Once you are able to get out of bed, walk around indoors and cough well. You may stop using the incentive spirometer when instructed by your caregiver.  RISKS AND COMPLICATIONS Take your time so you do not get dizzy or light-headed. If you are in pain, you may need to take or ask for pain medication before doing incentive spirometry. It is harder to take a deep breath if you are having pain. AFTER USE Rest and breathe slowly and easily. It can be helpful to keep track of a log of your progress. Your caregiver can provide you with a simple table to help with this. If you are using the spirometer at home, follow these instructions: SEEK MEDICAL CARE IF:  You are having difficultly using the spirometer. You have trouble using the spirometer as often as instructed. Your pain medication is not giving enough relief while using the spirometer. You develop fever of 100.5 F (38.1 C) or higher.                                                                                                    SEEK IMMEDIATE MEDICAL CARE IF:  You cough up bloody sputum that had not been present before. You develop fever of 102 F (38.9 C) or greater. You develop worsening pain at or near the incision site. MAKE SURE YOU:  Understand these instructions. Will watch your condition. Will get help right away if you are not doing well or get worse. Document Released: 11/03/2006 Document Revised: 09/15/2011 Document Reviewed: 01/04/2007 [facility] Patient Information 2014 [place], [place].       WHAT IS A BLOOD TRANSFUSION? Blood Transfusion Information  A transfusion is the replacement of blood or some of its parts. Blood is made up of multiple cells which provide  different functions. Red blood cells carry oxygen  and are used for blood loss replacement. White blood cells fight against infection. Platelets control  bleeding. Plasma helps clot blood. Other blood products are available for specialized needs, such as hemophilia or other clotting disorders. BEFORE THE TRANSFUSION  Who gives blood for transfusions?  Healthy volunteers who are fully evaluated to make sure their blood is safe. This is blood bank blood. Transfusion therapy is the safest it has ever been in the practice of medicine. Before blood is taken from a donor, a complete history is taken to make sure that person has no history of diseases nor engages in risky social behavior (examples are intravenous drug use or sexual activity with multiple partners). The donor's travel history is screened to minimize risk of transmitting infections, such as malaria. The donated blood is tested for signs of infectious diseases, such as HIV and hepatitis. The blood is then tested to be sure it is compatible with you in order to minimize the chance of a transfusion reaction. If you or a relative donates blood, this is often done in anticipation of surgery and is not appropriate for emergency situations. It takes many days to process the donated blood. RISKS AND COMPLICATIONS Although transfusion therapy is very safe and saves many lives, the main dangers of transfusion include:  Getting an infectious disease. Developing a transfusion reaction. This is an allergic reaction to something in the blood you were given. Every precaution is taken to prevent this. The decision to have a blood transfusion has been considered carefully by your caregiver before blood is given. Blood is not given unless the benefits outweigh the risks. AFTER THE TRANSFUSION Right after receiving a blood transfusion, you will usually feel much better and more energetic. This is especially true if your red blood cells have gotten low (anemic). The transfusion raises the level of the red blood cells which carry oxygen , and this usually causes an energy increase. The nurse administering the  transfusion will monitor you carefully for complications. HOME CARE INSTRUCTIONS  No special instructions are needed after a transfusion. You may find your energy is better. Speak with your caregiver about any limitations on activity for underlying diseases you may have. SEEK MEDICAL CARE IF:  Your condition is not improving after your transfusion. You develop redness or irritation at the intravenous (IV) site. SEEK IMMEDIATE MEDICAL CARE IF:  Any of the following symptoms occur over the next 12 hours: Shaking chills. You have a temperature by mouth above 102 F (38.9 C), not controlled by medicine. Chest, back, or muscle pain. People around you feel you are not acting correctly or are confused. Shortness of breath or difficulty breathing. Dizziness and fainting. You get a rash or develop hives. You have a decrease in urine output. Your urine turns a dark color or changes to pink, red, or brown. Any of the following symptoms occur over the next 10 days: You have a temperature by mouth above 102 F (38.9 C), not controlled by medicine. Shortness of breath. Weakness after normal activity. The white part of the eye turns yellow (jaundice). You have a decrease in the amount of urine or are urinating less often. Your urine turns a dark color or changes to pink, red, or brown. Document Released: 06/20/2000 Document Revised: 09/15/2011 Document Reviewed: 02/07/2008 [facility] Patient Information 2014 [place], [place].  _______________________________________________________________________

## 2024-01-01 ENCOUNTER — Encounter (HOSPITAL_COMMUNITY)
Admission: RE | Admit: 2024-01-01 | Discharge: 2024-01-01 | Disposition: A | Discharge status: Home / Self Care | Source: Ambulatory Visit | Attending: General Surgery | Admitting: General Surgery

## 2024-01-01 ENCOUNTER — Encounter (HOSPITAL_COMMUNITY): Payer: Self-pay

## 2024-01-01 ENCOUNTER — Ambulatory Visit: Payer: Self-pay | Admitting: Internal Medicine

## 2024-01-01 ENCOUNTER — Other Ambulatory Visit: Payer: Self-pay

## 2024-01-01 VITALS — BP 122/58 | HR 80 | Temp 99.2°F | Resp 16 | Ht 63.0 in | Wt 151.0 lb

## 2024-01-01 DIAGNOSIS — N321 Vesicointestinal fistula: Secondary | ICD-10-CM | POA: Diagnosis not present

## 2024-01-01 DIAGNOSIS — I480 Paroxysmal atrial fibrillation: Secondary | ICD-10-CM | POA: Diagnosis not present

## 2024-01-01 DIAGNOSIS — M069 Rheumatoid arthritis, unspecified: Secondary | ICD-10-CM | POA: Insufficient documentation

## 2024-01-01 DIAGNOSIS — Z01810 Encounter for preprocedural cardiovascular examination: Secondary | ICD-10-CM | POA: Diagnosis present

## 2024-01-01 DIAGNOSIS — Z01818 Encounter for other preprocedural examination: Secondary | ICD-10-CM | POA: Diagnosis not present

## 2024-01-01 DIAGNOSIS — Z7901 Long term (current) use of anticoagulants: Secondary | ICD-10-CM | POA: Insufficient documentation

## 2024-01-01 DIAGNOSIS — I10 Essential (primary) hypertension: Secondary | ICD-10-CM | POA: Insufficient documentation

## 2024-01-01 DIAGNOSIS — K5732 Diverticulitis of large intestine without perforation or abscess without bleeding: Secondary | ICD-10-CM | POA: Insufficient documentation

## 2024-01-01 DIAGNOSIS — Z79899 Other long term (current) drug therapy: Secondary | ICD-10-CM | POA: Insufficient documentation

## 2024-01-01 DIAGNOSIS — I739 Peripheral vascular disease, unspecified: Secondary | ICD-10-CM | POA: Insufficient documentation

## 2024-01-01 DIAGNOSIS — Z7952 Long term (current) use of systemic steroids: Secondary | ICD-10-CM | POA: Insufficient documentation

## 2024-01-01 DIAGNOSIS — R7989 Other specified abnormal findings of blood chemistry: Secondary | ICD-10-CM | POA: Diagnosis not present

## 2024-01-01 DIAGNOSIS — Z01812 Encounter for preprocedural laboratory examination: Secondary | ICD-10-CM | POA: Diagnosis present

## 2024-01-01 HISTORY — DX: Anxiety disorder, unspecified: F41.9

## 2024-01-01 HISTORY — DX: Anemia, unspecified: D64.9

## 2024-01-01 HISTORY — DX: Cardiac arrhythmia, unspecified: I49.9

## 2024-01-01 HISTORY — DX: Gastro-esophageal reflux disease without esophagitis: K21.9

## 2024-01-01 HISTORY — DX: Thrombocytosis, unspecified: D75.839

## 2024-01-01 HISTORY — DX: Essential (primary) hypertension: I10

## 2024-01-01 LAB — COMPLETE METABOLIC PANEL WITHOUT GFR
AG Ratio: 1.6 (calc) (ref 1.0–2.5)
ALT: 21 U/L (ref 6–29)
AST: 13 U/L (ref 10–35)
Albumin: 3.9 g/dL (ref 3.6–5.1)
Alkaline phosphatase (APISO): 50 U/L (ref 37–153)
BUN: 8 mg/dL (ref 7–25)
CO2: 28 mmol/L (ref 20–32)
Calcium: 9.5 mg/dL (ref 8.6–10.4)
Chloride: 104 mmol/L (ref 98–110)
Creat: 0.79 mg/dL (ref 0.50–1.05)
Globulin: 2.4 g/dL (ref 1.9–3.7)
Glucose, Bld: 101 mg/dL — ABNORMAL HIGH (ref 65–99)
Potassium: 3.6 mmol/L (ref 3.5–5.3)
Sodium: 141 mmol/L (ref 135–146)
Total Bilirubin: 0.4 mg/dL (ref 0.2–1.2)
Total Protein: 6.3 g/dL (ref 6.1–8.1)

## 2024-01-01 LAB — COMPREHENSIVE METABOLIC PANEL WITH GFR
ALT: 18 U/L (ref 0–44)
AST: 15 U/L (ref 15–41)
Albumin: 3.3 g/dL — ABNORMAL LOW (ref 3.5–5.0)
Alkaline Phosphatase: 50 U/L (ref 38–126)
Anion gap: 11 (ref 5–15)
BUN: 9 mg/dL (ref 8–23)
CO2: 24 mmol/L (ref 22–32)
Calcium: 8.4 mg/dL — ABNORMAL LOW (ref 8.9–10.3)
Chloride: 100 mmol/L (ref 98–111)
Creatinine, Ser: 0.86 mg/dL (ref 0.44–1.00)
GFR, Estimated: >60 mL/min (ref >60)
Glucose, Bld: 126 mg/dL — ABNORMAL HIGH (ref 70–99)
Potassium: 3.2 mmol/L — ABNORMAL LOW (ref 3.5–5.1)
Sodium: 135 mmol/L (ref 135–145)
Total Bilirubin: 0.9 mg/dL (ref 0.0–1.2)
Total Protein: 6.6 g/dL (ref 6.5–8.1)

## 2024-01-01 LAB — HEMOGLOBIN A1C
Hgb A1c MFr Bld: 5.6 % (ref 4.8–5.6)
Mean Plasma Glucose: 114.02 mg/dL

## 2024-01-01 LAB — CBC
HCT: 43.6 % (ref 35.0–45.0)
HCT: 43.6 % (ref 36.0–46.0)
Hemoglobin: 13.7 g/dL (ref 11.7–15.5)
Hemoglobin: 13.3 g/dL (ref 12.0–15.0)
MCH: 29.6 pg (ref 27.0–33.0)
MCH: 29.6 pg (ref 26.0–34.0)
MCHC: 31.4 g/dL — ABNORMAL LOW (ref 32.0–36.0)
MCHC: 30.5 g/dL (ref 30.0–36.0)
MCV: 94.2 fL (ref 80.0–100.0)
MCV: 97.1 fL (ref 80.0–100.0)
MPV: 9.4 fL (ref 7.5–12.5)
Platelets: 502 10*3/uL — ABNORMAL HIGH (ref 140–400)
Platelets: 558 10*3/uL — ABNORMAL HIGH (ref 150–400)
RBC: 4.63 10*6/uL (ref 3.80–5.10)
RBC: 4.49 MIL/uL (ref 3.87–5.11)
RDW: 14.5 % (ref 11.0–15.0)
RDW: 14.8 % (ref 11.5–15.5)
WBC: 16.1 10*3/uL — ABNORMAL HIGH (ref 3.8–10.8)
WBC: 16.3 10*3/uL — ABNORMAL HIGH (ref 4.0–10.5)
nRBC: 0 % (ref 0.0–0.2)

## 2024-01-01 LAB — TYPE AND SCREEN
ABO/RH(D): A POS
Antibody Screen: NEGATIVE

## 2024-01-01 LAB — C-REACTIVE PROTEIN: CRP: <3 mg/L (ref <8.0)

## 2024-01-01 LAB — SEDIMENTATION RATE: Sed Rate: 22 mm/h (ref 0–30)

## 2024-01-01 NOTE — Consult Note (Addendum)
 WOC Nurse requested for preoperative stoma site marking. Pt is familiar with ostomy care since she previously had an ostomy which was reversed.   Discussed surgical procedure and possible stoma creation with patient and husband.  Explained role of the WOC nurse team.  Provided the patient with educational booklet and provided samples of pouching options.  Answered patient and husband's questions.   Examined patient sitting, and standing in order to place the marking in the patient's visual field, away from any creases or abdominal contour issues and within the rectus muscle.   Attempted to mark below the patient's belt line, but this was not possible, since a significant crease occurs lower on the abd when she leans forward which should be avoided if possible.  Previous ostomy site was on the right lower quad and this should also be avoided.    Marked for colostomy in the LLQ  __8__ cm to the left of the umbilicus and __2__cm above the umbilicus.  Marked for ileostomy in the RLQ  __6__cm to the right of the umbilicus and  ___2_ cm above the umbilicus.  Patient's abdomen cleansed with CHG wipes at site markings, allowed to air dry prior to marking. Covered mark with thin film transparent dressing to preserve mark until date of surgery. Provided with marking pen and told to re-color in mark if it begins to fade prior to surgery.   WOC Nurse team will follow up with patient after surgery for continued ostomy care and teaching if she receives an ostomy.  Please re-consult if further assistance is needed.  Thank-you,  Stephane Fought MSN, RN, CWOCN, [place], CNS [phone]

## 2024-01-04 ENCOUNTER — Encounter (HOSPITAL_COMMUNITY)

## 2024-01-04 NOTE — Anesthesia Preprocedure Evaluation (Addendum)
 Anesthesia Evaluation  Patient identified by MRN, date of birth, ID band Patient awake    Reviewed: Allergy & Precautions, H&P , NPO status , Patient's Chart, lab work & pertinent test results  History of Anesthesia Complications (+) PONV and history of anesthetic complications  Airway Mallampati: III  TM Distance: >3 FB Neck ROM: Full    Dental no notable dental hx. (+) Teeth Intact, Dental Advisory Given   Pulmonary neg pulmonary ROS, former smoker   Pulmonary exam normal breath sounds clear to auscultation       Cardiovascular Exercise Tolerance: Good hypertension, Pt. on medications and Pt. on home beta blockers + Peripheral Vascular Disease  + dysrhythmias Atrial Fibrillation  Rhythm:Regular Rate:Normal     Neuro/Psych   Anxiety     negative neurological ROS     GI/Hepatic Neg liver ROS,GERD  ,,  Endo/Other  negative endocrine ROS    Renal/GU negative Renal ROS  negative genitourinary   Musculoskeletal  (+) Arthritis ,    Abdominal   Peds  Hematology  (+) Blood dyscrasia, anemia   Anesthesia Other Findings   Reproductive/Obstetrics negative OB ROS                              Anesthesia Physical Anesthesia Plan  ASA: 3  Anesthesia Plan: General   Post-op Pain Management: Ofirmev  IV (intra-op)*   Induction: Intravenous  PONV Risk Score and Plan: 4 or greater and Ondansetron  and Dexamethasone   Airway Management Planned: Oral ETT  Additional Equipment:   Intra-op Plan:   Post-operative Plan: Extubation in OR  Informed Consent: I have reviewed the patients History and Physical, chart, labs and discussed the procedure including the risks, benefits and alternatives for the proposed anesthesia with the patient or authorized representative who has indicated his/her understanding and acceptance.     Dental advisory given  Plan Discussed with: CRNA  Anesthesia Plan  Comments: (See PAT note 01/01/2024)        Anesthesia Quick Evaluation

## 2024-01-04 NOTE — Progress Notes (Signed)
 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 former smoker with h/o PONV, HTN, RA on daily prednisone , paroxysmal a-fib, PAD, diverticulitis scheduled for above procedure 01/12/2024 with Dr. Herlene Bureau and Dr. Valli Elisabeth.   Pt with admission 5/6-5/14 for recurrent diverticulitis/abscess, no surgery intervention during admission.  PERC drain placed during admission, this has been removed. Pt currently has PICC line in place. A-fib stable during admission, she remains on Toprol -XL, amiodarone , and Xarelto .   Pt advised to hold Xarelto  2 days prior to surgery.   Pt last seen by cardio 06/2023. No changes made at this visit.  9 month follow up scheduled.  Pt reports she can climb a flight of stairs without difficulty, without chest pain or shortness of breath.   Per cardio notes: 2022:stress echo patient achieved 89% of MPHR, no ischemia on electrocardiogram, ejection fraction augmented from 60-70%. Low risk stress test.   VS: BP (!) 122/58 Comment: left arm sitting  Pulse 80   Temp 37.3 C (Oral)   Resp 16   Ht 5' 3 (1.6 m)   Wt 68.5 kg   SpO2 98%   BMI 26.75 kg/m   PROVIDERS: [name], Venetia, PA-C is PCP    LABS: Labs reviewed: Acceptable for surgery. (all labs ordered are listed, but only abnormal results are displayed)  Labs Reviewed  COMPREHENSIVE METABOLIC PANEL WITH GFR - Abnormal; Notable for the  following components:      Result Value   Potassium 3.2 (*)    Glucose, Bld 126 (*)    Calcium  8.4 (*)    Albumin 3.3 (*)    All other components within normal limits  CBC - Abnormal; Notable for the following components:   WBC 16.3 (*)    Platelets 558 (*)    All other components within normal limits  HEMOGLOBIN A1C  TYPE AND SCREEN     IMAGES:   EKG:   CV: Echo 01/04/2014 Summary:   1. Overall left ventricular ejection fraction is estimated at 60 to 65%.   2. Normal global left ventricular systolic function.   3. Moderate concentric left ventricular hypertrophy.   4. Mildly dilated left atrium.   5. Grade III aortic arch atheroma.   6. Lipomatous hypertrophy of the atrial septum.   7. No intracardiac thrombi, mass or vegetations.  Past Medical History:  Diagnosis Date   Anemia    Anxiety    Diverticulitis    Dyslipidemia    Dysrhythmia    A.fib   GERD (gastroesophageal reflux disease)    Hypertension    PAD (peripheral artery disease) (HCC)    PONV (postoperative nausea and vomiting)    Psoriasis    RA (rheumatoid arthritis) (HCC)    Thrombocytosis    Vertigo     Past Surgical History:  Procedure Laterality Date   ANEURYSM COILING Right    right lower leg  BILIARY BRUSHING  05/16/2020   Procedure: BILIARY BRUSHING;  Surgeon: Golda Claudis PENNER, MD;  Location: [facility];  Service: Endoscopy;;   BILIARY BRUSHING  07/26/2020   Procedure: BILIARY BRUSHING;  Surgeon: Teressa Toribio SQUIBB, MD;  [facility];  Service: Endoscopy;;   BILIARY STENT PLACEMENT  01/05/2020   Procedure: BILIARY STENT PLACEMENT;  Surgeon: Golda Claudis PENNER, MD;  [facility];  Service: Endoscopy;;   BILIARY STENT PLACEMENT  05/16/2020   Procedure: BILIARY STENT PLACEMENT;  Surgeon: Golda Claudis PENNER, MD;  Location: [facility];  Service: Endoscopy;;   CHOLECYSTECTOMY  2005   done during bowel surgery   COLONOSCOPY  03/07/2015   Propofol ; Surgeon: Dr. Petel; Three 6 mm  rectosigmoid polyps, moderate left-sided diverticulosis, tortuous rectosigmoid colon. Pathology with inflamed hyperplastic polyp.   COLONOSCOPY WITH PROPOFOL  N/A 08/18/2019   non-bleeding internal hemorrhoids, pancolonic diverticulosis, two 4-7 mm polyps in rectum benign, no adenomas. Next colonoscopy in 10 years.    ENDOSCOPIC RETROGRADE CHOLANGIOPANCREATOGRAPHY (ERCP) WITH PROPOFOL  N/A 07/26/2020   Procedure: ENDOSCOPIC RETROGRADE CHOLANGIOPANCREATOGRAPHY (ERCP) WITH PROPOFOL ;  Surgeon: Teressa Toribio SQUIBB, MD;  [facility];  Service: Endoscopy;  Laterality: N/A;   ERCP N/A 01/05/2020   Procedure: ENDOSCOPIC RETROGRADE CHOLANGIOPANCREATOGRAPHY (ERCP);  Surgeon: Golda Claudis PENNER, MD;  [facility];  Service: Endoscopy;  Laterality: N/A;   ERCP N/A 05/16/2020   Procedure: ENDOSCOPIC RETROGRADE CHOLANGIOPANCREATOGRAPHY (ERCP);  Surgeon: Golda Claudis PENNER, MD;  Location[facility];  Service: Endoscopy;  Laterality: N/A;   ESOPHAGOGASTRODUODENOSCOPY (EGD) WITH PROPOFOL  N/A 07/26/2020   Procedure: ESOPHAGOGASTRODUODENOSCOPY (EGD) WITH PROPOFOL ;  Surgeon: Teressa Toribio SQUIBB, MD;  [facility];  Service: Endoscopy;  Laterality: N/A;   GASTROINTESTINAL STENT REMOVAL N/A 05/16/2020   Procedure: STENT REMOVAL;  Surgeon: Golda Claudis PENNER, MD;  Location[facility];  Service: Endoscopy;  Laterality: N/A;   HYSTERECTOMY ABDOMINAL WITH SALPINGO-OOPHORECTOMY     ILEOSTOMY  2005   per patient for bowel obstruction and peritonitis with anastomosis   INSERTION OF MESH     RLQ   IR RADIOLOGIST EVAL & MGMT  11/25/2023   POLYPECTOMY  08/18/2019   Procedure: POLYPECTOMY;  Surgeon: Shaaron Lamar HERO, MD;  [facility];  Service: Endoscopy;;   REMOVAL OF STONES N/A 01/05/2020   Procedure: REMOVAL OF STONES;  Surgeon: Golda Claudis PENNER, MD;  [facility];  Service: Endoscopy;  Laterality: N/A;   SPHINCTEROTOMY N/A 01/05/2020   Procedure: SPHINCTEROTOMY;  Surgeon: Golda Claudis PENNER, MD;  [facility];  Service: Endoscopy;  Laterality: N/A;   SPHINCTEROTOMY N/A 05/16/2020   Procedure: SPHINCTEROTOMY;  Surgeon: Golda Claudis PENNER, MD;  Location[facility];  Service: Endoscopy;  Laterality: N/A;   SPYGLASS CHOLANGIOSCOPY N/A 07/26/2020   Procedure: DEBHOJDD CHOLANGIOSCOPY;  Surgeon: Teressa Toribio SQUIBB, MD;  [facility];  Service: Endoscopy;  Laterality: N/A;   STENT REMOVAL  07/26/2020   Procedure: STENT REMOVAL;  Surgeon: Teressa Toribio SQUIBB, MD;  [facility];  Service: Endoscopy;;   UPPER ESOPHAGEAL ENDOSCOPIC ULTRASOUND (EUS) N/A 07/26/2020   Procedure: UPPER ESOPHAGEAL ENDOSCOPIC ULTRASOUND (EUS);  Surgeon: Teressa Toribio SQUIBB, MD;  Location: [facility];  Service: Endoscopy;  Laterality: N/A;    MEDICATIONS:  acidophilus (RISAQUAD) CAPS capsule   amiodarone  (PACERONE ) 200 MG tablet   amLODipine  (NORVASC ) 5 MG tablet   atorvastatin  (LIPITOR) 80 MG tablet   calcium -vitamin D (OSCAL WITH D) 500-5 MG-MCG tablet   cetirizine (ZYRTEC) 10 MG tablet   Cholecalciferol (  VITAMIN D3) 1.25 MG (50000 UT) CAPS   CINNAMON PO   Cyanocobalamin (VITAMIN B 12 PO)   Cyanocobalamin (VITAMIN B-12 IJ)   diphenhydrAMINE (BENADRYL) 50 MG capsule   ergocalciferol (VITAMIN D2) 1.25 MG (50000 UT) capsule   fluticasone  (FLONASE ) 50 MCG/ACT nasal spray   glucosamine-chondroitin 500-400 MG tablet   latanoprost (XALATAN) 0.005 % ophthalmic solution   Magnesium  Citrate (MAGNESIUM  GUMMIES PO)   metoprolol  succinate (TOPROL -XL) 50 MG 24 hr tablet   metroNIDAZOLE  (FLAGYL ) 500 MG tablet   predniSONE  (DELTASONE ) 5 MG tablet   sodium chloride  flush (NS) 0.9 % SOLN   XARELTO  20 MG TABS tablet   No current facility-administered medications for this encounter.    Harlene Hoots Ward, PA-C [facility] [phone]

## 2024-01-12 ENCOUNTER — Encounter (HOSPITAL_COMMUNITY)
Admission: RE | Disposition: A | Discharge status: Home / Self Care | Payer: Self-pay | Source: Home / Self Care | Attending: General Surgery

## 2024-01-12 ENCOUNTER — Other Ambulatory Visit: Payer: Self-pay

## 2024-01-12 ENCOUNTER — Inpatient Hospital Stay (HOSPITAL_COMMUNITY): Payer: Self-pay | Admitting: Physician Assistant

## 2024-01-12 ENCOUNTER — Encounter (HOSPITAL_COMMUNITY): Payer: Self-pay | Admitting: Anesthesiology

## 2024-01-12 ENCOUNTER — Encounter (HOSPITAL_COMMUNITY): Payer: Self-pay | Admitting: General Surgery

## 2024-01-12 ENCOUNTER — Inpatient Hospital Stay (HOSPITAL_COMMUNITY)
Admission: RE | Admit: 2024-01-12 | Discharge: 2024-01-15 | DRG: 329 | Disposition: A | Discharge status: Home / Self Care | Attending: General Surgery | Admitting: General Surgery

## 2024-01-12 DIAGNOSIS — I4811 Longstanding persistent atrial fibrillation: Secondary | ICD-10-CM | POA: Diagnosis present

## 2024-01-12 DIAGNOSIS — K5732 Diverticulitis of large intestine without perforation or abscess without bleeding: Secondary | ICD-10-CM

## 2024-01-12 DIAGNOSIS — I10 Essential (primary) hypertension: Secondary | ICD-10-CM | POA: Diagnosis present

## 2024-01-12 DIAGNOSIS — F419 Anxiety disorder, unspecified: Secondary | ICD-10-CM | POA: Diagnosis present

## 2024-01-12 DIAGNOSIS — Z79899 Other long term (current) drug therapy: Secondary | ICD-10-CM

## 2024-01-12 DIAGNOSIS — N390 Urinary tract infection, site not specified: Secondary | ICD-10-CM | POA: Diagnosis present

## 2024-01-12 DIAGNOSIS — Z87891 Personal history of nicotine dependence: Secondary | ICD-10-CM | POA: Diagnosis not present

## 2024-01-12 DIAGNOSIS — N321 Vesicointestinal fistula: Secondary | ICD-10-CM | POA: Diagnosis present

## 2024-01-12 DIAGNOSIS — Z885 Allergy status to narcotic agent status: Secondary | ICD-10-CM

## 2024-01-12 DIAGNOSIS — Z7901 Long term (current) use of anticoagulants: Secondary | ICD-10-CM

## 2024-01-12 DIAGNOSIS — K660 Peritoneal adhesions (postprocedural) (postinfection): Secondary | ICD-10-CM | POA: Diagnosis not present

## 2024-01-12 DIAGNOSIS — K5720 Diverticulitis of large intestine with perforation and abscess without bleeding: Secondary | ICD-10-CM | POA: Diagnosis present

## 2024-01-12 DIAGNOSIS — K651 Peritoneal abscess: Secondary | ICD-10-CM | POA: Diagnosis present

## 2024-01-12 DIAGNOSIS — I480 Paroxysmal atrial fibrillation: Secondary | ICD-10-CM | POA: Diagnosis not present

## 2024-01-12 DIAGNOSIS — I739 Peripheral vascular disease, unspecified: Secondary | ICD-10-CM | POA: Diagnosis present

## 2024-01-12 DIAGNOSIS — Z720 Tobacco use: Principal | ICD-10-CM

## 2024-01-12 DIAGNOSIS — K219 Gastro-esophageal reflux disease without esophagitis: Secondary | ICD-10-CM | POA: Diagnosis present

## 2024-01-12 HISTORY — PX: ROBOTIC ASSISTED LAPAROSCOPIC LYSIS OF ADHESION: SHX6080

## 2024-01-12 HISTORY — PX: COLECTOMY, SIGMOID, ROBOT-ASSISTED: SHX7542

## 2024-01-12 HISTORY — PX: ILEO LOOP DIVERSION: SHX1780

## 2024-01-12 HISTORY — PX: INCISION AND DRAINAGE ABSCESS: SHX5864

## 2024-01-12 HISTORY — PX: LIGATION OF INTERNAL FISTULA TRACT: SHX6551

## 2024-01-12 HISTORY — PX: CYSTOSCOPY WITH INDOCYANINE GREEN IMAGING (ICG): SHX7549

## 2024-01-12 LAB — CBC
HCT: 35.2 % — ABNORMAL LOW (ref 36.0–46.0)
Hemoglobin: 10.8 g/dL — ABNORMAL LOW (ref 12.0–15.0)
MCH: 29.5 pg (ref 26.0–34.0)
MCHC: 30.7 g/dL (ref 30.0–36.0)
MCV: 96.2 fL (ref 80.0–100.0)
Platelets: 453 K/uL — ABNORMAL HIGH (ref 150–400)
RBC: 3.66 MIL/uL — ABNORMAL LOW (ref 3.87–5.11)
RDW: 14.4 % (ref 11.5–15.5)
WBC: 22.5 K/uL — ABNORMAL HIGH (ref 4.0–10.5)
nRBC: 0 % (ref 0.0–0.2)

## 2024-01-12 LAB — CREATININE, SERUM
Creatinine, Ser: 1.3 mg/dL — ABNORMAL HIGH (ref 0.44–1.00)
GFR, Estimated: 45 mL/min — ABNORMAL LOW (ref >60)

## 2024-01-12 SURGERY — COLECTOMY, SIGMOID, ROBOT-ASSISTED
Anesthesia: General | Site: Abdomen | Laterality: Right

## 2024-01-12 MED ORDER — ACETAMINOPHEN 500 MG PO TABS
1000.0000 mg | ORAL_TABLET | ORAL | Status: AC
  Administered 2024-01-12: 1000 mg via ORAL
  Filled 2024-01-12: qty 2

## 2024-01-12 MED ORDER — BUPIVACAINE LIPOSOME 1.3 % IJ SUSP: 20.0000 mL | Freq: Once | INTRAMUSCULAR | Status: DC

## 2024-01-12 MED ORDER — LACTATED RINGERS IV SOLN: INTRAVENOUS | Status: DC

## 2024-01-12 MED ORDER — DROPERIDOL 2.5 MG/ML IJ SOLN
0.6250 mg | Freq: Once | INTRAMUSCULAR | Status: AC
  Administered 2024-01-12: 0.625 mg via INTRAVENOUS

## 2024-01-12 MED ORDER — ALVIMOPAN 12 MG PO CAPS: 12.0000 mg | ORAL_CAPSULE | Freq: Two times a day (BID) | ORAL | Status: DC

## 2024-01-12 MED ORDER — FENTANYL CITRATE (PF) 100 MCG/2ML IJ SOLN
INTRAMUSCULAR | Status: AC
  Filled 2024-01-12: qty 2

## 2024-01-12 MED ORDER — PROPOFOL 10 MG/ML IV BOLUS
INTRAVENOUS | Status: DC | PRN
  Administered 2024-01-12: 120 mg via INTRAVENOUS

## 2024-01-12 MED ORDER — PROPOFOL 10 MG/ML IV BOLUS
INTRAVENOUS | Status: AC
  Filled 2024-01-12: qty 20

## 2024-01-12 MED ORDER — DROPERIDOL 2.5 MG/ML IJ SOLN
INTRAMUSCULAR | Status: AC
  Filled 2024-01-12: qty 2

## 2024-01-12 MED ORDER — SUGAMMADEX SODIUM 200 MG/2ML IV SOLN
INTRAVENOUS | Status: DC | PRN
  Administered 2024-01-12: 200 mg via INTRAVENOUS

## 2024-01-12 MED ORDER — ALBUMIN HUMAN 5 % IV SOLN
INTRAVENOUS | Status: AC
  Filled 2024-01-12: qty 250

## 2024-01-12 MED ORDER — DEXAMETHASONE SODIUM PHOSPHATE 10 MG/ML IJ SOLN
INTRAMUSCULAR | Status: DC | PRN
  Administered 2024-01-12: 10 mg via INTRAVENOUS

## 2024-01-12 MED ORDER — SODIUM CHLORIDE 0.9 % IR SOLN
Status: DC | PRN
Start: 2024-01-12 — End: 2024-01-12
  Administered 2024-01-12: 1000 mL

## 2024-01-12 MED ORDER — ONDANSETRON HCL 4 MG PO TABS: 4.0000 mg | ORAL_TABLET | Freq: Four times a day (QID) | ORAL | Status: DC | PRN

## 2024-01-12 MED ORDER — ORAL CARE MOUTH RINSE
15.0000 mL | Freq: Once | OROMUCOSAL | Status: AC
Start: 2024-01-12 — End: 2024-01-12

## 2024-01-12 MED ORDER — SODIUM CHLORIDE (PF) 0.9 % IJ SOLN: INTRAMUSCULAR | Status: DC | PRN

## 2024-01-12 MED ORDER — SODIUM CHLORIDE 0.9 % IV SOLN
INTRAVENOUS | Status: AC
  Filled 2024-01-12: qty 2

## 2024-01-12 MED ORDER — ONDANSETRON HCL 4 MG/2ML IJ SOLN
4.0000 mg | Freq: Four times a day (QID) | INTRAMUSCULAR | Status: DC | PRN
  Filled 2024-01-12 (×2): qty 2

## 2024-01-12 MED ORDER — PHENYLEPHRINE HCL (PRESSORS) 10 MG/ML IV SOLN
INTRAVENOUS | Status: AC
  Filled 2024-01-12: qty 1

## 2024-01-12 MED ORDER — NEOMYCIN SULFATE 500 MG PO TABS: 1000.0000 mg | ORAL_TABLET | ORAL | Status: DC

## 2024-01-12 MED ORDER — ONDANSETRON HCL 4 MG/2ML IJ SOLN
INTRAMUSCULAR | Status: AC
  Filled 2024-01-12: qty 2

## 2024-01-12 MED ORDER — METHYLENE BLUE (ANTIDOTE) 1 % IV SOLN
INTRAVENOUS | Status: AC
  Filled 2024-01-12: qty 10

## 2024-01-12 MED ORDER — LIDOCAINE HCL (PF) 2 % IJ SOLN
INTRAMUSCULAR | Status: AC
  Filled 2024-01-12: qty 5

## 2024-01-12 MED ORDER — HYDROMORPHONE HCL 1 MG/ML IJ SOLN: 0.2500 mg | INTRAMUSCULAR | Status: DC | PRN

## 2024-01-12 MED ORDER — METOPROLOL TARTRATE 5 MG/5ML IV SOLN: 5.0000 mg | Freq: Four times a day (QID) | INTRAVENOUS | Status: DC | PRN

## 2024-01-12 MED ORDER — STERILE WATER FOR IRRIGATION IR SOLN
Status: DC | PRN
Start: 2024-01-12 — End: 2024-01-12
  Administered 2024-01-12: 1000 mL

## 2024-01-12 MED ORDER — METRONIDAZOLE 500 MG PO TABS: 1000.0000 mg | ORAL_TABLET | ORAL | Status: DC

## 2024-01-12 MED ORDER — FENTANYL CITRATE (PF) 100 MCG/2ML IJ SOLN
INTRAMUSCULAR | Status: DC | PRN
  Administered 2024-01-12 (×2): 50 ug via INTRAVENOUS

## 2024-01-12 MED ORDER — DEXAMETHASONE SODIUM PHOSPHATE 10 MG/ML IJ SOLN
INTRAMUSCULAR | Status: AC
  Filled 2024-01-12: qty 1

## 2024-01-12 MED ORDER — ACETAMINOPHEN 10 MG/ML IV SOLN
INTRAVENOUS | Status: DC | PRN
  Administered 2024-01-12: 1000 mg via INTRAVENOUS

## 2024-01-12 MED ORDER — ACETAMINOPHEN 10 MG/ML IV SOLN
INTRAVENOUS | Status: AC
  Filled 2024-01-12: qty 100

## 2024-01-12 MED ORDER — MORPHINE SULFATE (PF) 2 MG/ML IV SOLN
2.0000 mg | INTRAVENOUS | Status: DC | PRN
  Filled 2024-01-12: qty 1

## 2024-01-12 MED ORDER — ACETAMINOPHEN 325 MG PO TABS
650.0000 mg | ORAL_TABLET | Freq: Four times a day (QID) | ORAL | Status: DC | PRN
  Administered 2024-01-13 – 2024-01-15 (×3): 650 mg via ORAL
  Filled 2024-01-12 (×4): qty 2

## 2024-01-12 MED ORDER — HEPARIN SODIUM (PORCINE) 5000 UNIT/ML IJ SOLN
5000.0000 [IU] | Freq: Once | INTRAMUSCULAR | Status: AC
Start: 2024-01-12 — End: 2024-01-12
  Administered 2024-01-12: 5000 [IU] via SUBCUTANEOUS
  Filled 2024-01-12: qty 1

## 2024-01-12 MED ORDER — PHENYLEPHRINE HCL (PRESSORS) 10 MG/ML IV SOLN
INTRAVENOUS | Status: DC | PRN
  Administered 2024-01-12: 160 ug via INTRAVENOUS
  Administered 2024-01-12: 80 ug via INTRAVENOUS
  Administered 2024-01-12: 160 ug via INTRAVENOUS

## 2024-01-12 MED ORDER — DIPHENHYDRAMINE HCL 50 MG/ML IJ SOLN: 12.5000 mg | Freq: Four times a day (QID) | INTRAMUSCULAR | Status: DC | PRN

## 2024-01-12 MED ORDER — MIDAZOLAM HCL 2 MG/2ML IJ SOLN
INTRAMUSCULAR | Status: AC
  Filled 2024-01-12: qty 2

## 2024-01-12 MED ORDER — ALVIMOPAN 12 MG PO CAPS
12.0000 mg | ORAL_CAPSULE | ORAL | Status: AC
  Administered 2024-01-12: 12 mg via ORAL
  Filled 2024-01-12: qty 1

## 2024-01-12 MED ORDER — HYDROMORPHONE HCL 1 MG/ML IJ SOLN
INTRAMUSCULAR | Status: DC | PRN
  Administered 2024-01-12: .4 mg via INTRAVENOUS
  Administered 2024-01-12: .2 mg via INTRAVENOUS
  Administered 2024-01-12: .4 mg via INTRAVENOUS

## 2024-01-12 MED ORDER — CHLORHEXIDINE GLUCONATE 4 % EX SOLN: 1.0000 | Freq: Once | CUTANEOUS | Status: DC

## 2024-01-12 MED ORDER — STERILE WATER FOR IRRIGATION IR SOLN
Status: DC | PRN
  Administered 2024-01-12: 1 via INTRAVESICAL

## 2024-01-12 MED ORDER — BUPIVACAINE-EPINEPHRINE (PF) 0.25% -1:200000 IJ SOLN
INTRAMUSCULAR | Status: DC | PRN
  Administered 2024-01-12 (×2): 30 mL

## 2024-01-12 MED ORDER — HYDROMORPHONE HCL 2 MG/ML IJ SOLN
INTRAMUSCULAR | Status: AC
  Filled 2024-01-12: qty 1

## 2024-01-12 MED ORDER — ROCURONIUM BROMIDE 100 MG/10ML IV SOLN
INTRAVENOUS | Status: DC | PRN
  Administered 2024-01-12: 5 mg via INTRAVENOUS
  Administered 2024-01-12: 60 mg via INTRAVENOUS
  Administered 2024-01-12: 5 mg via INTRAVENOUS

## 2024-01-12 MED ORDER — DIPHENHYDRAMINE HCL 12.5 MG/5ML PO ELIX: 12.5000 mg | ORAL_SOLUTION | Freq: Four times a day (QID) | ORAL | Status: DC | PRN

## 2024-01-12 MED ORDER — OXYCODONE HCL 5 MG PO TABS
5.0000 mg | ORAL_TABLET | Freq: Four times a day (QID) | ORAL | Status: DC | PRN
  Administered 2024-01-12 – 2024-01-15 (×8): 5 mg via ORAL
  Filled 2024-01-12 (×8): qty 1

## 2024-01-12 MED ORDER — LIDOCAINE HCL (CARDIAC) PF 100 MG/5ML IV SOSY
PREFILLED_SYRINGE | INTRAVENOUS | Status: DC | PRN
  Administered 2024-01-12: 60 mg via INTRAVENOUS

## 2024-01-12 MED ORDER — ENSURE PRE-SURGERY PO LIQD
592.0000 mL | Freq: Once | ORAL | Status: DC
  Filled 2024-01-12: qty 592

## 2024-01-12 MED ORDER — ENSURE SURGERY PO LIQD
237.0000 mL | Freq: Two times a day (BID) | ORAL | Status: DC
  Administered 2024-01-13 – 2024-01-15 (×5): 237 mL via ORAL

## 2024-01-12 MED ORDER — GABAPENTIN 300 MG PO CAPS
300.0000 mg | ORAL_CAPSULE | ORAL | Status: AC
  Administered 2024-01-12: 300 mg via ORAL
  Filled 2024-01-12: qty 1

## 2024-01-12 MED ORDER — STERILE WATER FOR INJECTION IJ SOLN
INTRAMUSCULAR | Status: AC
  Filled 2024-01-12: qty 10

## 2024-01-12 MED ORDER — ONDANSETRON HCL 4 MG/2ML IJ SOLN
INTRAMUSCULAR | Status: DC | PRN
  Administered 2024-01-12: 4 mg via INTRAVENOUS

## 2024-01-12 MED ORDER — STERILE WATER FOR INJECTION IJ SOLN
INTRAMUSCULAR | Status: DC | PRN
  Administered 2024-01-12: 20 mL via INTRAMUSCULAR

## 2024-01-12 MED ORDER — ENSURE PRE-SURGERY PO LIQD
296.0000 mL | Freq: Once | ORAL | Status: DC
  Filled 2024-01-12: qty 296

## 2024-01-12 MED ORDER — GLYCOPYRROLATE 0.2 MG/ML IJ SOLN
INTRAMUSCULAR | Status: AC
  Filled 2024-01-12: qty 1

## 2024-01-12 MED ORDER — EPHEDRINE 5 MG/ML INJ
INTRAVENOUS | Status: AC
  Filled 2024-01-12: qty 5

## 2024-01-12 MED ORDER — PHENYLEPHRINE HCL-NACL 20-0.9 MG/250ML-% IV SOLN
INTRAVENOUS | Status: DC | PRN
  Administered 2024-01-12: 50 ug/min via INTRAVENOUS
  Administered 2024-01-12: 30 ug/min via INTRAVENOUS

## 2024-01-12 MED ORDER — SODIUM CHLORIDE 0.9 % IV SOLN
2.0000 g | INTRAVENOUS | Status: AC
  Administered 2024-01-12 (×2): 2 g via INTRAVENOUS
  Filled 2024-01-12: qty 2

## 2024-01-12 MED ORDER — EPHEDRINE SULFATE (PRESSORS) 50 MG/ML IJ SOLN
INTRAMUSCULAR | Status: DC | PRN
  Administered 2024-01-12: 5 mg via INTRAVENOUS

## 2024-01-12 MED ORDER — ROCURONIUM BROMIDE 10 MG/ML (PF) SYRINGE
PREFILLED_SYRINGE | INTRAVENOUS | Status: AC
  Filled 2024-01-12: qty 10

## 2024-01-12 MED ORDER — PHENYLEPHRINE 80 MCG/ML (10ML) SYRINGE FOR IV PUSH (FOR BLOOD PRESSURE SUPPORT)
PREFILLED_SYRINGE | INTRAVENOUS | Status: AC
  Filled 2024-01-12: qty 10

## 2024-01-12 MED ORDER — MIDAZOLAM HCL 5 MG/5ML IJ SOLN
INTRAMUSCULAR | Status: DC | PRN
  Administered 2024-01-12: 2 mg via INTRAVENOUS

## 2024-01-12 MED ORDER — CHLORHEXIDINE GLUCONATE 0.12 % MT SOLN
15.0000 mL | Freq: Once | OROMUCOSAL | Status: AC
  Administered 2024-01-12: 15 mL via OROMUCOSAL

## 2024-01-12 MED ORDER — 0.9 % SODIUM CHLORIDE (POUR BTL) OPTIME
TOPICAL | Status: DC | PRN
  Administered 2024-01-12 (×3): 1000 mL

## 2024-01-12 MED ORDER — SODIUM CHLORIDE 0.45 % IV SOLN: INTRAVENOUS | Status: AC

## 2024-01-12 MED ORDER — BUPIVACAINE-EPINEPHRINE (PF) 0.25% -1:200000 IJ SOLN
INTRAMUSCULAR | Status: AC
  Filled 2024-01-12: qty 30

## 2024-01-12 MED ORDER — ENOXAPARIN SODIUM 40 MG/0.4ML IJ SOSY
40.0000 mg | PREFILLED_SYRINGE | INTRAMUSCULAR | Status: DC
  Administered 2024-01-13 – 2024-01-14 (×2): 40 mg via SUBCUTANEOUS
  Filled 2024-01-12 (×2): qty 0.4

## 2024-01-12 MED ORDER — LACTATED RINGERS IR SOLN
Status: DC | PRN
Start: 2024-01-12 — End: 2024-01-12
  Administered 2024-01-12: 1000 mL

## 2024-01-12 MED ORDER — BUPIVACAINE-EPINEPHRINE (PF) 0.25% -1:200000 IJ SOLN
INTRAMUSCULAR | Status: AC
Start: 2024-01-12 — End: 2024-01-12
  Filled 2024-01-12: qty 30

## 2024-01-12 MED ORDER — ALBUMIN HUMAN 5 % IV SOLN: INTRAVENOUS | Status: DC | PRN

## 2024-01-12 SURGICAL SUPPLY — 108 items
BAG COUNTER SPONGE SURGICOUNT (BAG) ×3 IMPLANT
BAG URO CATCHER STRL LF (MISCELLANEOUS) ×3 IMPLANT
BARRIER SKIN 2 1/4 (WOUND CARE) ×3 IMPLANT
BARRIER SKIN OD1.75 2 1/4 FLNG (WOUND CARE) IMPLANT
BENZOIN TINCTURE PRP APPL 2/3 (GAUZE/BANDAGES/DRESSINGS) IMPLANT
BLADE EXTENDED COATED 6.5IN (ELECTRODE) IMPLANT
BLADE SURG 15 STRL LF DISP TIS (BLADE) IMPLANT
CATH URETL OPEN END 6FR 70 (CATHETERS) IMPLANT
CELLS DAT CNTRL 66122 CELL SVR (MISCELLANEOUS) IMPLANT
CHLORAPREP W/TINT 26 (MISCELLANEOUS) IMPLANT
CLIP APPLIE 5 13 M/L LIGAMAX5 (MISCELLANEOUS) IMPLANT
CLIP APPLIE ROT 10 11.4 M/L (STAPLE) IMPLANT
CLOTH BEACON ORANGE TIMEOUT ST (SAFETY) ×3 IMPLANT
CONNECTOR 5 IN 1 STRAIGHT STRL (MISCELLANEOUS) IMPLANT
COVER SURGICAL LIGHT HANDLE (MISCELLANEOUS) ×6 IMPLANT
COVER TIP SHEARS 8 DVNC (MISCELLANEOUS) IMPLANT
DEVICE TROCAR PUNCTURE CLOSURE (ENDOMECHANICALS) IMPLANT
DRAIN CHANNEL 19F RND (DRAIN) IMPLANT
DRAPE ARM DVNC X/XI (DISPOSABLE) ×12 IMPLANT
DRAPE COLUMN DVNC XI (DISPOSABLE) ×3 IMPLANT
DRAPE SURG IRRIG POUCH 19X23 (DRAPES) ×3 IMPLANT
DRIVER NDL LRG 8 DVNC XI (INSTRUMENTS) ×3 IMPLANT
DRIVER NDL MEGA SUTCUT DVNCXI (INSTRUMENTS) IMPLANT
DRIVER NDLE LRG 8 DVNC XI (INSTRUMENTS) IMPLANT
DRIVER NDLE MEGA SUTCUT DVNCXI (INSTRUMENTS) ×3 IMPLANT
DRSG OPSITE POSTOP 4X10 (GAUZE/BANDAGES/DRESSINGS) IMPLANT
DRSG OPSITE POSTOP 4X6 (GAUZE/BANDAGES/DRESSINGS) IMPLANT
DRSG OPSITE POSTOP 4X8 (GAUZE/BANDAGES/DRESSINGS) IMPLANT
DRSG TEGADERM 2-3/8X2-3/4 SM (GAUZE/BANDAGES/DRESSINGS) IMPLANT
ELECT PENCIL ROCKER SW 15FT (MISCELLANEOUS) ×3 IMPLANT
ELECT REM PT RETURN 15FT ADLT (MISCELLANEOUS) ×3 IMPLANT
ENDOLOOP SUT PDS II 0 18 (SUTURE) IMPLANT
EVACUATOR SILICONE 100CC (DRAIN) IMPLANT
GAUZE SPONGE 2X2 8PLY STRL LF (GAUZE/BANDAGES/DRESSINGS) ×3 IMPLANT
GLOVE BIO SURGEON STRL SZ 6.5 (GLOVE) ×3 IMPLANT
GLOVE BIOGEL PI IND STRL 6.5 (GLOVE) IMPLANT
GLOVE BIOGEL PI IND STRL 7.0 (GLOVE) ×9 IMPLANT
GLOVE BIOGEL PI IND STRL 8 (GLOVE) IMPLANT
GLOVE SURG LX STRL 7.5 STRW (GLOVE) ×3 IMPLANT
GLOVE SURG PROTEXIS BL SZ6.5 (GLOVE) ×3 IMPLANT
GLOVE SURG SS PI 7.0 STRL IVOR (GLOVE) ×9 IMPLANT
GLOVE SURG SYN 6.5 PF PI BL (GLOVE) IMPLANT
GLOVE SURG SYN 8.0 (GLOVE) ×9 IMPLANT
GLOVE SURG SYN 8.0 PF PI (GLOVE) IMPLANT
GOWN STRL REUS W/ TWL LRG LVL3 (GOWN DISPOSABLE) ×12 IMPLANT
GOWN STRL REUS W/ TWL XL LVL3 (GOWN DISPOSABLE) IMPLANT
GRASPER SUT TROCAR 14GX15 (MISCELLANEOUS) IMPLANT
GRASPER TIP-UP FEN DVNC XI (INSTRUMENTS) ×3 IMPLANT
GUIDEWIRE ANG ZIPWIRE 038X150 (WIRE) IMPLANT
GUIDEWIRE STR DUAL SENSOR (WIRE) IMPLANT
HOLDER FOLEY CATH W/STRAP (MISCELLANEOUS) ×3 IMPLANT
IRRIGATION SUCT STRKRFLW 2 WTP (MISCELLANEOUS) ×3 IMPLANT
KIT PROCEDURE DVNC SI (MISCELLANEOUS) ×3 IMPLANT
KIT SIGMOIDOSCOPE (SET/KITS/TRAYS/PACK) IMPLANT
KIT TURNOVER KIT A (KITS) ×3 IMPLANT
MANIFOLD NEPTUNE II (INSTRUMENTS) ×3 IMPLANT
NDL HYPO 22X1.5 SAFETY MO (MISCELLANEOUS) IMPLANT
NEEDLE HYPO 22X1.5 SAFETY MO (MISCELLANEOUS) ×9 IMPLANT
PACK CARDIOVASCULAR III (CUSTOM PROCEDURE TRAY) ×3 IMPLANT
PACK COLON (CUSTOM PROCEDURE TRAY) ×3 IMPLANT
PACK CYSTO (CUSTOM PROCEDURE TRAY) ×3 IMPLANT
PAD POSITIONING PINK XL (MISCELLANEOUS) ×3 IMPLANT
POUCH OSTOMY 2 PC DRNBL 2.25 (WOUND CARE) IMPLANT
PROTECTOR NERVE ULNAR (MISCELLANEOUS) ×6 IMPLANT
RELOAD STAPLE 45 3.5 BLU DVNC (STAPLE) IMPLANT
RELOAD STAPLE 45 4.3 GRN DVNC (STAPLE) IMPLANT
RELOAD STAPLE 60 3.5 BLU DVNC (STAPLE) IMPLANT
RELOAD STAPLE 60 4.3 GRN DVNC (STAPLE) IMPLANT
RELOAD STAPLER 3.5X45 BLU DVNC (STAPLE) IMPLANT
RELOAD STAPLER 3.5X60 BLU DVNC (STAPLE) ×3 IMPLANT
RELOAD STAPLER 4.3X45 GRN DVNC (STAPLE) IMPLANT
RELOAD STAPLER 4.3X60 GRN DVNC (STAPLE) ×3 IMPLANT
RETRACTOR WND ALEXIS 18 MED (MISCELLANEOUS) IMPLANT
SCISSORS LAP 5X35 DISP (ENDOMECHANICALS) ×3 IMPLANT
SCISSORS MNPLR CVD DVNC XI (INSTRUMENTS) ×3 IMPLANT
SEAL UNIV 5-12 XI (MISCELLANEOUS) ×12 IMPLANT
SEALER VESSEL EXT DVNC XI (MISCELLANEOUS) ×3 IMPLANT
SET CYSTO W/LG BORE CLAMP LF (SET/KITS/TRAYS/PACK) IMPLANT
SOL PREP POV-IOD 4OZ 10% (MISCELLANEOUS) IMPLANT
SOLUTION ELECTROSURG ANTI STCK (MISCELLANEOUS) ×3 IMPLANT
SPIKE FLUID TRANSFER (MISCELLANEOUS) ×3 IMPLANT
SPONGE DRAIN TRACH 4X4 STRL 2S (GAUZE/BANDAGES/DRESSINGS) IMPLANT
STAPLER 60 SUREFORM DVNC (STAPLE) IMPLANT
STAPLER ECHELON POWER CIR 29 (STAPLE) IMPLANT
STAPLER ECHELON POWER CIR 31 (STAPLE) IMPLANT
STRIP CLOSURE SKIN 1/2X4 (GAUZE/BANDAGES/DRESSINGS) IMPLANT
SURGILUBE 2OZ TUBE FLIPTOP (MISCELLANEOUS) IMPLANT
SUT ETHILON 2 0 PS N (SUTURE) IMPLANT
SUT MNCRL AB 4-0 PS2 18 (SUTURE) ×3 IMPLANT
SUT PDS AB 0 CT1 36 (SUTURE) ×6 IMPLANT
SUT PROLENE 0 CT 2 (SUTURE) IMPLANT
SUT PROLENE 2 0 KS (SUTURE) IMPLANT
SUT SILK 2 0 SH CR/8 (SUTURE) IMPLANT
SUT SILK 3 0 SH CR/8 (SUTURE) ×3 IMPLANT
SUT VIC AB 3-0 SH 18 (SUTURE) IMPLANT
SUT VICRYL 0 UR6 27IN ABS (SUTURE) ×3 IMPLANT
SUTURE V-LC BRB 180 2/0GR6GS22 (SUTURE) IMPLANT
SUTURE VLOC BRB 180 ABS3/0GR12 (SUTURE) IMPLANT
SYR 10ML LL (SYRINGE) IMPLANT
SYR 20ML LL LF (SYRINGE) IMPLANT
SYSTEM LAPSCP GELPORT 120MM (MISCELLANEOUS) IMPLANT
SYSTEM WOUND ALEXIS 18CM MED (MISCELLANEOUS) ×3 IMPLANT
TRAY FOLEY MTR SLVR 14FR STAT (SET/KITS/TRAYS/PACK) IMPLANT
TRAY FOLEY MTR SLVR 16FR STAT (SET/KITS/TRAYS/PACK) ×3 IMPLANT
TROCAR ADV FIXATION 5X100MM (TROCAR) ×3 IMPLANT
TUBING CONNECTING 10 (TUBING) ×9 IMPLANT
TUBING INSUFFLATION 10FT LAP (TUBING) ×3 IMPLANT
TUBING UROLOGY SET (TUBING) IMPLANT

## 2024-01-12 NOTE — H&P (Signed)
 Chief Complaint  Patient presents with  Follow-up  f/u from hospital, Diverticulitis   Subjective   Olivia [name] is a 68 y.o. female established patient in today for: History of Present Illness Olivia [name] is a 68 year old female with diverticulitis and a colonic fistula who presents for follow-up regarding ongoing symptoms and surgical planning.  She experiences passage of stool through the vagina, which is not heavy. She suspects a urinary tract infection due to the fistula and describes a sensation of 'peeing air', more pronounced than through the rectum.  She is on a PICC line for antibiotics for four weeks and uses many pads due to regular drainage.  Her past surgical history includes previous surgeries with a scar and mesh placement, which may complicate laparoscopic surgery due to limited space for operation. She recalls multiple hospitalizations this year and last year.  There is no problem list on file for this patient.  Outpatient Medications Prior to Visit  Medication Sig Dispense Refill  AMIOdarone  (PACERONE ) 200 MG tablet Take 200 mg by mouth once daily  amLODIPine  (NORVASC ) 5 MG tablet Take 5 mg by mouth 2 (two) times daily  calcium  carbonate-vitamin D3 (CALTRATE 600+D) 600 mg-10 mcg (400 unit) tablet Take 1 tablet by mouth 2 (two) times daily  dicyclomine  (BENTYL ) 10 mg capsule Take 10 mg by mouth  metoprolol  SUCCinate (TOPROL -XL) 50 MG XL tablet Take 50 mg by mouth 2 (two) times daily  predniSONE  (DELTASONE ) 5 MG tablet Take 5 mg by mouth once daily  rosuvastatin  (CRESTOR ) 20 MG tablet Take 40 mg by mouth at bedtime  sulfaSALAzine  (AZULFIDINE ) 500 mg tablet Take 1,000 mg by mouth 2 (two) times daily  XARELTO  20 mg tablet Take 20 mg by mouth once daily   No facility-administered medications prior to visit.    Objective   Vitals:  12/02/23 0948  PainSc: 0-No pain   There is no height or weight on file to calculate BMI. Physical Exam Constitutional:   Appearance: Normal appearance.  HENT:  Head: Normocephalic and atraumatic.  Pulmonary:  Effort: Pulmonary effort is normal.  Musculoskeletal:  General: Normal range of motion.  Cervical back: Normal range of motion.  Neurological:  General: No focal deficit present.  Mental Status: She is alert and oriented to person, place, and time. Mental status is at baseline.  Psychiatric:  Mood and Affect: Mood normal.  Behavior: Behavior normal.  Thought Content: Thought content normal.     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 Symptoms persist, requiring robotic-assisted surgery to excise affected colon and repair fistula. Previous surgical scars and mesh may complicate procedure. Urologist to assist with cystoscopy and ureteral stenting. Risks include potential ostomy, scar tissue complications. Benefits include fistula resolution and infection prevention. Informed consent obtained. - Schedule robotic-assisted surgery to remove affected colon segment and address fistula. - Coordinate with urologist for cystoscopy and ureteral stenting. - Plan for potential ostomy if significant abscess or perforation is encountered. - Arrange for hospital stay of 3-7 days post-surgery. - Ensure catheter placement for one week post-surgery. - Coordinate with insurance navigator for coverage details.  Urinary tract infection Recurrent UTIs secondary to colovaginal fistula, with fecaluria and pneumaturia. Antibiotics via PICC line due to lack of oral formulation. Surgery planned to address underlying cause. - Continue antibiotics via PICC line for four weeks. - Proceed with surgical  intervention to address fistula and prevent further infections. Diagnoses and all orders for this visit:  Diverticulitis of  large intestine without perforation or abscess without bleeding  Longstanding persistent atrial fibrillation (CMS/HHS-HCC)  Colovesical fistula

## 2024-01-12 NOTE — Plan of Care (Signed)
  Problem: Education: Goal: Understanding of discharge needs will improve Outcome: Progressing   Problem: Education: Goal: Knowledge of General Education information will improve Description: Including pain rating scale, medication(s)/side effects and non-pharmacologic comfort measures Outcome: Progressing   Problem: Health Behavior/Discharge Planning: Goal: Ability to manage health-related needs will improve Outcome: Progressing   

## 2024-01-12 NOTE — Anesthesia Procedure Notes (Signed)
 Procedure Name: Intubation Date/Time: 01/12/2024 7:46 AM  Performed by: Delores Duwaine SAUNDERS, CRNAPre-anesthesia Checklist: Patient identified, Emergency Drugs available, Suction available and Patient being monitored Patient Re-evaluated:Patient Re-evaluated prior to induction Oxygen  Delivery Method: Circle System Utilized Preoxygenation: Pre-oxygenation with 100% oxygen  Induction Type: IV induction Ventilation: Mask ventilation without difficulty Laryngoscope Size: Mac and 3 Grade View: Grade I Tube type: Oral Tube size: 7.0 mm Number of attempts: 1 Airway Equipment and Method: Stylet and Oral airway Placement Confirmation: ETT inserted through vocal cords under direct vision, positive ETCO2 and breath sounds checked- equal and bilateral Secured at: 20 (@ teeth) cm Tube secured with: Tape Dental Injury: Teeth and Oropharynx as per pre-operative assessment  Comments: No complications

## 2024-01-12 NOTE — Op Note (Signed)
 Operative Note  Preoperative diagnosis:  1. Diverticulitis with fistula  Postoperative diagnosis: 1.  Diverticulitis with fistula 2.  Colovesical fistula  Procedure(s): 1.  Cystoscopy with bilateral ureteral FireFly injections  Surgeon: Valli Shank, MD  Assistants:  None   Anesthesia:  General  Complications:  None  EBL:  <5 mL  Specimens: 1. None  Drains/Catheters: 1.  16 French Foley  Intraoperative findings:   Normal urethra Bilateral orthotopic Uos Debris in bladder, edematous bladder mucosa on posterior left bladder wall with debris consistent with fistulous tract  Indication:  The patient is a 68 yo woman with a history of diverticulitis with fistula requiring a partial colectomy with Dr. Herlene Bureau.  Urology has been consulted to performed cystoscopy with bilateral ureteral Fire Fly injection to aide in intraoperative ureteral identification. The patient has been consented for the above procedures, voices understanding and wishes to proceed.  The patient has been consented for the above procedures, voices understanding and wishes to procede  Description of procedure: After informed consent was obtained, the patient was brought to the operating room and general endotracheal anesthesia was administered. The patient was then placed in the dorsolithotomy position and prepped and draped in usual sterile fashion. A timeout was performed. A 21 French rigid cystoscope was then inserted into the urethral meatus and advanced into the bladder under direct vision. A complete bladder survey revealed evidence of colovesical fistula.   A 6 [place] open-ended catheter was then used to intubate the right ureteral orifice and a total of 7.5 mL of firefly solution diluted with 10 mL of saline was injected into the right collecting system. A similar maneuver was then carried out on the left with the same volume and concentration of firefly. The rigid cystoscope was then removed under  direct vision. A 16 French Foley catheter was then inserted and placed to gravity drainage. The patient tolerated the procedure well. Dr. Bureau then proceeded with their portion of the case  Plan:  Foley removal is at the discretion of the primary team.

## 2024-01-12 NOTE — Plan of Care (Signed)
  Problem: Education: Goal: Understanding of discharge needs will improve Outcome: Progressing Goal: Verbalization of understanding of the causes of altered bowel function will improve Outcome: Progressing   Problem: Activity: Goal: Ability to tolerate increased activity will improve Outcome: Progressing   Problem: Bowel/Gastric: Goal: Gastrointestinal status for postoperative course will improve Outcome: Progressing

## 2024-01-12 NOTE — Anesthesia Postprocedure Evaluation (Signed)
 Anesthesia Post Note  Patient: Olivia [name]  Procedure(s) Performed: COLECTOMY, SIGMOID, ROBOT-ASSISTED LIGATION, INTERNAL FISTULA TRACT LYSIS, ADHESIONS, ROBOT-ASSISTED, LAPAROSCOPIC (Abdomen) INCISION AND DRAINAGE, INTRA-ABDOMINAL ABSCESS CREATION, ILEOSTOMY, LOOP (Right: Abdomen) CYSTOSCOPY WITH INDOCYANINE GREEN  IMAGING (ICG)     Patient location during evaluation: PACU Anesthesia Type: General Level of consciousness: awake and alert Pain management: pain level controlled Vital Signs Assessment: post-procedure vital signs reviewed and stable Respiratory status: spontaneous breathing, nonlabored ventilation, respiratory function stable and patient connected to nasal cannula oxygen  Cardiovascular status: blood pressure returned to baseline and stable Postop Assessment: no apparent nausea or vomiting Anesthetic complications: no   No notable events documented.  Last Vitals:  Vitals:   01/12/24 1600 01/12/24 1615  BP: (!) 100/48 108/61  Pulse: 62 63  Resp: 16 12  Temp:  (!) 36.1 C  SpO2: 92% 93%    Last Pain:  Vitals:   01/12/24 1636  TempSrc:   PainSc: 0-No pain                 Adolphe Fortunato,W. EDMOND

## 2024-01-12 NOTE — Transfer of Care (Signed)
 Immediate Anesthesia Transfer of Care Note  Patient: Olivia [name]  Procedure(s) Performed: COLECTOMY, SIGMOID, ROBOT-ASSISTED LIGATION, INTERNAL FISTULA TRACT LYSIS, ADHESIONS, ROBOT-ASSISTED, LAPAROSCOPIC (Abdomen) INCISION AND DRAINAGE, INTRA-ABDOMINAL ABSCESS CREATION, ILEOSTOMY, LOOP (Right: Abdomen) CYSTOSCOPY WITH INDOCYANINE GREEN  IMAGING (ICG)  Patient Location: PACU  Anesthesia Type:General  Level of Consciousness: drowsy  Airway & Oxygen  Therapy: Patient Spontanous Breathing and Patient connected to face mask oxygen   Post-op Assessment: Report given to RN and Post -op Vital signs reviewed and stable  Post vital signs: Reviewed and stable  Last Vitals:  Vitals Value Taken Time  BP 108/56 01/12/24 15:09  Temp    Pulse 63 01/12/24 15:12  Resp 16 01/12/24 15:12  SpO2 99 % 01/12/24 15:12  Vitals shown include unfiled device data.  Last Pain:  Vitals:   01/12/24 0544  TempSrc: Oral         Complications: No notable events documented.

## 2024-01-12 NOTE — Op Note (Signed)
 Preoperative diagnosis: diverticulitis, colovesical fistula  Postoperative diagnosis: same   Procedure:  robotic sigmoid resection laparoscopic lysis of adhesions (60 min) laparoscopic drainage of intraabdominal abscess take down of colovesical fistula repair of bladder creation of loop ileostomy  Surgeon: Herlene Bureau, M.D.  Asst: Camellia Blush, M.D.  Anesthesia: GETA  Indications for procedure: Olivia [name] is a 68 y.o. year old female with symptoms of abdominal pain and recurrent UTIs. She presents for colon resection for diverticulitis  Description of procedure: The patient was brought into the operative suite. Anesthesia was administered with General endotracheal anesthesia. WHO checklist was applied. The patient was then placed in lithotomy position. The area was prepped and draped in the usual sterile fashion.  Next, a left subcostal incision was made. A 5mm trocar was used to gain access to the peritoneal cavity by optical entry technique. Pneumoperitoneum was applied with a high flow and low pressure. The laparoscope was reinserted to confirm position. 4 additional trocars were placed; 1 8 mm in the epigastrium, 1 8 mm trocar in the right mid abdomen, 1 5 mm trocar in the right lateral abdomen, and 1 12 mm trocar in the right lower quadrant.  There was a large pariah Tex type mesh in the ventral abdomen.  Blunt and sharp dissection was used to separate multiple loops of small intestine and omentum away from the mesh.  Small intestine was not examined afterwards and there were no injuries to the intestine.  Additional dissection of the intestines was used to free the intestine from the pelvic wall.  Additional dissection of the intestines which was sharp was used to free the ileum away from the cecum in order to identify an appropriate length of terminal ileum or possible ileostomy.  Patient was placed in steep Trendelenburg position.  The sigmoid colon was freed from the anterior  wall of the abdomen.  Next, the right peritoneal reflection of the mesocolon was incised and continued down into the pelvis.  Blunt dissection was then used to isolate the vessels to the sigmoid colon.  These were taken with vessel sealer.  White line of Toldt was taken with vessel sealer.  This allowed for good mobilization of the colon and was continued up towards the splenic flexure.  The right and left ureters was identified using firefly.  During dissection of the anterior abdominal wall away from the colon there did appear to be a colovesical fistula which was bluntly taken down.  In addition, there were multiple small abscesses along the anterior abdominal wall and left pelvic wall.  These were drained during dissection.  Blunt and sharp dissection were used to free the sigmoid colon from the lateral abdominal wall and the pelvic wall.  In order to get to a clean area of the rectum, the peritoneal reflection was divided to reach the mid rectum.  After full dissection, methylene blue  was infused into the bladder.  There is 1 area of clear leakage in the left bladder superior to the trigone area.  A 3 oh V-Loc suture was used to suture this closed.  Additional methylene blue  was used to test closure and it was found to be intact.  No other leakage was seen.  A healthy portion of the rectum was identified and dissection was used to completely isolated from the surrounding materials.  A green load robotic stapler was used to divide portion of the rectum A blue load robotic stapler was used to complete division of the rectum.  Length was checked to  ensure the proximal sigmoid colon was easily reached to the anastomosis.  Next, Pfannenstiel incision was made.  Cautery was used dissect down through subcutaneous tissues.  The fascia was divided horizontally and the rectus muscle freed from the anterior layer of the fascia.  Peritoneal cavity was entered and a wound where retractor was put in place.  Sigmoid  colon was brought out and a pursestring clamp was put in place over healthy portion of colon.  2-0 Francis needle was used for pursestring.  Additional 3 oh silks were used to reinforce the pursestring.  Sizers were used to determine appropriate size for anastomosis. 29 mm EEA stapler was opened and anvil placed into the end of the colon and pursestring secured around it.  This was then brought back into the peritoneal cavity.  Pneumoperitoneum was reapplied.  Anastomosis was performed with EEA stapler.  Saline was infused into the pelvis and flex sigmoidoscopy was performed.  Both donuts of the EEA stapler were examined and were intact.  Anastomosis was seen at 8 cm from the anal verge. No bubbles were seen.  Next, colorectal cleaning dirty was performed.  Peritoneum was closed with 0 Vicryl in running fashion.  Fascia was closed with 0 PDS in running fashion.  A loop of ileum was brought up through the right mid abdominal incision and ileostomy was created using 3-0 Vicryl's for brooking sutures.  Ostomy appliance was put in place.  A 19 [place] Blake drain was passed into the abdomen and brought out through the right lateral trocar with tip placing into the pelvis around anastomosis and bladder repair.  All incisions were closed with subcuticular 4-0 Monocryl.  Dressings were in place.  Patient woke from anesthesia brought back in stable condition.   Findings: Dense diverticulitis with colovesical fistula.  Multiple small abscesses within the colonic wall.  Intact anastomosis of the colon and rectum.  Specimen: Sigmoid colon  Implant: 61 French Blake with tip in the pelvis  Blood loss: 200 mL  Local anesthesia: 60 ml marcaine    Complications: None  Herlene Bureau, M.D. General, Bariatric, & Minimally Invasive Surgery [facility] [place]

## 2024-01-13 ENCOUNTER — Encounter (HOSPITAL_COMMUNITY): Payer: Self-pay | Admitting: General Surgery

## 2024-01-13 LAB — BASIC METABOLIC PANEL WITH GFR
Anion gap: 8 (ref 5–15)
BUN: 7 mg/dL — ABNORMAL LOW (ref 8–23)
CO2: 23 mmol/L (ref 22–32)
Calcium: 7.9 mg/dL — ABNORMAL LOW (ref 8.9–10.3)
Chloride: 105 mmol/L (ref 98–111)
Creatinine, Ser: 0.8 mg/dL (ref 0.44–1.00)
GFR, Estimated: >60 mL/min (ref >60)
Glucose, Bld: 94 mg/dL (ref 70–99)
Potassium: 3.7 mmol/L (ref 3.5–5.1)
Sodium: 136 mmol/L (ref 135–145)

## 2024-01-13 LAB — CBC
HCT: 33.8 % — ABNORMAL LOW (ref 36.0–46.0)
Hemoglobin: 10.6 g/dL — ABNORMAL LOW (ref 12.0–15.0)
MCH: 29.7 pg (ref 26.0–34.0)
MCHC: 31.4 g/dL (ref 30.0–36.0)
MCV: 94.7 fL (ref 80.0–100.0)
Platelets: 401 K/uL — ABNORMAL HIGH (ref 150–400)
RBC: 3.57 MIL/uL — ABNORMAL LOW (ref 3.87–5.11)
RDW: 14.4 % (ref 11.5–15.5)
WBC: 21.4 K/uL — ABNORMAL HIGH (ref 4.0–10.5)
nRBC: 0 % (ref 0.0–0.2)

## 2024-01-13 MED ORDER — CHLORHEXIDINE GLUCONATE CLOTH 2 % EX PADS
6.0000 | MEDICATED_PAD | Freq: Every day | CUTANEOUS | Status: DC
  Administered 2024-01-13 – 2024-01-15 (×3): 6 via TOPICAL

## 2024-01-13 MED ORDER — SODIUM CHLORIDE 0.9% FLUSH: 10.0000 mL | INTRAVENOUS | Status: DC | PRN

## 2024-01-13 NOTE — Progress Notes (Signed)
   01/13/24 1434  TOC Brief Assessment  Insurance and Status Reviewed  Patient has primary care physician Yes  Home environment has been reviewed resides in private residence  Prior level of function: Independent  Prior/Current Home Services No current home services  Social Drivers of Health Review SDOH reviewed no interventions necessary  Readmission risk has been reviewed Yes  Transition of care needs no transition of care needs at this time

## 2024-01-13 NOTE — Consult Note (Addendum)
 WOC Nurse ostomy consult note Pt had ileostomy surgery performed yesterday.   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  Stoma is red and viable, slightly above skin level, when visualized through the pouch which is intact with good seal.  Small amt liquid brown stool in the pouch.  Pt requests pouch change and teaching session be performed tomorrow at 0830.  6 sets of supplies ordered to the bedside for staff nurses' use: Use Supplies: barrier rings Gerlean # [phone] and convex justus Gerlean # [phone] Enrolled patient in [facility] DC program: Not yet. Thank-you,  Stephane Fought MSN, RN, CWOCN, [place], CNS [phone]

## 2024-01-13 NOTE — Plan of Care (Signed)
  Problem: Activity: Goal: Ability to tolerate increased activity will improve Outcome: Progressing   Problem: Bowel/Gastric: Goal: Gastrointestinal status for postoperative course will improve Outcome: Progressing   Problem: Health Behavior/Discharge Planning: Goal: Identification of community resources to assist with postoperative recovery needs will improve Outcome: Progressing

## 2024-01-13 NOTE — Progress Notes (Signed)
  1 Day Post-Op   Chief Complaint/Subjective: Pain well controlled, tolerating liquids well, ambulating  Objective: Vital signs in last 24 hours: Temp:  [97 F (36.1 C)-98.6 F (37 C)] 98.6 F (37 C) (07/09 0541) Pulse Rate:  [59-73] 73 (07/09 0541) Resp:  [10-17] 17 (07/09 0541) BP: (93-126)/(48-61) 126/52 (07/09 0541) SpO2:  [92 %-100 %] 97 % (07/09 0541) Weight:  [69 kg-72.4 kg] 69 kg (07/09 0500) Last BM Date : 01/12/24 (from ileostomy) Intake/Output from previous day: 07/08 0701 - 07/09 0700 In: 2824.6 [P.O.:360; I.V.:1914.6; IV Piggyback:550] Out: 3755 [Urine:3100; Drains:255; Stool:200; Blood:200]  PE: Gen: NAd Resp: nonlabored Card: RRR Abd: soft, incisions c/d/I, drain with serosang output, foley in place  Lab Results:  Recent Labs    01/12/24 1543 01/13/24 0532  WBC 22.5* 21.4*  HGB 10.8* 10.6*  HCT 35.2* 33.8*  PLT 453* 401*   Recent Labs    01/12/24 1543 01/13/24 0532  NA  --  136  K  --  3.7  CL  --  105  CO2  --  23  GLUCOSE  --  94  BUN  --  7*  CREATININE 1.30* 0.80  CALCIUM   --  7.9*   No results for input(s): LABPROT, INR in the last 72 hours.    Component Value Date/Time   NA 136 01/13/2024 0532   NA 143 12/07/2019 0751   K 3.7 01/13/2024 0532   CL 105 01/13/2024 0532   CO2 23 01/13/2024 0532   GLUCOSE 94 01/13/2024 0532   BUN 7 (L) 01/13/2024 0532   BUN 15 12/07/2019 0751   CREATININE 0.80 01/13/2024 0532   CREATININE 0.79 12/31/2023 1027   CALCIUM  7.9 (L) 01/13/2024 0532   PROT 6.6 01/01/2024 1110   PROT 6.4 09/07/2020 1311   ALBUMIN  3.3 (L) 01/01/2024 1110   ALBUMIN  3.9 09/07/2020 1311   AST 15 01/01/2024 1110   ALT 18 01/01/2024 1110   ALKPHOS 50 01/01/2024 1110   BILITOT 0.9 01/01/2024 1110   BILITOT <0.2 09/07/2020 1311   GFRNONAA >60 01/13/2024 0532   GFRAA >60 01/03/2020 1357    Assessment/Plan  s/p Procedure(s): COLECTOMY, SIGMOID, ROBOT-ASSISTED LIGATION, INTERNAL FISTULA TRACT CYSTOSCOPY WITH  INDOCYANINE GREEN  IMAGING (ICG) LYSIS, ADHESIONS, ROBOT-ASSISTED, LAPAROSCOPIC INCISION AND DRAINAGE, INTRA-ABDOMINAL ABSCESS CREATION, ILEOSTOMY, LOOP 01/12/2024    FEN - reg diet VTE - lovenox  ID - periop abx Disposition - continue foley catheter, plan for urogram prior to discharge or scheduled as outpatient, WOC consult   LOS: 1 day   I reviewed last 24 h vitals and pain scores, last 48 h intake and output, last 24 h labs and trends, and last 24 h imaging results.  This care required moderate level of medical decision making.   [name] [facility] Surgery at [facility] 01/13/2024, 8:05 AM Please see Amion for pager number during day hours 7:00am-4:30pm or 7:00am -11:30am on weekends

## 2024-01-14 LAB — SURGICAL PATHOLOGY

## 2024-01-14 MED ORDER — AMIODARONE HCL 200 MG PO TABS
100.0000 mg | ORAL_TABLET | Freq: Every day | ORAL | Status: DC
  Filled 2024-01-14 (×2): qty 0.5

## 2024-01-14 MED ORDER — VITAMIN B-12 1000 MCG PO TABS
3000.0000 ug | ORAL_TABLET | Freq: Every day | ORAL | Status: DC
  Administered 2024-01-14 – 2024-01-15 (×2): 3000 ug via ORAL
  Filled 2024-01-14 (×2): qty 3

## 2024-01-14 MED ORDER — CHOLECALCIFEROL 125 MCG (5000 UT) PO CAPS: 5000.0000 [IU] | ORAL_CAPSULE | ORAL | Status: DC

## 2024-01-14 MED ORDER — VITAMIN D3 25 MCG (1000 UNIT) PO TABS: 5000.0000 [IU] | ORAL_TABLET | ORAL | Status: DC

## 2024-01-14 MED ORDER — LATANOPROST 0.005 % OP SOLN
1.0000 [drp] | Freq: Every day | OPHTHALMIC | Status: DC
  Administered 2024-01-14: 1 [drp] via OPHTHALMIC
  Filled 2024-01-14: qty 2.5

## 2024-01-14 MED ORDER — ATORVASTATIN CALCIUM 20 MG PO TABS
80.0000 mg | ORAL_TABLET | Freq: Every day | ORAL | Status: DC
  Administered 2024-01-14: 80 mg via ORAL
  Filled 2024-01-14: qty 4

## 2024-01-14 MED ORDER — PREDNISONE 5 MG PO TABS
5.0000 mg | ORAL_TABLET | Freq: Every day | ORAL | Status: DC
  Administered 2024-01-14 – 2024-01-15 (×2): 5 mg via ORAL
  Filled 2024-01-14 (×2): qty 1

## 2024-01-14 MED ORDER — METOPROLOL SUCCINATE ER 50 MG PO TB24
50.0000 mg | ORAL_TABLET | Freq: Two times a day (BID) | ORAL | Status: DC
  Administered 2024-01-14: 50 mg via ORAL
  Filled 2024-01-14 (×3): qty 1

## 2024-01-14 MED ORDER — GABAPENTIN 100 MG PO CAPS
300.0000 mg | ORAL_CAPSULE | Freq: Two times a day (BID) | ORAL | Status: DC
  Administered 2024-01-14 – 2024-01-15 (×3): 300 mg via ORAL
  Filled 2024-01-14 (×3): qty 3

## 2024-01-14 MED ORDER — RIVAROXABAN 10 MG PO TABS
20.0000 mg | ORAL_TABLET | Freq: Every day | ORAL | Status: DC
  Administered 2024-01-14 – 2024-01-15 (×2): 20 mg via ORAL
  Filled 2024-01-14 (×2): qty 2

## 2024-01-14 MED ORDER — AMLODIPINE BESYLATE 5 MG PO TABS
5.0000 mg | ORAL_TABLET | Freq: Every day | ORAL | Status: DC
  Administered 2024-01-14: 5 mg via ORAL
  Filled 2024-01-14 (×2): qty 1

## 2024-01-14 MED ORDER — CINNAMON 500 MG PO CAPS: ORAL_CAPSULE | Freq: Every day | ORAL | Status: DC

## 2024-01-14 NOTE — Consult Note (Addendum)
 WOC Nurse ostomy follow up Pt and husband participated in pouch change.  She is familiar with ileostomy pouch application and routines.  Stoma is red and viable, 1 1/4 inches, flush with skin level 50cc liquid brown stool in the pouch.  Pt was able to stretch and apply the barrier ring and apply the pouch using a hand held mirror.  She was able to open and close the velcro to empty.  Reviewed pouching routines and ordering supplies and dietary precautions.  5 sets of each supply left in the room for patient and staff nurses; use supplies: barrier rings Gerlean # [phone] and convex pouch Lawson # [id number]. Pt denies further questions.  Enrolled patient in [employer] Discharge program: Yes, today  Thank-you,  Stephane Fought MSN, RN, [place], [place], CNS [phone]

## 2024-01-14 NOTE — Progress Notes (Signed)
  2 Days Post-Op   Chief Complaint/Subjective: She did not sleep well. Her ostomy is working well and she is eating well and walking well.  Objective: Vital signs in last 24 hours: Temp:  [98.4 F (36.9 C)-99.5 F (37.5 C)] 98.4 F (36.9 C) (07/10 9366) Pulse Rate:  [88-103] 92 (07/10 0633) Resp:  [18] 18 (07/10 0633) BP: (116-131)/(48-56) 116/56 (07/10 0633) SpO2:  [96 %-97 %] 96 % (07/10 9366) Last BM Date : 01/12/24 (from ileostomy) Intake/Output from previous day: 07/09 0701 - 07/10 0700 In: 1621.8 [P.O.:1140; I.V.:481.8] Out: 4585 [Urine:3650; Drains:185; Stool:750]  PE: Gen: NAd Resp: nonlabored Card: RRR Abd: soft, incisions c/d/I, ostomy functional, JP with serous output, catheter in place  Lab Results:  Recent Labs    01/12/24 1543 01/13/24 0532  WBC 22.5* 21.4*  HGB 10.8* 10.6*  HCT 35.2* 33.8*  PLT 453* 401*   Recent Labs    01/12/24 1543 01/13/24 0532  NA  --  136  K  --  3.7  CL  --  105  CO2  --  23  GLUCOSE  --  94  BUN  --  7*  CREATININE 1.30* 0.80  CALCIUM   --  7.9*   No results for input(s): LABPROT, INR in the last 72 hours.    Component Value Date/Time   NA 136 01/13/2024 0532   NA 143 12/07/2019 0751   K 3.7 01/13/2024 0532   CL 105 01/13/2024 0532   CO2 23 01/13/2024 0532   GLUCOSE 94 01/13/2024 0532   BUN 7 (L) 01/13/2024 0532   BUN 15 12/07/2019 0751   CREATININE 0.80 01/13/2024 0532   CREATININE 0.79 12/31/2023 1027   CALCIUM  7.9 (L) 01/13/2024 0532   PROT 6.6 01/01/2024 1110   PROT 6.4 09/07/2020 1311   ALBUMIN  3.3 (L) 01/01/2024 1110   ALBUMIN  3.9 09/07/2020 1311   AST 15 01/01/2024 1110   ALT 18 01/01/2024 1110   ALKPHOS 50 01/01/2024 1110   BILITOT 0.9 01/01/2024 1110   BILITOT <0.2 09/07/2020 1311   GFRNONAA >60 01/13/2024 0532   GFRAA >60 01/03/2020 1357    Assessment/Plan  s/p Procedure(s): COLECTOMY, SIGMOID, ROBOT-ASSISTED LIGATION, INTERNAL FISTULA TRACT CYSTOSCOPY WITH INDOCYANINE GREEN  IMAGING  (ICG) LYSIS, ADHESIONS, ROBOT-ASSISTED, LAPAROSCOPIC INCISION AND DRAINAGE, INTRA-ABDOMINAL ABSCESS CREATION, ILEOSTOMY, LOOP 01/12/2024  -plan for bladder contrast study tomorrow  FEN - reg diet VTE - restart eliquis ID - no issues Disposition - restart home meds   LOS: 2 days   I reviewed last 24 h vitals and pain scores, last 48 h intake and output, last 24 h labs and trends, and last 24 h imaging results.  This care required moderate level of medical decision making.   [name] [facility] Surgery at [facility] 01/14/2024, 9:56 AM Please see Amion for pager number during day hours 7:00am-4:30pm or 7:00am -11:30am on weekends

## 2024-01-14 NOTE — Plan of Care (Signed)
  Problem: Education: Goal: Understanding of discharge needs will improve Outcome: Progressing Goal: Verbalization of understanding of the causes of altered bowel function will improve Outcome: Progressing   Problem: Clinical Measurements: Goal: Postoperative complications will be avoided or minimized Outcome: Progressing   Problem: Skin Integrity: Goal: Will show signs of wound healing Outcome: Progressing   Problem: Education: Goal: Knowledge of General Education information will improve Description: Including pain rating scale, medication(s)/side effects and non-pharmacologic comfort measures Outcome: Progressing   Problem: Health Behavior/Discharge Planning: Goal: Ability to manage health-related needs will improve Outcome: Progressing   Problem: Activity: Goal: Ability to tolerate increased activity will improve Outcome: Adequate for Discharge   Problem: Bowel/Gastric: Goal: Gastrointestinal status for postoperative course will improve Outcome: Adequate for Discharge   Problem: Health Behavior/Discharge Planning: Goal: Identification of community resources to assist with postoperative recovery needs will improve Outcome: Adequate for Discharge   Problem: Nutritional: Goal: Will attain and maintain optimal nutritional status will improve Outcome: Adequate for Discharge   Problem: Respiratory: Goal: Respiratory status will improve Outcome: Adequate for Discharge

## 2024-01-15 ENCOUNTER — Inpatient Hospital Stay (HOSPITAL_COMMUNITY)

## 2024-01-15 MED ORDER — IOHEXOL 300 MG/ML  SOLN
100.0000 mL | Freq: Once | INTRAMUSCULAR | Status: AC | PRN
  Administered 2024-01-15: 100 mL via INTRAVENOUS

## 2024-01-15 MED ORDER — OXYCODONE HCL 5 MG PO TABS: 5.0000 mg | ORAL_TABLET | Freq: Four times a day (QID) | ORAL | 0 refills | Status: DC | PRN

## 2024-01-15 NOTE — TOC Progression Note (Signed)
 Transition of Care [employer]) - Progression Note    Patient Details  Name: Olivia [name] MRN: [id number] Date of Birth: [birth year]  [facility]) CM/SW Contact  Alfonse JONELLE Rex, RN Phone Number: 01/15/2024, 3:57 PM  Clinical Narrative:   ostomy functional, JP with serous output, catheter in place , restart home meds, regular diet. TOC will continue to follow.          Expected Discharge Plan and Services         Expected Discharge Date: 01/15/24                                     Social Determinants of Health (SDOH) Interventions SDOH Screenings   Food Insecurity: No Food Insecurity (01/12/2024)  Housing: Low Risk  (01/12/2024)  Transportation Needs: No Transportation Needs (01/12/2024)  Utilities: Not At Risk (01/12/2024)  Depression (PHQ2-9): Low Risk  (12/31/2023)  Social Connections: Socially Integrated (01/12/2024)  Tobacco Use: Medium Risk (01/12/2024)    Readmission Risk Interventions    01/13/2024    2:33 PM 11/11/2023    1:06 PM  Readmission Risk Prevention Plan  Post Dischage Appt Complete Complete  Medication Screening Complete Complete  Transportation Screening Complete Complete

## 2024-01-15 NOTE — Discharge Summary (Signed)
 Physician Discharge Summary  Olivia [name] FMW:[id number] DOB: 1956/03/07 DOA: 01/12/2024  PCP: Elvis Ditch, PA-C  Admit date: 01/12/2024 Discharge date: 01/15/2024 01/26/2024   Recommendations for Outpatient Follow-up:   (include homehealth, outpatient follow-up instructions, specific recommendations for PCP to follow-up on, etc.)   Discharge Diagnoses:  Principal Problem:   Colovesical fistula   Surgical Procedure: robotic sigmoid resection, take down of colovesical fistula with bladder repair, creation of ileostomy, lysis of adhesions  Discharge Condition: Good Disposition: Home  Diet recommendation: reg diet   Hospital Course:  68 yo female underwent robotic sigmoid resection. Post op she was admitted to the surgical floor. She had ostomy output POD 0 and was advanced on diet. POD 3 she underwent CT cysto which showed no leak and was discharged home POD 3.  Discharge Instructions  Discharge Instructions     Ambulatory Referral for Lung Cancer Scre   Complete by: As directed    Diet - low sodium heart healthy   Complete by: As directed    Discharge wound care:   Complete by: As directed    Shower normal tomorrow. Glue to stay on for 10-14 days. No bandage needed.   Increase activity slowly   Complete by: As directed       Allergies as of 01/15/2024       Reactions   Codeine    Headache-draws line across forehead        Medication List     TAKE these medications    acidophilus Caps capsule Take 1 capsule by mouth daily.   amiodarone  200 MG tablet Commonly known as: PACERONE  Take 200 mg by mouth at bedtime. What changed: how much to take   amLODipine  5 MG tablet Commonly known as: NORVASC  Take 5 mg by mouth daily.   atorvastatin  80 MG tablet Commonly known as: LIPITOR Take 80 mg by mouth at bedtime.   calcium -vitamin D 500-5 MG-MCG tablet Commonly known as: OSCAL WITH D Take 1 tablet by mouth 2 (two) times daily.   cetirizine 10 MG  tablet Commonly known as: ZYRTEC Take 10 mg by mouth daily.   CINNAMON  PO Take 1 tablet by mouth daily.   ergocalciferol 1.25 MG (50000 UT) capsule Commonly known as: VITAMIN D2 Take 50,000 Units by mouth every Wednesday.   fluticasone  50 MCG/ACT nasal spray Commonly known as: FLONASE  Place 1 spray into both nostrils daily as needed for allergies or rhinitis.   gabapentin  300 MG capsule Commonly known as: NEURONTIN  Take 300 mg by mouth 2 (two) times daily.   glucosamine-chondroitin 500-400 MG tablet Take 1 tablet by mouth daily.   latanoprost  0.005 % ophthalmic solution Commonly known as: XALATAN  Place 1 drop into both eyes at bedtime.   MAGNESIUM  GUMMIES PO Take 2 tablets by mouth daily.   metoprolol  succinate 50 MG 24 hr tablet Commonly known as: TOPROL -XL Take 50 mg by mouth 2 (two) times daily.   oxyCODONE  5 MG immediate release tablet Commonly known as: Oxy IR/ROXICODONE  Take 1 tablet (5 mg total) by mouth every 6 (six) hours as needed for severe pain (pain score 7-10).   predniSONE  5 MG tablet Commonly known as: DELTASONE  Take 5 mg by mouth daily.   sodium chloride  flush 0.9 % Soln Commonly known as: NS 10 mLs by Intracatheter route as needed (flush).   VITAMIN B-12 IJ Inject 1,000 mcg into the muscle once a week. Wednesday   VITAMIN B 12 PO Place 3,000 mcg under the tongue daily.   Vitamin D3  1.25 MG (50000 UT) Caps Take 50,000 Units by mouth every Wednesday.   Xarelto  20 MG Tabs tablet Generic drug: rivaroxaban  Take 20 mg by mouth daily.               Discharge Care Instructions  (From admission, onward)           Start     Ordered   01/15/24 0000  Discharge wound care:       Comments: Shower normal tomorrow. Glue to stay on for 10-14 days. No bandage needed.   01/15/24 1126              The results of significant diagnostics from this hospitalization (including imaging, microbiology, ancillary and laboratory) are listed  below for reference.    Significant Diagnostic Studies: CT ABDOMEN PELVIS W CONTRAST Result Date: 01/22/2024 CLINICAL DATA:  Abdominal pain, post-op EXAM: CT ABDOMEN AND PELVIS WITH CONTRAST TECHNIQUE: Multidetector CT imaging of the abdomen and pelvis was performed using the standard protocol following bolus administration of intravenous contrast. RADIATION DOSE REDUCTION: This exam was performed according to the departmental dose-optimization program which includes automated exposure control, adjustment of the mA and/or kV according to patient size and/or use of iterative reconstruction technique. CONTRAST:  OMNIPAQUE  IOHEXOL  300 MG/ML  SOLN COMPARISON:  CT abdomen pelvis 11/25/2023, CT abdomen pelvis 05/26/2020, CT cystogram 01/15/2024 FINDINGS: Lower chest: Right lower lobe pulmonary micronodule (2:11, 7:97). Hepatobiliary: No focal liver abnormality. Status post cholecystectomy. No biliary dilatation. Pneumobilia again noted. Pancreas: No focal lesion. Normal pancreatic contour. No surrounding inflammatory changes. No main pancreatic ductal dilatation. Spleen: Normal in size without focal abnormality. Adrenals/Urinary Tract: No adrenal nodule bilaterally. Bilateral kidneys enhance symmetrically. Subcentimeter hypodensities are too small to characterize-no further follow-up indicated. No hydronephrosis. No hydroureter. The urinary bladder is unremarkable. On delayed imaging, there is no urothelial wall thickening and there are no filling defects in the opacified portions of the bilateral collecting systems or ureters. Stomach/Bowel: Similar-appearing sutures along a rectosigmoid bowel resection. Right lower quadrant diverting ileostomy formation. Stomach is within normal limits. Colonic diverticulosis. No evidence of bowel wall thickening or dilatation. Filled appendix measuring up to 9 mm with associated appendicoliths. No associated right lower quadrant inflammatory changes to suggest acute  appendicitis. Vascular/Lymphatic: No abdominal aorta or iliac aneurysm. Severe atherosclerotic plaque of the aorta and its branches. No abdominal, pelvic, or inguinal lymphadenopathy. Reproductive: Status post hysterectomy. No adnexal masses. Other: Interval removal of a pelvic surgical drain. Interval development of an organized fluid and gas collection along the peritoneum measuring 6.2 x1.5 cm (2:71; 87-9:94) that appears to be continuous with a 8.2 x 2 cm gas and fluid collection along the deep subcutaneus soft tissues of the lower abdomen/pelvis. Findings may be continuous with a tiny foci of free fluid within the space of Retzius (extraperitoneal) anterior to the urinary bladder lumen (2:75). Superior to this there is a abdominal hernia repair with mesh. No intraperitoneal free fluid. No intraperitoneal free gas. Persistent mild fat stranding along the pelvic surgical bed likely postsurgical in etiology. Musculoskeletal: Abdominal hernia repair with mesh. Diastasis rectus. No recurrent hernia. No suspicious lytic or blastic osseous lesions. No acute displaced fracture. IMPRESSION: 1. Interval development of an abscess along the peritoneum measuring 6.2 x1.5 cm that appears to be continuous with a 8.2 x 2 cm gas and fluid collection along the deep subcutaneus soft tissues of the lower abdomen/pelvis. Findings may be continuous with a tiny foci of free fluid within the space of Retzius (  extraperitoneal) anterior to the urinary bladder lumen. 2. Colonic diverticulosis with no acute diverticulitis. Persistent mild fat stranding along the pelvic rectosigmoid resection surgical bed likely postsurgical in etiology. 3. Fluid-filled appendix measuring up to 9 mm with associated appendicoliths. No associated right lower quadrant inflammatory changes to suggest acute appendicitis. Underlying mucocele not fully excluded. Recommend outpatient surgical consultation. 4. Less than 6 mm right solid pulmonary nodule. Per  Fleischner Society Guidelines, no routine follow-up imaging is recommended. These guidelines do not apply to immunocompromised patients and patients with cancer. Follow up in patients with significant comorbidities as clinically warranted. For lung cancer screening, adhere to Lung-RADS guidelines. Reference: Radiology. 2017; 284(1):228-43. Electronically Signed   By: Morgane  Naveau M.D.   On: 01/22/2024 20:21   CT CYSTOGRAM PELVIS Result Date: 01/15/2024 CLINICAL DATA:  Status post colovesical fistula repair EXAM: CT CYSTOGRAM (CT ABDOMEN AND PELVIS WITH CONTRAST) TECHNIQUE: Multi-detector CT imaging through the abdomen and pelvis was performed after dilute contrast had been introduced into the bladder for the purposes of performing CT cystography. RADIATION DOSE REDUCTION: This exam was performed according to the departmental dose-optimization program which includes automated exposure control, adjustment of the mA and/or kV according to patient size and/or use of iterative reconstruction technique. CONTRAST:  OMNIPAQUE  IOHEXOL  300 MG/ML  SOLN COMPARISON:  CT abdomen and pelvis dated 11/25/2023 FINDINGS: Lower chest: Right middle lobe and left lower lobe linear atelectasis no pleural effusion or pneumothorax demonstrated. Partially imaged heart size is normal. Coronary artery calcifications. Hepatobiliary: No focal hepatic lesions. No intra or extrahepatic biliary ductal dilation. Persistent pneumobilia. Decreased small focus of gas within the gallbladder. No extraluminal contrast leak. Pancreas: No focal lesions or main ductal dilation. Spleen: Normal in size without focal abnormality. Adrenals/Urinary Tract: No adrenal nodules. No suspicious renal masses or hydronephrosis. Punctate bilateral nonobstructing stones. Multifocal bilateral subcentimeter hypodensities, too small to characterize. Hyperattenuating contrast material within the urinary bladder, which also contains a few foci of intraluminal gas  with catheter in-situ. Stomach/Bowel: Normal appearance of the stomach. Small gas-filled duodenal diverticulum arising from the third portion of the duodenum. Rectosigmoid anastomosis. Colonic diverticulosis without acute diverticulitis. Right lower quadrant ostomy. A few loops of small bowel appear tethered to the site of prior fluid collection in the lower abdomen/midline pelvis (5:31). Normal appendix. Vascular/Lymphatic: Aortic atherosclerosis. No enlarged abdominal or pelvic lymph nodes. Reproductive: No adnexal masses. Other: Right lateral approach drainage catheter terminates in the right hemipelvis (2:67). No residual fluid collection. Small focus of free air within the anterior midline pelvis (2:73 and along the anterior abdominal wall immediately inferior to the mesh hernia repair (2:68). Lower abdominal and pelvic mesenteric stranding. Small volume presacral free fluid. Musculoskeletal: No acute or abnormal lytic or blastic osseous lesions. Anterior abdominal mesh hernia repair. Small foci of gas along the percutaneous catheter and along the low transverse incision. IMPRESSION: 1. No extraluminal contrast leak. Right lateral approach drainage catheter terminates in the right hemipelvis. No residual fluid collection. 2. A few loops of small bowel appear tethered to the site of prior fluid collection in the lower abdomen/midline pelvis, likely postsurgical. 3. Small focus of free air within the anterior midline pelvis and along the anterior abdominal wall immediately inferior to the mesh hernia repair, likely postsurgical. 4. Persistent pneumobilia. Decreased small focus of gas within the gallbladder. 5. Aortic Atherosclerosis (ICD10-I70.0). Coronary artery calcifications. Assessment for potential risk factor modification, dietary therapy or pharmacologic therapy may be warranted, if clinically indicated. Electronically Signed   By: Limin  Xu M.D.   On: 01/15/2024 16:25    Labs: Basic Metabolic  Panel: Recent Labs  Lab 01/22/24 1835  NA 131*  K 4.1  CL 93*  CO2 25  GLUCOSE 122*  BUN 13  CREATININE 1.09*  CALCIUM  11.2*   Liver Function Tests: Recent Labs  Lab 01/22/24 1835  AST 30  ALT 40  ALKPHOS 73  BILITOT 1.0  PROT 7.2  ALBUMIN  3.3*    CBC: Recent Labs  Lab 01/22/24 1835  WBC 17.5*  NEUTROABS 13.5*  HGB 12.2  HCT 38.5  MCV 90.4  PLT 816*    CBG: No results for input(s): GLUCAP in the last 168 hours.  Principal Problem:   Colovesical fistula   Time coordinating discharge: 15 min

## 2024-01-22 ENCOUNTER — Other Ambulatory Visit: Payer: Self-pay

## 2024-01-22 ENCOUNTER — Emergency Department (HOSPITAL_COMMUNITY)

## 2024-01-22 ENCOUNTER — Emergency Department (HOSPITAL_COMMUNITY)
Admission: EM | Admit: 2024-01-22 | Discharge: 2024-01-22 | Disposition: A | Discharge status: Home / Self Care | Attending: Emergency Medicine | Admitting: Emergency Medicine

## 2024-01-22 DIAGNOSIS — R0981 Nasal congestion: Secondary | ICD-10-CM | POA: Insufficient documentation

## 2024-01-22 DIAGNOSIS — X58XXXA Exposure to other specified factors, initial encounter: Secondary | ICD-10-CM | POA: Insufficient documentation

## 2024-01-22 DIAGNOSIS — R5383 Other fatigue: Secondary | ICD-10-CM | POA: Insufficient documentation

## 2024-01-22 DIAGNOSIS — Z932 Ileostomy status: Secondary | ICD-10-CM | POA: Insufficient documentation

## 2024-01-22 DIAGNOSIS — S301XXA Contusion of abdominal wall, initial encounter: Secondary | ICD-10-CM | POA: Diagnosis not present

## 2024-01-22 DIAGNOSIS — S3991XA Unspecified injury of abdomen, initial encounter: Secondary | ICD-10-CM | POA: Diagnosis present

## 2024-01-22 DIAGNOSIS — R531 Weakness: Secondary | ICD-10-CM | POA: Diagnosis not present

## 2024-01-22 DIAGNOSIS — Z79899 Other long term (current) drug therapy: Secondary | ICD-10-CM | POA: Diagnosis not present

## 2024-01-22 LAB — CBC WITH DIFFERENTIAL/PLATELET
Abs Immature Granulocytes: 0.35 K/uL — ABNORMAL HIGH (ref 0.00–0.07)
Basophils Absolute: 0.1 K/uL (ref 0.0–0.1)
Basophils Relative: 0 %
Eosinophils Absolute: 0 K/uL (ref 0.0–0.5)
Eosinophils Relative: 0 %
HCT: 38.5 % (ref 36.0–46.0)
Hemoglobin: 12.2 g/dL (ref 12.0–15.0)
Immature Granulocytes: 2 %
Lymphocytes Relative: 15 %
Lymphs Abs: 2.6 K/uL (ref 0.7–4.0)
MCH: 28.6 pg (ref 26.0–34.0)
MCHC: 31.7 g/dL (ref 30.0–36.0)
MCV: 90.4 fL (ref 80.0–100.0)
Monocytes Absolute: 1 K/uL (ref 0.1–1.0)
Monocytes Relative: 6 %
Neutro Abs: 13.5 K/uL — ABNORMAL HIGH (ref 1.7–7.7)
Neutrophils Relative %: 77 %
Platelets: 816 K/uL — ABNORMAL HIGH (ref 150–400)
RBC: 4.26 MIL/uL (ref 3.87–5.11)
RDW: 14.4 % (ref 11.5–15.5)
WBC: 17.5 K/uL — ABNORMAL HIGH (ref 4.0–10.5)
nRBC: 0 % (ref 0.0–0.2)

## 2024-01-22 LAB — URINALYSIS, W/ REFLEX TO CULTURE (INFECTION SUSPECTED)
Bacteria, UA: NONE SEEN
Bilirubin Urine: NEGATIVE
Glucose, UA: NEGATIVE mg/dL
Hgb urine dipstick: NEGATIVE
Ketones, ur: NEGATIVE mg/dL
Leukocytes,Ua: NEGATIVE
Nitrite: NEGATIVE
Protein, ur: NEGATIVE mg/dL
Specific Gravity, Urine: 1.019 (ref 1.005–1.030)
pH: 7 (ref 5.0–8.0)

## 2024-01-22 LAB — RESP PANEL BY RT-PCR (RSV, FLU A&B, COVID)  RVPGX2
Influenza A by PCR: NEGATIVE
Influenza B by PCR: NEGATIVE
Resp Syncytial Virus by PCR: NEGATIVE
SARS Coronavirus 2 by RT PCR: NEGATIVE

## 2024-01-22 LAB — COMPREHENSIVE METABOLIC PANEL WITH GFR
ALT: 40 U/L (ref 0–44)
AST: 30 U/L (ref 15–41)
Albumin: 3.3 g/dL — ABNORMAL LOW (ref 3.5–5.0)
Alkaline Phosphatase: 73 U/L (ref 38–126)
Anion gap: 13 (ref 5–15)
BUN: 13 mg/dL (ref 8–23)
CO2: 25 mmol/L (ref 22–32)
Calcium: 11.2 mg/dL — ABNORMAL HIGH (ref 8.9–10.3)
Chloride: 93 mmol/L — ABNORMAL LOW (ref 98–111)
Creatinine, Ser: 1.09 mg/dL — ABNORMAL HIGH (ref 0.44–1.00)
GFR, Estimated: 55 mL/min — ABNORMAL LOW (ref >60)
Glucose, Bld: 122 mg/dL — ABNORMAL HIGH (ref 70–99)
Potassium: 4.1 mmol/L (ref 3.5–5.1)
Sodium: 131 mmol/L — ABNORMAL LOW (ref 135–145)
Total Bilirubin: 1 mg/dL (ref 0.0–1.2)
Total Protein: 7.2 g/dL (ref 6.5–8.1)

## 2024-01-22 MED ORDER — IOHEXOL 300 MG/ML  SOLN
100.0000 mL | Freq: Once | INTRAMUSCULAR | Status: AC | PRN
  Administered 2024-01-22: 100 mL via INTRAVENOUS

## 2024-01-22 MED ORDER — LIDOCAINE HCL 1 % IJ SOLN
INTRAMUSCULAR | Status: AC
  Administered 2024-01-22: 20 mL
  Filled 2024-01-22: qty 20

## 2024-01-22 MED ORDER — SODIUM CHLORIDE 0.9 % IV BOLUS
500.0000 mL | Freq: Once | INTRAVENOUS | Status: AC
  Administered 2024-01-22: 500 mL via INTRAVENOUS

## 2024-01-22 NOTE — Discharge Instructions (Addendum)
 Follow-up with Dr. Stevie as previously scheduled.  Maintain a brat diet.  If your colostomy output is greater than 1200 cc, taken Imodium and contact the office.  Contact the office or return to the emergency room if you have any worsening abdominal pain fevers or other worsening symptoms.

## 2024-01-22 NOTE — ED Triage Notes (Signed)
 Pt arrived POV and states sent by MD for abnormal labs. Reports she has been feeling extremely weak. Denies pain anymore. States elevated WBC, MD sent for CT scan

## 2024-01-22 NOTE — ED Notes (Addendum)
 D/C w/ suction canister to measure colostomy output and gauze to redress aspiration site.

## 2024-01-22 NOTE — ED Provider Notes (Signed)
 [place] EMERGENCY DEPARTMENT AT [facility] Provider Note   CSN: [id number] Arrival date & time: 01/22/24  1658     Patient presents with: Weakness and Fatigue   Olivia [name] is a 68 y.o. female.   Patient is a 68 year old female who presents with fatigue and abdominal pain.  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  She said overall she was doing well and only stayed in the hospital for 3 days.  She said over the last 3 days she has been laying in bed feeling tired.  She denies any known fevers.  She has had a little bit of increased abdominal pain and said it is felt a little hot at times.  She denies any change in her stools.  No vomiting.  No urinary symptoms although she seems her urine has been a little slower than normal.  She also has a little bit of nasal congestion.  No cough or chest congestion.  No shortness of breath.       Prior to Admission medications   Medication Sig Start Date End Date Taking? Authorizing Provider  acidophilus (RISAQUAD) CAPS capsule Take 1 capsule by mouth daily.    [provider]  amiodarone  (PACERONE ) 200 MG tablet Take 200 mg by mouth at bedtime. Patient taking differently: Take 100 mg by mouth at bedtime.    [provider]  amLODipine  (NORVASC ) 5 MG tablet Take 5 mg by mouth daily.    [provider]  atorvastatin  (LIPITOR) 80 MG tablet Take 80 mg by mouth at bedtime.    [provider]  calcium -vitamin D (OSCAL WITH D) 500-5 MG-MCG tablet Take 1 tablet by mouth 2 (two) times daily.    [provider]  cetirizine (ZYRTEC) 10 MG tablet Take 10 mg by mouth daily.    [provider]  Cholecalciferol  (VITAMIN D3) 1.25 MG (50000 UT) CAPS Take 50,000 Units by mouth every Wednesday.    [provider]  CINNAMON  PO Take 1 tablet by mouth daily.    [provider]  Cyanocobalamin  (VITAMIN B 12 PO) Place 3,000 mcg under the tongue  daily.    [provider]  Cyanocobalamin  (VITAMIN B-12 IJ) Inject 1,000 mcg into the muscle once a week. Wednesday    [provider]  ergocalciferol (VITAMIN D2) 1.25 MG (50000 UT) capsule Take 50,000 Units by mouth every Wednesday.    [provider]  fluticasone  (FLONASE ) 50 MCG/ACT nasal spray Place 1 spray into both nostrils daily as needed for allergies or rhinitis.    [provider]  gabapentin  (NEURONTIN ) 300 MG capsule Take 300 mg by mouth 2 (two) times daily.    [provider]  glucosamine-chondroitin 500-400 MG tablet Take 1 tablet by mouth daily.    [provider]  latanoprost  (XALATAN ) 0.005 % ophthalmic solution Place 1 drop into both eyes at bedtime. 12/01/23   [provider]  Magnesium  Citrate (MAGNESIUM  GUMMIES PO) Take 2 tablets by mouth daily.    [provider]  metoprolol  succinate (TOPROL -XL) 50 MG 24 hr tablet Take 50 mg by mouth 2 (two) times daily. 12/12/22   [provider]  oxyCODONE  (OXY IR/ROXICODONE ) 5 MG immediate release tablet Take 1 tablet (5 mg total) by mouth every 6 (six) hours as needed for severe pain (pain score 7-10). 01/15/24   Kinsinger, [name], MD  predniSONE  (DELTASONE ) 5 MG tablet Take 5 mg by mouth daily. 05/12/20  [provider]  sodium chloride  flush (NS) 0.9 % SOLN 10 mLs by Intracatheter route as needed (flush). 11/18/23   Danford, Lonni SQUIBB, MD  XARELTO  20 MG TABS tablet Take 20 mg by mouth daily. 12/12/22   [provider]    Allergies: Codeine    Review of Systems  Constitutional:  Positive for fatigue. Negative for chills, diaphoresis and fever.  HENT:  Positive for congestion. Negative for rhinorrhea and sneezing.   Eyes: Negative.   Respiratory:  Negative for cough, chest tightness and shortness of breath.   Cardiovascular:  Negative for chest pain and leg swelling.  Gastrointestinal:  Positive for abdominal pain and nausea. Negative  for blood in stool, diarrhea and vomiting.  Genitourinary:  Negative for difficulty urinating, flank pain, frequency and hematuria.  Musculoskeletal:  Negative for arthralgias and back pain.  Skin:  Negative for rash.  Neurological:  Negative for dizziness, speech difficulty, weakness, numbness and headaches.    Updated Vital Signs BP (!) 132/54 (BP Location: Left Arm)   Pulse 75   Temp 98.2 F (36.8 C) (Oral)   Resp 18   Ht 5' 3 (1.6 m)   Wt 70.3 kg   SpO2 96%   BMI 27.46 kg/m   Physical Exam Constitutional:      Appearance: She is well-developed.  HENT:     Head: Normocephalic and atraumatic.  Eyes:     Pupils: Pupils are equal, round, and reactive to light.  Cardiovascular:     Rate and Rhythm: Normal rate and regular rhythm.     Heart sounds: Normal heart sounds.  Pulmonary:     Effort: Pulmonary effort is normal. No respiratory distress.     Breath sounds: Normal breath sounds. No wheezing or rales.  Chest:     Chest wall: No tenderness.  Abdominal:     General: Bowel sounds are normal.     Palpations: Abdomen is soft.     Tenderness: There is no abdominal tenderness. There is no guarding or rebound.     Comments: Colostomy in place, healing surgical incisions, she has some mild generalized tenderness.  Musculoskeletal:        General: Normal range of motion.     Cervical back: Normal range of motion and neck supple.  Lymphadenopathy:     Cervical: No cervical adenopathy.  Skin:    General: Skin is warm and dry.     Findings: No rash.  Neurological:     Mental Status: She is alert and oriented to person, place, and time.     (all labs ordered are listed, but only abnormal results are displayed) Labs Reviewed  COMPREHENSIVE METABOLIC PANEL WITH GFR - Abnormal; Notable for the following components:      Result Value   Sodium 131 (*)    Chloride 93 (*)    Glucose, Bld 122 (*)    Creatinine, Ser 1.09 (*)    Calcium  11.2 (*)    Albumin  3.3 (*)    GFR,  Estimated 55 (*)    All other components within normal limits  CBC WITH DIFFERENTIAL/PLATELET - Abnormal; Notable for the following components:   WBC 17.5 (*)    Platelets 816 (*)    Neutro Abs 13.5 (*)    Abs Immature Granulocytes 0.35 (*)    All other components within normal limits  URINALYSIS, W/ REFLEX TO CULTURE (INFECTION SUSPECTED) - Abnormal; Notable for the following components:   Color, Urine STRAW (*)    All other  components within normal limits  RESP PANEL BY RT-PCR (RSV, FLU A&B, COVID)  [id number]  AEROBIC/ANAEROBIC CULTURE W GRAM STAIN (SURGICAL/DEEP WOUND)    EKG: None  Radiology: CT ABDOMEN PELVIS W CONTRAST Result Date: 01/22/2024 CLINICAL DATA:  Abdominal pain, post-op EXAM: CT ABDOMEN AND PELVIS WITH CONTRAST TECHNIQUE: Multidetector CT imaging of the abdomen and pelvis was performed using the standard protocol following bolus administration of intravenous contrast. RADIATION DOSE REDUCTION: This exam was performed according to the departmental dose-optimization program which includes automated exposure control, adjustment of the mA and/or kV according to patient size and/or use of iterative reconstruction technique. CONTRAST:  OMNIPAQUE  IOHEXOL  300 MG/ML  SOLN COMPARISON:  CT abdomen pelvis 11/25/2023, CT abdomen pelvis 05/26/2020, CT cystogram 01/15/2024 FINDINGS: Lower chest: Right lower lobe pulmonary micronodule (2:11, 7:97). Hepatobiliary: No focal liver abnormality. Status post cholecystectomy. No biliary dilatation. Pneumobilia again noted. Pancreas: No focal lesion. Normal pancreatic contour. No surrounding inflammatory changes. No main pancreatic ductal dilatation. Spleen: Normal in size without focal abnormality. Adrenals/Urinary Tract: No adrenal nodule bilaterally. Bilateral kidneys enhance symmetrically. Subcentimeter hypodensities are too small to characterize-no further follow-up indicated. No hydronephrosis. No hydroureter. The urinary bladder is  unremarkable. On delayed imaging, there is no urothelial wall thickening and there are no filling defects in the opacified portions of the bilateral collecting systems or ureters. Stomach/Bowel: Similar-appearing sutures along a rectosigmoid bowel resection. Right lower quadrant diverting ileostomy formation. Stomach is within normal limits. Colonic diverticulosis. No evidence of bowel wall thickening or dilatation. Filled appendix measuring up to 9 mm with associated appendicoliths. No associated right lower quadrant inflammatory changes to suggest acute appendicitis. Vascular/Lymphatic: No abdominal aorta or iliac aneurysm. Severe atherosclerotic plaque of the aorta and its branches. No abdominal, pelvic, or inguinal lymphadenopathy. Reproductive: Status post hysterectomy. No adnexal masses. Other: Interval removal of a pelvic surgical drain. Interval development of an organized fluid and gas collection along the peritoneum measuring 6.2 x1.5 cm (2:71; 87-9:94) that appears to be continuous with a 8.2 x 2 cm gas and fluid collection along the deep subcutaneus soft tissues of the lower abdomen/pelvis. Findings may be continuous with a tiny foci of free fluid within the space of Retzius (extraperitoneal) anterior to the urinary bladder lumen (2:75). Superior to this there is a abdominal hernia repair with mesh. No intraperitoneal free fluid. No intraperitoneal free gas. Persistent mild fat stranding along the pelvic surgical bed likely postsurgical in etiology. Musculoskeletal: Abdominal hernia repair with mesh. Diastasis rectus. No recurrent hernia. No suspicious lytic or blastic osseous lesions. No acute displaced fracture. IMPRESSION: 1. Interval development of an abscess along the peritoneum measuring 6.2 x1.5 cm that appears to be continuous with a 8.2 x 2 cm gas and fluid collection along the deep subcutaneus soft tissues of the lower abdomen/pelvis. Findings may be continuous with a tiny foci of free fluid  within the space of Retzius (extraperitoneal) anterior to the urinary bladder lumen. 2. Colonic diverticulosis with no acute diverticulitis. Persistent mild fat stranding along the pelvic rectosigmoid resection surgical bed likely postsurgical in etiology. 3. Fluid-filled appendix measuring up to 9 mm with associated appendicoliths. No associated right lower quadrant inflammatory changes to suggest acute appendicitis. Underlying mucocele not fully excluded. Recommend outpatient surgical consultation. 4. Less than 6 mm right solid pulmonary nodule. Per Fleischner Society Guidelines, no routine follow-up imaging is recommended. These guidelines do not apply to immunocompromised patients and patients with cancer. Follow up in patients with significant comorbidities as clinically warranted. For lung cancer screening,  adhere to Lung-RADS guidelines. Reference: Radiology. 2017; 284(1):228-43. Electronically Signed   By: Morgane  Naveau M.D.   On: 01/22/2024 20:21     Procedures   Medications Ordered in the ED  iohexol  (OMNIPAQUE ) 300 MG/ML solution 100 mL (100 mLs Intravenous Contrast Given 01/22/24 1950)  sodium chloride  0.9 % bolus 500 mL (500 mLs Intravenous New Bag/Given 01/22/24 2052)  lidocaine  (XYLOCAINE ) 1 % (with pres) injection (20 mLs  Given by Other 01/22/24 2120)                                    Medical Decision Making Amount and/or Complexity of Data Reviewed Labs: ordered. Radiology: ordered.  Risk Prescription drug management.   This patient presents to the ED for concern of fatigue, abd pain this involves an extensive number of treatment options, and is a complaint that carries with it a high risk of complications and morbidity.  I considered the following differential and admission for this acute, potentially life threatening condition.  The differential diagnosis includes bowel obstruction, colitis, UTI, postsurgical abscess, anemia  MDM:    Patient is a 68 year old who  presents with general fatigue and a little bit of abdominal pain.  She did not really complain of much abdominal pain but she was a little tender on exam.  She is afebrile.  Her labs show an elevated WBC count but is actually better from when she was recently in the hospital.  CT scan shows a possible abscess in the abdominal wall.  Dr. Tanda with surgery has evaluated the patient and done a bedside needle aspiration.  It looks to be more like a seroma.  He did send it for culture but at this point does not feel like the patient needs to be started on antibiotics.  She was given a little bit of IV fluids.  She is afebrile and otherwise well-appearing.  She was discharged home in good condition.  Will follow-up with the surgeon as previously scheduled.  She was also advised to call the office if she has any fevers or changes.  Return to the emergency room for any worsening symptoms.  (Labs, imaging, consults)  Labs: I Ordered, and personally interpreted labs.  The pertinent results include: Elevated WBC count although improved from her recent hospitalization, mild elevation in her creatinine  Imaging Studies ordered: I ordered imaging studies including CT abdomen pelvis I independently visualized and interpreted imaging. I agree with the radiologist interpretation  Additional history obtained from chart.  External records from outside source obtained and reviewed including IR hospitalization notes  Cardiac Monitoring: The patient was maintained on a cardiac monitor.  If on the cardiac monitor, I personally viewed and interpreted the cardiac monitored which showed an underlying rhythm of: Sinus rhythm  Reevaluation: After the interventions noted above, I reevaluated the patient and found that they have :improved  Social Determinants of Health:    Disposition:     Co morbidities that complicate the patient evaluation  Past Medical History:  Diagnosis Date   Anemia    Anxiety     Diverticulitis    Dyslipidemia    Dysrhythmia    A.fib   GERD (gastroesophageal reflux disease)    Hypertension    PAD (peripheral artery disease) (HCC)    PONV (postoperative nausea and vomiting)    Psoriasis    RA (rheumatoid arthritis) (HCC)    Thrombocytosis    Vertigo  Medicines Meds ordered this encounter  Medications   iohexol  (OMNIPAQUE ) 300 MG/ML solution 100 mL   sodium chloride  0.9 % bolus 500 mL   lidocaine  (XYLOCAINE ) 1 % (with pres) injection    Diggs, Myra J: cabinet override    I have reviewed the patients home medicines and have made adjustments as needed  Problem List / ED Course: Problem List Items Addressed This Visit   None Visit Diagnoses       Other fatigue    -  Primary     Abdominal wall seroma, initial encounter                    Final diagnoses:  Other fatigue  Abdominal wall seroma, initial encounter    ED Discharge Orders     None          Lenor Hollering, MD 01/22/24 2149

## 2024-01-22 NOTE — ED Notes (Signed)
 Pt refused respiratory panel swab, stated she had one today and it was negative.

## 2024-01-22 NOTE — Consult Note (Signed)
 CC: fatigue, excessive sleepiness  Requesting provider: dr lenor  HPI: Olivia [name] is an 68 y.o. female who is here for for evaluation because of fatigue and excessive sleepiness.  Patient was in the hospital from July 8 through the 11th for planned management of her chronic diverticulitis with colovesicular fistula.  She underwent robotic sigmoid colectomy, lysis of adhesions for 60 minutes, drainage of intra-abdominal abscess, takedown of colovesicular fistula and creation of loop ileostomy with repair of bladder on July 8.  She was discharged on the 11th.  She is accompanied by her husband.  She states that she has been taking her vital signs daily and has had no fever.  She has been emptying her ostomy bag 3-4 times a day.  It is all liquid.  No dysuria.  No hesitancy or sensation of incomplete voiding.  No nausea or vomiting.  No abdominal pain.  She has been drinking several bottles of water  a day in addition to a protein shake, yogurt and eating some soft foods although she has been sticking more toward liquids.  She really has not been measuring her ostomy output.  She states that she came to the ER because she is just had some fatigue and excessive sleepiness then more so than usual.  She was recently put on gabapentin  600 mg for her hand tremor.  It is prescribed twice a day but she has only been taking it once a day.  Past Medical History:  Diagnosis Date   Anemia    Anxiety    Diverticulitis    Dyslipidemia    Dysrhythmia    A.fib   GERD (gastroesophageal reflux disease)    Hypertension    PAD (peripheral artery disease) (HCC)    PONV (postoperative nausea and vomiting)    Psoriasis    RA (rheumatoid arthritis) (HCC)    Thrombocytosis    Vertigo     Past Surgical History:  Procedure Laterality Date   ANEURYSM COILING Right    right lower leg   BILIARY BRUSHING  05/16/2020   Procedure: BILIARY BRUSHING;  Surgeon: Golda Claudis PENNER, MD;  Location: [facility];  Service:  Endoscopy;;   BILIARY BRUSHING  07/26/2020   Procedure: BILIARY BRUSHING;  Surgeon: Teressa Toribio SQUIBB, MD;  [facility]: [facility];  Service: Endoscopy;;   BILIARY STENT PLACEMENT  01/05/2020   Procedure: BILIARY STENT PLACEMENT;  Surgeon: Golda Claudis PENNER, MD;  [facility];  Service: Endoscopy;;   BILIARY STENT PLACEMENT  05/16/2020   Procedure: BILIARY STENT PLACEMENT;  Surgeon: Golda Claudis PENNER, MD;  Location: [facility];  Service: Endoscopy;;   CHOLECYSTECTOMY  2005   done during bowel surgery   COLECTOMY, SIGMOID, ROBOT-ASSISTED N/A 01/12/2024   Procedure: COLECTOMY, SIGMOID, ROBOT-ASSISTED;  Surgeon: Stevie [name], MD;  Location: [facility];  Service: General;  Laterality: N/A;  ROBOTIC SIGMOID RESECTION WITH ANASTOMOSIS   COLONOSCOPY  03/07/2015   Propofol ; Surgeon: Dr. Petel; Three 6 mm rectosigmoid polyps, moderate left-sided diverticulosis, tortuous rectosigmoid colon. Pathology with inflamed hyperplastic polyp.   COLONOSCOPY WITH PROPOFOL  N/A 08/18/2019   non-bleeding internal hemorrhoids, pancolonic diverticulosis, two 4-7 mm polyps in rectum benign, no adenomas. Next colonoscopy in 10 years.    CYSTOSCOPY WITH INDOCYANINE GREEN  IMAGING (ICG) N/A 01/12/2024   Procedure: CYSTOSCOPY WITH INDOCYANINE GREEN  IMAGING (ICG);  Surgeon: Elisabeth Valli BIRCH, MD;  [facility]: [facility];  Service: Urology;  Laterality: N/A;  CYSTO WITH FIREFLY   ENDOSCOPIC RETROGRADE CHOLANGIOPANCREATOGRAPHY (ERCP) WITH PROPOFOL  N/A 07/26/2020   Procedure:  ENDOSCOPIC RETROGRADE CHOLANGIOPANCREATOGRAPHY (ERCP) WITH PROPOFOL ;  Surgeon: Teressa Toribio SQUIBB, MD;  [facility];  Service: Endoscopy;  Laterality: N/A;   ERCP N/A 01/05/2020   Procedure: ENDOSCOPIC RETROGRADE CHOLANGIOPANCREATOGRAPHY (ERCP);  Surgeon: Golda Claudis PENNER, MD;  [facility];  Service: Endoscopy;  Laterality: N/A;   ERCP N/A 05/16/2020   Procedure: ENDOSCOPIC RETROGRADE CHOLANGIOPANCREATOGRAPHY (ERCP);  Surgeon: Golda Claudis PENNER, MD;  [facility]: [facility];  Service: Endoscopy;  Laterality: N/A;   ESOPHAGOGASTRODUODENOSCOPY (EGD) WITH PROPOFOL  N/A 07/26/2020   Procedure: ESOPHAGOGASTRODUODENOSCOPY (EGD) WITH PROPOFOL ;  Surgeon: Teressa Toribio SQUIBB, MD;  [facility];  Service: Endoscopy;  Laterality: N/A;   GASTROINTESTINAL STENT REMOVAL N/A 05/16/2020   Procedure: STENT REMOVAL;  Surgeon: Golda Claudis PENNER, MD;  Location: [facility];  Service: Endoscopy;  Laterality: N/A;   HYSTERECTOMY ABDOMINAL WITH SALPINGO-OOPHORECTOMY     ILEO LOOP DIVERSION Right 01/12/2024   Procedure: CREATION, ILEOSTOMY, LOOP;  Surgeon: Kinsinger, [name], MD;  Location: [facility];  Service: General;  Laterality: Right;  CREATION LOOP ILEOSTOMY   ILEOSTOMY  2005   per patient for bowel obstruction and peritonitis with anastomosis   INCISION AND DRAINAGE ABSCESS N/A 01/12/2024   Procedure: INCISION AND DRAINAGE, INTRA-ABDOMINAL ABSCESS;  Surgeon: Kinsinger, [name], MD;  [facility]: [facility];  Service: General;  Laterality: N/A;  DRAINAGE INTRA-ABDOMINAL ABCESS   INSERTION OF MESH     RLQ   IR RADIOLOGIST EVAL & MGMT  11/25/2023   LIGATION OF INTERNAL FISTULA TRACT N/A 01/12/2024   Procedure: LIGATION, INTERNAL FISTULA TRACT;  Surgeon: Stevie, [name], MD;  [facility];  Service: General;  Laterality: N/A;  TAKEDOWN OF COLOVESICAL FISTULA   POLYPECTOMY  08/18/2019   Procedure: POLYPECTOMY;  Surgeon: Shaaron Lamar HERO, MD;  [facility];  Service: Endoscopy;;   REMOVAL OF STONES N/A 01/05/2020   Procedure: REMOVAL OF STONES;  Surgeon: Golda Claudis PENNER, MD;  [facility];  Service: Endoscopy;  Laterality: N/A;   ROBOTIC ASSISTED LAPAROSCOPIC LYSIS OF ADHESION N/A 01/12/2024   Procedure: LYSIS, ADHESIONS, ROBOT-ASSISTED, LAPAROSCOPIC;  Surgeon: Kinsinger, [name], MD;  Location[facility];  Service: General;  Laterality: N/A;  LYSIS OF ADHESIONS, ABDOMEN   SPHINCTEROTOMY N/A 01/05/2020   Procedure: SPHINCTEROTOMY;   Surgeon: Golda Claudis PENNER, MD;  [facility];  Service: Endoscopy;  Laterality: N/A;   SPHINCTEROTOMY N/A 05/16/2020   Procedure: SPHINCTEROTOMY;  Surgeon: Golda Claudis PENNER, MD;  Location: [facility];  Service: Endoscopy;  Laterality: N/A;   SPYGLASS CHOLANGIOSCOPY N/A 07/26/2020   Procedure: DEBHOJDD CHOLANGIOSCOPY;  Surgeon: Teressa Toribio SQUIBB, MD;  [facility];  Service: Endoscopy;  Laterality: N/A;   STENT REMOVAL  07/26/2020   Procedure: STENT REMOVAL;  Surgeon: Teressa Toribio SQUIBB, MD;  [facility];  Service: Endoscopy;;   UPPER ESOPHAGEAL ENDOSCOPIC ULTRASOUND (EUS) N/A 07/26/2020   Procedure: UPPER ESOPHAGEAL ENDOSCOPIC ULTRASOUND (EUS);  Surgeon: Teressa Toribio SQUIBB, MD;  Location: [facility];  Service: Endoscopy;  Laterality: N/A;    Family History  Problem Relation Age of Onset   Lung cancer Mother    Lung cancer Father    Stomach cancer Brother    Breast cancer Cousin    Colon cancer Neg Hx     Social:  reports that she quit smoking about 5 years ago. Her smoking use included cigarettes. She started smoking about 55 years ago. She has a 25 pack-year smoking history. She has never used smokeless tobacco. She reports current alcohol  use. She reports that she does not use drugs.  Allergies:  Allergies  Allergen Reactions   Codeine     Headache-draws line across forehead    Medications: I have reviewed the patient's current medications.   ROS - all of the below systems have been reviewed with the patient and positives are indicated with bold text General: chills, fever or night sweats Eyes: blurry vision or double vision ENT: epistaxis or sore throat Allergy/Immunology: itchy/watery eyes or nasal congestion Hematologic/Lymphatic: bleeding problems, blood clots or swollen lymph nodes Endocrine: temperature intolerance or unexpected weight changes Breast: new or changing breast lumps or nipple discharge Resp: cough, shortness of breath, or  wheezing CV: chest pain or dyspnea on exertion GI: as per HPI GU: dysuria, trouble voiding, or hematuria MSK: joint pain or joint stiffness Neuro: TIA or stroke symptoms Derm: pruritus and skin lesion changes Psych: anxiety and depression  PE Blood pressure (!) 132/54, pulse 75, temperature 98.2 F (36.8 C), temperature source Oral, resp. rate 18, height 5' 3 (1.6 m), weight 70.3 kg, SpO2 96%. Constitutional: NAD; conversant; no deformities; completely not ill-appearing Eyes: Moist conjunctiva; no lid lag; anicteric; PERRL Neck: Trachea midline; no thyromegaly Lungs: Normal respiratory effort; no tactile fremitus CV: RRR; no palpable thrills; no pitting edema GI: Abd soft, nondistended, well-healed incisions including Pfannenstiel incision without cellulitis, induration or fluctuance.  Right lower quadrant loop ileostomy is well-appearing.  Liquid stool in bag.  No sign of incisional hernia; no palpable hepatosplenomegaly MSK:  no clubbing/cyanosis Psychiatric: Appropriate affect; alert and oriented x3 Lymphatic: No palpable cervical or axillary lymphadenopathy Skin: Occlusive bandage on left upper extremity  Results for orders placed or performed during the hospital encounter of 01/22/24 (from the past 48 hours)  Comprehensive metabolic panel     Status: Abnormal   Collection Time: 01/22/24  6:35 PM  Result Value Ref Range   Sodium 131 (L) 135 - 145 mmol/L   Potassium 4.1 3.5 - 5.1 mmol/L   Chloride 93 (L) 98 - 111 mmol/L   CO2 25 22 - 32 mmol/L   Glucose, Bld 122 (H) 70 - 99 mg/dL    Comment: Glucose reference range applies only to samples taken after fasting for at least 8 hours.   BUN 13 8 - 23 mg/dL   Creatinine, Ser 8.90 (H) 0.44 - 1.00 mg/dL   Calcium  11.2 (H) 8.9 - 10.3 mg/dL   Total Protein 7.2 6.5 - 8.1 g/dL   Albumin  3.3 (L) 3.5 - 5.0 g/dL   AST 30 15 - 41 U/L   ALT 40 0 - 44 U/L   Alkaline Phosphatase 73 38 - 126 U/L   Total Bilirubin 1.0 0.0 - 1.2 mg/dL   GFR,  Estimated 55 (L) >60 mL/min    Comment: (NOTE) Calculated using the CKD-EPI Creatinine Equation (2021)    Anion gap 13 5 - 15    Comment: Performed at [facility], [address], [place], [place] [address]  CBC with Differential     Status: Abnormal   Collection Time: 01/22/24  6:35 PM  Result Value Ref Range   WBC 17.5 (H) 4.0 - 10.5 K/uL   RBC 4.26 3.87 - 5.11 MIL/uL   Hemoglobin 12.2 12.0 - 15.0 g/dL   HCT 61.4 63.9 - 53.9 %   MCV 90.4 80.0 - 100.0 fL   MCH 28.6 26.0 - 34.0 pg   MCHC 31.7 30.0 - 36.0 g/dL   RDW 85.5 88.4 - 84.4 %  Platelets 816 (H) 150 - 400 K/uL   nRBC 0.0 0.0 - 0.2 %   Neutrophils Relative % 77 %   Neutro Abs 13.5 (H) 1.7 - 7.7 K/uL   Lymphocytes Relative 15 %   Lymphs Abs 2.6 0.7 - 4.0 K/uL   Monocytes Relative 6 %   Monocytes Absolute 1.0 0.1 - 1.0 K/uL   Eosinophils Relative 0 %   Eosinophils Absolute 0.0 0.0 - 0.5 K/uL   Basophils Relative 0 %   Basophils Absolute 0.1 0.0 - 0.1 K/uL   Immature Granulocytes 2 %   Abs Immature Granulocytes 0.35 (H) 0.00 - 0.07 K/uL    Comment: Performed at [facility], [address] [place], [place] [address]  Resp panel by RT-PCR (RSV, Flu A&B, Covid) Anterior Nasal Swab     Status: None   Collection Time: 01/22/24  7:28 PM   Specimen: Anterior Nasal Swab  Result Value Ref Range   SARS Coronavirus 2 by RT PCR NEGATIVE NEGATIVE    Comment: (NOTE) SARS-CoV-2 target nucleic acids are NOT DETECTED.  The SARS-CoV-2 RNA is generally detectable in upper respiratory specimens during the acute phase of infection. The lowest concentration of SARS-CoV-2 viral copies this assay can detect is 138 copies/mL. A negative result does not preclude SARS-Cov-2 infection and should not be used as the sole basis for treatment or other patient management decisions. A negative result may occur with  improper specimen collection/handling, submission of specimen other than nasopharyngeal swab,  presence of viral mutation(s) within the areas targeted by this assay, and inadequate number of viral copies(<138 copies/mL). A negative result must be combined with clinical observations, patient history, and epidemiological information. The expected result is Negative.  Fact Sheet for Patients:  BloggerCourse.com  Fact Sheet for Healthcare Providers:  SeriousBroker.it  This test is no t yet approved or cleared by the [place]  FDA and  has been authorized for detection and/or diagnosis of SARS-CoV-2 by FDA under an Emergency Use Authorization (EUA). This EUA will remain  in effect (meaning this test can be used) for the duration of the COVID-19 declaration under Section 564(b)(1) of the Act, 21 U.S.C.section 360bbb-3(b)(1), unless the authorization is terminated  or revoked sooner.       Influenza A by PCR NEGATIVE NEGATIVE   Influenza B by PCR NEGATIVE NEGATIVE    Comment: (NOTE) The Xpert Xpress SARS-CoV-2/FLU/RSV plus assay is intended as an aid in the diagnosis of influenza from Nasopharyngeal swab specimens and should not be used as a sole basis for treatment. Nasal washings and aspirates are unacceptable for Xpert Xpress SARS-CoV-2/FLU/RSV testing.  Fact Sheet for Patients: BloggerCourse.com  Fact Sheet for Healthcare Providers: SeriousBroker.it  This test is not yet approved or cleared by the [place]  FDA and has been authorized for detection and/or diagnosis of SARS-CoV-2 by FDA under an Emergency Use Authorization (EUA). This EUA will remain in effect (meaning this test can be used) for the duration of the COVID-19 declaration under Section 564(b)(1) of the Act, 21 U.S.C. section 360bbb-3(b)(1), unless the authorization is terminated or revoked.     Resp Syncytial Virus by PCR NEGATIVE NEGATIVE    Comment: (NOTE) Fact Sheet for  Patients: BloggerCourse.com  Fact Sheet for Healthcare Providers: SeriousBroker.it  This test is not yet approved or cleared by the [place]  FDA and has been authorized for detection and/or diagnosis of SARS-CoV-2 by FDA under an Emergency Use Authorization (EUA). This EUA will remain in effect (meaning this test  can be used) for the duration of the COVID-19 declaration under Section 564(b)(1) of the Act, 21 U.S.C. section 360bbb-3(b)(1), unless the authorization is terminated or revoked.  Performed at [facility], [address], [place], [place] [address]   Urinalysis, w/ Reflex to Culture (Infection Suspected) -Urine, Clean Catch     Status: Abnormal   Collection Time: 01/22/24  8:32 PM  Result Value Ref Range   Specimen Source URINE, CLEAN CATCH    Color, Urine STRAW (A) YELLOW   APPearance CLEAR CLEAR   Specific Gravity, Urine 1.019 1.005 - 1.030   pH 7.0 5.0 - 8.0   Glucose, UA NEGATIVE NEGATIVE mg/dL   Hgb urine dipstick NEGATIVE NEGATIVE   Bilirubin Urine NEGATIVE NEGATIVE   Ketones, ur NEGATIVE NEGATIVE mg/dL   Protein, ur NEGATIVE NEGATIVE mg/dL   Nitrite NEGATIVE NEGATIVE   Leukocytes,Ua NEGATIVE NEGATIVE   RBC / HPF 0-5 0 - 5 RBC/hpf   WBC, UA 0-5 0 - 5 WBC/hpf    Comment:        Reflex urine culture not performed if WBC <=10, OR if Squamous epithelial cells >5. If Squamous epithelial cells >5 suggest recollection.    Bacteria, UA NONE SEEN NONE SEEN   Squamous Epithelial / HPF 0-5 0 - 5 /HPF    Comment: Performed at [facility], [address] [place], [place] [address]    CT ABDOMEN PELVIS W CONTRAST Result Date: 01/22/2024 CLINICAL DATA:  Abdominal pain, post-op EXAM: CT ABDOMEN AND PELVIS WITH CONTRAST TECHNIQUE: Multidetector CT imaging of the abdomen and pelvis was performed using the standard protocol following bolus administration of intravenous contrast.  RADIATION DOSE REDUCTION: This exam was performed according to the departmental dose-optimization program which includes automated exposure control, adjustment of the mA and/or kV according to patient size and/or use of iterative reconstruction technique. CONTRAST:  OMNIPAQUE  IOHEXOL  300 MG/ML  SOLN COMPARISON:  CT abdomen pelvis 11/25/2023, CT abdomen pelvis 05/26/2020, CT cystogram 01/15/2024 FINDINGS: Lower chest: Right lower lobe pulmonary micronodule (2:11, 7:97). Hepatobiliary: No focal liver abnormality. Status post cholecystectomy. No biliary dilatation. Pneumobilia again noted. Pancreas: No focal lesion. Normal pancreatic contour. No surrounding inflammatory changes. No main pancreatic ductal dilatation. Spleen: Normal in size without focal abnormality. Adrenals/Urinary Tract: No adrenal nodule bilaterally. Bilateral kidneys enhance symmetrically. Subcentimeter hypodensities are too small to characterize-no further follow-up indicated. No hydronephrosis. No hydroureter. The urinary bladder is unremarkable. On delayed imaging, there is no urothelial wall thickening and there are no filling defects in the opacified portions of the bilateral collecting systems or ureters. Stomach/Bowel: Similar-appearing sutures along a rectosigmoid bowel resection. Right lower quadrant diverting ileostomy formation. Stomach is within normal limits. Colonic diverticulosis. No evidence of bowel wall thickening or dilatation. Filled appendix measuring up to 9 mm with associated appendicoliths. No associated right lower quadrant inflammatory changes to suggest acute appendicitis. Vascular/Lymphatic: No abdominal aorta or iliac aneurysm. Severe atherosclerotic plaque of the aorta and its branches. No abdominal, pelvic, or inguinal lymphadenopathy. Reproductive: Status post hysterectomy. No adnexal masses. Other: Interval removal of a pelvic surgical drain. Interval development of an organized fluid and gas collection along  the peritoneum measuring 6.2 x1.5 cm (2:71; 87-9:94) that appears to be continuous with a 8.2 x 2 cm gas and fluid collection along the deep subcutaneus soft tissues of the lower abdomen/pelvis. Findings may be continuous with a tiny foci of free fluid within the space of Retzius (extraperitoneal) anterior to the urinary bladder lumen (2:75). Superior  to this there is a abdominal hernia repair with mesh. No intraperitoneal free fluid. No intraperitoneal free gas. Persistent mild fat stranding along the pelvic surgical bed likely postsurgical in etiology. Musculoskeletal: Abdominal hernia repair with mesh. Diastasis rectus. No recurrent hernia. No suspicious lytic or blastic osseous lesions. No acute displaced fracture. IMPRESSION: 1. Interval development of an abscess along the peritoneum measuring 6.2 x1.5 cm that appears to be continuous with a 8.2 x 2 cm gas and fluid collection along the deep subcutaneus soft tissues of the lower abdomen/pelvis. Findings may be continuous with a tiny foci of free fluid within the space of Retzius (extraperitoneal) anterior to the urinary bladder lumen. 2. Colonic diverticulosis with no acute diverticulitis. Persistent mild fat stranding along the pelvic rectosigmoid resection surgical bed likely postsurgical in etiology. 3. Fluid-filled appendix measuring up to 9 mm with associated appendicoliths. No associated right lower quadrant inflammatory changes to suggest acute appendicitis. Underlying mucocele not fully excluded. Recommend outpatient surgical consultation. 4. Less than 6 mm right solid pulmonary nodule. Per Fleischner Society Guidelines, no routine follow-up imaging is recommended. These guidelines do not apply to immunocompromised patients and patients with cancer. Follow up in patients with significant comorbidities as clinically warranted. For lung cancer screening, adhere to Lung-RADS guidelines. Reference: Radiology. 2017; 284(1):228-43. Electronically Signed   By:  Morgane  Naveau M.D.   On: 01/22/2024 20:21    Imaging: Personally reviewed  A/P: Olivia [name] is an 68 y.o. female with  Fatigue and excessive sleepiness Status post robotic sigmoid colectomy with diverting loop ileostomy, lysis of adhesions, drainage of intra-abdominal abscess, takedown of colovesicular fistula and repair of bladder on July 8 by Dr. Patrici Abdominal wall fluid collection Mild elevation of creatinine  The patient denies any abdominal pain.  No fever or chills at home.  Her white count is trending down.  It is lower now than when she was in the hospital.  She had had a persistent leukocytosis because of her chronic diverticulitis.  There is no signs of intra-abdominal abscess per se.  I think the fluid collection is due to a seroma.  I discussed my interpretation of the imaging with the patient and her husband.  We discussed observation versus aspiration at the bedside.  Discussed risk and benefits of aspiration.  After obtaining informed consent the area was prepped with ChloraPrep.  I then infiltrated 3 cc of 1% Xylocaine  a little bit superior to her Pfannenstiel incision.  I was able to easily aspirate about 8 cc of serosanguineous fluid consistent with a seroma.  There was no odor to it.  The fluid was sent for aerobic and anaerobic cultures.  A dry dressing was applied.  There were no complications.  There is no pus in this fluid collection.  She has no fever.  There is no signs of cellulitis on exam.  Her white count is trending down for her.  I think this is just a seroma in her incision.  Do not think she needs to go with antibiotics but we will send off the fluid for culture and analysis.  Discussed with patient monitoring for fever and signs of a wound infection and what to do should that occur Urged patient to discuss the gabapentin  with the prescribing PCP since that may be the contributing to her drowsiness  Patient was given a canister to monitor and measure  her ostomy output Discussed the importance of staying up with her fluid status Was advised that should she have more than 1200  mL out per day then she should probably take an Imodium and contact the office We also discussed brat diet  Discussed with the EDP.  I will give the patient some IV fluids prior to discharge but I think she is safe for discharge home  Camellia HERO. Tanda, MD, FACS General, Bariatric, & Minimally Invasive Surgery [facility]

## 2024-01-27 LAB — AEROBIC/ANAEROBIC CULTURE W GRAM STAIN (SURGICAL/DEEP WOUND): Culture: NO GROWTH

## 2024-01-28 NOTE — Progress Notes (Signed)
 ED Antimicrobial Stewardship Positive Culture Follow Up   Olivia [name] is an 68 y.o. female who presented to [facility] on 01/22/2024 with a chief complaint of  Chief Complaint  Patient presents with   Weakness   Fatigue    Recent Results (from the past 720 hours)  Resp panel by RT-PCR (RSV, Flu A&B, Covid) Anterior Nasal Swab     Status: None   Collection Time: 01/22/24  7:28 PM   Specimen: Anterior Nasal Swab  Result Value Ref Range Status   SARS Coronavirus 2 by RT PCR NEGATIVE NEGATIVE Final    Comment: (NOTE) SARS-CoV-2 target nucleic acids are NOT DETECTED.  The SARS-CoV-2 RNA is generally detectable in upper respiratory specimens during the acute phase of infection. The lowest concentration of SARS-CoV-2 viral copies this assay can detect is 138 copies/mL. A negative result does not preclude SARS-Cov-2 infection and should not be used as the sole basis for treatment or other patient management decisions. A negative result may occur with  improper specimen collection/handling, submission of specimen other than nasopharyngeal swab, presence of viral mutation(s) within the areas targeted by this assay, and inadequate number of viral copies(<138 copies/mL). A negative result must be combined with clinical observations, patient history, and epidemiological information. The expected result is Negative.  Fact Sheet for Patients:  BloggerCourse.com  Fact Sheet for Healthcare Providers:  SeriousBroker.it  This test is no t yet approved or cleared by the [place]  FDA and  has been authorized for detection and/or diagnosis of SARS-CoV-2 by FDA under an Emergency Use Authorization (EUA). This EUA will remain  in effect (meaning this test can be used) for the duration of the COVID-19 declaration under Section 564(b)(1) of the Act, 21 U.S.C.section 360bbb-3(b)(1), unless the authorization is terminated  or revoked sooner.        Influenza A by PCR NEGATIVE NEGATIVE Final   Influenza B by PCR NEGATIVE NEGATIVE Final    Comment: (NOTE) The Xpert Xpress SARS-CoV-2/FLU/RSV plus assay is intended as an aid in the diagnosis of influenza from Nasopharyngeal swab specimens and should not be used as a sole basis for treatment. Nasal washings and aspirates are unacceptable for Xpert Xpress SARS-CoV-2/FLU/RSV testing.  Fact Sheet for Patients: BloggerCourse.com  Fact Sheet for Healthcare Providers: SeriousBroker.it  This test is not yet approved or cleared by the [place]  FDA and has been authorized for detection and/or diagnosis of SARS-CoV-2 by FDA under an Emergency Use Authorization (EUA). This EUA will remain in effect (meaning this test can be used) for the duration of the COVID-19 declaration under Section 564(b)(1) of the Act, 21 U.S.C. section 360bbb-3(b)(1), unless the authorization is terminated or revoked.     Resp Syncytial Virus by PCR NEGATIVE NEGATIVE Final    Comment: (NOTE) Fact Sheet for Patients: BloggerCourse.com  Fact Sheet for Healthcare Providers: SeriousBroker.it  This test is not yet approved or cleared by the [place]  FDA and has been authorized for detection and/or diagnosis of SARS-CoV-2 by FDA under an Emergency Use Authorization (EUA). This EUA will remain in effect (meaning this test can be used) for the duration of the COVID-19 declaration under Section 564(b)(1) of the Act, 21 U.S.C. section 360bbb-3(b)(1), unless the authorization is terminated or revoked.  Performed at [facility], [address], [place], [place] [address]   Aerobic/Anaerobic Culture w Gram Stain (surgical/deep wound)     Status: None   Collection Time: 01/22/24  9:21 PM   Specimen: Abdomen  Result Value  Ref Range Status   Specimen Description   Final     ABDOMEN Performed at [facility], [address] [place], [place] [address]    Special Requests   Final    NONE Performed at [facility], [address], [place], [place] [address]    Gram Stain   Final    FEW WBC PRESENT, PREDOMINANTLY PMN NO ORGANISMS SEEN    Culture   Final    FEW ENTEROCOCCUS FAECALIS NO ANAEROBES ISOLATED Performed at [facility], [address] [place], [place] [address]    Report Status 01/27/2024 FINAL  Final   Organism ID, Bacteria ENTEROCOCCUS FAECALIS  Final      Susceptibility   Enterococcus faecalis - MIC*    AMPICILLIN <=2 SENSITIVE Sensitive     VANCOMYCIN 2 SENSITIVE Sensitive     GENTAMICIN SYNERGY SENSITIVE Sensitive     * FEW ENTEROCOCCUS FAECALIS    [x]  Patient discharged originally without antimicrobial agent and treatment is now indicated  41 YOF with recent sigmoid resection 7/8 d/t diverticulitis/fistula sent from her PCP office due elevated WBC for evaluation. Afebrile in the ED however WBC 17.5 and slight increase in abd pain. Abd CT showed organized gas/fluid around the peritoneum continuous with a gas/fluid collection along the SQ soft tissues of the lower abd/pelvis.   Dr. Tanda with surgery saw in the ED and aspirated the fluid collection, felt to be a seroma, and not discharged on antibiotic therapy. The culture has now resulted as E faecalis and surgery was contacted with the result. Noted the patient has a follow-up visit  scheduled already on 02/04/24 @ 4PM with surgery.   New antibiotic prescription: Augmentin  875 mg po twice daily x 14d - to be sent in by surgery team per discussion directly with them (communication with Vertell Pringle)  Thank you for allowing pharmacy to be a part of this patient's care.  Almarie Lunger, PharmD, BCPS, BCIDP Infectious Diseases Clinical Pharmacist 01/28/2024 11:12 AM   **Pharmacist phone directory can now be found on [email or web address] ([email or web address]).  Listed  under [facility].

## 2024-02-01 ENCOUNTER — Encounter: Payer: Self-pay | Admitting: Internal Medicine

## 2024-02-05 ENCOUNTER — Ambulatory Visit (HOSPITAL_COMMUNITY)
Admission: RE | Admit: 2024-02-05 | Discharge: 2024-02-05 | Disposition: A | Discharge status: Home / Self Care | Source: Ambulatory Visit | Attending: *Deleted | Admitting: *Deleted

## 2024-02-05 ENCOUNTER — Other Ambulatory Visit (HOSPITAL_COMMUNITY): Payer: Self-pay | Admitting: General Surgery

## 2024-02-05 DIAGNOSIS — L24B3 Irritant contact dermatitis related to fecal or urinary stoma or fistula: Secondary | ICD-10-CM

## 2024-02-05 DIAGNOSIS — Z432 Encounter for attention to ileostomy: Secondary | ICD-10-CM | POA: Diagnosis not present

## 2024-02-05 DIAGNOSIS — Z933 Colostomy status: Secondary | ICD-10-CM

## 2024-02-05 NOTE — Discharge Instructions (Signed)
 Remove old pouch and clean with mild soap and water  Apply stoma powder to irritated skin Seal with skin prep (can repeat those 2 steps If needed)  Apply barrier ring Cut pouch barrier to fit (1 3/8 today) Coloplast flexible 2piece)  Apply barrier  Attach pouch to barrier Applied ostomy belt today.  Call [facility] [phone]

## 2024-02-05 NOTE — Progress Notes (Signed)
 [facility]   Reason for visit:  RLQ ileostomy  History of stoma 20 years ago.  HPI:  Colovesicular fistula repair Past Medical History:  Diagnosis Date   Anemia    Anxiety    Diverticulitis    Dyslipidemia    Dysrhythmia    A.fib   GERD (gastroesophageal reflux disease)    Hypertension    PAD (peripheral artery disease) (HCC)    PONV (postoperative nausea and vomiting)    Psoriasis    RA (rheumatoid arthritis) (HCC)    Thrombocytosis    Vertigo    Family History  Problem Relation Age of Onset   Lung cancer Mother    Lung cancer Father    Stomach cancer Brother    Breast cancer Cousin    Colon cancer Neg Hx    Allergies  Allergen Reactions   Codeine     Headache-draws line across forehead   Current Outpatient Medications  Medication Sig Dispense Refill Last Dose/Taking   acidophilus (RISAQUAD) CAPS capsule Take 1 capsule by mouth daily.      amiodarone  (PACERONE ) 200 MG tablet Take 200 mg by mouth at bedtime. (Patient taking differently: Take 100 mg by mouth at bedtime.)      amLODipine  (NORVASC ) 5 MG tablet Take 5 mg by mouth daily.      atorvastatin  (LIPITOR) 80 MG tablet Take 80 mg by mouth at bedtime.      calcium -vitamin D (OSCAL WITH D) 500-5 MG-MCG tablet Take 1 tablet by mouth 2 (two) times daily.      cetirizine (ZYRTEC) 10 MG tablet Take 10 mg by mouth daily.      Cholecalciferol  (VITAMIN D3) 1.25 MG (50000 UT) CAPS Take 50,000 Units by mouth every Wednesday.      CINNAMON  PO Take 1 tablet by mouth daily.      Cyanocobalamin  (VITAMIN B 12 PO) Place 3,000 mcg under the tongue daily.      Cyanocobalamin  (VITAMIN B-12 IJ) Inject 1,000 mcg into the muscle once a week. Wednesday      ergocalciferol (VITAMIN D2) 1.25 MG (50000 UT) capsule Take 50,000 Units by mouth every Wednesday.      fluticasone  (FLONASE ) 50 MCG/ACT nasal spray Place 1 spray into both nostrils daily as needed for allergies or rhinitis.      gabapentin  (NEURONTIN ) 300 MG capsule  Take 300 mg by mouth 2 (two) times daily.      glucosamine-chondroitin 500-400 MG tablet Take 1 tablet by mouth daily.      latanoprost  (XALATAN ) 0.005 % ophthalmic solution Place 1 drop into both eyes at bedtime.      Magnesium  Citrate (MAGNESIUM  GUMMIES PO) Take 2 tablets by mouth daily.      metoprolol  succinate (TOPROL -XL) 50 MG 24 hr tablet Take 50 mg by mouth 2 (two) times daily.      oxyCODONE  (OXY IR/ROXICODONE ) 5 MG immediate release tablet Take 1 tablet (5 mg total) by mouth every 6 (six) hours as needed for severe pain (pain score 7-10). 15 tablet 0    predniSONE  (DELTASONE ) 5 MG tablet Take 5 mg by mouth daily.      sodium chloride  flush (NS) 0.9 % SOLN 10 mLs by Intracatheter route as needed (flush). 300 mL 0    XARELTO  20 MG TABS tablet Take 20 mg by mouth daily.      No current facility-administered medications for this encounter.   ROS  Review of Systems  Constitutional:  Positive for fatigue.  Cardiovascular:  PAD  Skin:  Positive for color change.       Peristomal breakdown   Psychiatric/Behavioral:  The patient is nervous/anxious.   All other systems reviewed and are negative.  Vital signs:  BP (!) 111/49   Pulse (!) 58   Temp 98.4 F (36.9 C) (Oral)   Resp 18   SpO2 98%  Exam:  Physical Exam Vitals reviewed.  Constitutional:      Appearance: Normal appearance.  HENT:     Mouth/Throat:     Mouth: Mucous membranes are moist.  Cardiovascular:     Rate and Rhythm: Normal rate.  Abdominal:     Palpations: Abdomen is soft.  Neurological:     Mental Status: She is alert.     Stoma type/location:  Ileostomy (used previous site)  1 1/4 flush, located in crease Stomal assessment/size:  1 1/4 flush  Peristomal assessment:  creasing, erythema and irritation from leaking stool.  Treatment options for stomal/peristomal skin: stoma powder skin prep barrier ring.  Due to creasing, convex pouch is popping off with activity  Will switch to flexible convex  system.  Output: soft brown stool  Ostomy pouching: 2pc. Coloplast flexible convex pouch with barrier ring and belt.  Stoma powder and skin prep as needed.  Education provided:  we perform pouch change.  The system does fold and conform to the creasing in the abdomen.  The belt will improve seal and wear time as well.     Impression/dx  Ileostomy Irritant contact dermatitis Discussion  New flexible pouching system with belt.  Plan  See back as needed.  Will enroll with [facility] for supplies.      Visit time: 55 minutes.   Darice Cooley FNP-BC

## 2024-02-08 DIAGNOSIS — Z432 Encounter for attention to ileostomy: Secondary | ICD-10-CM | POA: Insufficient documentation

## 2024-02-08 DIAGNOSIS — L24B3 Irritant contact dermatitis related to fecal or urinary stoma or fistula: Secondary | ICD-10-CM | POA: Insufficient documentation

## 2024-02-09 ENCOUNTER — Other Ambulatory Visit (HOSPITAL_COMMUNITY): Payer: Self-pay | Admitting: Nurse Practitioner

## 2024-02-09 DIAGNOSIS — L24B3 Irritant contact dermatitis related to fecal or urinary stoma or fistula: Secondary | ICD-10-CM

## 2024-02-09 DIAGNOSIS — Z433 Encounter for attention to colostomy: Secondary | ICD-10-CM

## 2024-02-16 ENCOUNTER — Ambulatory Visit (HOSPITAL_COMMUNITY)
Admission: RE | Admit: 2024-02-16 | Discharge: 2024-02-16 | Disposition: A | Discharge status: Home / Self Care | Source: Ambulatory Visit | Attending: General Surgery | Admitting: General Surgery

## 2024-02-16 DIAGNOSIS — Z933 Colostomy status: Secondary | ICD-10-CM | POA: Diagnosis present

## 2024-02-16 MED ORDER — IOHEXOL 300 MG/ML  SOLN
100.0000 mL | Freq: Once | INTRAMUSCULAR | Status: AC | PRN
  Administered 2024-02-16 (×2): 100 mL

## 2024-03-02 ENCOUNTER — Ambulatory Visit: Payer: Self-pay | Admitting: General Surgery

## 2024-03-22 NOTE — Progress Notes (Signed)
 COVID Vaccine received:  []  No [x]  Yes Date of any COVID positive Test in last 90 days: no PCP - Venetia Foster PA-C in [place] Cardiologist - Dr. B. Zagol @ [facility] in [place]  Records requested from Dr. Zagol.  Chest x-ray -  EKG -  01/01/24 epic Stress Test -  ECHO -  Cardiac Cath -   Bowel Prep - [x]  No  []   Yes ______  Pacemaker / ICD device [x]  No []  Yes   Spinal Cord Stimulator:[x]  No []  Yes       History of Sleep Apnea? [x]  No []  Yes   CPAP used?- [x]  No []  Yes    Does the patient monitor blood sugar?          [x]  No []  Yes  []  N/A  Patient has: [x]  NO Hx DM   []  Pre-DM                 []  DM1  []   DM2 Does patient have a [employer] or Dexacom? []  No []  Yes   Fasting Blood Sugar Ranges-  Checks Blood Sugar _____ times a day  GLP1 agonist / usual dose - no GLP1 instructions:  SGLT-2 inhibitors / usual dose - no SGLT-2 instructions:   Blood Thinner / Instructions:Xarelto . No orders on when to stop taking.Pt. Will call MD to see when to stop. Aspirin Instructions:No  Comments:   Activity level: Patient is able  to climb a flight of stairs without difficulty; [x]  No CP  [x]  No SOB,    Patient can perform ADLs without assistance.   Anesthesia review: On Xarelto , A-fib  Patient denies shortness of breath, fever, cough and chest pain at PAT appointment.  Patient verbalized understanding and agreement to the Pre-Surgical Instructions that were given to them at this PAT appointment. Patient was also educated of the need to review these PAT instructions again prior to his/her surgery.I reviewed the appropriate phone numbers to call if they have any and questions or concerns.

## 2024-03-22 NOTE — Patient Instructions (Addendum)
 SURGICAL WAITING ROOM VISITATION  Patients having surgery or a procedure may have no more than 2 support people in the waiting area - these visitors may rotate.    Children under the age of 14 must have an adult with them who is not the patient.  Visitors with respiratory illnesses are discouraged from visiting and should remain at home.  If the patient needs to stay at the hospital during part of their recovery, the visitor guidelines for inpatient rooms apply. Pre-op nurse will coordinate an appropriate time for 1 support person to accompany patient in pre-op.  This support person may not rotate.    Please refer to the [facility] website for the visitor guidelines for Inpatients (after your surgery is over and you are in a regular room).       Your procedure is scheduled on: 03/29/24   Report to [facility] Main Entrance    Report to admitting at 10 AM   Call this number if you have problems the morning of surgery [phone]   Do not eat food :After Midnight.   After Midnight you may have the following liquids until 9 AM DAY OF SURGERY  Water  Non-Citrus Juices (without pulp, NO RED-Apple, White grape, White cranberry) Black Coffee (NO MILK/CREAM OR CREAMERS, sugar ok)  Clear Tea (NO MILK/CREAM OR CREAMERS, sugar ok) regular and decaf                             Plain Jell-O (NO RED)                                           Fruit ices (not with fruit pulp, NO RED)                                     Popsicles (NO RED)                                                               Sports drinks like Gatorade (NO RED)                FOLLOW BOWEL PREP AND ANY ADDITIONAL PRE OP INSTRUCTIONS YOU RECEIVED FROM YOUR SURGEON'S OFFICE!!!     Oral Hygiene is also important to reduce your risk of infection.                                    Remember - BRUSH YOUR TEETH THE MORNING OF SURGERY WITH YOUR REGULAR TOOTHPASTE  DENTURES WILL BE REMOVED PRIOR TO SURGERY PLEASE DO  NOT APPLY Poly grip OR ADHESIVES!!!     Stop all vitamins and herbal supplements 7 days before surgery.   Take these medicines the morning of surgery with A SIP OF WATER : Amiodarone , amlodipine , atorvastatin , Zyrtec, nasal spray, gabapentin , metoprolol , prednisone .             You may not have any metal on your body including hair pins, jewelry, and body piercing  Do not wear make-up, lotions, powders, perfumes/cologne, or deodorant  Do not wear nail polish including gel and S&S, artificial/acrylic nails, or any other type of covering on natural nails including finger and toenails. If you have artificial nails, gel coating, etc. that needs to be removed by a nail salon please have this removed prior to surgery or surgery may need to be canceled/ delayed if the surgeon/ anesthesia feels like they are unable to be safely monitored.   Do not shave  48 hours prior to surgery.    Do not bring valuables to the hospital. [facility] IS NOT             RESPONSIBLE   FOR VALUABLES.   Contacts, glasses, dentures or bridgework may not be worn into surgery.   Bring small overnight bag day of surgery.   DO NOT BRING YOUR HOME MEDICATIONS TO THE HOSPITAL. PHARMACY WILL DISPENSE MEDICATIONS LISTED ON YOUR MEDICATION LIST TO YOU DURING YOUR ADMISSION IN THE HOSPITAL!    Patients discharged on the day of surgery will not be allowed to drive home.  Someone NEEDS to stay with you for the first 24 hours after anesthesia.   Special Instructions: Bring a copy of your healthcare power of attorney and living will documents the day of surgery if you haven't scanned them before.              Please read over the following fact sheets you were given: IF YOU HAVE QUESTIONS ABOUT YOUR PRE-OP INSTRUCTIONS PLEASE CALL [phone] [facility]   If you received a COVID test during your pre-op visit  it is requested that you wear a mask when out in public, stay away from anyone that may not be feeling well and  notify your surgeon if you develop symptoms. If you test positive for Covid or have been in contact with anyone that has tested positive in the last 10 days please notify you surgeon.    [facility] - Preparing for Surgery Before surgery, you can play an important role.  Because skin is not sterile, your skin needs to be as free of germs as possible.  You can reduce the number of germs on your skin by washing with CHG (chlorahexidine gluconate) soap before surgery.  CHG is an antiseptic cleaner which kills germs and bonds with the skin to continue killing germs even after washing. Please DO NOT use if you have an allergy to CHG or antibacterial soaps.  If your skin becomes reddened/irritated stop using the CHG and inform your nurse when you arrive at Short Stay. Do not shave (including legs and underarms) for at least 48 hours prior to the first CHG shower.  You may shave your face/neck.  Please follow these instructions carefully:  1.  Shower with CHG Soap the night before surgery and the  morning of surgery.  2.  If you choose to wash your hair, wash your hair first as usual with your normal  shampoo.  3.  After you shampoo, rinse your hair and body thoroughly to remove the shampoo.                             4.  Use CHG as you would any other liquid soap.  You can apply chg directly to the skin and wash.  Gently with a scrungie or clean washcloth.  5.  Apply the CHG Soap to your body ONLY FROM THE NECK DOWN.  Do   not use on face/ open                           Wound or open sores. Avoid contact with eyes, ears mouth and   genitals (private parts).                       Wash face,  Genitals (private parts) with your normal soap.             6.  Wash thoroughly, paying special attention to the area where your    surgery  will be performed.  7.  Thoroughly rinse your body with warm water  from the neck down.  8.  DO NOT shower/wash with your normal soap after using and rinsing off the CHG Soap.                 9.  Pat yourself dry with a clean towel.            10.  Wear clean pajamas.            11.  Place clean sheets on your bed the night of your first shower and do not  sleep with pets. Day of Surgery : Do not apply any lotions/deodorants the morning of surgery.  Please wear clean clothes to the hospital/surgery center.  FAILURE TO FOLLOW THESE INSTRUCTIONS MAY RESULT IN THE CANCELLATION OF YOUR SURGERY  PATIENT SIGNATURE_________________________________  NURSE SIGNATURE__________________________________  ________________________________________________________________________

## 2024-03-24 ENCOUNTER — Encounter (HOSPITAL_COMMUNITY): Payer: Self-pay

## 2024-03-24 ENCOUNTER — Encounter (HOSPITAL_COMMUNITY)
Admission: RE | Admit: 2024-03-24 | Discharge: 2024-03-24 | Disposition: A | Discharge status: Home / Self Care | Source: Ambulatory Visit | Attending: General Surgery | Admitting: General Surgery

## 2024-03-24 ENCOUNTER — Other Ambulatory Visit: Payer: Self-pay

## 2024-03-24 VITALS — BP 139/62 | HR 59 | Temp 98.3°F | Resp 16 | Ht 63.0 in | Wt 140.0 lb

## 2024-03-24 DIAGNOSIS — I10 Essential (primary) hypertension: Secondary | ICD-10-CM | POA: Insufficient documentation

## 2024-03-24 DIAGNOSIS — Z01812 Encounter for preprocedural laboratory examination: Secondary | ICD-10-CM | POA: Insufficient documentation

## 2024-03-24 DIAGNOSIS — Z01818 Encounter for other preprocedural examination: Secondary | ICD-10-CM

## 2024-03-24 LAB — CBC
HCT: 40.3 % (ref 36.0–46.0)
Hemoglobin: 12.2 g/dL (ref 12.0–15.0)
MCH: 28.2 pg (ref 26.0–34.0)
MCHC: 30.3 g/dL (ref 30.0–36.0)
MCV: 93.1 fL (ref 80.0–100.0)
Platelets: 535 K/uL — ABNORMAL HIGH (ref 150–400)
RBC: 4.33 MIL/uL (ref 3.87–5.11)
RDW: 14.1 % (ref 11.5–15.5)
WBC: 22 K/uL — ABNORMAL HIGH (ref 4.0–10.5)
nRBC: 0 % (ref 0.0–0.2)

## 2024-03-24 LAB — BASIC METABOLIC PANEL WITH GFR
Anion gap: 14 (ref 5–15)
BUN: 19 mg/dL (ref 8–23)
CO2: 20 mmol/L — ABNORMAL LOW (ref 22–32)
Calcium: 9.2 mg/dL (ref 8.9–10.3)
Chloride: 102 mmol/L (ref 98–111)
Creatinine, Ser: 1.12 mg/dL — ABNORMAL HIGH (ref 0.44–1.00)
GFR, Estimated: 53 mL/min — ABNORMAL LOW (ref >60)
Glucose, Bld: 75 mg/dL (ref 70–99)
Potassium: 4.1 mmol/L (ref 3.5–5.1)
Sodium: 136 mmol/L (ref 135–145)

## 2024-03-25 NOTE — Progress Notes (Signed)
 Request sent to Dr. Stevie to view pre op CBC from 03/24/24.

## 2024-03-29 ENCOUNTER — Inpatient Hospital Stay (HOSPITAL_COMMUNITY)

## 2024-03-29 ENCOUNTER — Encounter (HOSPITAL_COMMUNITY): Payer: Self-pay | Admitting: General Surgery

## 2024-03-29 ENCOUNTER — Inpatient Hospital Stay (HOSPITAL_COMMUNITY): Payer: Self-pay | Admitting: Physician Assistant

## 2024-03-29 ENCOUNTER — Encounter (HOSPITAL_COMMUNITY)
Admission: RE | Disposition: A | Discharge status: Home / Self Care | Payer: Self-pay | Source: Ambulatory Visit | Attending: General Surgery

## 2024-03-29 ENCOUNTER — Other Ambulatory Visit: Payer: Self-pay

## 2024-03-29 ENCOUNTER — Inpatient Hospital Stay (HOSPITAL_COMMUNITY)
Admission: RE | Admit: 2024-03-29 | Discharge: 2024-03-31 | DRG: 331 | Disposition: A | Discharge status: Home / Self Care | Source: Ambulatory Visit | Attending: General Surgery | Admitting: General Surgery

## 2024-03-29 DIAGNOSIS — Z9049 Acquired absence of other specified parts of digestive tract: Secondary | ICD-10-CM | POA: Diagnosis not present

## 2024-03-29 DIAGNOSIS — Z432 Encounter for attention to ileostomy: Secondary | ICD-10-CM | POA: Diagnosis present

## 2024-03-29 DIAGNOSIS — K5732 Diverticulitis of large intestine without perforation or abscess without bleeding: Secondary | ICD-10-CM | POA: Diagnosis not present

## 2024-03-29 HISTORY — PX: ILEOSTOMY CLOSURE: SHX1784

## 2024-03-29 LAB — CREATININE, SERUM
Creatinine, Ser: 1.2 mg/dL — ABNORMAL HIGH (ref 0.44–1.00)
GFR, Estimated: 48 mL/min — ABNORMAL LOW (ref >60)

## 2024-03-29 LAB — CBC
HCT: 38.9 % (ref 36.0–46.0)
Hemoglobin: 12.2 g/dL (ref 12.0–15.0)
MCH: 29.5 pg (ref 26.0–34.0)
MCHC: 31.4 g/dL (ref 30.0–36.0)
MCV: 94.2 fL (ref 80.0–100.0)
Platelets: 519 K/uL — ABNORMAL HIGH (ref 150–400)
RBC: 4.13 MIL/uL (ref 3.87–5.11)
RDW: 14.4 % (ref 11.5–15.5)
WBC: 17.3 K/uL — ABNORMAL HIGH (ref 4.0–10.5)
nRBC: 0 % (ref 0.0–0.2)

## 2024-03-29 SURGERY — CLOSURE, ILEOSTOMY
Anesthesia: General | Site: Abdomen

## 2024-03-29 MED ORDER — LIDOCAINE HCL (CARDIAC) PF 100 MG/5ML IV SOSY
PREFILLED_SYRINGE | INTRAVENOUS | Status: DC | PRN
  Administered 2024-03-29: 60 mg via INTRAVENOUS
  Administered 2024-03-29: 5 mg via INTRAVENOUS

## 2024-03-29 MED ORDER — ROCURONIUM BROMIDE 100 MG/10ML IV SOLN
INTRAVENOUS | Status: DC | PRN
  Administered 2024-03-29: 50 mg via INTRAVENOUS

## 2024-03-29 MED ORDER — DROPERIDOL 2.5 MG/ML IJ SOLN
INTRAMUSCULAR | Status: AC
  Filled 2024-03-29: qty 2

## 2024-03-29 MED ORDER — DEXAMETHASONE SODIUM PHOSPHATE 10 MG/ML IJ SOLN
INTRAMUSCULAR | Status: AC
  Filled 2024-03-29: qty 1

## 2024-03-29 MED ORDER — SULFASALAZINE 500 MG PO TABS
1000.0000 mg | ORAL_TABLET | Freq: Two times a day (BID) | ORAL | Status: DC
  Administered 2024-03-29 – 2024-03-31 (×5): 1000 mg via ORAL
  Filled 2024-03-29 (×5): qty 2

## 2024-03-29 MED ORDER — GLUCOSAMINE-CHONDROITIN 500-400 MG PO TABS: 1.0000 | ORAL_TABLET | Freq: Every day | ORAL | Status: DC

## 2024-03-29 MED ORDER — LORATADINE 10 MG PO TABS
10.0000 mg | ORAL_TABLET | Freq: Every day | ORAL | Status: DC
  Administered 2024-03-30 – 2024-03-31 (×2): 10 mg via ORAL
  Filled 2024-03-29 (×2): qty 1

## 2024-03-29 MED ORDER — CHLORHEXIDINE GLUCONATE CLOTH 2 % EX PADS: 6.0000 | MEDICATED_PAD | Freq: Once | CUTANEOUS | Status: DC

## 2024-03-29 MED ORDER — METOPROLOL TARTRATE 5 MG/5ML IV SOLN: 5.0000 mg | Freq: Four times a day (QID) | INTRAVENOUS | Status: DC | PRN

## 2024-03-29 MED ORDER — DIPHENHYDRAMINE HCL 50 MG/ML IJ SOLN: 12.5000 mg | Freq: Four times a day (QID) | INTRAMUSCULAR | Status: DC | PRN

## 2024-03-29 MED ORDER — OXYCODONE HCL 5 MG PO TABS
5.0000 mg | ORAL_TABLET | ORAL | Status: DC | PRN
  Administered 2024-03-30 (×2): 5 mg via ORAL
  Filled 2024-03-29 (×2): qty 1

## 2024-03-29 MED ORDER — ENSURE SURGERY PO LIQD
237.0000 mL | Freq: Two times a day (BID) | ORAL | Status: DC
  Administered 2024-03-29 – 2024-03-31 (×4): 237 mL via ORAL

## 2024-03-29 MED ORDER — DROPERIDOL 2.5 MG/ML IJ SOLN
0.6250 mg | Freq: Once | INTRAMUSCULAR | Status: AC | PRN
  Administered 2024-03-29: 0.625 mg via INTRAVENOUS

## 2024-03-29 MED ORDER — ONDANSETRON 4 MG PO TBDP: 4.0000 mg | ORAL_TABLET | Freq: Four times a day (QID) | ORAL | Status: DC | PRN

## 2024-03-29 MED ORDER — FENTANYL CITRATE (PF) 100 MCG/2ML IJ SOLN
INTRAMUSCULAR | Status: AC
  Filled 2024-03-29: qty 2

## 2024-03-29 MED ORDER — DIPHENHYDRAMINE HCL 12.5 MG/5ML PO ELIX
12.5000 mg | ORAL_SOLUTION | Freq: Four times a day (QID) | ORAL | Status: DC | PRN
  Administered 2024-03-30: 12.5 mg via ORAL
  Filled 2024-03-29: qty 5

## 2024-03-29 MED ORDER — MIDAZOLAM HCL 2 MG/2ML IJ SOLN
INTRAMUSCULAR | Status: AC
  Filled 2024-03-29: qty 2

## 2024-03-29 MED ORDER — GABAPENTIN 300 MG PO CAPS
300.0000 mg | ORAL_CAPSULE | Freq: Three times a day (TID) | ORAL | Status: DC
  Administered 2024-03-29 – 2024-03-31 (×6): 300 mg via ORAL
  Filled 2024-03-29 (×6): qty 1

## 2024-03-29 MED ORDER — ONDANSETRON HCL 4 MG/2ML IJ SOLN
INTRAMUSCULAR | Status: AC
  Filled 2024-03-29: qty 2

## 2024-03-29 MED ORDER — PHENYLEPHRINE HCL (PRESSORS) 10 MG/ML IV SOLN
INTRAVENOUS | Status: AC
  Filled 2024-03-29: qty 1

## 2024-03-29 MED ORDER — FLUTICASONE PROPIONATE 50 MCG/ACT NA SUSP: 1.0000 | Freq: Every day | NASAL | Status: DC | PRN

## 2024-03-29 MED ORDER — ATORVASTATIN CALCIUM 40 MG PO TABS
80.0000 mg | ORAL_TABLET | Freq: Every day | ORAL | Status: DC
  Administered 2024-03-30: 80 mg via ORAL
  Filled 2024-03-29: qty 2

## 2024-03-29 MED ORDER — BUPIVACAINE-EPINEPHRINE (PF) 0.25% -1:200000 IJ SOLN
INTRAMUSCULAR | Status: DC | PRN
  Administered 2024-03-29: 30 mL

## 2024-03-29 MED ORDER — 0.9 % SODIUM CHLORIDE (POUR BTL) OPTIME
TOPICAL | Status: DC | PRN
  Administered 2024-03-29 (×2): 1000 mL

## 2024-03-29 MED ORDER — ACETAMINOPHEN 500 MG PO TABS
1000.0000 mg | ORAL_TABLET | Freq: Four times a day (QID) | ORAL | Status: DC
  Administered 2024-03-29 – 2024-03-31 (×6): 1000 mg via ORAL
  Filled 2024-03-29 (×6): qty 2

## 2024-03-29 MED ORDER — ONDANSETRON HCL 4 MG/2ML IJ SOLN: 4.0000 mg | Freq: Four times a day (QID) | INTRAMUSCULAR | Status: DC | PRN

## 2024-03-29 MED ORDER — CEFOTETAN DISODIUM 2 G IJ SOLR
2.0000 g | INTRAMUSCULAR | Status: AC
  Administered 2024-03-29: 2 g via INTRAVENOUS
  Filled 2024-03-29: qty 2

## 2024-03-29 MED ORDER — ROCURONIUM BROMIDE 10 MG/ML (PF) SYRINGE
PREFILLED_SYRINGE | INTRAVENOUS | Status: AC
  Filled 2024-03-29: qty 10

## 2024-03-29 MED ORDER — EPHEDRINE SULFATE-NACL 50-0.9 MG/10ML-% IV SOSY
PREFILLED_SYRINGE | INTRAVENOUS | Status: DC | PRN
  Administered 2024-03-29: 5 mg via INTRAVENOUS

## 2024-03-29 MED ORDER — MIDAZOLAM HCL 5 MG/5ML IJ SOLN
INTRAMUSCULAR | Status: DC | PRN
  Administered 2024-03-29: 2 mg via INTRAVENOUS

## 2024-03-29 MED ORDER — DEXTROSE-SODIUM CHLORIDE 5-0.45 % IV SOLN: INTRAVENOUS | Status: AC

## 2024-03-29 MED ORDER — BUPIVACAINE-EPINEPHRINE (PF) 0.25% -1:200000 IJ SOLN
INTRAMUSCULAR | Status: AC
  Filled 2024-03-29: qty 30

## 2024-03-29 MED ORDER — SUGAMMADEX SODIUM 200 MG/2ML IV SOLN
INTRAVENOUS | Status: AC
  Filled 2024-03-29: qty 2

## 2024-03-29 MED ORDER — EPHEDRINE 5 MG/ML INJ
INTRAVENOUS | Status: AC
  Filled 2024-03-29: qty 5

## 2024-03-29 MED ORDER — ACETAMINOPHEN 500 MG PO TABS
1000.0000 mg | ORAL_TABLET | ORAL | Status: AC
Start: 2024-03-29 — End: 2024-03-29
  Administered 2024-03-29: 1000 mg via ORAL
  Filled 2024-03-29: qty 2

## 2024-03-29 MED ORDER — DEXMEDETOMIDINE HCL IN NACL 80 MCG/20ML IV SOLN
INTRAVENOUS | Status: DC | PRN
  Administered 2024-03-29: 8 ug via INTRAVENOUS

## 2024-03-29 MED ORDER — SUGAMMADEX SODIUM 200 MG/2ML IV SOLN
INTRAVENOUS | Status: DC | PRN
  Administered 2024-03-29: 200 mg via INTRAVENOUS

## 2024-03-29 MED ORDER — AMLODIPINE BESYLATE 5 MG PO TABS
5.0000 mg | ORAL_TABLET | Freq: Every day | ORAL | Status: DC
  Administered 2024-03-30 – 2024-03-31 (×2): 5 mg via ORAL
  Filled 2024-03-29 (×2): qty 1

## 2024-03-29 MED ORDER — OYSTER SHELL CALCIUM/D3 500-5 MG-MCG PO TABS
1.0000 | ORAL_TABLET | Freq: Two times a day (BID) | ORAL | Status: DC
  Administered 2024-03-29 – 2024-03-31 (×5): 1 via ORAL
  Filled 2024-03-29 (×5): qty 1

## 2024-03-29 MED ORDER — DEXAMETHASONE SODIUM PHOSPHATE 10 MG/ML IJ SOLN
INTRAMUSCULAR | Status: DC | PRN
  Administered 2024-03-29: 10 mg via INTRAVENOUS

## 2024-03-29 MED ORDER — LACTATED RINGERS IV SOLN
INTRAVENOUS | Status: DC
Start: 2024-03-29 — End: 2024-03-29

## 2024-03-29 MED ORDER — ESMOLOL HCL 100 MG/10ML IV SOLN
INTRAVENOUS | Status: DC | PRN
  Administered 2024-03-29: 40 mg via INTRAVENOUS

## 2024-03-29 MED ORDER — POLYETHYLENE GLYCOL 3350 17 G PO PACK: 17.0000 g | PACK | Freq: Every day | ORAL | Status: DC | PRN

## 2024-03-29 MED ORDER — HYDROMORPHONE HCL 1 MG/ML IJ SOLN
0.5000 mg | INTRAMUSCULAR | Status: DC | PRN
  Administered 2024-03-30: 0.5 mg via INTRAVENOUS
  Filled 2024-03-29: qty 0.5

## 2024-03-29 MED ORDER — OXYCODONE HCL 5 MG PO TABS: 5.0000 mg | ORAL_TABLET | Freq: Once | ORAL | Status: DC | PRN

## 2024-03-29 MED ORDER — ACETAMINOPHEN 10 MG/ML IV SOLN: 1000.0000 mg | Freq: Once | INTRAVENOUS | Status: DC | PRN

## 2024-03-29 MED ORDER — FENTANYL CITRATE PF 50 MCG/ML IJ SOSY: 25.0000 ug | PREFILLED_SYRINGE | INTRAMUSCULAR | Status: DC | PRN

## 2024-03-29 MED ORDER — LIDOCAINE HCL (PF) 2 % IJ SOLN
INTRAMUSCULAR | Status: AC
  Filled 2024-03-29: qty 5

## 2024-03-29 MED ORDER — AMIODARONE HCL 100 MG PO TABS
100.0000 mg | ORAL_TABLET | Freq: Every day | ORAL | Status: DC
  Administered 2024-03-30: 100 mg via ORAL
  Filled 2024-03-29: qty 1

## 2024-03-29 MED ORDER — TRAMADOL HCL 50 MG PO TABS
50.0000 mg | ORAL_TABLET | Freq: Four times a day (QID) | ORAL | Status: DC | PRN
  Administered 2024-03-29 – 2024-03-30 (×2): 50 mg via ORAL
  Filled 2024-03-29 (×2): qty 1

## 2024-03-29 MED ORDER — CELECOXIB 200 MG PO CAPS
200.0000 mg | ORAL_CAPSULE | Freq: Two times a day (BID) | ORAL | Status: DC
  Administered 2024-03-29 – 2024-03-31 (×5): 200 mg via ORAL
  Filled 2024-03-29 (×5): qty 1

## 2024-03-29 MED ORDER — ENOXAPARIN SODIUM 40 MG/0.4ML IJ SOSY
40.0000 mg | PREFILLED_SYRINGE | INTRAMUSCULAR | Status: DC
  Administered 2024-03-30: 40 mg via SUBCUTANEOUS
  Filled 2024-03-29 (×2): qty 0.4

## 2024-03-29 MED ORDER — CHLORHEXIDINE GLUCONATE CLOTH 2 % EX PADS
6.0000 | MEDICATED_PAD | Freq: Once | CUTANEOUS | Status: DC
Start: 2024-03-29 — End: 2024-03-29

## 2024-03-29 MED ORDER — ORAL CARE MOUTH RINSE: 15.0000 mL | Freq: Once | OROMUCOSAL | Status: AC

## 2024-03-29 MED ORDER — PROPOFOL 10 MG/ML IV BOLUS
INTRAVENOUS | Status: DC | PRN
  Administered 2024-03-29: 125 ug/kg/min via INTRAVENOUS
  Administered 2024-03-29: 130 mg via INTRAVENOUS

## 2024-03-29 MED ORDER — METOPROLOL SUCCINATE ER 50 MG PO TB24
50.0000 mg | ORAL_TABLET | Freq: Two times a day (BID) | ORAL | Status: DC
  Administered 2024-03-29 – 2024-03-31 (×4): 50 mg via ORAL
  Filled 2024-03-29 (×4): qty 1

## 2024-03-29 MED ORDER — PHENYLEPHRINE HCL-NACL 20-0.9 MG/250ML-% IV SOLN
INTRAVENOUS | Status: DC | PRN
  Administered 2024-03-29: 15 ug/min via INTRAVENOUS

## 2024-03-29 MED ORDER — PROPOFOL 1000 MG/100ML IV EMUL
INTRAVENOUS | Status: AC
  Filled 2024-03-29: qty 100

## 2024-03-29 MED ORDER — PHENYLEPHRINE 80 MCG/ML (10ML) SYRINGE FOR IV PUSH (FOR BLOOD PRESSURE SUPPORT)
PREFILLED_SYRINGE | INTRAVENOUS | Status: AC
  Filled 2024-03-29: qty 10

## 2024-03-29 MED ORDER — CHLORHEXIDINE GLUCONATE 0.12 % MT SOLN
15.0000 mL | Freq: Once | OROMUCOSAL | Status: AC
  Administered 2024-03-29: 15 mL via OROMUCOSAL

## 2024-03-29 MED ORDER — OXYCODONE HCL 5 MG/5ML PO SOLN: 5.0000 mg | Freq: Once | ORAL | Status: DC | PRN

## 2024-03-29 MED ORDER — PROPOFOL 10 MG/ML IV BOLUS
INTRAVENOUS | Status: AC
  Filled 2024-03-29: qty 20

## 2024-03-29 MED ORDER — ONDANSETRON HCL 4 MG/2ML IJ SOLN
INTRAMUSCULAR | Status: DC | PRN
  Administered 2024-03-29: 4 mg via INTRAVENOUS

## 2024-03-29 MED ORDER — FENTANYL CITRATE (PF) 100 MCG/2ML IJ SOLN
INTRAMUSCULAR | Status: DC | PRN
  Administered 2024-03-29 (×2): 50 ug via INTRAVENOUS

## 2024-03-29 SURGICAL SUPPLY — 44 items
BAG COUNTER SPONGE SURGICOUNT (BAG) IMPLANT
BLADE HEX COATED 2.75 (ELECTRODE) ×1 IMPLANT
CHLORAPREP W/TINT 26 (MISCELLANEOUS) ×1 IMPLANT
COVER MAYO STAND STRL (DRAPES) ×1 IMPLANT
COVER SURGICAL LIGHT HANDLE (MISCELLANEOUS) IMPLANT
DRAPE LAPAROSCOPIC ABDOMINAL (DRAPES) ×1 IMPLANT
DRAPE UTILITY XL STRL (DRAPES) IMPLANT
DRAPE WARM FLUID 44X44 (DRAPES) ×1 IMPLANT
DRSG OPSITE POSTOP 4X10 (GAUZE/BANDAGES/DRESSINGS) IMPLANT
DRSG OPSITE POSTOP 4X6 (GAUZE/BANDAGES/DRESSINGS) IMPLANT
DRSG OPSITE POSTOP 4X8 (GAUZE/BANDAGES/DRESSINGS) IMPLANT
DRSG TELFA 3X8 NADH STRL (GAUZE/BANDAGES/DRESSINGS) IMPLANT
ELECT REM PT RETURN 15FT ADLT (MISCELLANEOUS) ×1 IMPLANT
GAUZE SPONGE 4X4 12PLY STRL (GAUZE/BANDAGES/DRESSINGS) ×1 IMPLANT
GLOVE BIOGEL PI IND STRL 7.0 (GLOVE) ×1 IMPLANT
GLOVE SURG SS PI 7.0 STRL IVOR (GLOVE) ×1 IMPLANT
GOWN STRL REUS W/ TWL LRG LVL3 (GOWN DISPOSABLE) ×1 IMPLANT
GOWN STRL REUS W/ TWL XL LVL3 (GOWN DISPOSABLE) IMPLANT
HANDLE SUCTION POOLE (INSTRUMENTS) ×1 IMPLANT
HOLDER FOLEY CATH W/STRAP (MISCELLANEOUS) IMPLANT
KIT BASIN OR (CUSTOM PROCEDURE TRAY) ×1 IMPLANT
KIT TURNOVER KIT A (KITS) ×1 IMPLANT
MANIFOLD NEPTUNE II (INSTRUMENTS) ×1 IMPLANT
NDL HYPO 22X1.5 SAFETY MO (MISCELLANEOUS) IMPLANT
NEEDLE HYPO 22X1.5 SAFETY MO (MISCELLANEOUS) ×1 IMPLANT
PACK GENERAL/GYN (CUSTOM PROCEDURE TRAY) ×1 IMPLANT
RELOAD STAPLE 75 3.8 BLU REG (ENDOMECHANICALS) IMPLANT
STAPLER GUN LINEAR PROX 60 (STAPLE) IMPLANT
STAPLER PROXIMATE 75MM BLUE (STAPLE) IMPLANT
SUT MNCRL AB 4-0 PS2 18 (SUTURE) IMPLANT
SUT NOVA NAB DX-16 0-1 5-0 T12 (SUTURE) IMPLANT
SUT NOVA NAB GS-21 0 18 T12 DT (SUTURE) ×2 IMPLANT
SUT PDS PLUS AB 0 CT-2 (SUTURE) IMPLANT
SUT PROLENE 2 0 BLUE (SUTURE) IMPLANT
SUT SILK 2 0 SH CR/8 (SUTURE) ×1 IMPLANT
SUT SILK 2-0 18XBRD TIE 12 (SUTURE) ×1 IMPLANT
SUT SILK 3 0 SH CR/8 (SUTURE) ×1 IMPLANT
SUT SILK 3-0 18XBRD TIE 12 (SUTURE) ×1 IMPLANT
SUT VIC AB 2-0 SH 18 (SUTURE) IMPLANT
SUT VIC AB 2-0 SH 27X BRD (SUTURE) ×2 IMPLANT
SUT VIC AB 4-0 PS2 18 (SUTURE) ×1 IMPLANT
SYR 20ML LL LF (SYRINGE) IMPLANT
TOWEL OR 17X26 10 PK STRL BLUE (TOWEL DISPOSABLE) ×2 IMPLANT
YANKAUER SUCT BULB TIP NO VENT (SUCTIONS) ×1 IMPLANT

## 2024-03-29 NOTE — H&P (Signed)
 PROVIDER: [facility]LEN[facility], MD DATE OF ENCOUNTER: 02/22/2024 Interval History:   History of Present Illness Olivia Olivia [name] is a 68 year old female with a history of diverticulitis and recent stoma placement who presents for follow-up regarding potential stoma reversal.  Her bowel movements are currently managed with a stoma placed after a complex surgery in July. Initially, she had liquid output, which has since thickened. An incision infection required antibiotics, which she has completed. The surgery involved extensive work due to scar tissue, a bladder connection, and a fistula repair, with a stoma placed to ensure healing.  She is considering a stoma reversal in September or October, pending further evaluation and a barium enema to assess healing. She has concerns about diverticula, recalling a severe episode of peritonitis, but was informed that the most affected colon portion was removed.  She manages her stoma with some difficulty, experiencing issues with her ostomy bag and occasional stoma pain, especially after lifting heavy objects. She plans to visit an ostomy clinic for a two-piece bag. For pain management, she uses oxycodone  and gabapentin , avoiding concurrent use due to a previous adverse reaction.  Physical Examination:  Physical Exam Constitutional:  Appearance: Normal appearance.  HENT:  Head: Normocephalic and atraumatic.  Pulmonary:  Effort: Pulmonary effort is normal.  Abdominal:  Comments: Incisions well healed, ostomy in position and functional  Musculoskeletal:  General: Normal range of motion.  Cervical back: Normal range of motion.  Neurological:  General: No focal deficit present.  Mental Status: She is alert and oriented to person, place, and time. Mental status is at baseline.  Psychiatric:  Olivia [name] and Affect: Olivia [name] normal.  Behavior: Behavior normal.  Thought Content: Thought content normal.     Physical Exam ABDOMEN: Incisions healed. Incision  previously infected, now fine.  Assessment and Plan:  Olivia Olivia [name] is a 68 y.o. female who underwent robotic sigmoid resection with loop ileostomy 3 weeks ago.  Diagnoses and all orders for this visit:  Colostomy in place (CMS/HHS-HCC) - X-ray barium enema air contrast; Future   Assessment & Plan Colostomy status and management Status post diverticulitis resection with colostomy. Incisions healing well. Issues with ostomy bag integrity during activities. Concerns about swimming and lifting affecting stoma. Colostomy reversal contingent on barium enema results. Explained reversal surgery is quicker and involves reconnecting and closing muscle. Barium enema to assess healing and leaks. 90% do not have recurrent symptoms post-resection. -plan for reversal of ileostomy -inpatient admission  Pain at ileostomy site Intermittent pain possibly related to lifting. Managed with oxycodone  and gabapentin , not taken simultaneously due to adverse effects. - Advise caution with lifting heavy objects to prevent pain. - Continue current pain management regimen with caution regarding simultaneous use of oxycodone  and gabapentin .  The plan was discussed in detail with the patient today, who expressed understanding. The patient has my contact information, and understands to call me with any additional questions or concerns in the interval. I would be happy to see the patient back sooner if the need arises.

## 2024-03-29 NOTE — Anesthesia Preprocedure Evaluation (Addendum)
 Anesthesia Evaluation  Patient identified by MRN, date of birth, ID band Patient awake    Reviewed: Allergy & Precautions, NPO status , Patient's Chart, lab work & pertinent test results  History of Anesthesia Complications (+) PONV and history of anesthetic complications  Airway Mallampati: II  TM Distance: <3 FB Neck ROM: Full    Dental  (+) Teeth Intact, Dental Advisory Given   Pulmonary former smoker   breath sounds clear to auscultation       Cardiovascular hypertension, Pt. on medications and Pt. on home beta blockers + Peripheral Vascular Disease  + dysrhythmias  Rhythm:Regular Rate:Normal     Neuro/Psych  PSYCHIATRIC DISORDERS Anxiety        GI/Hepatic Neg liver ROS,GERD  ,,  Endo/Other  negative endocrine ROS    Renal/GU negative Renal ROS     Musculoskeletal  (+) Arthritis , Rheumatoid disorders,    Abdominal   Peds  Hematology  (+) Blood dyscrasia, anemia   Anesthesia Other Findings   Reproductive/Obstetrics                              Anesthesia Physical Anesthesia Plan  ASA: 2  Anesthesia Plan: General   Post-op Pain Management: Tylenol  PO (pre-op)*   Induction: Intravenous  PONV Risk Score and Plan: 4 or greater and Ondansetron , Midazolam , TIVA and Dexamethasone   Airway Management Planned: Oral ETT  Additional Equipment: None  Intra-op Plan:   Post-operative Plan: Extubation in OR  Informed Consent: I have reviewed the patients History and Physical, chart, labs and discussed the procedure including the risks, benefits and alternatives for the proposed anesthesia with the patient or authorized representative who has indicated his/her understanding and acceptance.     Dental advisory given  Plan Discussed with: CRNA  Anesthesia Plan Comments: (Lab Results      Component                Value               Date                      WBC                       22.0 (H)            03/24/2024                HGB                      12.2                03/24/2024                HCT                      40.3                03/24/2024                MCV                      93.1                03/24/2024                PLT  535 (H)             03/24/2024           )         Anesthesia Quick Evaluation

## 2024-03-29 NOTE — Anesthesia Procedure Notes (Signed)
 Procedure Name: Intubation Date/Time: 03/29/2024 12:38 PM  Performed by: Nada Corean CROME, CRNAPre-anesthesia Checklist: Emergency Drugs available, Patient identified, Suction available, Patient being monitored and Timeout performed Patient Re-evaluated:Patient Re-evaluated prior to induction Oxygen  Delivery Method: Circle system utilized Preoxygenation: Pre-oxygenation with 100% oxygen  Induction Type: IV induction Ventilation: Mask ventilation without difficulty Laryngoscope Size: Mac and 3 Grade View: Grade I Tube type: Oral Tube size: 7.0 mm Number of attempts: 1 Airway Equipment and Method: Stylet Placement Confirmation: ETT inserted through vocal cords under direct vision, positive ETCO2 and breath sounds checked- equal and bilateral Secured at: 22 cm Tube secured with: Tape Dental Injury: Teeth and Oropharynx as per pre-operative assessment

## 2024-03-29 NOTE — Op Note (Signed)
 PATIENT:  Olivia [name]  68 y.o. female  PRE-OPERATIVE DIAGNOSIS:  DIVERTICULITIS  POST-OPERATIVE DIAGNOSIS:  DIVERTICULITIS  PROCEDURE:  Procedure(s): CLOSURE, ILEOSTOMY   SURGEON:  Surgeon(s): Dontel Harshberger, [name], MD  ASSISTANT: none  ANESTHESIA:   local and general  Indications for procedure: [name] is a 68 y.o. 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 She has now covered from previous surgery and presents for reversal.   Description of procedure: The patient was brought into the operative suite, placed supine. Anesthesia was administered with endotracheal tube. Patient was strapped in place and foot board was secured. All pressure points were offloaded by foam padding. The patient was prepped and draped in the usual sterile fashion.  20 ml Marcaine  was injected into the surrounding skin. Next, attention was turned to the skin portion of the case. An elliptical incision was made around the ileostomy and cautery was used to dissect and separate the surrounding tissue from the intestine. The intestine was completely mobilized. Healthy intestine was identified.  A 75mm GIA stapler was used to create a new anastomosis.  A 75 mm GIA was used to close the enterotomy defect. A 3-0 silk was used to imbricate the staple edges. The new anastomosis was then placed back into the abdomen. 10 ml Marcaine  was injected into the fascia. The fascia was closed with a running 0 PDS. A 3-0 vicryl was used to make a purse string of the deep dermal layer to tighten the skin.   The patient woke from anesthesia and was brought to PACU in stable condition. All counts were correct  Findings: intact anastomosis  Specimen: none  Blood loss: 20 ml  Local anesthesia: 30 ml Marcaine   Complications: none  PLAN OF CARE: Admit to inpatient   PATIENT DISPOSITION:  PACU - hemodynamically stable.  Images: none  Herlene Bureau, M.D. General, Bariatric, &  Minimally Invasive Surgery [facility]

## 2024-03-29 NOTE — Plan of Care (Signed)

## 2024-03-29 NOTE — Anesthesia Postprocedure Evaluation (Signed)
 Anesthesia Post Note  Patient: Bettyann Birchler  Procedure(s) Performed: CLOSURE, ILEOSTOMY (Abdomen)     Patient location during evaluation: PACU Anesthesia Type: General Level of consciousness: awake and alert Pain management: pain level controlled Vital Signs Assessment: post-procedure vital signs reviewed and stable Respiratory status: spontaneous breathing, nonlabored ventilation, respiratory function stable and patient connected to nasal cannula oxygen  Cardiovascular status: blood pressure returned to baseline and stable Postop Assessment: no apparent nausea or vomiting Anesthetic complications: no   No notable events documented.  Last Vitals:  Vitals:   03/29/24 1506 03/29/24 1635  BP: (!) 119/52 (!) 120/40  Pulse: (!) 50 (!) 56  Resp:  16  Temp:  (!) 36.3 C  SpO2: 100% 98%    Last Pain:  Vitals:   03/29/24 1527  TempSrc:   PainSc: 0-No pain                 Franky JONETTA Bald

## 2024-03-29 NOTE — Transfer of Care (Signed)
 Immediate Anesthesia Transfer of Care Note  Patient: Olivia [name]  Procedure(s) Performed: CLOSURE, ILEOSTOMY (Abdomen)  Patient Location: PACU  Anesthesia Type:General  Level of Consciousness: awake, alert , oriented, and patient cooperative  Airway & Oxygen  Therapy: Patient Spontanous Breathing and Patient connected to face mask oxygen   Post-op Assessment: Report given to RN and Post -op Vital signs reviewed and stable  Post vital signs: Reviewed and stable  Last Vitals:  Vitals Value Taken Time  BP 127/46 03/29/24 13:50  Temp    Pulse 65 03/29/24 13:51  Resp 15 03/29/24 13:51  SpO2 99 % 03/29/24 13:51  Vitals shown include unfiled device data.  Last Pain:  Vitals:   03/29/24 0950  TempSrc:   PainSc: 0-No pain         Complications: No notable events documented.

## 2024-03-30 ENCOUNTER — Encounter (HOSPITAL_COMMUNITY): Payer: Self-pay | Admitting: General Surgery

## 2024-03-30 LAB — CBC
HCT: 33.8 % — ABNORMAL LOW (ref 36.0–46.0)
Hemoglobin: 10.8 g/dL — ABNORMAL LOW (ref 12.0–15.0)
MCH: 29.9 pg (ref 26.0–34.0)
MCHC: 32 g/dL (ref 30.0–36.0)
MCV: 93.6 fL (ref 80.0–100.0)
Platelets: 427 K/uL — ABNORMAL HIGH (ref 150–400)
RBC: 3.61 MIL/uL — ABNORMAL LOW (ref 3.87–5.11)
RDW: 14.2 % (ref 11.5–15.5)
WBC: 28.8 K/uL — ABNORMAL HIGH (ref 4.0–10.5)
nRBC: 0 % (ref 0.0–0.2)

## 2024-03-30 LAB — BASIC METABOLIC PANEL WITH GFR
Anion gap: 13 (ref 5–15)
BUN: 25 mg/dL — ABNORMAL HIGH (ref 8–23)
CO2: 19 mmol/L — ABNORMAL LOW (ref 22–32)
Calcium: 8.6 mg/dL — ABNORMAL LOW (ref 8.9–10.3)
Chloride: 103 mmol/L (ref 98–111)
Creatinine, Ser: 1.07 mg/dL — ABNORMAL HIGH (ref 0.44–1.00)
GFR, Estimated: 56 mL/min — ABNORMAL LOW (ref >60)
Glucose, Bld: 108 mg/dL — ABNORMAL HIGH (ref 70–99)
Potassium: 4 mmol/L (ref 3.5–5.1)
Sodium: 135 mmol/L (ref 135–145)

## 2024-03-30 MED ORDER — PSYLLIUM 95 % PO PACK
1.0000 | PACK | Freq: Every day | ORAL | Status: DC
  Administered 2024-03-30 – 2024-03-31 (×2): 1 via ORAL
  Filled 2024-03-30 (×2): qty 1

## 2024-03-30 NOTE — Plan of Care (Signed)
 ?  Problem: Clinical Measurements: ?Goal: Will remain free from infection ?Outcome: Progressing ?  ?

## 2024-03-30 NOTE — Plan of Care (Signed)

## 2024-03-30 NOTE — Progress Notes (Signed)
  1 Day Post-Op   Chief Complaint/Subjective: Tolerating diet, +flatus, +bloody bowel movmeents  Objective: Vital signs in last 24 hours: Temp:  [97.4 F (36.3 C)-98.8 F (37.1 C)] 98.8 F (37.1 C) (09/24 0509) Pulse Rate:  [50-66] 59 (09/24 0509) Resp:  [14-18] 18 (09/24 0509) BP: (113-127)/(40-92) 115/86 (09/24 0509) SpO2:  [93 %-100 %] 98 % (09/24 0509) Last BM Date : 03/30/24 (bloody - MD aware) Intake/Output from previous day: 09/23 0701 - 09/24 0700 In: 3296.6 [P.O.:1470; I.V.:1726.6; IV Piggyback:100] Out: 1050 [Urine:1050]  PE: Gen: NAD Resp: nonlabored Card: bradycardic Abd: incision open with some seep through  Lab Results:  Recent Labs    03/29/24 1530 03/30/24 0451  WBC 17.3* 28.8*  HGB 12.2 10.8*  HCT 38.9 33.8*  PLT 519* 427*   Recent Labs    03/29/24 1530 03/30/24 0451  NA  --  135  K  --  4.0  CL  --  103  CO2  --  19*  GLUCOSE  --  108*  BUN  --  25*  CREATININE 1.20* 1.07*  CALCIUM   --  8.6*   No results for input(s): LABPROT, INR in the last 72 hours.    Component Value Date/Time   NA 135 03/30/2024 0451   NA 143 12/07/2019 0751   K 4.0 03/30/2024 0451   CL 103 03/30/2024 0451   CO2 19 (L) 03/30/2024 0451   GLUCOSE 108 (H) 03/30/2024 0451   BUN 25 (H) 03/30/2024 0451   BUN 15 12/07/2019 0751   CREATININE 1.07 (H) 03/30/2024 0451   CREATININE 0.79 12/31/2023 1027   CALCIUM  8.6 (L) 03/30/2024 0451   PROT 7.2 01/22/2024 1835   PROT 6.4 09/07/2020 1311   ALBUMIN  3.3 (L) 01/22/2024 1835   ALBUMIN  3.9 09/07/2020 1311   AST 30 01/22/2024 1835   ALT 40 01/22/2024 1835   ALKPHOS 73 01/22/2024 1835   BILITOT 1.0 01/22/2024 1835   BILITOT <0.2 09/07/2020 1311   GFRNONAA 56 (L) 03/30/2024 0451   GFRAA >60 01/03/2020 1357    Assessment/Plan  s/p Procedure(s): CLOSURE, ILEOSTOMY 03/29/2024    FEN - reg diet VTE - lovenox  ID - periop abx Disposition - inpatient, recheck WBC tomorrow   LOS: 1 day   I reviewed last 24 h  vitals and pain scores, last 48 h intake and output, last 24 h labs and trends, and last 24 h imaging results.  This care required moderate level of medical decision making.   Olivia [name] [facility] at [facility] 03/30/2024, 12:16 PM Please see Amion for pager number during day hours 7:00am-4:30pm or 7:00am -11:30am on weekends

## 2024-03-30 NOTE — Progress Notes (Signed)
   03/30/24 1038  TOC Brief Assessment  Insurance and Status Reviewed  Patient has primary care physician Yes  Home environment has been reviewed resides in a private residence  Prior level of function: Independent  Prior/Current Home Services No current home services  Social Drivers of Health Review SDOH reviewed no interventions necessary  Readmission risk has been reviewed Yes  Transition of care needs no transition of care needs at this time

## 2024-03-31 LAB — CBC
HCT: 29.4 % — ABNORMAL LOW (ref 36.0–46.0)
Hemoglobin: 9.5 g/dL — ABNORMAL LOW (ref 12.0–15.0)
MCH: 30 pg (ref 26.0–34.0)
MCHC: 32.3 g/dL (ref 30.0–36.0)
MCV: 92.7 fL (ref 80.0–100.0)
Platelets: 421 K/uL — ABNORMAL HIGH (ref 150–400)
RBC: 3.17 MIL/uL — ABNORMAL LOW (ref 3.87–5.11)
RDW: 14.6 % (ref 11.5–15.5)
WBC: 26.8 K/uL — ABNORMAL HIGH (ref 4.0–10.5)
nRBC: 0 % (ref 0.0–0.2)

## 2024-03-31 MED ORDER — TRAMADOL HCL 50 MG PO TABS: 50.0000 mg | ORAL_TABLET | Freq: Four times a day (QID) | ORAL | 0 refills | Status: DC | PRN

## 2024-03-31 NOTE — Progress Notes (Signed)
 Discharge instructions given to patient questions asked and answered.

## 2024-03-31 NOTE — Discharge Summary (Signed)
 Physician Discharge Summary  Olivia [name] FMW:[id number] DOB: [birth year] DOA: 03/29/2024  PCP: Elvis Ditch, PA-C  Admit date: 03/29/2024 Discharge date:  03/31/2024   Recommendations for Outpatient Follow-up:   (include homehealth, outpatient follow-up instructions, specific recommendations for PCP to follow-up on, etc.)   Discharge Diagnoses:  Principal Problem:   Ileostomy care [facility])   Surgical Procedure: ileostomy reversal  Discharge Condition: Good Disposition: Home  Diet recommendation: reg diet/high fiber   Hospital Course:  68 yo female underwent loop ileostomy reversal. Post op she was admitted to the surgical floor. She had flatus and loose bowel movements POD 1 some of which were bloody. Her lovenox  was held. Overnight bowel movement frequency decreased and blood resolved. Hgb POD 2 was 9.5 from 10.8 and she was discharged home. This was felt to be from the initial blood per rectum which improved POD 2. In addition, she had a leukocytosis of 28 POD 1 improving to 26.8 POD 2. She was tolerating diet, had minimal pain, and was ambulating well. She was discharged home POD 2.  Discharge Instructions  Discharge Instructions     Diet - low sodium heart healthy   Complete by: As directed    Discharge wound care:   Complete by: As directed    Daily dry bandage, ok to shower   Increase activity slowly   Complete by: As directed       Allergies as of 03/31/2024       Reactions   Codeine    Headache-draws line across forehead        Medication List     TAKE these medications    acidophilus Caps capsule Take 1 capsule by mouth daily.   amiodarone  100 MG tablet Commonly known as: PACERONE  Take 100 mg by mouth at bedtime.   amLODipine  5 MG tablet Commonly known as: NORVASC  Take 5 mg by mouth daily.   atorvastatin  80 MG tablet Commonly known as: LIPITOR Take 80 mg by mouth at bedtime.   calcium -vitamin D 500-5 MG-MCG tablet Commonly known as:  OSCAL WITH D Take 1 tablet by mouth 2 (two) times daily.   cetirizine 10 MG tablet Commonly known as: ZYRTEC Take 10 mg by mouth daily.   CINNAMON  PO Take 1 tablet by mouth daily.   [facility] Ibuprofen PM 200-38 MG Tabs Generic drug: Ibuprofen-diphenhydrAMINE  Cit Take 2 tablets by mouth at bedtime as needed (sleep).   ergocalciferol 1.25 MG (50000 UT) capsule Commonly known as: VITAMIN D2 Take 50,000 Units by mouth every Wednesday.   fluticasone  50 MCG/ACT nasal spray Commonly known as: FLONASE  Place 1 spray into both nostrils daily as needed for allergies or rhinitis.   gabapentin  300 MG capsule Commonly known as: NEURONTIN  Take 300 mg by mouth 3 (three) times daily.   glucosamine-chondroitin 500-400 MG tablet Take 1 tablet by mouth daily.   latanoprost  0.005 % ophthalmic solution Commonly known as: XALATAN  Place 1 drop into both eyes at bedtime.   MAGNESIUM  GUMMIES PO Take 2 tablets by mouth daily.   metoprolol  succinate 50 MG 24 hr tablet Commonly known as: TOPROL -XL Take 50 mg by mouth 2 (two) times daily.   oxyCODONE  5 MG immediate release tablet Commonly known as: Oxy IR/ROXICODONE  Take 1 tablet (5 mg total) by mouth every 6 (six) hours as needed for severe pain (pain score 7-10).   sodium chloride  flush 0.9 % Soln Commonly known as: NS 10 mLs by Intracatheter route as needed (flush).   sulfaSALAzine  500 MG tablet Commonly known as:  AZULFIDINE  Take 1,000 mg by mouth 2 (two) times daily.   traMADol  50 MG tablet Commonly known as: ULTRAM  Take 50 mg by mouth every 6 (six) hours as needed for moderate pain (pain score 4-6). What changed: Another medication with the same name was added. Make sure you understand how and when to take each.   traMADol  50 MG tablet Commonly known as: ULTRAM  Take 1 tablet (50 mg total) by mouth every 6 (six) hours as needed for moderate pain (pain score 4-6) or severe pain (pain score 7-10). What changed: You were already taking a  medication with the same name, and this prescription was added. Make sure you understand how and when to take each.   VITAMIN B-12 IJ Inject 1,000 mcg into the muscle once a week.   VITAMIN B 12 PO Place 3,000 mcg under the tongue daily.   Xarelto  20 MG Tabs tablet Generic drug: rivaroxaban  Take 20 mg by mouth daily.               Discharge Care Instructions  (From admission, onward)           Start     Ordered   03/31/24 0000  Discharge wound care:       Comments: Daily dry bandage, ok to shower   03/31/24 0910              The results of significant diagnostics from this hospitalization (including imaging, microbiology, ancillary and laboratory) are listed below for reference.    Significant Diagnostic Studies: No results found.  Labs: Basic Metabolic Panel: Recent Labs  Lab 03/24/24 1323 03/29/24 1530 03/30/24 0451  NA 136  --  135  K 4.1  --  4.0  CL 102  --  103  CO2 20*  --  19*  GLUCOSE 75  --  108*  BUN 19  --  25*  CREATININE 1.12* 1.20* 1.07*  CALCIUM  9.2  --  8.6*   Liver Function Tests: No results for input(s): AST, ALT, ALKPHOS, BILITOT, PROT, ALBUMIN  in the last 168 hours.  CBC: Recent Labs  Lab 03/24/24 1323 03/29/24 1530 03/30/24 0451 03/31/24 0443  WBC 22.0* 17.3* 28.8* 26.8*  HGB 12.2 12.2 10.8* 9.5*  HCT 40.3 38.9 33.8* 29.4*  MCV 93.1 94.2 93.6 92.7  PLT 535* 519* 427* 421*    CBG: No results for input(s): GLUCAP in the last 168 hours.  Principal Problem:   Ileostomy care [facility])   Time coordinating discharge: 15 min

## 2024-03-31 NOTE — Plan of Care (Signed)
 ?  Problem: Clinical Measurements: ?Goal: Ability to maintain clinical measurements within normal limits will improve ?Outcome: Progressing ?Goal: Will remain free from infection ?Outcome: Progressing ?Goal: Diagnostic test results will improve ?Outcome: Progressing ?  ?

## 2024-04-02 ENCOUNTER — Inpatient Hospital Stay (HOSPITAL_COMMUNITY)
Admission: EM | Admit: 2024-04-02 | Discharge: 2024-04-12 | DRG: 329 | Disposition: A | Discharge status: Home Health | Attending: General Surgery | Admitting: General Surgery

## 2024-04-02 ENCOUNTER — Other Ambulatory Visit: Payer: Self-pay

## 2024-04-02 ENCOUNTER — Encounter (HOSPITAL_COMMUNITY): Payer: Self-pay

## 2024-04-02 DIAGNOSIS — I10 Essential (primary) hypertension: Secondary | ICD-10-CM | POA: Diagnosis present

## 2024-04-02 DIAGNOSIS — K632 Fistula of intestine: Secondary | ICD-10-CM | POA: Diagnosis present

## 2024-04-02 DIAGNOSIS — K9189 Other postprocedural complications and disorders of digestive system: Principal | ICD-10-CM | POA: Diagnosis present

## 2024-04-02 DIAGNOSIS — I739 Peripheral vascular disease, unspecified: Secondary | ICD-10-CM | POA: Diagnosis present

## 2024-04-02 DIAGNOSIS — Y838 Other surgical procedures as the cause of abnormal reaction of the patient, or of later complication, without mention of misadventure at the time of the procedure: Secondary | ICD-10-CM | POA: Diagnosis present

## 2024-04-02 DIAGNOSIS — Z6824 Body mass index (BMI) 24.0-24.9, adult: Secondary | ICD-10-CM

## 2024-04-02 DIAGNOSIS — A419 Sepsis, unspecified organism: Secondary | ICD-10-CM | POA: Diagnosis present

## 2024-04-02 DIAGNOSIS — I480 Paroxysmal atrial fibrillation: Secondary | ICD-10-CM | POA: Diagnosis present

## 2024-04-02 DIAGNOSIS — Z885 Allergy status to narcotic agent status: Secondary | ICD-10-CM

## 2024-04-02 DIAGNOSIS — F419 Anxiety disorder, unspecified: Secondary | ICD-10-CM | POA: Diagnosis present

## 2024-04-02 DIAGNOSIS — Z9079 Acquired absence of other genital organ(s): Secondary | ICD-10-CM

## 2024-04-02 DIAGNOSIS — E440 Moderate protein-calorie malnutrition: Secondary | ICD-10-CM | POA: Insufficient documentation

## 2024-04-02 DIAGNOSIS — Z87891 Personal history of nicotine dependence: Secondary | ICD-10-CM

## 2024-04-02 DIAGNOSIS — Z90710 Acquired absence of both cervix and uterus: Secondary | ICD-10-CM

## 2024-04-02 DIAGNOSIS — K567 Ileus, unspecified: Secondary | ICD-10-CM | POA: Diagnosis not present

## 2024-04-02 DIAGNOSIS — E43 Unspecified severe protein-calorie malnutrition: Secondary | ICD-10-CM | POA: Diagnosis present

## 2024-04-02 DIAGNOSIS — K219 Gastro-esophageal reflux disease without esophagitis: Secondary | ICD-10-CM | POA: Diagnosis present

## 2024-04-02 DIAGNOSIS — E785 Hyperlipidemia, unspecified: Secondary | ICD-10-CM | POA: Diagnosis present

## 2024-04-02 DIAGNOSIS — M069 Rheumatoid arthritis, unspecified: Secondary | ICD-10-CM | POA: Diagnosis present

## 2024-04-02 DIAGNOSIS — Z9049 Acquired absence of other specified parts of digestive tract: Secondary | ICD-10-CM

## 2024-04-02 DIAGNOSIS — Z7901 Long term (current) use of anticoagulants: Secondary | ICD-10-CM

## 2024-04-02 DIAGNOSIS — Z79899 Other long term (current) drug therapy: Secondary | ICD-10-CM

## 2024-04-02 DIAGNOSIS — D62 Acute posthemorrhagic anemia: Secondary | ICD-10-CM | POA: Diagnosis not present

## 2024-04-02 DIAGNOSIS — Z90722 Acquired absence of ovaries, bilateral: Secondary | ICD-10-CM

## 2024-04-02 MED ORDER — FENTANYL CITRATE PF 50 MCG/ML IJ SOSY
50.0000 ug | PREFILLED_SYRINGE | Freq: Once | INTRAMUSCULAR | Status: AC
  Administered 2024-04-03: 50 ug via INTRAVENOUS
  Filled 2024-04-02: qty 1

## 2024-04-02 MED ORDER — ONDANSETRON HCL 4 MG/2ML IJ SOLN
4.0000 mg | Freq: Once | INTRAMUSCULAR | Status: AC
  Administered 2024-04-03: 4 mg via INTRAVENOUS
  Filled 2024-04-02: qty 2

## 2024-04-02 MED ORDER — SODIUM CHLORIDE 0.9 % IV BOLUS
1000.0000 mL | Freq: Once | INTRAVENOUS | Status: AC
  Administered 2024-04-03: 1000 mL via INTRAVENOUS

## 2024-04-02 NOTE — ED Triage Notes (Signed)
 Pt arrived from home via POV s/p colostomy reversal on Tuesday, pt now has stool flowing from previous ostomy

## 2024-04-02 NOTE — ED Provider Notes (Signed)
 [facility]ERGENCY DEPARTMENT AT [facility] Provider Note   CSN: [id number] Arrival date & time: 04/02/24  2310     Patient presents with: Post-op Problem   Olivia [name] is a 68 y.o. female.  {Add pertinent medical, surgical, social history, OB history to YEP:67052} The history is provided by the patient and medical records.   68 year old female with history of diverticulitis, dyslipidemia, GERD, paroxysmal A-fib on Xarelto , presenting to the ED with postop problem.  Patient had colostomy takedown 03/29/2024 with Dr. Patrici.  Procedure went well without any noted complications.  She was discharged on 03/31/2024.  States initially she was doing well, did have some pressure in her abdomen but felt like this was normal.  She was able to have very small bowel movements.  Today she had worsening pain and feels like she has a internal blockage.  She had a few episodes of vomiting today and later this evening noticed that she had stool coming from her abdominal incisions.  She denies any fever or chills.  Prior to Admission medications   Medication Sig Start Date End Date Taking? Authorizing Provider  acidophilus (RISAQUAD) CAPS capsule Take 1 capsule by mouth daily.    [provider]  amiodarone  (PACERONE ) 100 MG tablet Take 100 mg by mouth at bedtime.    [provider]  amLODipine  (NORVASC ) 5 MG tablet Take 5 mg by mouth daily.    [provider]  atorvastatin  (LIPITOR) 80 MG tablet Take 80 mg by mouth at bedtime.    [provider]  calcium -vitamin D (OSCAL WITH D) 500-5 MG-MCG tablet Take 1 tablet by mouth 2 (two) times daily.    [provider]  cetirizine (ZYRTEC) 10 MG tablet Take 10 mg by mouth daily.    [provider]  CINNAMON  PO Take 1 tablet by mouth daily.    [provider]  Cyanocobalamin  (VITAMIN B 12 PO) Place 3,000 mcg under the tongue daily.    [provider]  Cyanocobalamin  (VITAMIN  B-12 IJ) Inject 1,000 mcg into the muscle once a week.    [provider]  ergocalciferol (VITAMIN D2) 1.25 MG (50000 UT) capsule Take 50,000 Units by mouth every Wednesday.    [provider]  fluticasone  (FLONASE ) 50 MCG/ACT nasal spray Place 1 spray into both nostrils daily as needed for allergies or rhinitis.    [provider]  gabapentin  (NEURONTIN ) 300 MG capsule Take 300 mg by mouth 3 (three) times daily.    [provider]  glucosamine-chondroitin 500-400 MG tablet Take 1 tablet by mouth daily.    [provider]  Ibuprofen-diphenhydrAMINE  Cit ([employer] IBUPROFEN PM) 200-38 MG TABS Take 2 tablets by mouth at bedtime as needed (sleep).    [provider]  latanoprost  (XALATAN ) 0.005 % ophthalmic solution Place 1 drop into both eyes at bedtime. 12/01/23   [provider]  Magnesium  Citrate (MAGNESIUM  GUMMIES PO) Take 2 tablets by mouth daily.    [provider]  metoprolol  succinate (TOPROL -XL) 50 MG 24 hr tablet Take 50 mg by mouth 2 (two) times daily. 12/12/22   [provider]  sodium chloride  flush (NS) 0.9 % SOLN 10 mLs by Intracatheter route as needed (flush). Patient not taking: Reported on 03/18/2024 11/18/23   Jonel Lonni SQUIBB, MD  sulfaSALAzine  (AZULFIDINE ) 500 MG tablet Take 1,000 mg by mouth 2 (two) times daily.    [provider]  traMADol  (ULTRAM ) 50 MG tablet Take 1 tablet (50 mg total)  by mouth every 6 (six) hours as needed for moderate pain (pain score 4-6) or severe pain (pain score 7-10). 03/31/24   Kinsinger, [name], MD  XARELTO  20 MG TABS tablet Take 20 mg by mouth daily. 12/12/22   [provider]    Allergies: Codeine    Review of Systems  Constitutional:        Postop problem  Skin:  Positive for wound.  All other systems reviewed and are negative.   Updated Vital Signs BP 116/68   Pulse (!) 121   Temp 98.2 F (36.8 C) (Oral)   Resp 18   Ht 5' 3 (1.6 m)   Wt  63.5 kg   SpO2 97%   BMI 24.80 kg/m   Physical Exam Vitals and nursing note reviewed.  Constitutional:      Appearance: She is well-developed.  HENT:     Head: Normocephalic and atraumatic.  Eyes:     Conjunctiva/sclera: Conjunctivae normal.     Pupils: Pupils are equal, round, and reactive to light.  Cardiovascular:     Rate and Rhythm: Normal rate and regular rhythm.     Heart sounds: Normal heart sounds.  Pulmonary:     Effort: Pulmonary effort is normal.     Breath sounds: Normal breath sounds.  Abdominal:     General: Bowel sounds are normal.     Palpations: Abdomen is soft.     Comments: Abdomen appears distended, there is feculent material draining from her prior ostomy site  Musculoskeletal:        General: Normal range of motion.     Cervical back: Normal range of motion.  Skin:    General: Skin is warm and dry.  Neurological:     Mental Status: She is alert and oriented to person, place, and time.     (all labs ordered are listed, but only abnormal results are displayed) Labs Reviewed  CBC WITH DIFFERENTIAL/PLATELET  COMPREHENSIVE METABOLIC PANEL WITH GFR    EKG: None  Radiology: No results found.  {Document cardiac monitor, telemetry assessment procedure when appropriate:32947} Procedures   Medications Ordered in the ED  sodium chloride  0.9 % bolus 1,000 mL (has no administration in time range)  fentaNYL  (SUBLIMAZE ) injection 50 mcg (has no administration in time range)  ondansetron  (ZOFRAN ) injection 4 mg (has no administration in time range)      {Click here for ABCD2, HEART and other calculators REFRESH Note before signing:1}                              Medical Decision Making Amount and/or Complexity of Data Reviewed Labs: ordered. Radiology: ordered.  Risk Prescription drug management.   ***  {Document critical care time when appropriate  Document review of labs and clinical decision tools ie CHADS2VASC2, etc  Document your  independent review of radiology images and any outside records  Document your discussion with family members, caretakers and with consultants  Document social determinants of health affecting pt's care  Document your decision making why or why not admission, treatments were needed:32947:::1}   Final diagnoses:  None    ED Discharge Orders     None

## 2024-04-03 ENCOUNTER — Emergency Department (HOSPITAL_COMMUNITY)

## 2024-04-03 DIAGNOSIS — Z90722 Acquired absence of ovaries, bilateral: Secondary | ICD-10-CM | POA: Diagnosis not present

## 2024-04-03 DIAGNOSIS — Z885 Allergy status to narcotic agent status: Secondary | ICD-10-CM | POA: Diagnosis not present

## 2024-04-03 DIAGNOSIS — I10 Essential (primary) hypertension: Secondary | ICD-10-CM | POA: Diagnosis present

## 2024-04-03 DIAGNOSIS — Y838 Other surgical procedures as the cause of abnormal reaction of the patient, or of later complication, without mention of misadventure at the time of the procedure: Secondary | ICD-10-CM | POA: Diagnosis present

## 2024-04-03 DIAGNOSIS — Z7901 Long term (current) use of anticoagulants: Secondary | ICD-10-CM | POA: Diagnosis not present

## 2024-04-03 DIAGNOSIS — M069 Rheumatoid arthritis, unspecified: Secondary | ICD-10-CM | POA: Diagnosis present

## 2024-04-03 DIAGNOSIS — Z9079 Acquired absence of other genital organ(s): Secondary | ICD-10-CM | POA: Diagnosis not present

## 2024-04-03 DIAGNOSIS — Z90710 Acquired absence of both cervix and uterus: Secondary | ICD-10-CM | POA: Diagnosis not present

## 2024-04-03 DIAGNOSIS — Z87891 Personal history of nicotine dependence: Secondary | ICD-10-CM | POA: Diagnosis not present

## 2024-04-03 DIAGNOSIS — I739 Peripheral vascular disease, unspecified: Secondary | ICD-10-CM | POA: Diagnosis present

## 2024-04-03 DIAGNOSIS — A419 Sepsis, unspecified organism: Secondary | ICD-10-CM | POA: Diagnosis present

## 2024-04-03 DIAGNOSIS — K219 Gastro-esophageal reflux disease without esophagitis: Secondary | ICD-10-CM | POA: Diagnosis present

## 2024-04-03 DIAGNOSIS — E785 Hyperlipidemia, unspecified: Secondary | ICD-10-CM | POA: Diagnosis present

## 2024-04-03 DIAGNOSIS — K9189 Other postprocedural complications and disorders of digestive system: Secondary | ICD-10-CM | POA: Diagnosis present

## 2024-04-03 DIAGNOSIS — Z79899 Other long term (current) drug therapy: Secondary | ICD-10-CM | POA: Diagnosis not present

## 2024-04-03 DIAGNOSIS — F419 Anxiety disorder, unspecified: Secondary | ICD-10-CM | POA: Diagnosis present

## 2024-04-03 DIAGNOSIS — E43 Unspecified severe protein-calorie malnutrition: Secondary | ICD-10-CM | POA: Diagnosis present

## 2024-04-03 DIAGNOSIS — K632 Fistula of intestine: Secondary | ICD-10-CM | POA: Diagnosis present

## 2024-04-03 DIAGNOSIS — D62 Acute posthemorrhagic anemia: Secondary | ICD-10-CM | POA: Diagnosis not present

## 2024-04-03 DIAGNOSIS — Z9049 Acquired absence of other specified parts of digestive tract: Secondary | ICD-10-CM | POA: Diagnosis not present

## 2024-04-03 DIAGNOSIS — Z6824 Body mass index (BMI) 24.0-24.9, adult: Secondary | ICD-10-CM | POA: Diagnosis not present

## 2024-04-03 DIAGNOSIS — K567 Ileus, unspecified: Secondary | ICD-10-CM | POA: Diagnosis not present

## 2024-04-03 DIAGNOSIS — I480 Paroxysmal atrial fibrillation: Secondary | ICD-10-CM | POA: Diagnosis present

## 2024-04-03 LAB — CBC WITH DIFFERENTIAL/PLATELET
Abs Immature Granulocytes: 0.22 K/uL — ABNORMAL HIGH (ref 0.00–0.07)
Basophils Absolute: 0 K/uL (ref 0.0–0.1)
Basophils Relative: 0 %
Eosinophils Absolute: 0 K/uL (ref 0.0–0.5)
Eosinophils Relative: 0 %
HCT: 34.5 % — ABNORMAL LOW (ref 36.0–46.0)
Hemoglobin: 11.1 g/dL — ABNORMAL LOW (ref 12.0–15.0)
Immature Granulocytes: 1 %
Lymphocytes Relative: 9 %
Lymphs Abs: 2 K/uL (ref 0.7–4.0)
MCH: 29.4 pg (ref 26.0–34.0)
MCHC: 32.2 g/dL (ref 30.0–36.0)
MCV: 91.5 fL (ref 80.0–100.0)
Monocytes Absolute: 2.2 K/uL — ABNORMAL HIGH (ref 0.1–1.0)
Monocytes Relative: 9 %
Neutro Abs: 18.5 K/uL — ABNORMAL HIGH (ref 1.7–7.7)
Neutrophils Relative %: 81 %
Platelets: 470 K/uL — ABNORMAL HIGH (ref 150–400)
RBC: 3.77 MIL/uL — ABNORMAL LOW (ref 3.87–5.11)
RDW: 14.8 % (ref 11.5–15.5)
WBC: 22.9 K/uL — ABNORMAL HIGH (ref 4.0–10.5)
nRBC: 0.1 % (ref 0.0–0.2)

## 2024-04-03 LAB — COMPREHENSIVE METABOLIC PANEL WITH GFR
ALT: 58 U/L — ABNORMAL HIGH (ref 0–44)
AST: 33 U/L (ref 15–41)
Albumin: 3.5 g/dL (ref 3.5–5.0)
Alkaline Phosphatase: 82 U/L (ref 38–126)
Anion gap: 16 — ABNORMAL HIGH (ref 5–15)
BUN: 18 mg/dL (ref 8–23)
CO2: 25 mmol/L (ref 22–32)
Calcium: 9.7 mg/dL (ref 8.9–10.3)
Chloride: 95 mmol/L — ABNORMAL LOW (ref 98–111)
Creatinine, Ser: 1.23 mg/dL — ABNORMAL HIGH (ref 0.44–1.00)
GFR, Estimated: 47 mL/min — ABNORMAL LOW (ref >60)
Glucose, Bld: 113 mg/dL — ABNORMAL HIGH (ref 70–99)
Potassium: 3.9 mmol/L (ref 3.5–5.1)
Sodium: 136 mmol/L (ref 135–145)
Total Bilirubin: 0.6 mg/dL (ref 0.0–1.2)
Total Protein: 6.5 g/dL (ref 6.5–8.1)

## 2024-04-03 LAB — HIV ANTIBODY (ROUTINE TESTING W REFLEX): HIV Screen 4th Generation wRfx: NONREACTIVE

## 2024-04-03 MED ORDER — PANTOPRAZOLE SODIUM 40 MG IV SOLR
40.0000 mg | Freq: Every day | INTRAVENOUS | Status: DC
  Administered 2024-04-03 – 2024-04-10 (×8): 40 mg via INTRAVENOUS
  Filled 2024-04-03 (×8): qty 10

## 2024-04-03 MED ORDER — HYDROMORPHONE HCL 1 MG/ML IJ SOLN
1.0000 mg | INTRAMUSCULAR | Status: DC | PRN
  Administered 2024-04-03 – 2024-04-11 (×29): 1 mg via INTRAVENOUS
  Filled 2024-04-03 (×29): qty 1

## 2024-04-03 MED ORDER — IOHEXOL 300 MG/ML  SOLN
100.0000 mL | Freq: Once | INTRAMUSCULAR | Status: AC | PRN
  Administered 2024-04-03: 100 mL via INTRAVENOUS

## 2024-04-03 MED ORDER — METOPROLOL TARTRATE 5 MG/5ML IV SOLN: 5.0000 mg | Freq: Four times a day (QID) | INTRAVENOUS | Status: DC | PRN

## 2024-04-03 MED ORDER — IOHEXOL 9 MG/ML PO SOLN
500.0000 mL | ORAL | Status: AC
  Administered 2024-04-03 (×2): 500 mL via ORAL

## 2024-04-03 MED ORDER — AMLODIPINE BESYLATE 5 MG PO TABS
5.0000 mg | ORAL_TABLET | Freq: Every day | ORAL | Status: DC
  Administered 2024-04-03 – 2024-04-12 (×7): 5 mg via ORAL
  Filled 2024-04-03 (×8): qty 1

## 2024-04-03 MED ORDER — INFLUENZA VAC SPLIT HIGH-DOSE 0.5 ML IM SUSY
0.5000 mL | PREFILLED_SYRINGE | INTRAMUSCULAR | Status: DC
  Filled 2024-04-03: qty 0.5

## 2024-04-03 MED ORDER — PIPERACILLIN-TAZOBACTAM 3.375 G IVPB 30 MIN
3.3750 g | Freq: Once | INTRAVENOUS | Status: AC
  Administered 2024-04-03: 3.375 g via INTRAVENOUS
  Filled 2024-04-03: qty 50

## 2024-04-03 MED ORDER — ACETAMINOPHEN 650 MG RE SUPP: 650.0000 mg | Freq: Four times a day (QID) | RECTAL | Status: DC | PRN

## 2024-04-03 MED ORDER — METOPROLOL SUCCINATE ER 50 MG PO TB24
50.0000 mg | ORAL_TABLET | Freq: Two times a day (BID) | ORAL | Status: DC
  Administered 2024-04-04 – 2024-04-12 (×15): 50 mg via ORAL
  Filled 2024-04-03 (×17): qty 1

## 2024-04-03 MED ORDER — PIPERACILLIN-TAZOBACTAM 3.375 G IVPB
3.3750 g | Freq: Three times a day (TID) | INTRAVENOUS | Status: DC
  Administered 2024-04-03 – 2024-04-08 (×15): 3.375 g via INTRAVENOUS
  Filled 2024-04-03 (×15): qty 50

## 2024-04-03 MED ORDER — HYDROMORPHONE HCL 1 MG/ML IJ SOLN: 0.5000 mg | INTRAMUSCULAR | Status: DC | PRN

## 2024-04-03 MED ORDER — AMIODARONE HCL 200 MG PO TABS
100.0000 mg | ORAL_TABLET | Freq: Every day | ORAL | Status: DC
  Administered 2024-04-03 – 2024-04-11 (×9): 100 mg via ORAL
  Filled 2024-04-03 (×9): qty 1

## 2024-04-03 MED ORDER — ACETAMINOPHEN 325 MG PO TABS: 650.0000 mg | ORAL_TABLET | Freq: Four times a day (QID) | ORAL | Status: DC | PRN

## 2024-04-03 MED ORDER — DEXTROSE-SODIUM CHLORIDE 5-0.9 % IV SOLN: INTRAVENOUS | Status: DC

## 2024-04-03 MED ORDER — ONDANSETRON HCL 4 MG/2ML IJ SOLN
4.0000 mg | Freq: Four times a day (QID) | INTRAMUSCULAR | Status: DC | PRN
  Administered 2024-04-04 – 2024-04-11 (×4): 4 mg via INTRAVENOUS
  Filled 2024-04-03 (×4): qty 2

## 2024-04-03 MED ORDER — ENOXAPARIN SODIUM 40 MG/0.4ML IJ SOSY
40.0000 mg | PREFILLED_SYRINGE | Freq: Every day | INTRAMUSCULAR | Status: DC
  Administered 2024-04-03 – 2024-04-11 (×9): 40 mg via SUBCUTANEOUS
  Filled 2024-04-03 (×9): qty 0.4

## 2024-04-03 MED ORDER — ONDANSETRON 4 MG PO TBDP: 4.0000 mg | ORAL_TABLET | Freq: Four times a day (QID) | ORAL | Status: DC | PRN

## 2024-04-03 NOTE — ED Notes (Signed)
 Pt ambulated to the restroom.

## 2024-04-03 NOTE — ED Notes (Signed)
 Paged Cornett, MD (General Surgery)

## 2024-04-03 NOTE — H&P (Signed)
 Olivia [name] is an 68 y.o. female.   Chief Complaint: Stool draining from incision HPI: Pleasant 68 year old female 5 days status post ileostomy takedown by Dr. Stevie after previous colon resection for diverticulitis.  Her postop course was uneventful.  She was discharged home.  Yesterday, she noticed green drainage from the previous ileostomy site where her takedown was performed.  She comes to the emergency room this morning with green discharge and actually now has a wound manager over the wound since there were copious amounts of drainage.  She is hemodynamically stable.  CT scan was reviewed which shows what appears to be anastomotic disruption with fistulization to the previous incision.  No free air.  Minimal and significant free fluid noted.  Mostly complains of discharge less so abdominal pain.  Past Medical History:  Diagnosis Date   Anemia    Anxiety    Diverticulitis    Dyslipidemia    Dysrhythmia    A.fib   GERD (gastroesophageal reflux disease)    Hypertension    PAD (peripheral artery disease)    PONV (postoperative nausea and vomiting)    Psoriasis    RA (rheumatoid arthritis) (HCC)    Thrombocytosis    Vertigo     Past Surgical History:  Procedure Laterality Date   ANEURYSM COILING Right    right lower leg   BILIARY BRUSHING  05/16/2020   Procedure: BILIARY BRUSHING;  Surgeon: Golda Claudis PENNER, MD;  [facility]: [facility];  Service: Endoscopy;;   BILIARY BRUSHING  07/26/2020   Procedure: BILIARY BRUSHING;  Surgeon: Teressa Toribio SQUIBB, MD;  [facility];  Service: Endoscopy;;   BILIARY STENT PLACEMENT  01/05/2020   Procedure: BILIARY STENT PLACEMENT;  Surgeon: Golda Claudis PENNER, MD;  [facility];  Service: Endoscopy;;   BILIARY STENT PLACEMENT  05/16/2020   Procedure: BILIARY STENT PLACEMENT;  Surgeon: Golda Claudis PENNER, MD;  [facility]: [facility];  Service: Endoscopy;;   CHOLECYSTECTOMY  2005   done during bowel surgery   COLECTOMY, SIGMOID,  ROBOT-ASSISTED N/A 01/12/2024   Procedure: COLECTOMY, SIGMOID, ROBOT-ASSISTED;  Surgeon: Stevie [name], MD;  [facility];  Service: General;  Laterality: N/A;  ROBOTIC SIGMOID RESECTION WITH ANASTOMOSIS   COLONOSCOPY  03/07/2015   Propofol ; Surgeon: Dr. Petel; Three 6 mm rectosigmoid polyps, moderate left-sided diverticulosis, tortuous rectosigmoid colon. Pathology with inflamed hyperplastic polyp.   COLONOSCOPY WITH PROPOFOL  N/A 08/18/2019   non-bleeding internal hemorrhoids, pancolonic diverticulosis, two 4-7 mm polyps in rectum benign, no adenomas. Next colonoscopy in 10 years.    CYSTOSCOPY WITH INDOCYANINE GREEN  IMAGING (ICG) N/A 01/12/2024   Procedure: CYSTOSCOPY WITH INDOCYANINE GREEN  IMAGING (ICG);  Surgeon: Elisabeth Valli BIRCH, MD;  [facility];  Service: Urology;  Laterality: N/A;  CYSTO WITH FIREFLY   ENDOSCOPIC RETROGRADE CHOLANGIOPANCREATOGRAPHY (ERCP) WITH PROPOFOL  N/A 07/26/2020   Procedure: ENDOSCOPIC RETROGRADE CHOLANGIOPANCREATOGRAPHY (ERCP) WITH PROPOFOL ;  Surgeon: Teressa Toribio SQUIBB, MD;  [facility];  Service: Endoscopy;  Laterality: N/A;   ERCP N/A 01/05/2020   Procedure: ENDOSCOPIC RETROGRADE CHOLANGIOPANCREATOGRAPHY (ERCP);  Surgeon: Golda Claudis PENNER, MD;  [facility];  Service: Endoscopy;  Laterality: N/A;   ERCP N/A 05/16/2020   Procedure: ENDOSCOPIC RETROGRADE CHOLANGIOPANCREATOGRAPHY (ERCP);  Surgeon: Golda Claudis PENNER, MD;  [facility];  Service: Endoscopy;  Laterality: N/A;   ESOPHAGOGASTRODUODENOSCOPY (EGD) WITH PROPOFOL  N/A 07/26/2020   Procedure: ESOPHAGOGASTRODUODENOSCOPY (EGD) WITH PROPOFOL ;  Surgeon: Teressa Toribio SQUIBB, MD;  [facility];  Service: Endoscopy;  Laterality: N/A;   GASTROINTESTINAL STENT  REMOVAL N/A 05/16/2020   Procedure: STENT REMOVAL;  Surgeon: Golda Claudis PENNER, MD;  [facility];  Service: Endoscopy;  Laterality: N/A;   HYSTERECTOMY ABDOMINAL WITH SALPINGO-OOPHORECTOMY     ILEO LOOP DIVERSION  Right 01/12/2024   Procedure: CREATION, ILEOSTOMY, LOOP;  Surgeon: Kinsinger, [name], MD;  [facility];  Service: General;  Laterality: Right;  CREATION LOOP ILEOSTOMY   ILEOSTOMY  2005   per patient for bowel obstruction and peritonitis with anastomosis   ILEOSTOMY CLOSURE N/A 03/29/2024   Procedure: CLOSURE, ILEOSTOMY;  Surgeon: Stevie [name], MD;  [facility];  Service: General;  Laterality: N/A;  ILEOSTOMY REVERSAL   INCISION AND DRAINAGE ABSCESS N/A 01/12/2024   Procedure: INCISION AND DRAINAGE, INTRA-ABDOMINAL ABSCESS;  Surgeon: Kinsinger, [name], MD;  [facility];  Service: General;  Laterality: N/A;  DRAINAGE INTRA-ABDOMINAL ABCESS   INSERTION OF MESH     RLQ   IR RADIOLOGIST EVAL & MGMT  11/25/2023   LIGATION OF INTERNAL FISTULA TRACT N/A 01/12/2024   Procedure: LIGATION, INTERNAL FISTULA TRACT;  Surgeon: Stevie, [name], MD;  [facility];  Service: General;  Laterality: N/A;  TAKEDOWN OF COLOVESICAL FISTULA   POLYPECTOMY  08/18/2019   Procedure: POLYPECTOMY;  Surgeon: Shaaron Lamar HERO, MD;  [facility];  Service: Endoscopy;;   REMOVAL OF STONES N/A 01/05/2020   Procedure: REMOVAL OF STONES;  Surgeon: Golda Claudis PENNER, MD;  [facility];  Service: Endoscopy;  Laterality: N/A;   ROBOTIC ASSISTED LAPAROSCOPIC LYSIS OF ADHESION N/A 01/12/2024   Procedure: LYSIS, ADHESIONS, ROBOT-ASSISTED, LAPAROSCOPIC;  Surgeon: Kinsinger, [name], MD;  [facility];  Service: General;  Laterality: N/A;  LYSIS OF ADHESIONS, ABDOMEN   SPHINCTEROTOMY N/A 01/05/2020   Procedure: SPHINCTEROTOMY;  Surgeon: Golda Claudis PENNER, MD;  [facility];  Service: Endoscopy;  Laterality: N/A;   SPHINCTEROTOMY N/A 05/16/2020   Procedure: SPHINCTEROTOMY;  Surgeon: Golda Claudis PENNER, MD;  [facility];  Service: Endoscopy;  Laterality: N/A;   SPYGLASS CHOLANGIOSCOPY N/A 07/26/2020   Procedure: DEBHOJDD CHOLANGIOSCOPY;  Surgeon: Teressa Toribio SQUIBB,  MD;  [facility];  Service: Endoscopy;  Laterality: N/A;   STENT REMOVAL  07/26/2020   Procedure: STENT REMOVAL;  Surgeon: Teressa Toribio SQUIBB, MD;  [facility];  Service: Endoscopy;;   UPPER ESOPHAGEAL ENDOSCOPIC ULTRASOUND (EUS) N/A 07/26/2020   Procedure: UPPER ESOPHAGEAL ENDOSCOPIC ULTRASOUND (EUS);  Surgeon: Teressa Toribio SQUIBB, MD;  Location: [facility];  Service: Endoscopy;  Laterality: N/A;    Family History  Problem Relation Age of Onset   Lung cancer Mother    Lung cancer Father    Stomach cancer Brother    Breast cancer Cousin    Colon cancer Neg Hx    Social History:  reports that she quit smoking about 5 years ago. Her smoking use included cigarettes. She started smoking about 55 years ago. She has a 25 pack-year smoking history. She has never used smokeless tobacco. She reports that she does not currently use alcohol. She reports that she does not use drugs.  Allergies:  Allergies  Allergen Reactions   Codeine     Headache-draws line across forehead    (Not in a hospital admission)   Results for orders placed or performed during the hospital encounter of 04/02/24 (from the past 48 hours)  CBC with Differential     Status: Abnormal   Collection Time: 04/03/24 12:09 AM  Result Value Ref Range   WBC 22.9 (  H) 4.0 - 10.5 K/uL   RBC 3.77 (L) 3.87 - 5.11 MIL/uL   Hemoglobin 11.1 (L) 12.0 - 15.0 g/dL   HCT 65.4 (L) 63.9 - 53.9 %   MCV 91.5 80.0 - 100.0 fL   MCH 29.4 26.0 - 34.0 pg   MCHC 32.2 30.0 - 36.0 g/dL   RDW 85.1 88.4 - 84.4 %   Platelets 470 (H) 150 - 400 K/uL   nRBC 0.1 0.0 - 0.2 %   Neutrophils Relative % 81 %   Neutro Abs 18.5 (H) 1.7 - 7.7 K/uL   Lymphocytes Relative 9 %   Lymphs Abs 2.0 0.7 - 4.0 K/uL   Monocytes Relative 9 %   Monocytes Absolute 2.2 (H) 0.1 - 1.0 K/uL   Eosinophils Relative 0 %   Eosinophils Absolute 0.0 0.0 - 0.5 K/uL   Basophils Relative 0 %   Basophils Absolute 0.0 0.0 - 0.1 K/uL   Immature Granulocytes 1 %    Abs Immature Granulocytes 0.22 (H) 0.00 - 0.07 K/uL    Comment: Performed at [facility], [address] [place], [place] [address]  Comprehensive metabolic panel     Status: Abnormal   Collection Time: 04/03/24 12:09 AM  Result Value Ref Range   Sodium 136 135 - 145 mmol/L   Potassium 3.9 3.5 - 5.1 mmol/L   Chloride 95 (L) 98 - 111 mmol/L   CO2 25 22 - 32 mmol/L   Glucose, Bld 113 (H) 70 - 99 mg/dL    Comment: Glucose reference range applies only to samples taken after fasting for at least 8 hours.   BUN 18 8 - 23 mg/dL   Creatinine, Ser 8.76 (H) 0.44 - 1.00 mg/dL   Calcium  9.7 8.9 - 10.3 mg/dL   Total Protein 6.5 6.5 - 8.1 g/dL   Albumin  3.5 3.5 - 5.0 g/dL   AST 33 15 - 41 U/L   ALT 58 (H) 0 - 44 U/L   Alkaline Phosphatase 82 38 - 126 U/L   Total Bilirubin 0.6 0.0 - 1.2 mg/dL   GFR, Estimated 47 (L) >60 mL/min    Comment: (NOTE) Calculated using the CKD-EPI Creatinine Equation (2021)    Anion gap 16 (H) 5 - 15    Comment: Performed at [facility], [address], [place], [place] [address]   CT ABDOMEN PELVIS WO CONTRAST Result Date: 04/03/2024 CLINICAL DATA:  68 year old female with abdominal pain. History of colostomy reversal with fluid collection and architectural distortion in the peritoneal cavity at the exiting ostomy loop. Follow-up exam with oral contrast. EXAM: CT ABDOMEN AND PELVIS WITHOUT CONTRAST TECHNIQUE: Multidetector CT imaging of the abdomen and pelvis was performed following the standard protocol without IV contrast. RADIATION DOSE REDUCTION: This exam was performed according to the departmental dose-optimization program which includes automated exposure control, adjustment of the mA and/or kV according to patient size and/or use of iterative reconstruction technique. COMPARISON:  CT Abdomen and Pelvis with IV contrast 0159 hours today. FINDINGS: Lower chest: Trace pleural fluid and patchy, irregular lung base opacity  unchanged from earlier. No significant pericardial effusion. Hepatobiliary: Stable pneumobilia. Perihepatic free fluid along the right lobe of the liver appears more hyperdense now, up to 42 Hounsfield units which is an increase of up to 30 Hounsfield units from earlier today. Liver parenchyma appears stable. Gallbladder surgically absent. Pancreas: Stable, negative. Spleen: Stable and diminutive. Adrenals/Urinary Tract: Stable, negative adrenal glands. Nonobstructed kidneys. Diminutive ureters. Oral contrast now within the  urinary bladder. Pelvic phleboliths. Stomach/Bowel: Increasingly fluid distended, dilated small bowel loops throughout the abdomen. Oral contrast was administered. The leading edge of oral contrast is in the left abdominal loop seen on coronal image 56. Upstream of that loop dilated fluid containing nonenhancing small bowel loops converge on the ostomy site where there seems to gradual transition to nondilated loops approaching the ostomy. And mild regional architectural distortion better demonstrated on the earlier exam. Stomach and duodenum also demonstrate contrast in the lumen. Multiple duodenal diverticula are identified including that on series 2, image 24. No pneumoperitoneum is identified. Postoperative changes the ventral abdominal wall with mesh. Trace subcutaneous gas near the ostomy loop. The ostomy itself appears completely decompressed. Decompressed large bowel from the distal transverse colon to the rectum with descending diverticulosis. Transverse colon mild redundancy, retained gas and stool. Similar retained gas and stool in the right colon. Unchanged appearance of retrocecal appendix on series 2, image 54. Vascular/Lymphatic: Extensive Aortoiliac calcified atherosclerosis. Normal caliber abdominal aorta. No lymphadenopathy identified. Reproductive: Surgically absent. Other: Pelvic inlet free fluid (series 2, image 71) appears slightly increased in density from earlier (about 10  Hounsfield units greater now up to 36 Hounsfield units). Musculoskeletal: Stable.  No acute osseous abnormality identified. IMPRESSION: 1. Oral contrast administered with appearance now of Small-bowel Obstruction; oral contrast not reaching the ostomy site and with a gradual transition from dilated to more decompressed loops in the region of ostomy and mesenteric postoperative changes subjacent to the abdominal wall. 2. Small volume of free fluid in the abdomen and pelvis appears increased in density vs CT at 0159 hours today, raising the possibility of bowel leak - although NO Pneumoperitoneum to confirm a leak or perforation. Small volume hemoperitoneum is the main differential consideration. 3. Otherwise stable CT appearance of the abdomen, pelvis, lung bases from earlier today. Aortic Atherosclerosis (ICD10-I70.0). Electronically Signed   By: VEAR Hurst M.D.   On: 04/03/2024 06:12   CT ABDOMEN PELVIS W CONTRAST Result Date: 04/03/2024 CLINICAL DATA:  Abdominal pain, postop resected colostomy reversal EXAM: CT ABDOMEN AND PELVIS WITH CONTRAST TECHNIQUE: Multidetector CT imaging of the abdomen and pelvis was performed using the standard protocol following bolus administration of intravenous contrast. RADIATION DOSE REDUCTION: This exam was performed according to the departmental dose-optimization program which includes automated exposure control, adjustment of the mA and/or kV according to patient size and/or use of iterative reconstruction technique. CONTRAST:  OMNIPAQUE  IOHEXOL  300 MG/ML  SOLN COMPARISON:  01/22/2024 FINDINGS: Lower chest: Bibasilar atelectasis.  No effusions. Hepatobiliary: Pneumobilia again noted. No focal hepatic abnormality. Pancreas: No focal abnormality or ductal dilatation. Spleen: No focal abnormality.  Normal size. Adrenals/Urinary Tract: Scattered bilateral renal cysts which appear benign. No follow-up imaging recommended. Punctate nonobstructing stone in the midpole of the left  kidney. No hydronephrosis. Adrenal glands and urinary bladder unremarkable. Stomach/Bowel: Stomach is unremarkable. 2.7 cm diverticulum noted laterally off the 1st portion of the duodenum, stable since prior study. Postoperative changes are noted in the right abdomen from cost may reversal. There are dilated fluid-filled small bowel loops in the lower abdomen and into the pelvis. Cannot exclude small bowel obstruction. Large bowel grossly unremarkable. Vascular/Lymphatic: Aortic atherosclerosis. No evidence of aneurysm or adenopathy. Reproductive: Prior hysterectomy.  No adnexal masses. Other: Free fluid in the pelvis and adjacent to the liver. Small irregular fluid collection noted in the right abdomen anteriorly at the postoperative site. This measures approximately 3.1 x 2.6 cm on image 45. Musculoskeletal: No acute bony abnormality. IMPRESSION:  Postoperative changes from colostomy reversal. Small fluid collection noted in the anterior abdomen at the prior ostomy level measuring 3.1 x 2.6 cm. This could reflect a small postoperative fluid collection. Early abscess cannot be excluded. Dilated small bowel loops into the pelvis. Terminal ileum is decompressed. Cannot exclude distal small bowel obstruction. Punctate left nephrolithiasis.  No hydronephrosis. Small amount of free fluid in the abdomen and pelvis. Electronically Signed   By: Franky Crease M.D.   On: 04/03/2024 02:24    Review of Systems  Constitutional:  Positive for fatigue. Negative for fever.  HENT: Negative.    Respiratory: Negative.    Cardiovascular: Negative.   Gastrointestinal:  Positive for abdominal distention.  Genitourinary: Negative.   Musculoskeletal: Negative.   Skin: Negative.   Allergic/Immunologic: Negative.   Neurological: Negative.   Hematological:  Bruises/bleeds easily.  Psychiatric/Behavioral: Negative.      Blood pressure (!) 116/54, pulse 78, temperature 98.7 F (37.1 C), temperature source Oral, resp. rate 18,  height 5' 3 (1.6 m), weight 63.5 kg, SpO2 97%. Physical Exam Vitals and nursing note reviewed. Exam conducted with a chaperone present.  HENT:     Head: Normocephalic.  Cardiovascular:     Rate and Rhythm: Normal rate.  Pulmonary:     Effort: Pulmonary effort is normal.  Abdominal:      Comments: Wound manager in place with what appears to be succus entericus from the wound.  Mild distention  No peritonitis.  No abdominal wall crepitance or cellulitis.  Musculoskeletal:        General: Normal range of motion.  Skin:    General: Skin is warm.  Neurological:     General: No focal deficit present.     Mental Status: She is alert.  Psychiatric:        Mood and Affect: Mood normal.      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  She does not have peritonitis but this will need to be revised this week.  Will place in telemetry for now for monitoring to make sure she has no significant hemodynamic compromise.  Will let Dr. Patrici know she is in the hospital.  I discussed her care with her and her husband the bedside today and as long as she remains stable, this can be done in a semiurgent fashion.  I did explain that if her condition were to deteriorate, she would require operative intervention sooner.  I will keep her n.p.o. except for ice chips and her medications for now.  Continue to hold Xarelto .  All questions were answered.  Debby DELENA Shipper, MD 04/03/2024, 8:17 AM

## 2024-04-03 NOTE — Plan of Care (Signed)

## 2024-04-04 ENCOUNTER — Encounter (HOSPITAL_COMMUNITY)
Admission: EM | Disposition: A | Discharge status: Home Health | Payer: Self-pay | Source: Home / Self Care | Attending: General Surgery

## 2024-04-04 ENCOUNTER — Inpatient Hospital Stay (HOSPITAL_COMMUNITY): Admitting: Certified Registered Nurse Anesthetist

## 2024-04-04 DIAGNOSIS — I480 Paroxysmal atrial fibrillation: Secondary | ICD-10-CM

## 2024-04-04 DIAGNOSIS — I10 Essential (primary) hypertension: Secondary | ICD-10-CM

## 2024-04-04 DIAGNOSIS — Z87891 Personal history of nicotine dependence: Secondary | ICD-10-CM

## 2024-04-04 DIAGNOSIS — K9189 Other postprocedural complications and disorders of digestive system: Secondary | ICD-10-CM

## 2024-04-04 HISTORY — PX: LAPAROTOMY: SHX154

## 2024-04-04 LAB — CBC
HCT: 27.5 % — ABNORMAL LOW (ref 36.0–46.0)
HCT: 26.3 % — ABNORMAL LOW (ref 36.0–46.0)
Hemoglobin: 8.5 g/dL — ABNORMAL LOW (ref 12.0–15.0)
Hemoglobin: 7.9 g/dL — ABNORMAL LOW (ref 12.0–15.0)
MCH: 29.4 pg (ref 26.0–34.0)
MCH: 28.8 pg (ref 26.0–34.0)
MCHC: 30.9 g/dL (ref 30.0–36.0)
MCHC: 30 g/dL (ref 30.0–36.0)
MCV: 95.2 fL (ref 80.0–100.0)
MCV: 96 fL (ref 80.0–100.0)
Platelets: 388 K/uL (ref 150–400)
Platelets: 331 K/uL (ref 150–400)
RBC: 2.89 MIL/uL — ABNORMAL LOW (ref 3.87–5.11)
RBC: 2.74 MIL/uL — ABNORMAL LOW (ref 3.87–5.11)
RDW: 14.7 % (ref 11.5–15.5)
RDW: 14.7 % (ref 11.5–15.5)
WBC: 10.5 K/uL (ref 4.0–10.5)
WBC: 2.2 K/uL — ABNORMAL LOW (ref 4.0–10.5)
nRBC: 0 % (ref 0.0–0.2)
nRBC: 0 % (ref 0.0–0.2)

## 2024-04-04 LAB — COMPREHENSIVE METABOLIC PANEL WITH GFR
ALT: 39 U/L (ref 0–44)
AST: 20 U/L (ref 15–41)
Albumin: 2.8 g/dL — ABNORMAL LOW (ref 3.5–5.0)
Alkaline Phosphatase: 76 U/L (ref 38–126)
Anion gap: 11 (ref 5–15)
BUN: 11 mg/dL (ref 8–23)
CO2: 22 mmol/L (ref 22–32)
Calcium: 8.5 mg/dL — ABNORMAL LOW (ref 8.9–10.3)
Chloride: 102 mmol/L (ref 98–111)
Creatinine, Ser: 0.95 mg/dL (ref 0.44–1.00)
GFR, Estimated: >60 mL/min (ref >60)
Glucose, Bld: 109 mg/dL — ABNORMAL HIGH (ref 70–99)
Potassium: 3.5 mmol/L (ref 3.5–5.1)
Sodium: 134 mmol/L — ABNORMAL LOW (ref 135–145)
Total Bilirubin: 0.4 mg/dL (ref 0.0–1.2)
Total Protein: 5 g/dL — ABNORMAL LOW (ref 6.5–8.1)

## 2024-04-04 LAB — POCT I-STAT 7, (LYTES, BLD GAS, ICA,H+H)
Acid-base deficit: 3 mmol/L — ABNORMAL HIGH (ref 0.0–2.0)
Bicarbonate: 21.7 mmol/L (ref 20.0–28.0)
Calcium, Ion: 1.21 mmol/L (ref 1.15–1.40)
HCT: 21 % — ABNORMAL LOW (ref 36.0–46.0)
Hemoglobin: 7.1 g/dL — ABNORMAL LOW (ref 12.0–15.0)
O2 Saturation: 96 %
Patient temperature: 36
Potassium: 3.5 mmol/L (ref 3.5–5.1)
Sodium: 137 mmol/L (ref 135–145)
TCO2: 23 mmol/L (ref 22–32)
pCO2 arterial: 35 mmHg (ref 32–48)
pH, Arterial: 7.396 (ref 7.35–7.45)
pO2, Arterial: 78 mmHg — ABNORMAL LOW (ref 83–108)

## 2024-04-04 LAB — PREPARE RBC (CROSSMATCH)

## 2024-04-04 SURGERY — LAPAROTOMY, EXPLORATORY
Anesthesia: General

## 2024-04-04 MED ORDER — OXYCODONE HCL 5 MG/5ML PO SOLN: 5.0000 mg | Freq: Once | ORAL | Status: DC | PRN

## 2024-04-04 MED ORDER — FENTANYL CITRATE (PF) 100 MCG/2ML IJ SOLN
INTRAMUSCULAR | Status: AC
  Filled 2024-04-04: qty 2

## 2024-04-04 MED ORDER — DROPERIDOL 2.5 MG/ML IJ SOLN: 0.6250 mg | Freq: Once | INTRAMUSCULAR | Status: DC | PRN

## 2024-04-04 MED ORDER — PROPOFOL 1000 MG/100ML IV EMUL
INTRAVENOUS | Status: AC
Start: 2024-04-04 — End: 2024-04-04
  Filled 2024-04-04: qty 100

## 2024-04-04 MED ORDER — ONDANSETRON HCL 4 MG/2ML IJ SOLN: 4.0000 mg | Freq: Once | INTRAMUSCULAR | Status: DC | PRN

## 2024-04-04 MED ORDER — 0.9 % SODIUM CHLORIDE (POUR BTL) OPTIME
TOPICAL | Status: DC | PRN
  Administered 2024-04-04: 2000 mL
  Administered 2024-04-04: 1000 mL

## 2024-04-04 MED ORDER — ONDANSETRON HCL 4 MG/2ML IJ SOLN
INTRAMUSCULAR | Status: AC
  Filled 2024-04-04: qty 2

## 2024-04-04 MED ORDER — INSULIN ASPART 100 UNIT/ML IJ SOLN: 0.0000 [IU] | Freq: Three times a day (TID) | INTRAMUSCULAR | Status: DC

## 2024-04-04 MED ORDER — FENTANYL CITRATE PF 50 MCG/ML IJ SOSY
PREFILLED_SYRINGE | INTRAMUSCULAR | Status: AC
  Filled 2024-04-04: qty 1

## 2024-04-04 MED ORDER — LACTATED RINGERS IV SOLN: INTRAVENOUS | Status: DC | PRN

## 2024-04-04 MED ORDER — ROCURONIUM BROMIDE 10 MG/ML (PF) SYRINGE
PREFILLED_SYRINGE | INTRAVENOUS | Status: AC
  Filled 2024-04-04: qty 10

## 2024-04-04 MED ORDER — GLYCOPYRROLATE 0.2 MG/ML IJ SOLN
INTRAMUSCULAR | Status: DC | PRN
Start: 2024-04-04 — End: 2024-04-04
  Administered 2024-04-04: .2 mg via INTRAVENOUS

## 2024-04-04 MED ORDER — PHENYLEPHRINE HCL (PRESSORS) 10 MG/ML IV SOLN
INTRAVENOUS | Status: DC | PRN
  Administered 2024-04-04 (×2): 80 ug via INTRAVENOUS

## 2024-04-04 MED ORDER — SUGAMMADEX SODIUM 200 MG/2ML IV SOLN
INTRAVENOUS | Status: AC
Start: 2024-04-04 — End: 2024-04-04
  Filled 2024-04-04: qty 2

## 2024-04-04 MED ORDER — ACETAMINOPHEN 10 MG/ML IV SOLN
1000.0000 mg | Freq: Once | INTRAVENOUS | Status: DC | PRN
  Administered 2024-04-04: 1000 mg via INTRAVENOUS

## 2024-04-04 MED ORDER — DEXAMETHASONE SODIUM PHOSPHATE 10 MG/ML IJ SOLN
INTRAMUSCULAR | Status: AC
  Filled 2024-04-04: qty 1

## 2024-04-04 MED ORDER — FENTANYL CITRATE (PF) 100 MCG/2ML IJ SOLN
INTRAMUSCULAR | Status: DC | PRN
  Administered 2024-04-04 (×4): 50 ug via INTRAVENOUS

## 2024-04-04 MED ORDER — OXYCODONE HCL 5 MG PO TABS
5.0000 mg | ORAL_TABLET | Freq: Four times a day (QID) | ORAL | Status: DC | PRN
  Administered 2024-04-04 – 2024-04-06 (×6): 5 mg via ORAL
  Filled 2024-04-04 (×6): qty 1

## 2024-04-04 MED ORDER — PROPOFOL 10 MG/ML IV BOLUS
INTRAVENOUS | Status: DC | PRN
  Administered 2024-04-04: 100 mg via INTRAVENOUS

## 2024-04-04 MED ORDER — SUCCINYLCHOLINE CHLORIDE 200 MG/10ML IV SOSY
PREFILLED_SYRINGE | INTRAVENOUS | Status: AC
  Filled 2024-04-04: qty 10

## 2024-04-04 MED ORDER — LIDOCAINE HCL (PF) 2 % IJ SOLN
INTRAMUSCULAR | Status: AC
Start: 2024-04-04 — End: 2024-04-04
  Filled 2024-04-04: qty 5

## 2024-04-04 MED ORDER — LIDOCAINE 2% (20 MG/ML) 5 ML SYRINGE
INTRAMUSCULAR | Status: DC | PRN
  Administered 2024-04-04: 100 mg via INTRAVENOUS

## 2024-04-04 MED ORDER — PHENYLEPHRINE HCL-NACL 20-0.9 MG/250ML-% IV SOLN
INTRAVENOUS | Status: DC | PRN
  Administered 2024-04-04: 30 ug/min via INTRAVENOUS

## 2024-04-04 MED ORDER — OXYCODONE HCL 5 MG PO TABS: 5.0000 mg | ORAL_TABLET | Freq: Once | ORAL | Status: DC | PRN

## 2024-04-04 MED ORDER — SUGAMMADEX SODIUM 200 MG/2ML IV SOLN
INTRAVENOUS | Status: DC | PRN
  Administered 2024-04-04 (×2): 200 mg via INTRAVENOUS

## 2024-04-04 MED ORDER — EPHEDRINE 5 MG/ML INJ
INTRAVENOUS | Status: AC
  Filled 2024-04-04: qty 5

## 2024-04-04 MED ORDER — ALBUMIN HUMAN 5 % IV SOLN
INTRAVENOUS | Status: AC
  Filled 2024-04-04: qty 250

## 2024-04-04 MED ORDER — ROCURONIUM BROMIDE 10 MG/ML (PF) SYRINGE
PREFILLED_SYRINGE | INTRAVENOUS | Status: DC | PRN
  Administered 2024-04-04: 60 mg via INTRAVENOUS
  Administered 2024-04-04: 10 mg via INTRAVENOUS

## 2024-04-04 MED ORDER — ACETAMINOPHEN 10 MG/ML IV SOLN
INTRAVENOUS | Status: AC
Start: 2024-04-04 — End: 2024-04-04
  Filled 2024-04-04: qty 100

## 2024-04-04 MED ORDER — FENTANYL CITRATE PF 50 MCG/ML IJ SOSY
PREFILLED_SYRINGE | INTRAMUSCULAR | Status: AC
  Filled 2024-04-04: qty 2

## 2024-04-04 MED ORDER — FENTANYL CITRATE PF 50 MCG/ML IJ SOSY
25.0000 ug | PREFILLED_SYRINGE | INTRAMUSCULAR | Status: DC | PRN
  Administered 2024-04-04 (×3): 50 ug via INTRAVENOUS

## 2024-04-04 MED ORDER — DEXAMETHASONE SODIUM PHOSPHATE 10 MG/ML IJ SOLN
INTRAMUSCULAR | Status: DC | PRN
  Administered 2024-04-04: 5 mg via INTRAVENOUS

## 2024-04-04 MED ORDER — ALBUMIN HUMAN 5 % IV SOLN: INTRAVENOUS | Status: DC | PRN

## 2024-04-04 MED ORDER — PHENYLEPHRINE 80 MCG/ML (10ML) SYRINGE FOR IV PUSH (FOR BLOOD PRESSURE SUPPORT)
PREFILLED_SYRINGE | INTRAVENOUS | Status: AC
  Filled 2024-04-04: qty 10

## 2024-04-04 MED ORDER — LACTATED RINGERS IV SOLN: INTRAVENOUS | Status: DC

## 2024-04-04 SURGICAL SUPPLY — 38 items
BAG COUNTER SPONGE SURGICOUNT (BAG) IMPLANT
CANISTER WOUND CARE 500ML ATS (WOUND CARE) IMPLANT
CHLORAPREP W/TINT 26 (MISCELLANEOUS) ×1 IMPLANT
COVER MAYO STAND STRL (DRAPES) ×1 IMPLANT
COVER SURGICAL LIGHT HANDLE (MISCELLANEOUS) ×1 IMPLANT
DRAPE LAPAROSCOPIC ABDOMINAL (DRAPES) ×1 IMPLANT
DRAPE UTILITY XL STRL (DRAPES) ×1 IMPLANT
DRAPE WARM FLUID 44X44 (DRAPES) ×1 IMPLANT
DRSG OPSITE POSTOP 4X10 (GAUZE/BANDAGES/DRESSINGS) IMPLANT
DRSG OPSITE POSTOP 4X6 (GAUZE/BANDAGES/DRESSINGS) IMPLANT
DRSG OPSITE POSTOP 4X8 (GAUZE/BANDAGES/DRESSINGS) IMPLANT
DRSG VAC GRANUFOAM LG (GAUZE/BANDAGES/DRESSINGS) IMPLANT
DRSG VAC GRANUFOAM MED (GAUZE/BANDAGES/DRESSINGS) IMPLANT
ELECT REM PT RETURN 15FT ADLT (MISCELLANEOUS) ×1 IMPLANT
GLOVE BIOGEL PI IND STRL 7.0 (GLOVE) ×1 IMPLANT
GLOVE SURG SS PI 7.0 STRL IVOR (GLOVE) ×1 IMPLANT
GOWN STRL REUS W/ TWL LRG LVL3 (GOWN DISPOSABLE) ×1 IMPLANT
HANDLE SUCTION POOLE (INSTRUMENTS) ×1 IMPLANT
KIT BASIN OR (CUSTOM PROCEDURE TRAY) ×1 IMPLANT
KIT TURNOVER KIT A (KITS) ×1 IMPLANT
LIGASURE IMPACT 36 18CM CVD LR (INSTRUMENTS) IMPLANT
PACK GENERAL/GYN (CUSTOM PROCEDURE TRAY) ×1 IMPLANT
RELOAD STAPLE 100 3.8 BLU REG (MISCELLANEOUS) IMPLANT
RELOAD STAPLE 75 3.8 BLU REG (ENDOMECHANICALS) IMPLANT
STAPLER GUN LINEAR PROX 60 (STAPLE) IMPLANT
STAPLER PROXIMATE 100MM BLUE (MISCELLANEOUS) IMPLANT
STAPLER PROXIMATE 75MM BLUE (STAPLE) IMPLANT
STAPLER SKIN PROX 35W (STAPLE) ×1 IMPLANT
SUT MNCRL AB 4-0 PS2 18 (SUTURE) IMPLANT
SUT PDS AB 0 CT1 36 (SUTURE) IMPLANT
SUT SILK 2 0 SH CR/8 (SUTURE) ×1 IMPLANT
SUT SILK 2-0 18XBRD TIE 12 (SUTURE) ×1 IMPLANT
SUT SILK 3 0 SH CR/8 (SUTURE) ×1 IMPLANT
SUT SILK 3-0 18XBRD TIE 12 (SUTURE) ×1 IMPLANT
TOWEL OR 17X26 10 PK STRL BLUE (TOWEL DISPOSABLE) ×1 IMPLANT
TRAY FOLEY MTR SLVR 14FR STAT (SET/KITS/TRAYS/PACK) ×1 IMPLANT
TRAY FOLEY MTR SLVR 16FR STAT (SET/KITS/TRAYS/PACK) ×1 IMPLANT
YANKAUER SUCT BULB TIP NO VENT (SUCTIONS) ×1 IMPLANT

## 2024-04-04 NOTE — Anesthesia Postprocedure Evaluation (Signed)
 Anesthesia Post Note  Patient: Olivia [name]  Procedure(s) Performed: EXPLORATORY LAPAROTOMY, LYSIS OF ADHESIONS; ILEOCECECTOMY WITH ANASTAMOSIS     Patient location during evaluation: PACU Anesthesia Type: General Level of consciousness: awake Pain management: pain level controlled Vital Signs Assessment: post-procedure vital signs reviewed and stable Respiratory status: spontaneous breathing Cardiovascular status: blood pressure returned to baseline Postop Assessment: no apparent nausea or vomiting Anesthetic complications: no   No notable events documented.             Lauraine KATHEE [place]

## 2024-04-04 NOTE — Anesthesia Procedure Notes (Signed)
 Procedure Name: Intubation Date/Time: 04/04/2024 12:40 PM  Performed by: Zulema Leita PARAS, CRNAPre-anesthesia Checklist: Patient identified, Emergency Drugs available, Suction available and Patient being monitored Patient Re-evaluated:Patient Re-evaluated prior to induction Oxygen  Delivery Method: Circle system utilized Preoxygenation: Pre-oxygenation with 100% oxygen  Induction Type: IV induction Ventilation: Mask ventilation without difficulty Laryngoscope Size: Mac and 4 Grade View: Grade I Tube type: Oral Tube size: 7.0 mm Number of attempts: 1 Airway Equipment and Method: Stylet Placement Confirmation: ETT inserted through vocal cords under direct vision, positive ETCO2 and breath sounds checked- equal and bilateral Secured at: 21 cm Tube secured with: Tape Dental Injury: Teeth and Oropharynx as per pre-operative assessment

## 2024-04-04 NOTE — TOC Initial Note (Signed)
 Transition of Care [facility]) - Initial/Assessment Note    Patient Details  Name: Olivia [name] MRN: [id number] Date of Birth: April 02, 1956  [facility]) CM/SW Contact:    Bascom Service, RN Phone Number: 04/04/2024, 9:33 AM  Clinical Narrative: Spoke to spouse Fairy about d/c plans-d/c plan home. S/p ileostomy takedown w/drainage.Monitor  for d/c needs.                Expected Discharge Plan: Home/Self Care Barriers to Discharge: Continued Medical Work up   Patient Goals and CMS Choice Patient states their goals for this hospitalization and ongoing recovery are:: Home CMS Medicare.gov Compare Post Acute Care list provided to:: Patient Represenative (must comment) (Joseph(spouse)) Choice offered to / list presented to : Spouse [place] ownership interest in [facility].provided to:: Spouse    Expected Discharge Plan and Services       Living arrangements for the past 2 months: Single Family Home                                      Prior Living Arrangements/Services Living arrangements for the past 2 months: Single Family Home Lives with:: Spouse                   Activities of Daily Living   ADL Screening (condition at time of admission) Independently performs ADLs?: Yes (appropriate for developmental age) Is the patient deaf or have difficulty hearing?: No Does the patient have difficulty seeing, even when wearing glasses/contacts?: No Does the patient have difficulty concentrating, remembering, or making decisions?: No  Permission Sought/Granted                  Emotional Assessment              Admission diagnosis:  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] Postoperative surgical complication involving digestive system associated with digestive system procedure, unspecified complication [K91.89] Patient Active Problem List   Diagnosis Date Noted   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   Irritant contact dermatitis associated with  fecal stoma 02/08/2024   Ileostomy care (HCC) 02/08/2024   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   Medication management 12/08/2023   Intra-abdominal infection 12/08/2023   PICC (peripherally inserted central catheter) in place 12/08/2023   Paroxysmal A-fib (HCC) 11/12/2023   Acute diverticulitis 11/11/2023   Diverticulitis 09/18/2022   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   AVM (arteriovenous malformation) of small bowel, 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   Chronic gastritis 06/27/2022   Melena    GI bleed 05/25/2020   Leukocytosis 05/25/2020   Thrombocytosis 05/25/2020   Normocytic anemia 05/25/2020   GERD (gastroesophageal reflux disease) 05/25/2020   Dyslipidemia 05/25/2020   Vitamin D deficiency 05/25/2020   Choledocholithiasis 12/26/2019   RUQ pain 09/27/2019   Neck mass 09/27/2019   Elevated LFTs 05/02/2019   Fatty liver 05/02/2019   Rectal bleeding 05/02/2019   Peripheral arterial disease 11/14/2013   Palpitations 05/20/2011   Tobacco abuse disorder 05/20/2011   Panic disorder without agoraphobia 07/13/2007   PCP:  [name], Venetia RIGGERS Pharmacy:   [address] [place] - [place], [place] - [address] [place] [address] Phone: [phone] Fax: [phone]  [facility] - [place], [place] - [address] [facility] AT Portal to [facility] [address] [place] [address] Phone: [phone] Fax: [phone]     Social Drivers of Health (SDOH) Social History: SDOH Screenings  Food Insecurity: No Food Insecurity (04/03/2024)  Housing: Low Risk  (04/03/2024)  Transportation Needs: No Transportation Needs (04/03/2024)  Utilities: Not At Risk (04/03/2024)  Depression (PHQ2-9): Low Risk  (12/31/2023)  Social Connections: Moderately Integrated (04/03/2024)  Tobacco Use: Medium Risk (04/02/2024)   SDOH Interventions:     Readmission Risk Interventions    03/30/2024   10:37 AM 01/13/2024    2:33 PM  11/11/2023    1:06 PM  Readmission Risk Prevention Plan  Post Dischage Appt  Complete Complete  Medication Screening  Complete Complete  Transportation Screening Complete Complete Complete  PCP or Specialist Appt within 5-7 Days Complete    Home Care Screening Complete    Medication Review (RN CM) Complete

## 2024-04-04 NOTE — Anesthesia Procedure Notes (Addendum)
 Arterial Line Insertion Start/End9/29/2025 1:00 PM Performed by: Waddell Lauraine NOVAK, MD, anesthesiologist  Preanesthetic checklist: patient identified, IV checked, site marked, risks and benefits discussed, surgical consent, monitors and equipment checked, pre-op evaluation, timeout performed and anesthesia consent Left, radial was placed Catheter size: 18 G  Attempts: 1 Procedure performed using ultrasound guided technique. Ultrasound Notes:anatomy identified, needle tip was noted to be adjacent to the nerve/plexus identified and no ultrasound evidence of intravascular and/or intraneural injection Patient tolerated the procedure well with no immediate complications.

## 2024-04-04 NOTE — Progress Notes (Signed)
      Chief Complaint/Subjective: Nauseated, persistent drainage  Objective: Vital signs in last 24 hours: Temp:  [98.4 F (36.9 C)-99 F (37.2 C)] 98.8 F (37.1 C) (09/29 0500) Pulse Rate:  [76-110] 110 (09/29 0500) Resp:  [16-18] 16 (09/29 0500) BP: (89-123)/(46-98) 106/61 (09/29 0500) SpO2:  [84 %-99 %] 97 % (09/29 0500) Weight:  [66.2 kg] 66.2 kg (09/28 1641) Last BM Date : 04/03/24 Intake/Output from previous day: 09/28 0701 - 09/29 0700 In: 1897.1 [P.O.:50; I.V.:1748.7; IV Piggyback:98.4] Out: -   PE: Gen: NAD Resp: nonlabored Card: tachycardic Abd: bag over right side with feculent output  Lab Results:  Recent Labs    04/03/24 0009 04/04/24 0436  WBC 22.9* 10.5  HGB 11.1* 8.5*  HCT 34.5* 27.5*  PLT 470* 388   Recent Labs    04/03/24 0009  NA 136  K 3.9  CL 95*  CO2 25  GLUCOSE 113*  BUN 18  CREATININE 1.23*  CALCIUM  9.7   No results for input(s): LABPROT, INR in the last 72 hours.    Component Value Date/Time   NA 136 04/03/2024 0009   NA 143 12/07/2019 0751   K 3.9 04/03/2024 0009   CL 95 (L) 04/03/2024 0009   CO2 25 04/03/2024 0009   GLUCOSE 113 (H) 04/03/2024 0009   BUN 18 04/03/2024 0009   BUN 15 12/07/2019 0751   CREATININE 1.23 (H) 04/03/2024 0009   CREATININE 0.79 12/31/2023 1027   CALCIUM  9.7 04/03/2024 0009   PROT 6.5 04/03/2024 0009   PROT 6.4 09/07/2020 1311   ALBUMIN  3.5 04/03/2024 0009   ALBUMIN  3.9 09/07/2020 1311   AST 33 04/03/2024 0009   ALT 58 (H) 04/03/2024 0009   ALKPHOS 82 04/03/2024 0009   BILITOT 0.6 04/03/2024 0009   BILITOT <0.2 09/07/2020 1311   GFRNONAA 47 (L) 04/03/2024 0009   GFRAA >60 01/03/2020 1357    Assessment/Plan  s/p   ileostomy reversal with leak FEN - NPO VTE - lovenox  ID - zosyn  Disposition - OR today for exploration, take down of leak and reanastomosis   LOS: 1 day   I reviewed last 24 h vitals and pain scores, last 48 h intake and output, last 24 h labs and trends, and last 24 h  imaging results.  This care required high  level of medical decision making.   Olivia [name] [facility] at [facility] 04/04/2024, 7:28 AM Please see Amion for pager number during day hours 7:00am-4:30pm or 7:00am -11:30am on weekends

## 2024-04-04 NOTE — Transfer of Care (Signed)
 Immediate Anesthesia Transfer of Care Note  Patient: Olivia [name]  Procedure(s) Performed: EXPLORATORY LAPAROTOMY, LYSIS OF ADHESIONS; ILEOCECECTOMY WITH ANASTAMOSIS  Patient Location: PACU  Anesthesia Type:General  Level of Consciousness: awake, alert , and oriented  Airway & Oxygen  Therapy: Patient Spontanous Breathing and Patient connected to face mask oxygen   Post-op Assessment: Report given to RN and Post -op Vital signs reviewed and stable  Post vital signs: Reviewed and stable  Last Vitals:  Vitals Value Taken Time  BP 127/52 04/04/24 15:15  Temp    Pulse 73 04/04/24 15:20  Resp 21 04/04/24 15:21  SpO2 91 % 04/04/24 15:17  Vitals shown include unfiled device data.  Last Pain:  Vitals:   04/04/24 0736  TempSrc:   PainSc: 2          Complications: No notable events documented.

## 2024-04-04 NOTE — Plan of Care (Signed)

## 2024-04-04 NOTE — Anesthesia Preprocedure Evaluation (Addendum)
 Anesthesia Evaluation  Patient identified by MRN, date of birth, ID band Patient awake    Reviewed: Allergy & Precautions, NPO status , Patient's Chart, lab work & pertinent test results, reviewed documented beta blocker date and time   History of Anesthesia Complications (+) PONV and history of anesthetic complications  Airway Mallampati: I  TM Distance: >3 FB Neck ROM: Full    Dental no notable dental hx. (+) Dental Advisory Given   Pulmonary former smoker   breath sounds clear to auscultation       Cardiovascular hypertension, + Peripheral Vascular Disease  (-) CAD + dysrhythmias Atrial Fibrillation  Rhythm:Irregular Rate:Tachycardia     Neuro/Psych  PSYCHIATRIC DISORDERS Anxiety        GI/Hepatic ,GERD  ,,  Endo/Other    Renal/GU      Musculoskeletal  (+) Arthritis , Rheumatoid disorders,    Abdominal   Peds  Hematology  (+) Blood dyscrasia, anemia   Anesthesia Other Findings   Reproductive/Obstetrics                              Anesthesia Physical Anesthesia Plan  ASA: 3  Anesthesia Plan: General   Post-op Pain Management:    Induction: Intravenous  PONV Risk Score and Plan: 2 and Ondansetron  and TIVA  Airway Management Planned: Oral ETT  Additional Equipment: Arterial line  Intra-op Plan:   Post-operative Plan: Extubation in OR  Informed Consent:      Dental advisory given  Plan Discussed with: CRNA and Surgeon  Anesthesia Plan Comments:         Anesthesia Quick Evaluation

## 2024-04-04 NOTE — Op Note (Signed)
 Preoperative diagnosis: anastomotic leak  Postoperative diagnosis: same   Procedure: lysis of adhesions for 60 minutes, ileocecectomy with ileocolic anastomosis, placement of 60 cm^2 negative pressure dressing  Surgeon: Herlene Bureau, M.D.  Asst: Cathlyn Idler, M.D. Dr. Idler was present for the entirety of the case and due to complexity integrally involved in dissection and decision making  Anesthesia: GETA  Indications for procedure: Olivia [name] is a 68 y.o. year old female with symptoms of abdominal pain and leakage from previous wound.  Description of procedure: The patient was brought into the operative suite. Anesthesia was administered with General endotracheal anesthesia. WHO checklist was applied. The patient was then placed in supine position. The area was prepped and draped in the usual sterile fashion.  Next, previous midline incision was incised.  Cautery was used to dissect down through subcutaneous tissue and the fascia was divided.  There was a notable intraperitoneal mesh that was divided as well.  Blunt and sharp dissection was used to dissect the small intestine and omentum away from the abdominal wall.  In doing this, the release of succus was seen.  Area surrounding previous anastomosis was inspected and there was a 1.5 cm full-thickness defect along the enterotomy side at the anastomosis.  There is a moderate acute inflammatory changes in the area.  Care was taken to dissect the small intestine free of surrounding scar tissue.  The anastomosis was resected with same 5 mm GIA blue load staplers on each side.  LigaSure was used to divide the mesentery.  After further dissection this revealed that there was about 5 cm of distal ileum and approximately there was additional scar tissue.  Cecum was mobilized.  Mesoappendix was divided with LigaSure.  An area about 5 cm in from the ileocecal valve was used to divide the cecum with a 75 mm blue load GIA stapler.  Mesentery of the  cecum was taken left with LigaSure.  Colotomy and enterotomy were made.  Attempts to make an anastomosis was performed however in trying to get alignment of the staplers the small intestine was punctured.  Additional dissection of the distal small intestine was performed getting past the larger area of scar tissue to find healthy small intestine.  An additional 15 cm of small intestine was removed using an additional 75 blue load GIA stapler and taking the mesentery with LigaSure.  Total dissection time was 60 minutes  Next, an ileocolic anastomosis was made with 75 mm blue load stapler.  The enterotomy colotomy was closed with a 60 mm blue load stapler.  A 0 silk was used for antis tension stitch.  Corners were dunked with 3-0 silk sutures.  The anastomosis appeared patent and intact.  Next, the abdomen was irrigated with 2 L of warm saline.  The fascial defect of the previous ileostomy which had reopened due to the infection was reclosed with interrupted 0 PDS.  Next, midline was closed with 0 PDS in running fashion.  Wound VAC was put in place into the subcutaneous tissue of the midline as well as subcutaneous tissue of the ileostomy closure.  Was applied with minimal leak.  Patient awoke from anesthesia brought to PACU in stable condition.  Findings: 1.5 cm dehiscence of enterotomy closure, acute inflammatory changes  Specimen: ileum and cecum  Implant: wound vac   Blood loss: 30 ml  Local anesthesia: none  Complications: none  Herlene Bureau, M.D. General, Bariatric, & Minimally Invasive Surgery [facility]

## 2024-04-05 ENCOUNTER — Encounter (HOSPITAL_COMMUNITY): Payer: Self-pay | Admitting: General Surgery

## 2024-04-05 NOTE — Plan of Care (Signed)

## 2024-04-05 NOTE — Progress Notes (Signed)
  1 Day Post-Op   Chief Complaint/Subjective: Pain controlled, no nausea  Objective: Vital signs in last 24 hours: Temp:  [97.7 F (36.5 C)-98.3 F (36.8 C)] 98.1 F (36.7 C) (09/30 0756) Pulse Rate:  [70-82] 82 (09/30 0756) Resp:  [12-24] 18 (09/30 0756) BP: (110-128)/(43-56) 112/55 (09/30 0756) SpO2:  [96 %-100 %] 98 % (09/30 0503) Arterial Line BP: (130-157)/(34-48) 150/35 (09/29 1545) Last BM Date : 04/04/24 Intake/Output from previous day: 09/29 0701 - 09/30 0700 In: 3109.7 [P.O.:50; I.V.:2673; IV Piggyback:386.7] Out: 450 [Urine:350; Blood:100]  PE: Gen: NAD Resp: nonlabored Card: RRR Abd: vac in place, nondistended  Lab Results:  Recent Labs    04/04/24 0436 04/04/24 1357 04/04/24 1553  WBC 10.5  --  2.2*  HGB 8.5* 7.1* 7.9*  HCT 27.5* 21.0* 26.3*  PLT 388  --  331   Recent Labs    04/03/24 0009 04/04/24 0436 04/04/24 1357  NA 136 134* 137  K 3.9 3.5 3.5  CL 95* 102  --   CO2 25 22  --   GLUCOSE 113* 109*  --   BUN 18 11  --   CREATININE 1.23* 0.95  --   CALCIUM  9.7 8.5*  --    No results for input(s): LABPROT, INR in the last 72 hours.    Component Value Date/Time   NA 137 04/04/2024 1357   NA 143 12/07/2019 0751   K 3.5 04/04/2024 1357   CL 102 04/04/2024 0436   CO2 22 04/04/2024 0436   GLUCOSE 109 (H) 04/04/2024 0436   BUN 11 04/04/2024 0436   BUN 15 12/07/2019 0751   CREATININE 0.95 04/04/2024 0436   CREATININE 0.79 12/31/2023 1027   CALCIUM  8.5 (L) 04/04/2024 0436   PROT 5.0 (L) 04/04/2024 0436   PROT 6.4 09/07/2020 1311   ALBUMIN  2.8 (L) 04/04/2024 0436   ALBUMIN  3.9 09/07/2020 1311   AST 20 04/04/2024 0436   ALT 39 04/04/2024 0436   ALKPHOS 76 04/04/2024 0436   BILITOT 0.4 04/04/2024 0436   BILITOT <0.2 09/07/2020 1311   GFRNONAA >60 04/04/2024 0436   GFRAA >60 01/03/2020 1357    Assessment/Plan  s/p Procedure(s): EXPLORATORY LAPAROTOMY, LYSIS OF ADHESIONS; ILEOCECECTOMY WITH ANASTAMOSIS 04/04/2024  -foley  out -continue vac care -leukopenia concerning  FEN - clamp NG tube, reassess in pm VTE - lovenox  ID - zosyn  for contamination Disposition - inpatient   LOS: 2 days   I reviewed last 24 h vitals and pain scores, last 48 h intake and output, last 24 h labs and trends, and last 24 h imaging results.  This care required high  level of medical decision making.   [name] [facility] Surgery at [facility] 04/05/2024, 8:01 AM Please see Amion for pager number during day hours 7:00am-4:30pm or 7:00am -11:30am on weekends

## 2024-04-06 LAB — CBC
HCT: 24.1 % — ABNORMAL LOW (ref 36.0–46.0)
Hemoglobin: 7.6 g/dL — ABNORMAL LOW (ref 12.0–15.0)
MCH: 29.9 pg (ref 26.0–34.0)
MCHC: 31.5 g/dL (ref 30.0–36.0)
MCV: 94.9 fL (ref 80.0–100.0)
Platelets: 395 K/uL (ref 150–400)
RBC: 2.54 MIL/uL — ABNORMAL LOW (ref 3.87–5.11)
RDW: 14.9 % (ref 11.5–15.5)
WBC: 15.3 K/uL — ABNORMAL HIGH (ref 4.0–10.5)
nRBC: 0 % (ref 0.0–0.2)

## 2024-04-06 LAB — BASIC METABOLIC PANEL WITH GFR
Anion gap: 11 (ref 5–15)
BUN: 14 mg/dL (ref 8–23)
CO2: 22 mmol/L (ref 22–32)
Calcium: 8.4 mg/dL — ABNORMAL LOW (ref 8.9–10.3)
Chloride: 105 mmol/L (ref 98–111)
Creatinine, Ser: 0.83 mg/dL (ref 0.44–1.00)
GFR, Estimated: >60 mL/min (ref >60)
Glucose, Bld: 83 mg/dL (ref 70–99)
Potassium: 3.9 mmol/L (ref 3.5–5.1)
Sodium: 139 mmol/L (ref 135–145)

## 2024-04-06 LAB — SURGICAL PATHOLOGY

## 2024-04-06 MED ORDER — METOPROLOL TARTRATE 5 MG/5ML IV SOLN: 5.0000 mg | Freq: Four times a day (QID) | INTRAVENOUS | Status: DC | PRN

## 2024-04-06 MED ORDER — TRAMADOL HCL 50 MG PO TABS
50.0000 mg | ORAL_TABLET | Freq: Four times a day (QID) | ORAL | Status: DC | PRN
Start: 2024-04-06 — End: 2024-04-12
  Administered 2024-04-06 – 2024-04-12 (×10): 50 mg via ORAL
  Filled 2024-04-06 (×11): qty 1

## 2024-04-06 NOTE — Progress Notes (Signed)
  2 Days Post-Op   Chief Complaint/Subjective: No flatus or BM, no nausea, +appetite, moderate pain  Objective: Vital signs in last 24 hours: Temp:  [98.2 F (36.8 C)-99.3 F (37.4 C)] 98.6 F (37 C) (10/01 0531) Pulse Rate:  [80-105] 105 (10/01 0531) Resp:  [16-20] 20 (10/01 0531) BP: (106-126)/(40-68) 111/68 (10/01 0531) SpO2:  [88 %-98 %] 97 % (10/01 0531) Last BM Date : 04/04/24 Intake/Output from previous day: 09/30 0701 - 10/01 0700 In: 224.9 [P.O.:50; IV Piggyback:174.9] Out: 850 [Urine:850]  PE: Gen: NAD Resp: nonlabored Card: tachycardic Abd: vac in place  Lab Results:  Recent Labs    04/04/24 1553 04/06/24 0450  WBC 2.2* 15.3*  HGB 7.9* 7.6*  HCT 26.3* 24.1*  PLT 331 395   Recent Labs    04/04/24 0436 04/04/24 1357 04/06/24 0450  NA 134* 137 139  K 3.5 3.5 3.9  CL 102  --  105  CO2 22  --  22  GLUCOSE 109*  --  83  BUN 11  --  14  CREATININE 0.95  --  0.83  CALCIUM  8.5*  --  8.4*   No results for input(s): LABPROT, INR in the last 72 hours.    Component Value Date/Time   NA 139 04/06/2024 0450   NA 143 12/07/2019 0751   K 3.9 04/06/2024 0450   CL 105 04/06/2024 0450   CO2 22 04/06/2024 0450   GLUCOSE 83 04/06/2024 0450   BUN 14 04/06/2024 0450   BUN 15 12/07/2019 0751   CREATININE 0.83 04/06/2024 0450   CREATININE 0.79 12/31/2023 1027   CALCIUM  8.4 (L) 04/06/2024 0450   PROT 5.0 (L) 04/04/2024 0436   PROT 6.4 09/07/2020 1311   ALBUMIN  2.8 (L) 04/04/2024 0436   ALBUMIN  3.9 09/07/2020 1311   AST 20 04/04/2024 0436   ALT 39 04/04/2024 0436   ALKPHOS 76 04/04/2024 0436   BILITOT 0.4 04/04/2024 0436   BILITOT <0.2 09/07/2020 1311   GFRNONAA >60 04/06/2024 0450   GFRAA >60 01/03/2020 1357    Assessment/Plan  s/p Procedure(s): EXPLORATORY LAPAROTOMY, LYSIS OF ADHESIONS; ILEOCECECTOMY WITH ANASTAMOSIS 04/04/2024  -dc NG tube  FEN - clear liquids VTE - lovenox  ID - zosyn  Disposition - inpatient   LOS: 3 days   I reviewed  last 24 h vitals and pain scores, last 48 h intake and output, last 24 h labs and trends, and last 24 h imaging results.  This care required moderate level of medical decision making.   [name] [facility] Surgery at [facility] 04/06/2024, 8:36 AM Please see Amion for pager number during day hours 7:00am-4:30pm or 7:00am -11:30am on weekends

## 2024-04-07 LAB — CBC
HCT: 23.6 % — ABNORMAL LOW (ref 36.0–46.0)
Hemoglobin: 7.3 g/dL — ABNORMAL LOW (ref 12.0–15.0)
MCH: 29.1 pg (ref 26.0–34.0)
MCHC: 30.9 g/dL (ref 30.0–36.0)
MCV: 94 fL (ref 80.0–100.0)
Platelets: 416 K/uL — ABNORMAL HIGH (ref 150–400)
RBC: 2.51 MIL/uL — ABNORMAL LOW (ref 3.87–5.11)
RDW: 15 % (ref 11.5–15.5)
WBC: 15.3 K/uL — ABNORMAL HIGH (ref 4.0–10.5)
nRBC: 0.2 % (ref 0.0–0.2)

## 2024-04-07 LAB — BASIC METABOLIC PANEL WITH GFR
Anion gap: 9 (ref 5–15)
BUN: 9 mg/dL (ref 8–23)
CO2: 24 mmol/L (ref 22–32)
Calcium: 8.2 mg/dL — ABNORMAL LOW (ref 8.9–10.3)
Chloride: 102 mmol/L (ref 98–111)
Creatinine, Ser: 0.77 mg/dL (ref 0.44–1.00)
GFR, Estimated: >60 mL/min (ref >60)
Glucose, Bld: 89 mg/dL (ref 70–99)
Potassium: 3.7 mmol/L (ref 3.5–5.1)
Sodium: 135 mmol/L (ref 135–145)

## 2024-04-07 MED ORDER — BOOST / RESOURCE BREEZE PO LIQD CUSTOM
1.0000 | Freq: Three times a day (TID) | ORAL | Status: DC
  Administered 2024-04-07 – 2024-04-11 (×4): 1 via ORAL
  Administered 2024-04-11: 237 mL via ORAL
  Administered 2024-04-12: 1 via ORAL

## 2024-04-07 NOTE — TOC Progression Note (Addendum)
 Transition of Care [facility]) - Progression Note    Patient Details  Name: Olivia [name] MRN: [id number] Date of Birth: [birth year]  Transition of Care [facility]) CM/SW Contact  Ahamed Hofland, Nathanel, RN Phone Number: 04/07/2024, 10:21 AM  Clinical Narrative: Has abd wound vac in place.spoke to patient/spouse about d/c plans-d/c home;may need HHRN for wound vac-[employer] rep Randine will follow for wound vac;& [facility] rep Jon will check if can accept for [facility] if needed.  -3:41p checked with several HHC agencies-Unable to accept-[facility], [facility]. Contacted [facility] tel#[phone];faxed info to [phone]-will call in am to confirm receipt. -4:11p-[facility] rep Rea confirmed can accept & received all info. Await HHRN order,face to face order, & monitor if wound vac needed for home    Expected Discharge Plan: Home w Home Health Services Barriers to Discharge: Continued Medical Work up               Expected Discharge Plan and Services       Living arrangements for the past 2 months: Single Family Home                                       Social Drivers of Health (SDOH) Interventions SDOH Screenings   Food Insecurity: No Food Insecurity (04/03/2024)  Housing: Low Risk  (04/03/2024)  Transportation Needs: No Transportation Needs (04/03/2024)  Utilities: Not At Risk (04/03/2024)  Depression (PHQ2-9): Low Risk  (12/31/2023)  Social Connections: Moderately Integrated (04/03/2024)  Tobacco Use: Medium Risk (04/02/2024)    Readmission Risk Interventions    04/04/2024    9:35 AM 03/30/2024   10:37 AM 01/13/2024    2:33 PM  Readmission Risk Prevention Plan  Post Dischage Appt   Complete  Medication Screening   Complete  Transportation Screening Complete Complete Complete  PCP or Specialist Appt within 5-7 Days  Complete   PCP or Specialist Appt within 3-5 Days Complete    Home Care Screening  Complete   Medication Review  (RN CM)  Complete   HRI or Home Care Consult Complete    Social Work Consult for Recovery Care Planning/Counseling Complete    Palliative Care Screening Complete    Medication Review Oceanographer) Complete

## 2024-04-07 NOTE — Consult Note (Addendum)
 WOC Nurse Consult Note: Reason for Consult: Asked a consultation regarding a blockage in the wound VAC dressing. Wound type: surgical Pressure Injury POA: NA  Dressing procedure: I flushed the tubing with sterile saline in both directions and noticed numerous obstructions along the tubing. I turned on the machine, which restarted immediately, operating without issues or alarms. Bellow the plug, was a loose area between the drape pieces, applied Tegaderm to reinforce the area. The dressing was intact, there was no need to remove it.  WOC team will not plan to follow further. Please reconsult if further assistance is needed. Thank-you,  Lela Holm RN, CNS, [employer], MSN.  (Phone [phone])

## 2024-04-07 NOTE — Progress Notes (Signed)
  3 Days Post-Op   Chief Complaint/Subjective: Nausea, bloating, no flatus or BMs  Objective: Vital signs in last 24 hours: Temp:  [98.4 F (36.9 C)-99.5 F (37.5 C)] 98.8 F (37.1 C) (10/02 0445) Pulse Rate:  [72-118] 72 (10/02 0445) Resp:  [18-20] 18 (10/02 0445) BP: (116-128)/(54-71) 124/54 (10/02 0445) SpO2:  [92 %-95 %] 95 % (10/02 0445) Last BM Date : 04/04/28 Intake/Output from previous day: 10/01 0701 - 10/02 0700 In: 240 [P.O.:240] Out: 1100 [Urine:1100]  PE: Gen: NAd Resp: nonlabored Card: RRR Abd: soft, vac in place with some suction, concern for clogged tubing  Lab Results:  Recent Labs    04/06/24 0450 04/07/24 0440  WBC 15.3* 15.3*  HGB 7.6* 7.3*  HCT 24.1* 23.6*  PLT 395 416*   Recent Labs    04/06/24 0450 04/07/24 0440  NA 139 135  K 3.9 3.7  CL 105 102  CO2 22 24  GLUCOSE 83 89  BUN 14 9  CREATININE 0.83 0.77  CALCIUM  8.4* 8.2*   No results for input(s): LABPROT, INR in the last 72 hours.    Component Value Date/Time   NA 135 04/07/2024 0440   NA 143 12/07/2019 0751   K 3.7 04/07/2024 0440   CL 102 04/07/2024 0440   CO2 24 04/07/2024 0440   GLUCOSE 89 04/07/2024 0440   BUN 9 04/07/2024 0440   BUN 15 12/07/2019 0751   CREATININE 0.77 04/07/2024 0440   CREATININE 0.79 12/31/2023 1027   CALCIUM  8.2 (L) 04/07/2024 0440   PROT 5.0 (L) 04/04/2024 0436   PROT 6.4 09/07/2020 1311   ALBUMIN  2.8 (L) 04/04/2024 0436   ALBUMIN  3.9 09/07/2020 1311   AST 20 04/04/2024 0436   ALT 39 04/04/2024 0436   ALKPHOS 76 04/04/2024 0436   BILITOT 0.4 04/04/2024 0436   BILITOT <0.2 09/07/2020 1311   GFRNONAA >60 04/07/2024 0440   GFRAA >60 01/03/2020 1357    Assessment/Plan  s/p Procedure(s): EXPLORATORY LAPAROTOMY, LYSIS OF ADHESIONS; ILEOCECECTOMY WITH ANASTAMOSIS 04/04/2024   ABLA - hemoglobin slowly decreasing FEN - clear liquids, add breeze, discussed concern for ileus VTE - lovenox  ID - zosyn , will end 10/3 Disposition -  inpatient   LOS: 4 days   I reviewed last 24 h vitals and pain scores, last 48 h intake and output, last 24 h labs and trends, and last 24 h imaging results.  This care required moderate level of medical decision making.   [name] [facility] Surgery at [facility] 04/07/2024, 8:25 AM Please see Amion for pager number during day hours 7:00am-4:30pm or 7:00am -11:30am on weekends

## 2024-04-08 ENCOUNTER — Other Ambulatory Visit: Payer: Self-pay

## 2024-04-08 ENCOUNTER — Inpatient Hospital Stay (HOSPITAL_COMMUNITY)

## 2024-04-08 LAB — BASIC METABOLIC PANEL WITH GFR
Anion gap: 13 (ref 5–15)
BUN: 7 mg/dL — ABNORMAL LOW (ref 8–23)
CO2: 22 mmol/L (ref 22–32)
Calcium: 8.3 mg/dL — ABNORMAL LOW (ref 8.9–10.3)
Chloride: 104 mmol/L (ref 98–111)
Creatinine, Ser: 0.72 mg/dL (ref 0.44–1.00)
GFR, Estimated: >60 mL/min (ref >60)
Glucose, Bld: 81 mg/dL (ref 70–99)
Potassium: 3.6 mmol/L (ref 3.5–5.1)
Sodium: 139 mmol/L (ref 135–145)

## 2024-04-08 LAB — TYPE AND SCREEN
ABO/RH(D): A POS
Antibody Screen: NEGATIVE
Unit division: 0
Unit division: 0

## 2024-04-08 LAB — BPAM RBC
Blood Product Expiration Date: 202510182359
Blood Product Expiration Date: 202510192359
ISSUE DATE / TIME: 202509291416
ISSUE DATE / TIME: 202509291416
Unit Type and Rh: 6200
Unit Type and Rh: 6200

## 2024-04-08 LAB — CBC
HCT: 24.9 % — ABNORMAL LOW (ref 36.0–46.0)
Hemoglobin: 7.7 g/dL — ABNORMAL LOW (ref 12.0–15.0)
MCH: 29.1 pg (ref 26.0–34.0)
MCHC: 30.9 g/dL (ref 30.0–36.0)
MCV: 94 fL (ref 80.0–100.0)
Platelets: 497 K/uL — ABNORMAL HIGH (ref 150–400)
RBC: 2.65 MIL/uL — ABNORMAL LOW (ref 3.87–5.11)
RDW: 14.8 % (ref 11.5–15.5)
WBC: 11.5 K/uL — ABNORMAL HIGH (ref 4.0–10.5)
nRBC: 0.3 % — ABNORMAL HIGH (ref 0.0–0.2)

## 2024-04-08 MED ORDER — SODIUM CHLORIDE 0.9% FLUSH
10.0000 mL | Freq: Two times a day (BID) | INTRAVENOUS | Status: DC
  Administered 2024-04-09: 10 mL
  Administered 2024-04-11: 20 mL

## 2024-04-08 MED ORDER — SODIUM CHLORIDE 0.9% FLUSH: 10.0000 mL | INTRAVENOUS | Status: DC | PRN

## 2024-04-08 MED ORDER — INSULIN ASPART 100 UNIT/ML IJ SOLN
0.0000 [IU] | Freq: Four times a day (QID) | INTRAMUSCULAR | Status: DC
  Administered 2024-04-09: 1 [IU] via SUBCUTANEOUS
  Administered 2024-04-09: 2 [IU] via SUBCUTANEOUS
  Administered 2024-04-09 (×2): 1 [IU] via SUBCUTANEOUS
  Administered 2024-04-10 (×2): 2 [IU] via SUBCUTANEOUS
  Administered 2024-04-10: 1 [IU] via SUBCUTANEOUS
  Administered 2024-04-10: 2 [IU] via SUBCUTANEOUS
  Administered 2024-04-11 (×2): 1 [IU] via SUBCUTANEOUS

## 2024-04-08 MED ORDER — TRAVASOL 10 % IV SOLN
INTRAVENOUS | Status: AC
  Filled 2024-04-08: qty 480

## 2024-04-08 MED ORDER — JUVEN PO PACK
1.0000 | PACK | Freq: Two times a day (BID) | ORAL | Status: DC
  Administered 2024-04-09 – 2024-04-11 (×6): 1 via ORAL
  Filled 2024-04-08 (×6): qty 1

## 2024-04-08 MED ORDER — CHLORHEXIDINE GLUCONATE CLOTH 2 % EX PADS
6.0000 | MEDICATED_PAD | Freq: Every day | CUTANEOUS | Status: DC
  Administered 2024-04-08 – 2024-04-12 (×5): 6 via TOPICAL

## 2024-04-08 MED ORDER — SODIUM CHLORIDE 0.9 % IV SOLN
12.5000 mg | Freq: Four times a day (QID) | INTRAVENOUS | Status: DC | PRN
  Administered 2024-04-08 – 2024-04-11 (×2): 12.5 mg via INTRAVENOUS
  Filled 2024-04-08: qty 12.5
  Filled 2024-04-08: qty 0.5

## 2024-04-08 MED ORDER — POTASSIUM CHLORIDE 10 MEQ/100ML IV SOLN
10.0000 meq | INTRAVENOUS | Status: AC
  Administered 2024-04-08 (×4): 10 meq via INTRAVENOUS
  Filled 2024-04-08 (×4): qty 100

## 2024-04-08 NOTE — Progress Notes (Signed)
 PHARMACY - TOTAL PARENTERAL NUTRITION CONSULT NOTE   Indication: Prolonged ileus  Patient Measurements: Height: 5' 3 (160 cm) Weight: 66.2 kg (145 lb 15.1 oz) IBW/kg (Calculated) : 52.4 TPN AdjBW (KG): 55.8 Body mass index is 25.85 kg/m. Usual Weight:   Assessment:  Pharmacy is consulted to start TPN on 68 yo female who is post-op with procedure on 9/29 as well as procedure on 9/23.   Glucose / Insulin:  - no hx of DM2 - blood glucose of CMP has been WNL  Electrolytes:  - Electrolytes are WNL, except K+ is borderline low at 3.6  - Corrected Calcium  is 9.3, WNL Renal:  - SCr < 1  - BUN WNL Hepatic:  - LFTs, AlkPhos, bilirubin WNL  - albumin  low at 2.8  Intake / Output; MIVF:  - 1000 ml UOP in past 24 hours, 100 ml from drain - -755 mL I/O in past 24 hours  - On LR at 20 ml/hr  GI Imaging: - done prior to procedure  GI Surgeries / Procedures:  -9/23 ileostomy closure - 9/28 exploratory lapartomy, lysis of adhesions, ileocecectomy with anastamosis  Central access: ordered for 10/3 TPN start date: 10/3  Nutritional Goals: pending RD goals  Goal TPN rate is --- mL/hr (provides --- g of protein and --- kcals per day)  RD Assessment:    Current Nutrition:  Clear liquids  Plan:  Now - Potassium Chloride  10 meq IV x 4   Start TPN at 40 mL/hr at 1800 Electrolytes in TPN:  Na 104mEq/L  K 50mEq/L  Ca 57mEq/L Mg 5mEq/L Phos 15mmol/L.  Cl:Ac 1:1 Add standard MVI and trace elements to TPN Initiate Sensitive q6h SSI and adjust as needed  Continue MIVF at 20 mL/hr at 1800 BMP, magnesium , phosphorus with AM labs  Monitor TPN labs on Mon/Thurs    Dolphus Roller, PharmD, BCPS 04/08/2024 11:39 AM

## 2024-04-08 NOTE — TOC Progression Note (Signed)
 Transition of Care [facility]) - Progression Note    Patient Details  Name: Olivia [name] MRN: [id number] Date of Birth: 1956-06-11  Transition of Care [facility]) CM/SW Contact  Oshay Stranahan, Nathanel, RN Phone Number: 04/08/2024, 3:34 PM  Clinical Narrative: [facility] Leah following for [facility] if needed tel#[phone];fax#[phone]-await HHC orders & face to face. Noted wound vac-[employer] rep Traci following. Noted on TPN-rep Holley Herring following.      Expected Discharge Plan: Home w Home Health Services Barriers to Discharge: Continued Medical Work up               Expected Discharge Plan and Services       Living arrangements for the past 2 months: Single Family Home                                       Social Drivers of Health (SDOH) Interventions SDOH Screenings   Food Insecurity: No Food Insecurity (04/03/2024)  Housing: Low Risk  (04/03/2024)  Transportation Needs: No Transportation Needs (04/03/2024)  Utilities: Not At Risk (04/03/2024)  Depression (PHQ2-9): Low Risk  (12/31/2023)  Social Connections: Moderately Integrated (04/03/2024)  Tobacco Use: Medium Risk (04/02/2024)    Readmission Risk Interventions    04/04/2024    9:35 AM 03/30/2024   10:37 AM 01/13/2024    2:33 PM  Readmission Risk Prevention Plan  Post Dischage Appt   Complete  Medication Screening   Complete  Transportation Screening Complete Complete Complete  PCP or Specialist Appt within 5-7 Days  Complete   PCP or Specialist Appt within 3-5 Days Complete    Home Care Screening  Complete   Medication Review (RN CM)  Complete   HRI or Home Care Consult Complete    Social Work Consult for Recovery Care Planning/Counseling Complete    Palliative Care Screening Complete    Medication Review Oceanographer) Complete

## 2024-04-08 NOTE — Consult Note (Addendum)
 WOC Nurse Consult Note: Reason for Consult: NPWT, dressing changes. Wound type: surgical Pressure Injury POA: NA Measurement: mid line from laparotomy 15.5 cm x 3 cm x 3 cm. Ileocecectomy with anastomosis (colostomy closed) 3 cm x 3 cm, tunneling with 3 cm at 3 o'clock. Wound bed: 60% pale red, 40% yellow slough. Drainage (amount, consistency, odor) Minimum, serosanguinous, 100 ml in canister at 1030. Periwound: intact. Dressing procedure/placement/frequency: Removed old NPWT dressing Cleansed wound with normal saline Periwound skin protected with skin barrier wipe or window framed with drape Skin protected with VAC drape for foam bridge, connecting the openings.   Filled wound with 2 piece of black foam  Sealed NPWT dressing at HG Patient received IV pain medication per bedside nurse prior to dressing change Patient tolerated procedure well  WOC nurse will continue to provide NPWT dressing changed due to the complexity of the dressing change.      WOC team will follow MON/WED/FRI.  Please reconsult if further assistance is needed. Thank-you,  Lela Holm RN, CNS, [employer], MSN.  (Phone [phone])

## 2024-04-08 NOTE — Progress Notes (Signed)
 Initial Nutrition Assessment  DOCUMENTATION CODES:   Non-severe (moderate) malnutrition in context of acute illness/injury  INTERVENTION:   Monitor magnesium , potassium, and phosphorus daily for at least 3 days, MD to replete as needed, as pt is at risk for refeeding syndrome. -Recommend Thiamine 100 mg daily for 5-7 days   -TPN management per Pharmacy -Daily weights while on TPN  -Boost Breeze po TID, each supplement provides 250 kcal and 9 grams of protein   -1 packet Juven BID as able, each packet provides 95 calories, 2.5 grams of protein (collagen), and 9.8 grams of carbohydrate (3 grams sugar); also contains 7 grams of L-arginine and L-glutamine, 300 mg vitamin C, 15 mg vitamin E, 1.2 mcg vitamin B-12, 9.5 mg zinc, 200 mg calcium , and 1.5 g  Calcium  Beta-hydroxy-Beta-methylbutyrate to support wound healing   NUTRITION DIAGNOSIS:   Moderate Malnutrition related to acute illness (s/p colectomy, ileocecectomy) as evidenced by energy intake < 75% for > 7 days, mild fat depletion, mild muscle depletion, percent weight loss.  GOAL:   Patient will meet greater than or equal to 90% of their needs  MONITOR:   Labs (TPN)  REASON FOR ASSESSMENT:   Consult New TPN/TNA  ASSESSMENT:   68 year old female status post ileostomy takedown 9/23 by Dr. Stevie after previous colon resection for diverticulitis. Now with enterocutaneous fistula.  9/27: admitted 9/29: s/p ex lap, LOA, ileocecectomy with ileocolic anastomosis, Wound VAC  Patient in room, states she is awaiting PICC placement. Reports sipping on liquids, water  mainly tastes good to her. Boost Breeze at bedside, will add Juven as well to help with healing. Reports she has not been able to eat well since her initial surgery in July 2025. Had mainly been consuming soft easy to digest foods, the most solid item she would eat was a grilled cheese on pumpernickel bread. During the admission has been NPO/CLD since 9/27.   TPN to  begin at 40 ml/hr, providing 969 kcals, 48g protein.  Per weight records, pt has lost 15 lbs since 4/28 (9% wt loss x 5 months, significant for time frame).  Medications: KCl  Labs reviewed.  NUTRITION - FOCUSED PHYSICAL EXAM:  Flowsheet Row Most Recent Value  Orbital Region Mild depletion  Upper Arm Region Moderate depletion  Thoracic and Lumbar Region No depletion  Buccal Region Mild depletion  Temple Region Mild depletion  Clavicle Bone Region Mild depletion  Clavicle and Acromion Bone Region Mild depletion  Scapular Bone Region Mild depletion  Dorsal Hand Mild depletion  Patellar Region No depletion  Anterior Thigh Region No depletion  Posterior Calf Region No depletion  Edema (RD Assessment) None  Hair Reviewed  Eyes Reviewed  Mouth Reviewed  Skin Reviewed  [bruising, abrasions on BLEs and BUEs]  Nails Reviewed    Diet Order:   Diet Order             Diet clear liquid Fluid consistency: Thin  Diet effective now                   EDUCATION NEEDS:   Education needs have been addressed  Skin:  Skin Assessment: Skin Integrity Issues: Skin Integrity Issues:: Incisions Incisions: 9/23 abdomen  Last BM:  9/28 -type 7  Height:   Ht Readings from Last 1 Encounters:  04/03/24 5' 3 (1.6 m)    Weight:   Wt Readings from Last 1 Encounters:  04/03/24 66.2 kg    BMI:  Body mass index is 25.85 kg/m.  Estimated Nutritional  Needs:   Kcal:  1900-2100  Protein:  90-105g  Fluid:  2L/day  Morna Lee, MS, RD, LDN Inpatient Clinical Dietitian Contact via Secure chat

## 2024-04-08 NOTE — Progress Notes (Signed)
  4 Days Post-Op   Chief Complaint/Subjective: Some bloating, no flatus or BMs  Objective: Vital signs in last 24 hours: Temp:  [98.3 F (36.8 C)-99.2 F (37.3 C)] 98.5 F (36.9 C) (10/03 0607) Pulse Rate:  [64-75] 75 (10/03 0607) Resp:  [16-18] 16 (10/03 0607) BP: (114-126)/(49-60) 126/57 (10/03 0607) SpO2:  [92 %-95 %] 95 % (10/03 0607) Last BM Date : 04/04/28 Intake/Output from previous day: 10/02 0701 - 10/03 0700 In: 345 [P.O.:50; IV Piggyback:295] Out: 1100 [Urine:1000; Drains:100]  PE: Gen: NAd Resp: nonlabored Card: RRR Abd: soft, vac in place  Lab Results:  Recent Labs    04/07/24 0440 04/08/24 0440  WBC 15.3* 11.5*  HGB 7.3* 7.7*  HCT 23.6* 24.9*  PLT 416* 497*   Recent Labs    04/07/24 0440 04/08/24 0440  NA 135 139  K 3.7 3.6  CL 102 104  CO2 24 22  GLUCOSE 89 81  BUN 9 7*  CREATININE 0.77 0.72  CALCIUM  8.2* 8.3*   No results for input(s): LABPROT, INR in the last 72 hours.    Component Value Date/Time   NA 139 04/08/2024 0440   NA 143 12/07/2019 0751   K 3.6 04/08/2024 0440   CL 104 04/08/2024 0440   CO2 22 04/08/2024 0440   GLUCOSE 81 04/08/2024 0440   BUN 7 (L) 04/08/2024 0440   BUN 15 12/07/2019 0751   CREATININE 0.72 04/08/2024 0440   CREATININE 0.79 12/31/2023 1027   CALCIUM  8.3 (L) 04/08/2024 0440   PROT 5.0 (L) 04/04/2024 0436   PROT 6.4 09/07/2020 1311   ALBUMIN  2.8 (L) 04/04/2024 0436   ALBUMIN  3.9 09/07/2020 1311   AST 20 04/04/2024 0436   ALT 39 04/04/2024 0436   ALKPHOS 76 04/04/2024 0436   BILITOT 0.4 04/04/2024 0436   BILITOT <0.2 09/07/2020 1311   GFRNONAA >60 04/08/2024 0440   GFRAA >60 01/03/2020 1357    Assessment/Plan  s/p Procedure(s): EXPLORATORY LAPAROTOMY, LYSIS OF ADHESIONS; ILEOCECECTOMY WITH ANASTAMOSIS 04/04/2024   FEN - clear liquids, breeze VTE - lovenox  ID - stop zosyn  Disposition - XR today to eval for ileus   LOS: 5 days   I reviewed last 24 h vitals and pain scores, last 48 h  intake and output, last 24 h labs and trends, and last 24 h imaging results.  This care required moderate level of medical decision making.   [name] [facility] at [facility] 04/08/2024, 9:02 AM Please see Amion for pager number during day hours 7:00am-4:30pm or 7:00am -11:30am on weekends

## 2024-04-08 NOTE — Progress Notes (Signed)
 Peripherally Inserted Central Catheter Placement  The IV Nurse has discussed with the patient and/or persons authorized to consent for the patient, the purpose of this procedure and the potential benefits and risks involved with this procedure.  The benefits include less needle sticks, lab draws from the catheter, and the patient may be discharged home with the catheter. Risks include, but not limited to, infection, bleeding, blood clot (thrombus formation), and puncture of an artery; nerve damage and irregular heartbeat and possibility to perform a PICC exchange if needed/ordered by physician.  Alternatives to this procedure were also discussed.  Bard Power PICC patient education guide, fact sheet on infection prevention and patient information card has been provided to patient /or left at bedside.    PICC Placement Documentation  PICC Double Lumen 04/08/24 Right Brachial 36 cm 0 cm (Active)  Indication for Insertion or Continuance of Line Administration of hyperosmolar/irritating solutions (i.e. TPN, Vancomycin, etc.) 04/08/24 1755  Exposed Catheter (cm) 0 cm 04/08/24 1755  Site Assessment Clean, Dry, Intact 04/08/24 1755  Lumen #1 Status Flushed;Saline locked;Blood return noted 04/08/24 1755  Lumen #2 Status Saline locked;Flushed;Blood return noted 04/08/24 1755  Dressing Type Transparent;Securing device 04/08/24 1755  Dressing Status Antimicrobial disc/dressing in place;Clean, Dry, Intact 04/08/24 1755  Line Care Connections checked and tightened 04/08/24 1755  Line Adjustment (NICU/IV Team Only) No 04/08/24 1755  Dressing Intervention New dressing;Adhesive placed at insertion site (IV team only) 04/08/24 1755  Dressing Change Due 04/15/24 04/08/24 1755       Khadija Thier, Cherene Place 04/08/2024, 5:56 PM

## 2024-04-09 LAB — GLUCOSE, CAPILLARY
Glucose-Capillary: 131 mg/dL — ABNORMAL HIGH (ref 70–99)
Glucose-Capillary: 136 mg/dL — ABNORMAL HIGH (ref 70–99)
Glucose-Capillary: 144 mg/dL — ABNORMAL HIGH (ref 70–99)
Glucose-Capillary: 150 mg/dL — ABNORMAL HIGH (ref 70–99)
Glucose-Capillary: 152 mg/dL — ABNORMAL HIGH (ref 70–99)

## 2024-04-09 LAB — BASIC METABOLIC PANEL WITH GFR
Anion gap: 12 (ref 5–15)
BUN: 6 mg/dL — ABNORMAL LOW (ref 8–23)
CO2: 21 mmol/L — ABNORMAL LOW (ref 22–32)
Calcium: 8.4 mg/dL — ABNORMAL LOW (ref 8.9–10.3)
Chloride: 103 mmol/L (ref 98–111)
Creatinine, Ser: 0.63 mg/dL (ref 0.44–1.00)
GFR, Estimated: >60 mL/min (ref >60)
Glucose, Bld: 124 mg/dL — ABNORMAL HIGH (ref 70–99)
Potassium: 4.1 mmol/L (ref 3.5–5.1)
Sodium: 136 mmol/L (ref 135–145)

## 2024-04-09 LAB — PHOSPHORUS: Phosphorus: 2 mg/dL — ABNORMAL LOW (ref 2.5–4.6)

## 2024-04-09 LAB — MAGNESIUM: Magnesium: 1.8 mg/dL (ref 1.7–2.4)

## 2024-04-09 MED ORDER — THIAMINE HCL 100 MG/ML IJ SOLN
100.0000 mg | Freq: Every day | INTRAMUSCULAR | Status: DC
  Administered 2024-04-09 – 2024-04-10 (×2): 100 mg via INTRAVENOUS
  Filled 2024-04-09 (×2): qty 2

## 2024-04-09 MED ORDER — MAGNESIUM SULFATE IN D5W 1-5 GM/100ML-% IV SOLN
1.0000 g | Freq: Once | INTRAVENOUS | Status: AC
  Administered 2024-04-09: 1 g via INTRAVENOUS
  Filled 2024-04-09: qty 100

## 2024-04-09 MED ORDER — SODIUM PHOSPHATES 45 MMOLE/15ML IV SOLN
15.0000 mmol | Freq: Once | INTRAVENOUS | Status: AC
  Administered 2024-04-09: 15 mmol via INTRAVENOUS
  Filled 2024-04-09: qty 5

## 2024-04-09 MED ORDER — TRAVASOL 10 % IV SOLN
INTRAVENOUS | Status: AC
  Filled 2024-04-09: qty 739.2

## 2024-04-09 NOTE — Progress Notes (Signed)
 PHARMACY - TOTAL PARENTERAL NUTRITION CONSULT NOTE   Indication: Prolonged ileus  Patient Measurements: Height: 5' 3 (160 cm) Weight: 68.9 kg (152 lb) IBW/kg (Calculated) : 52.4 TPN AdjBW (KG): 55.8 Body mass index is 26.93 kg/m. Usual Weight:   Assessment: Patient is a 68 y.o F with hx diverticulitis and stoma placement who underwent ileostomy closure on 03/29/24.  She was discharged from [facility] on 03/31/24 and returned back to the ED on 04/03/24 with c/o drainage from surgical site.  She was taken to the OR on 04/04/24 for lysis of adhesions and ileocecectomy with ileocolic anastomosis.  Pharmacy consulted on 04/08/24 to start TPN for prolonged ileus.  Glucose / Insulin: no hx DM - on sSSI q6h (used 2 units in the past 24 hrs) - CBGs (goal <150): wnl  Electrolytes:  - CO2 slightly low at 21, phos low 2, Mag low  1.8; CorrCa wnl 9.36  - Goal for ileus: K >4, Mag >2  Renal: scr <1 (crcl~61), BUN low 6 Hepatic: LFTs on 9/29 wnl Intake / Output; MIVF: not on mIVF - I/O: +133 mL - drain: 100 mL - UOP: 1000 mL  GI Imaging: - 9/28: CT and/pelvis: Postoperative changes from colostomy reversal. Small fluid collection noted in the anterior abdomen at the prior ostomy level. Dilated small bowel loops into the pelvis. Terminal ileum is decompressed. Cannot exclude distal small bowel obstruction. - 10/3 abd xray: Several air-filled mildly dilated small bowel loops in the central Abdomen. Findings may be due to persistent postoperative ileus versus small bowel obstruction. GI Surgeries / Procedures:  - 9/23 ileostomy closure - 9/28 exploratory lapartomy, lysis of adhesions, ileocecectomy with anastamosis   Central access: PICC placed on 10/3 TPN start date: 10/3  Nutritional Goals: Goal TPN rate is 90 mL/hr (provides 95 g of protein and 2044 kcals per day)  RD Assessment: Estimated Needs Total Energy Estimated Needs: 1900-2100 Total Protein Estimated Needs: 90-105g Total Fluid Estimated  Needs: 2L/day  Current Nutrition:  - TPN  - Boost 1 container TID (refused some doses) - Juven 1 packet bid   Plan:   Now:  - Magnesium  sulfate 1gm IV x1 - sodium phosphate  15 mmol IV x1  At 1800:  - Increase TPN rate to 70 mL/hr at 1800 - Electrolytes in TPN:  Na 50mEq/L K 50mEq/L Ca 5mEq/L Mg 7mEq/L Phos 15mmol/L Cl:Ac 1:2 - Add standard MVI and trace elements to TPN - Continue sensitive SSI q6h and adjust as needed  - thiamine 100 mg daily x5 days (10/4 - 10/8) - CMET, phos and mag on 10/5 - Monitor TPN labs on Mon/Thurs at a minimum  Beyza Bellino P 04/09/2024,8:30 AM

## 2024-04-09 NOTE — Progress Notes (Signed)
 5 Days Post-Op   Subjective/Chief Complaint: Complains of soreness. Had a large bm this am   Objective: Vital signs in last 24 hours: Temp:  [98 F (36.7 C)-99.2 F (37.3 C)] 99.2 F (37.3 C) (10/04 0858) Pulse Rate:  [71-84] 80 (10/04 0858) Resp:  [17-20] 19 (10/04 0858) BP: (122-144)/(50-56) 124/50 (10/04 0858) SpO2:  [92 %-97 %] 96 % (10/04 0858) Weight:  [68.9 kg] 68.9 kg (10/04 0500) Last BM Date : 04/09/24  Intake/Output from previous day: 10/03 0701 - 10/04 0700 In: 1233.3 [P.O.:500; I.V.:466.1; IV Piggyback:267.2] Out: 1250 [Urine:1000; Drains:100; Stool:150] Intake/Output this shift: Total I/O In: -  Out: 250 [Stool:250]  General appearance: alert and cooperative Resp: clear to auscultation bilaterally Cardio: regular rate and rhythm GI: soft, moderate tenderness on right  Lab Results:  Recent Labs    04/07/24 0440 04/08/24 0440  WBC 15.3* 11.5*  HGB 7.3* 7.7*  HCT 23.6* 24.9*  PLT 416* 497*   BMET Recent Labs    04/08/24 0440 04/09/24 0527  NA 139 136  K 3.6 4.1  CL 104 103  CO2 22 21*  GLUCOSE 81 124*  BUN 7* 6*  CREATININE 0.72 0.63  CALCIUM  8.3* 8.4*   PT/INR No results for input(s): LABPROT, INR in the last 72 hours. ABG No results for input(s): PHART, HCO3 in the last 72 hours.  Invalid input(s): PCO2, PO2  Studies/Results: US  EKG SITE RITE Result Date: 04/08/2024 If Site Rite image not attached, placement could not be confirmed due to current cardiac rhythm.  DG Abd Portable 1V Result Date: 04/08/2024 CLINICAL DATA:  Ileus. Laparotomy with ileostomy closure 03/29/2024. EXAM: PORTABLE ABDOMEN - 1 VIEW COMPARISON:  01/05/2020 and CT 03/26/2024 FINDINGS: Catheter from below with tip over the left mid abdomen. Surgical suture line over the right lower quadrant. There are several air-filled mildly dilated small bowel loops in the central abdomen measuring up to 4.6 cm in diameter. Slightly increased number of these dilated  bowel loops compared to the recent CT. No free peritoneal air. Contrast within the colon. Remainder of the exam is unchanged. IMPRESSION: Several air-filled mildly dilated small bowel loops in the central abdomen measuring up to 4.6 cm in diameter. Slightly increased number of these dilated bowel loops compared to the recent CT. Findings may be due to persistent postoperative ileus versus small bowel obstruction. Electronically Signed   By: Toribio Agreste M.D.   On: 04/08/2024 10:25    Anti-infectives: Anti-infectives (From admission, onward)    Start     Dose/Rate Route Frequency Ordered Stop   04/03/24 1415  piperacillin -tazobactam (ZOSYN ) IVPB 3.375 g  Status:  Discontinued        3.375 g 12.5 mL/hr over 240 Minutes Intravenous Every 8 hours 04/03/24 1403 04/08/24 0903   04/03/24 0300  piperacillin -tazobactam (ZOSYN ) IVPB 3.375 g        3.375 g 100 mL/hr over 30 Minutes Intravenous  Once 04/03/24 0252 04/03/24 0458       Assessment/Plan: s/p Procedure(s): EXPLORATORY LAPAROTOMY, LYSIS OF ADHESIONS; ILEOCECECTOMY WITH ANASTAMOSIS (N/A) Advance diet. Allow clears today Continue tpn for nutrition Ileus improving POD 5 ileocecectomy VTE - lovenox  Abx stopped yesterday  LOS: 6 days    Deward Null III 04/09/2024

## 2024-04-10 LAB — COMPREHENSIVE METABOLIC PANEL WITH GFR
ALT: 24 U/L (ref 0–44)
AST: 18 U/L (ref 15–41)
Albumin: 2.7 g/dL — ABNORMAL LOW (ref 3.5–5.0)
Alkaline Phosphatase: 84 U/L (ref 38–126)
Anion gap: 9 (ref 5–15)
BUN: 13 mg/dL (ref 8–23)
CO2: 23 mmol/L (ref 22–32)
Calcium: 8.1 mg/dL — ABNORMAL LOW (ref 8.9–10.3)
Chloride: 104 mmol/L (ref 98–111)
Creatinine, Ser: 0.52 mg/dL (ref 0.44–1.00)
GFR, Estimated: >60 mL/min (ref >60)
Glucose, Bld: 116 mg/dL — ABNORMAL HIGH (ref 70–99)
Potassium: 4 mmol/L (ref 3.5–5.1)
Sodium: 136 mmol/L (ref 135–145)
Total Bilirubin: <0.2 mg/dL (ref 0.0–1.2)
Total Protein: 5 g/dL — ABNORMAL LOW (ref 6.5–8.1)

## 2024-04-10 LAB — GLUCOSE, CAPILLARY
Glucose-Capillary: 142 mg/dL — ABNORMAL HIGH (ref 70–99)
Glucose-Capillary: 157 mg/dL — ABNORMAL HIGH (ref 70–99)
Glucose-Capillary: 166 mg/dL — ABNORMAL HIGH (ref 70–99)
Glucose-Capillary: 172 mg/dL — ABNORMAL HIGH (ref 70–99)

## 2024-04-10 LAB — MAGNESIUM: Magnesium: 1.9 mg/dL (ref 1.7–2.4)

## 2024-04-10 LAB — PHOSPHORUS: Phosphorus: 2.9 mg/dL (ref 2.5–4.6)

## 2024-04-10 MED ORDER — MAGNESIUM SULFATE IN D5W 1-5 GM/100ML-% IV SOLN
1.0000 g | Freq: Once | INTRAVENOUS | Status: AC
  Administered 2024-04-10: 1 g via INTRAVENOUS
  Filled 2024-04-10: qty 100

## 2024-04-10 MED ORDER — TRAVASOL 10 % IV SOLN
INTRAVENOUS | Status: AC
  Filled 2024-04-10: qty 950.4

## 2024-04-10 NOTE — Progress Notes (Addendum)
 6 Days Post-Op   Subjective/Chief Complaint: Husband at bs No c/o  No n/v Some burping; 5BMs recorded Tolerating cld ambulated  Objective: Vital signs in last 24 hours: Temp:  [97.9 F (36.6 C)-98.2 F (36.8 C)] 97.9 F (36.6 C) (10/05 0616) Pulse Rate:  [74-77] 74 (10/05 0616) Resp:  [16-20] 20 (10/05 0616) BP: (111-126)/(50-55) 126/53 (10/05 0616) SpO2:  [94 %-97 %] 95 % (10/05 0616) Weight:  [69.5 kg] 69.5 kg (10/05 0500) Last BM Date : 04/10/24  Intake/Output from previous day: 10/04 0701 - 10/05 0700 In: 2112.6 [P.O.:300; I.V.:1554.3; IV Piggyback:258.3] Out: 1175 [Urine:800; Drains:25; Stool:350] Intake/Output this shift: No intake/output data recorded.  Alert, nontoxic Soft, mild distension, wound vac intact - dark fluid in cannister   Lab Results:  Recent Labs    04/08/24 0440  WBC 11.5*  HGB 7.7*  HCT 24.9*  PLT 497*   BMET Recent Labs    04/09/24 0527 04/10/24 0311  NA 136 136  K 4.1 4.0  CL 103 104  CO2 21* 23  GLUCOSE 124* 116*  BUN 6* 13  CREATININE 0.63 0.52  CALCIUM  8.4* 8.1*   PT/INR No results for input(s): LABPROT, INR in the last 72 hours. ABG No results for input(s): PHART, HCO3 in the last 72 hours.  Invalid input(s): PCO2, PO2  Studies/Results: US  EKG SITE RITE Result Date: 04/08/2024 If Site Rite image not attached, placement could not be confirmed due to current cardiac rhythm.   Anti-infectives: Anti-infectives (From admission, onward)    Start     Dose/Rate Route Frequency Ordered Stop   04/03/24 1415  piperacillin -tazobactam (ZOSYN ) IVPB 3.375 g  Status:  Discontinued        3.375 g 12.5 mL/hr over 240 Minutes Intravenous Every 8 hours 04/03/24 1403 04/08/24 0903   04/03/24 0300  piperacillin -tazobactam (ZOSYN ) IVPB 3.375 g        3.375 g 100 mL/hr over 30 Minutes Intravenous  Once 04/03/24 0252 04/03/24 0458       Assessment/Plan: s/p Procedure(s): EXPLORATORY LAPAROTOMY, LYSIS OF ADHESIONS;  ILEOCECECTOMY WITH ANASTAMOSIS 04/04/2024    FEN - adv to FLD, breeze; cont TPN VTE - lovenox  ID - stop zosyn  Disposition - adv to FLD, cont TPN; hopefully start weaning TPN tomorrow; looks like TOC already on board - will need home wound vac    LOS: 5 days    I reviewed last 24 h vitals and pain scores, last 48 h intake and output, last 24 h labs and trends, and last 24 h imaging results.  LOS: 7 days    Camellia Blush 04/10/2024

## 2024-04-10 NOTE — Progress Notes (Signed)
 PHARMACY - TOTAL PARENTERAL NUTRITION CONSULT NOTE   Indication: Prolonged ileus  Patient Measurements: Height: 5' 3 (160 cm) Weight: 69.5 kg (153 lb 3.5 oz) IBW/kg (Calculated) : 52.4 TPN AdjBW (KG): 55.8 Body mass index is 27.14 kg/m. Usual Weight:   Assessment: Patient is a 68 y.o F with hx diverticulitis and stoma placement who underwent ileostomy closure on 03/29/24.  She was discharged from [facility] on 03/31/24 and returned back to the ED on 04/03/24 with c/o drainage from surgical site.  She was taken to the OR on 04/04/24 for lysis of adhesions and ileocecectomy with ileocolic anastomosis.  Pharmacy consulted on 04/08/24 to start TPN for prolonged ileus.  Glucose / Insulin: no hx DM - on sSSI q6h (used 4 units since TPN rate increased yesterday- est 8 hrs in 24 hrs) - CBGs (goal <150): 116-166 since TPN rate increased at 6p on 10/4 Electrolytes: K 4, Mag 1.9; other lytes wnl including CorrCa - Goal for ileus: K >4, Mag >2 Renal: scr <1 (crcl~61), BUN wnl Hepatic: LFTs wnl - Albumin  low at 2.7 Intake / Output; MIVF: not on mIVF - I/O: +919mL - drain: 25 mL - UOP: 800 mL  - LBM: 10/5 GI Imaging: - 9/28: CT and/pelvis: Postoperative changes from colostomy reversal. Small fluid collection noted in the anterior abdomen at the prior ostomy level. Dilated small bowel loops into the pelvis. Terminal ileum is decompressed. Cannot exclude distal small bowel obstruction. - 10/3 abd xray: Several air-filled mildly dilated small bowel loops in the central Abdomen. Findings may be due to persistent postoperative ileus versus small bowel obstruction. GI Surgeries / Procedures:  - 9/23 ileostomy closure - 9/28 exploratory lapartomy, lysis of adhesions, ileocecectomy with anastamosis   Central access: PICC placed on 10/3 TPN start date: 10/3  Nutritional Goals: Goal TPN rate is 90 mL/hr (provides 95 g of protein and 2044 kcals per day)  RD Assessment: Estimated Needs Total Energy Estimated  Needs: 1900-2100 Total Protein Estimated Needs: 90-105g Total Fluid Estimated Needs: 2L/day  Current Nutrition:  - TPN  - Boost 1 container TID (refused most doses) - Juven 1 packet bid   - 10/4: adv to clear liquid diet  Plan:   Now:  - Magnesium  sulfate 1gm IV x1  At 1800:  - Increase TPN to goal rate 90 mL/hr at 1800 - Electrolytes in TPN:  Na 6mEq/L K 50mEq/L Ca 49mEq/L Mg 55mEq/L Phos 15mmol/L Cl:Ac 1:2 - Add standard MVI and trace elements to TPN - Continue sensitive SSI q6h and adjust as needed  - thiamine 100 mg daily x5 days (10/4 - 10/8) - Monitor TPN labs on Mon/Thurs at a minimum  Jenilee Franey P 04/10/2024,8:17 AM

## 2024-04-11 DIAGNOSIS — E440 Moderate protein-calorie malnutrition: Secondary | ICD-10-CM | POA: Insufficient documentation

## 2024-04-11 DIAGNOSIS — E43 Unspecified severe protein-calorie malnutrition: Secondary | ICD-10-CM | POA: Insufficient documentation

## 2024-04-11 LAB — COMPREHENSIVE METABOLIC PANEL WITH GFR
ALT: 20 U/L (ref 0–44)
AST: 16 U/L (ref 15–41)
Albumin: 2.8 g/dL — ABNORMAL LOW (ref 3.5–5.0)
Alkaline Phosphatase: 82 U/L (ref 38–126)
Anion gap: 9 (ref 5–15)
BUN: 16 mg/dL (ref 8–23)
CO2: 22 mmol/L (ref 22–32)
Calcium: 8.6 mg/dL — ABNORMAL LOW (ref 8.9–10.3)
Chloride: 103 mmol/L (ref 98–111)
Creatinine, Ser: 0.51 mg/dL (ref 0.44–1.00)
GFR, Estimated: >60 mL/min (ref >60)
Glucose, Bld: 128 mg/dL — ABNORMAL HIGH (ref 70–99)
Potassium: 4.3 mmol/L (ref 3.5–5.1)
Sodium: 135 mmol/L (ref 135–145)
Total Bilirubin: 0.2 mg/dL (ref 0.0–1.2)
Total Protein: 5.4 g/dL — ABNORMAL LOW (ref 6.5–8.1)

## 2024-04-11 LAB — GLUCOSE, CAPILLARY
Glucose-Capillary: 118 mg/dL — ABNORMAL HIGH (ref 70–99)
Glucose-Capillary: 122 mg/dL — ABNORMAL HIGH (ref 70–99)
Glucose-Capillary: 134 mg/dL — ABNORMAL HIGH (ref 70–99)
Glucose-Capillary: 144 mg/dL — ABNORMAL HIGH (ref 70–99)

## 2024-04-11 LAB — CBC
HCT: 22.7 % — ABNORMAL LOW (ref 36.0–46.0)
Hemoglobin: 7 g/dL — ABNORMAL LOW (ref 12.0–15.0)
MCH: 28.2 pg (ref 26.0–34.0)
MCHC: 30.8 g/dL (ref 30.0–36.0)
MCV: 91.5 fL (ref 80.0–100.0)
Platelets: 488 K/uL — ABNORMAL HIGH (ref 150–400)
RBC: 2.48 MIL/uL — ABNORMAL LOW (ref 3.87–5.11)
RDW: 15.6 % — ABNORMAL HIGH (ref 11.5–15.5)
WBC: 15.2 K/uL — ABNORMAL HIGH (ref 4.0–10.5)
nRBC: 0.7 % — ABNORMAL HIGH (ref 0.0–0.2)

## 2024-04-11 LAB — PHOSPHORUS: Phosphorus: 3.4 mg/dL (ref 2.5–4.6)

## 2024-04-11 LAB — TRIGLYCERIDES: Triglycerides: 140 mg/dL (ref <150)

## 2024-04-11 LAB — MAGNESIUM: Magnesium: 2.1 mg/dL (ref 1.7–2.4)

## 2024-04-11 LAB — PREPARE RBC (CROSSMATCH)

## 2024-04-11 MED ORDER — TRAVASOL 10 % IV SOLN
INTRAVENOUS | Status: DC
  Filled 2024-04-11: qty 475.2

## 2024-04-11 MED ORDER — PANTOPRAZOLE SODIUM 40 MG PO TBEC
40.0000 mg | DELAYED_RELEASE_TABLET | Freq: Every day | ORAL | Status: DC
  Administered 2024-04-11 – 2024-04-12 (×2): 40 mg via ORAL
  Filled 2024-04-11 (×2): qty 1

## 2024-04-11 MED ORDER — THIAMINE MONONITRATE 100 MG PO TABS
100.0000 mg | ORAL_TABLET | Freq: Every day | ORAL | Status: DC
  Administered 2024-04-11 – 2024-04-12 (×2): 100 mg via ORAL
  Filled 2024-04-11 (×2): qty 1

## 2024-04-11 MED ORDER — SODIUM CHLORIDE 0.9% IV SOLUTION
Freq: Once | INTRAVENOUS | Status: AC
Start: 2024-04-11 — End: 2024-04-11

## 2024-04-11 MED ORDER — ADULT MULTIVITAMIN W/MINERALS CH
1.0000 | ORAL_TABLET | Freq: Every day | ORAL | Status: DC
Start: 2024-04-11 — End: 2024-04-12
  Administered 2024-04-11 – 2024-04-12 (×2): 1 via ORAL
  Filled 2024-04-11 (×2): qty 1

## 2024-04-11 NOTE — TOC Progression Note (Signed)
 Transition of Care [facility]) - Progression Note    Patient Details  Name: Olivia [name] MRN: [id number] Date of Birth: 06/27/1956  Transition [facility] CM/SW Contact  Shameeka Silliman, Nathanel, RN Phone Number: 04/11/2024, 10:43 AM  Clinical Narrative:Noted for home wound vac-MD aware of [employer] to send orders to Dr. Roselynn email address [email or web address]. HHRN order fo woun vac dsg changes await face to face order. Noted has [facility] on TPN, soft diet. [employer] rep Pam following if TPN needed.       Expected Discharge Plan: Home w Home Health Services Barriers to Discharge: Continued Medical Work up               Expected Discharge Plan and Services   Discharge Planning Services: CM Consult Post Acute Care Choice: Home Health Living arrangements for the past 2 months: Single Family Home                   DME Agency: [employer] Date DME Agency Contacted: 04/11/24 Time DME Agency Contacted: 1042 Representative spoke with at DME Agency: Randine HH Arranged: RN HH Agency: Other - See comment ([facility]) Date [facility] Agency Contacted: 04/11/24 Time HH Agency Contacted: 1043 Representative spoke with at [facility] Agency: [facility]   Social Drivers of Health (SDOH) Interventions SDOH Screenings   Food Insecurity: No Food Insecurity (04/03/2024)  Housing: Low Risk  (04/03/2024)  Transportation Needs: No Transportation Needs (04/03/2024)  Utilities: Not At Risk (04/03/2024)  Depression (PHQ2-9): Low Risk  (12/31/2023)  Social Connections: Moderately Integrated (04/03/2024)  Tobacco Use: Medium Risk (04/02/2024)    Readmission Risk Interventions    04/04/2024    9:35 AM 03/30/2024   10:37 AM 01/13/2024    2:33 PM  Readmission Risk Prevention Plan  Post Dischage Appt   Complete  Medication Screening   Complete  Transportation Screening Complete Complete Complete  PCP or Specialist Appt within 5-7 Days  Complete   PCP or Specialist Appt within 3-5 Days Complete    Home Care Screening  Complete    Medication Review (RN CM)  Complete   HRI or Home Care Consult Complete    Social Work Consult for Recovery Care Planning/Counseling Complete    Palliative Care Screening Complete    Medication Review Oceanographer) Complete

## 2024-04-11 NOTE — Progress Notes (Signed)
 Mobility Specialist - Progress Note   04/11/24 1000  Mobility  Activity Ambulated with assistance  Level of Assistance Standby assist, set-up cues, supervision of patient - no hands on  Assistive Device Other (Comment) (iv pole)  Distance Ambulated (ft) 500 ft  Range of Motion/Exercises Active  Activity Response Tolerated well  Mobility Referral Yes  Mobility visit 1 Mobility  Mobility Specialist Start Time (ACUTE ONLY) [id number]  Mobility Specialist Stop Time (ACUTE ONLY) 0949  Mobility Specialist Time Calculation (min) (ACUTE ONLY) 13 min   Received in chair and agreed to mobility. Upon arrival, pt endorsed pain at 8/10 to nurse. Continued to ambulate, pain eased, did not give number. No other complaints, returned to chair with all needs met.  Cyndee Ada Mobility Specialist

## 2024-04-11 NOTE — Progress Notes (Signed)
 PHARMACY - TOTAL PARENTERAL NUTRITION CONSULT NOTE   Indication: Prolonged ileus  Patient Measurements: Height: 5' 3 (160 cm) Weight: 67.8 kg (149 lb 7.6 oz) IBW/kg (Calculated) : 52.4 TPN AdjBW (KG): 55.8 Body mass index is 26.48 kg/m. Usual Weight:   Assessment: Patient is a 68 y.o F with hx diverticulitis and stoma placement who underwent ileostomy closure on 03/29/24.  She was discharged from [facility] on 03/31/24 and returned back to the ED on 04/03/24 with c/o drainage from surgical site.  She was taken to the OR on 04/04/24 for lysis of adhesions and ileocecectomy with ileocolic anastomosis.  Pharmacy consulted on 04/08/24 to start TPN for prolonged ileus.  Glucose / Insulin: no hx DM - on sSSI q6h: 4 units /24 hrs.   - CBGs (goal 140-180): 122-172 Electrolytes: WNL - Goal for ileus: K >4, Mag >2 Renal: scr <1 (crcl~61), BUN wnl Hepatic: LFTs wnl - Albumin  low at 2.8 - Trig 140 Intake / Output; MIVF: - IVF: LR @ 20 - Output: Urine output 1100 mL (x2 occurrences), stool x4, no drain output charted GI Imaging: - 9/28: CT and/pelvis: Postoperative changes from colostomy reversal. Small fluid collection noted in the anterior abdomen at the prior ostomy level. Dilated small bowel loops into the pelvis. Terminal ileum is decompressed. Cannot exclude distal small bowel obstruction. - 10/3 abd xray: Several air-filled mildly dilated small bowel loops in the central Abdomen. Findings may be due to persistent postoperative ileus versus small bowel obstruction. GI Surgeries / Procedures:  - 9/23 ileostomy closure - 9/28 exploratory lapartomy, lysis of adhesions, ileocecectomy with anastamosis   Central access: PICC placed on 10/3 TPN start date: 10/3  Nutritional Goals: Goal TPN rate is 90 mL/hr (provides 95 g of protein and 2044 kcals per day)  RD Assessment: Estimated Needs Total Energy Estimated Needs: 1900-2100 Total Protein Estimated Needs: 90-105g Total Fluid Estimated Needs:  2L/day  Current Nutrition:  - TPN  - Advance to soft diet on 10/6 - Nutritional supplements ordered:  Boost TID, Juven 1 packet bid   Plan:  At 1800:  - Decrease TPN to 1/2 rate at 45 mL/hr - Electrolytes in TPN:  Na 26mEq/L K 50mEq/L Ca 23mEq/L Mg 13mEq/L Phos 15mmol/L Cl:Ac 1:2 - Change to oral MVI daily and thiamine daily x5 days (10/4-10/8) - Continue sensitive SSI q6h and adjust as needed  - Monitor TPN labs on Mon/Thurs at a minimum    Wanda Hasting PharmD, BCPS [facility] main pharmacy [phone] 04/11/2024 7:30 AM

## 2024-04-11 NOTE — Plan of Care (Signed)
 Discussed earlier with patient plan of care for the evning, pain management and medications with some teach back displayed.  Also, non medication measures for pain control.  Problem: Health Behavior/Discharge Planning: Goal: Ability to manage health-related needs will improve Outcome: Progressing   Problem: Activity: Goal: Risk for activity intolerance will decrease Outcome: Not Progressing   Problem: Pain Managment: Goal: General experience of comfort will improve and/or be controlled Outcome: Not Progressing

## 2024-04-11 NOTE — Consult Note (Signed)
 WOC Nurse wound follow up Wound type: surgical Measurement: 14.5 cm x 2 cm x 2.5 cm. Ileocecectomy with anastomosis (colostomy closed) 2 cm x 3 cm, tunneling with 3 cm at 3 o'clock.  Wound bed: 75 % red, moist, 25% yellow slough Drainage (amount, consistency, odor) serosanguinous in cannister Periwound: intact Dressing procedure/placement/frequency:  Removed old NPWT dressing  Filled wound with   _2___ piece of black foam  Sealed NPWT dressing at HG Patient received IV pain medication per bedside nurse prior to dressing change Patient tolerated procedure well.  WOC nurse will continue to provide NPWT dressing changed due to the complexity of the dressing change.          Thank you,  Doyal Polite, MSN, RN, [facility] WOC Team [phone] (Available Mon-Fri 0700-1500)

## 2024-04-11 NOTE — Progress Notes (Signed)
  7 Days Post-Op   Chief Complaint/Subjective: Some nausea overnight, +BMs  Objective: Vital signs in last 24 hours: Temp:  [97.4 F (36.3 C)-99.2 F (37.3 C)] 98.8 F (37.1 C) (10/06 0611) Pulse Rate:  [73-75] 73 (10/06 0611) Resp:  [16-18] 16 (10/06 0611) BP: (117-128)/(54-60) 127/54 (10/06 0611) SpO2:  [96 %-99 %] 98 % (10/06 0611) Weight:  [67.8 kg] 67.8 kg (10/06 0500) Last BM Date : 04/10/24 Intake/Output from previous day: 10/05 0701 - 10/06 0700 In: 3285.5 [P.O.:720; I.V.:2565.5] Out: 1100 [Urine:1100]  PE: Gen: NAD Resp: nonlabored Card: RRR Abd: soft, vac in place  Lab Results:  Recent Labs    04/11/24 0033  WBC 15.2*  HGB 7.0*  HCT 22.7*  PLT 488*   Recent Labs    04/10/24 0311 04/11/24 0033  NA 136 135  K 4.0 4.3  CL 104 103  CO2 23 22  GLUCOSE 116* 128*  BUN 13 16  CREATININE 0.52 0.51  CALCIUM  8.1* 8.6*   No results for input(s): LABPROT, INR in the last 72 hours.    Component Value Date/Time   NA 135 04/11/2024 0033   NA 143 12/07/2019 0751   K 4.3 04/11/2024 0033   CL 103 04/11/2024 0033   CO2 22 04/11/2024 0033   GLUCOSE 128 (H) 04/11/2024 0033   BUN 16 04/11/2024 0033   BUN 15 12/07/2019 0751   CREATININE 0.51 04/11/2024 0033   CREATININE 0.79 12/31/2023 1027   CALCIUM  8.6 (L) 04/11/2024 0033   PROT 5.4 (L) 04/11/2024 0033   PROT 6.4 09/07/2020 1311   ALBUMIN  2.8 (L) 04/11/2024 0033   ALBUMIN  3.9 09/07/2020 1311   AST 16 04/11/2024 0033   ALT 20 04/11/2024 0033   ALKPHOS 82 04/11/2024 0033   BILITOT 0.2 04/11/2024 0033   BILITOT <0.2 09/07/2020 1311   GFRNONAA >60 04/11/2024 0033   GFRAA >60 01/03/2020 1357    Assessment/Plan  s/p Procedure(s): EXPLORATORY LAPAROTOMY, LYSIS OF ADHESIONS; ILEOCECECTOMY WITH ANASTAMOSIS 04/04/2024  ABLA on chronic anemia -transfuse 1 pRBC -half rate TPN  FEN - soft diet VTE - lovenox  ID - completed course Disposition - inpatient   LOS: 8 days   I reviewed last 24 h vitals  and pain scores, last 48 h intake and output, last 24 h labs and trends, and last 24 h imaging results.  This care required moderate level of medical decision making.   [name] [facility] at [facility] 04/11/2024, 7:41 AM Please see Amion for pager number during day hours 7:00am-4:30pm or 7:00am -11:30am on weekends

## 2024-04-12 LAB — TYPE AND SCREEN
ABO/RH(D): A POS
Antibody Screen: NEGATIVE
Unit division: 0

## 2024-04-12 LAB — CBC
HCT: 29.5 % — ABNORMAL LOW (ref 36.0–46.0)
Hemoglobin: 9 g/dL — ABNORMAL LOW (ref 12.0–15.0)
MCH: 27.4 pg (ref 26.0–34.0)
MCHC: 30.5 g/dL (ref 30.0–36.0)
MCV: 89.7 fL (ref 80.0–100.0)
Platelets: 502 K/uL — ABNORMAL HIGH (ref 150–400)
RBC: 3.29 MIL/uL — ABNORMAL LOW (ref 3.87–5.11)
RDW: 16.4 % — ABNORMAL HIGH (ref 11.5–15.5)
WBC: 14.4 K/uL — ABNORMAL HIGH (ref 4.0–10.5)
nRBC: 0.6 % — ABNORMAL HIGH (ref 0.0–0.2)

## 2024-04-12 LAB — BPAM RBC
Blood Product Expiration Date: 202510272359
ISSUE DATE / TIME: 202510061556
Unit Type and Rh: 6200

## 2024-04-12 LAB — GLUCOSE, CAPILLARY
Glucose-Capillary: 102 mg/dL — ABNORMAL HIGH (ref 70–99)
Glucose-Capillary: 105 mg/dL — ABNORMAL HIGH (ref 70–99)
Glucose-Capillary: 115 mg/dL — ABNORMAL HIGH (ref 70–99)

## 2024-04-12 NOTE — TOC Transition Note (Addendum)
 Transition of Care [facility]) - Discharge Note   Patient Details  Name: Olivia [name] MRN: [id number] Date of Birth: 1955-08-13  Transition of Care [facility]) CM/SW Contact:  Bascom Service, RN Phone Number: 04/12/2024, 9:25 AM   Clinical Narrative: d/c home w/HHRN-[facility] rep Leah-awaiting face to face order for home wound vac dsg changes M-W-F. [employer] wound vac to be delivered to rm prior d/c.Form for release of liability to be signed by patient.Has own transport home. -10a-faxed w/confirmation HHC/face to face order to [facility] for [facility] dsg changes. -11;25a confirmed HHC services rep received all info needed. Has own transport home. No further CM needs.    Final next level of care: Home w Home Health Services Barriers to Discharge: No Barriers Identified   Patient Goals and CMS Choice Patient states their goals for this hospitalization and ongoing recovery are:: Home CMS Medicare.gov Compare Post Acute Care list provided to:: Patient Choice offered to / list presented to : Patient [place] ownership interest in [facility].provided to:: Patient    Discharge Placement                       Discharge Plan and Services Additional resources added to the After Visit Summary for     Discharge Planning Services: CM Consult Post Acute Care Choice: Home Health            DME Agency: [employer] Date DME Agency Contacted: 04/12/24 Time DME Agency Contacted: [phone] Representative spoke with at DME Agency: Randine HH Arranged: RN [facility] Agency: Other - See comment ([facility]) Date [facility] Agency Contacted: 04/12/24 Time HH Agency Contacted: [phone] Representative spoke with at [facility] Agency: Rea  Social Drivers of Health (SDOH) Interventions SDOH Screenings   Food Insecurity: No Food Insecurity (04/03/2024)  Housing: Low Risk  (04/03/2024)  Transportation Needs: No Transportation Needs (04/03/2024)  Utilities: Not At Risk (04/03/2024)  Depression (PHQ2-9): Low Risk  (12/31/2023)  Social  Connections: Moderately Integrated (04/03/2024)  Tobacco Use: Medium Risk (04/02/2024)     Readmission Risk Interventions    04/04/2024    9:35 AM 03/30/2024   10:37 AM 01/13/2024    2:33 PM  Readmission Risk Prevention Plan  Post Dischage Appt   Complete  Medication Screening   Complete  Transportation Screening Complete Complete Complete  PCP or Specialist Appt within 5-7 Days  Complete   PCP or Specialist Appt within 3-5 Days Complete    Home Care Screening  Complete   Medication Review (RN CM)  Complete   HRI or Home Care Consult Complete    Social Work Consult for Recovery Care Planning/Counseling Complete    Palliative Care Screening Complete    Medication Review Oceanographer) Complete

## 2024-04-12 NOTE — Progress Notes (Signed)
 PHARMACY - TOTAL PARENTERAL NUTRITION CONSULT NOTE   Indication: Prolonged ileus  Patient Measurements: Height: 5' 3 (160 cm) Weight: 63.9 kg (140 lb 14 oz) IBW/kg (Calculated) : 52.4 TPN AdjBW (KG): 55.8 Body mass index is 24.95 kg/m. Usual Weight:   Assessment: Patient is a 68 y.o F with hx diverticulitis and stoma placement who underwent ileostomy closure on 03/29/24.  She was discharged from [facility] on 03/31/24 and returned back to the ED on 04/03/24 with c/o drainage from surgical site.  She was taken to the OR on 04/04/24 for lysis of adhesions and ileocecectomy with ileocolic anastomosis.  Pharmacy consulted on 04/08/24 to start TPN for prolonged ileus.  Glucose / Insulin: no hx DM - on sSSI q6h: 4 units /24 hrs.   - CBGs (goal 140-180): 102-134 Electrolytes: WNL (10/6); Goal for ileus: K >4, Mag >2 Renal: scr <1 (crcl~61), BUN wnl Hepatic: LFTs wnl - Albumin  low at 2.8 - Trig 140 Intake / Output; MIVF: - IVF: LR @ 20 - Output: Urine output 2400 mL, stool x0 GI Imaging: - 9/28: CT and/pelvis: Postoperative changes from colostomy reversal. Small fluid collection noted in the anterior abdomen at the prior ostomy level. Dilated small bowel loops into the pelvis. Terminal ileum is decompressed. Cannot exclude distal small bowel obstruction. - 10/3 abd xray: Several air-filled mildly dilated small bowel loops in the central Abdomen. Findings may be due to persistent postoperative ileus versus small bowel obstruction. GI Surgeries / Procedures:  - 9/23 ileostomy closure - 9/28 exploratory lapartomy, lysis of adhesions, ileocecectomy with anastamosis   Central access: PICC placed on 10/3 TPN start date: 10/3  Nutritional Goals: Goal TPN rate is 90 mL/hr (provides 95 g of protein and 2044 kcals per day)  RD Assessment: Estimated Needs Total Energy Estimated Needs: 1900-2100 Total Protein Estimated Needs: 90-105g Total Fluid Estimated Needs: 2L/day  Current Nutrition:  - TPN  -  Advance to soft diet on 10/6 - Nutritional supplements ordered:  Boost TID, Juven 1 packet bid   Plan:  Noted plans for discharge today.  Will not order another TPN today.    Wanda Hasting PharmD, BCPS [facility] main pharmacy [phone] 04/12/2024 7:33 AM

## 2024-04-12 NOTE — Discharge Summary (Signed)
 Physician Discharge Summary  Olivia [name] FMW:[id number] DOB: [birth year] DOA: 04/02/2024  PCP: Elvis Ditch, PA-C  Admit date: 04/02/2024 Discharge date:  04/12/2024   Recommendations for Outpatient Follow-up:  Home vac wound care 2 times weekly (include homehealth, outpatient follow-up instructions, specific recommendations for PCP to follow-up on, etc.)   Follow-up Information     Eathan Groman, [name], MD Follow up on 04/20/2024.   Specialty: General Surgery Contact information: [address] [place] [address] [phone]                Discharge Diagnoses:  Principal Problem:   Enterocutaneous fistula Active Problems:   Malnutrition of moderate degree   Surgical Procedure: ileocecectomy with anastomosis  Discharge Condition: Good Disposition: Home  Diet recommendation: soft diet   Hospital Course:  68 yo female represented with drainage from wound and sepsis after ileostomy reversal. She was taken to the OR where additional small intestine and a portion of large intestine was resected with new ileocolic anastomosis. She was admitted post op. She had NG tube removed POD 2 but then had an ileus. She was started on TPN POD 5 and then had bowel movement POD 6. She was slowly advanced on diet and discharged home 04/12/2024  Discharge Instructions  Discharge Instructions     Diet - low sodium heart healthy   Complete by: As directed    Discharge wound care:   Complete by: As directed    Home vac care for wound   Increase activity slowly   Complete by: As directed       Allergies as of 04/12/2024       Reactions   Codeine    Headache-draws line across forehead        Medication List     TAKE these medications    acidophilus Caps capsule Take 1 capsule by mouth daily.   amiodarone  100 MG tablet Commonly known as: PACERONE  Take 100 mg by mouth at bedtime.   amLODipine  5 MG tablet Commonly known as: NORVASC  Take 5 mg  by mouth in the morning and at bedtime.   atorvastatin  80 MG tablet Commonly known as: LIPITOR Take 80 mg by mouth at bedtime.   calcium -vitamin D 500-5 MG-MCG tablet Commonly known as: OSCAL WITH D Take 1 tablet by mouth 2 (two) times daily.   cetirizine 10 MG tablet Commonly known as: ZYRTEC Take 10 mg by mouth daily.   CINNAMON  PO Take 1 tablet by mouth daily.   [employer] Ibuprofen PM 200-38 MG Tabs Generic drug: Ibuprofen-diphenhydrAMINE  Cit Take 2 tablets by mouth at bedtime as needed (sleep).   ergocalciferol 1.25 MG (50000 UT) capsule Commonly known as: VITAMIN D2 Take 50,000 Units by mouth every Wednesday.   fluticasone  50 MCG/ACT nasal spray Commonly known as: FLONASE  Place 1 spray into both nostrils daily as needed for allergies or rhinitis.   gabapentin  300 MG capsule Commonly known as: NEURONTIN  Take 300 mg by mouth 3 (three) times daily.   glucosamine-chondroitin 500-400 MG tablet Take 1 tablet by mouth daily.   latanoprost  0.005 % ophthalmic solution Commonly known as: XALATAN  Place 1 drop into both eyes at bedtime.   MAGNESIUM  GUMMIES PO Take 2 tablets by mouth daily.   metoprolol  succinate 50 MG 24 hr tablet Commonly known as: TOPROL -XL Take 50 mg by mouth 2 (two) times daily.   predniSONE  5 MG tablet Commonly known as: DELTASONE  Take 5 mg by mouth daily with breakfast.   sodium chloride  flush 0.9 %  Soln Commonly known as: NS 10 mLs by Intracatheter route as needed (flush).   sulfaSALAzine  500 MG tablet Commonly known as: AZULFIDINE  Take 1,000 mg by mouth 2 (two) times daily.   traMADol  50 MG tablet Commonly known as: ULTRAM  Take 1 tablet (50 mg total) by mouth every 6 (six) hours as needed for moderate pain (pain score 4-6) or severe pain (pain score 7-10).   VITAMIN B-12 IJ Inject 1,000 mcg into the muscle once a week.   VITAMIN B 12 PO Place 3,000 mcg under the tongue daily.   Xarelto  20 MG Tabs tablet Generic drug: rivaroxaban  Take  20 mg by mouth daily.               Discharge Care Instructions  (From admission, onward)           Start     Ordered   04/12/24 0000  Discharge wound care:       Comments: Home vac care for wound   04/12/24 9082            Follow-up Information     Jajaira Ruis, [name], MD Follow up on 04/20/2024.   Specialty: General Surgery Contact information: [address] [place] [address] [phone]                  The results of significant diagnostics from this hospitalization (including imaging, microbiology, ancillary and laboratory) are listed below for reference.    Significant Diagnostic Studies: US  EKG SITE RITE Result Date: 04/08/2024 If Site Rite image not attached, placement could not be confirmed due to current cardiac rhythm.  DG Abd Portable 1V Result Date: 04/08/2024 CLINICAL DATA:  Ileus. Laparotomy with ileostomy closure 03/29/2024. EXAM: PORTABLE ABDOMEN - 1 VIEW COMPARISON:  01/05/2020 and CT 03/26/2024 FINDINGS: Catheter from below with tip over the left mid abdomen. Surgical suture line over the right lower quadrant. There are several air-filled mildly dilated small bowel loops in the central abdomen measuring up to 4.6 cm in diameter. Slightly increased number of these dilated bowel loops compared to the recent CT. No free peritoneal air. Contrast within the colon. Remainder of the exam is unchanged. IMPRESSION: Several air-filled mildly dilated small bowel loops in the central abdomen measuring up to 4.6 cm in diameter. Slightly increased number of these dilated bowel loops compared to the recent CT. Findings may be due to persistent postoperative ileus versus small bowel obstruction. Electronically Signed   By: Toribio Agreste M.D.   On: 04/08/2024 10:25   CT ABDOMEN PELVIS WO CONTRAST Result Date: 04/03/2024 CLINICAL DATA:  68 year old female with abdominal pain. History of colostomy reversal with fluid collection and  architectural distortion in the peritoneal cavity at the exiting ostomy loop. Follow-up exam with oral contrast. EXAM: CT ABDOMEN AND PELVIS WITHOUT CONTRAST TECHNIQUE: Multidetector CT imaging of the abdomen and pelvis was performed following the standard protocol without IV contrast. RADIATION DOSE REDUCTION: This exam was performed according to the departmental dose-optimization program which includes automated exposure control, adjustment of the mA and/or kV according to patient size and/or use of iterative reconstruction technique. COMPARISON:  CT Abdomen and Pelvis with IV contrast 0159 hours today. FINDINGS: Lower chest: Trace pleural fluid and patchy, irregular lung base opacity unchanged from earlier. No significant pericardial effusion. Hepatobiliary: Stable pneumobilia. Perihepatic free fluid along the right lobe of the liver appears more hyperdense now, up to 42 Hounsfield units which is an increase of up to 30 Hounsfield units from earlier  today. Liver parenchyma appears stable. Gallbladder surgically absent. Pancreas: Stable, negative. Spleen: Stable and diminutive. Adrenals/Urinary Tract: Stable, negative adrenal glands. Nonobstructed kidneys. Diminutive ureters. Oral contrast now within the urinary bladder. Pelvic phleboliths. Stomach/Bowel: Increasingly fluid distended, dilated small bowel loops throughout the abdomen. Oral contrast was administered. The leading edge of oral contrast is in the left abdominal loop seen on coronal image 56. Upstream of that loop dilated fluid containing nonenhancing small bowel loops converge on the ostomy site where there seems to gradual transition to nondilated loops approaching the ostomy. And mild regional architectural distortion better demonstrated on the earlier exam. Stomach and duodenum also demonstrate contrast in the lumen. Multiple duodenal diverticula are identified including that on series 2, image 24. No pneumoperitoneum is identified. Postoperative  changes the ventral abdominal wall with mesh. Trace subcutaneous gas near the ostomy loop. The ostomy itself appears completely decompressed. Decompressed large bowel from the distal transverse colon to the rectum with descending diverticulosis. Transverse colon mild redundancy, retained gas and stool. Similar retained gas and stool in the right colon. Unchanged appearance of retrocecal appendix on series 2, image 54. Vascular/Lymphatic: Extensive Aortoiliac calcified atherosclerosis. Normal caliber abdominal aorta. No lymphadenopathy identified. Reproductive: Surgically absent. Other: Pelvic inlet free fluid (series 2, image 71) appears slightly increased in density from earlier (about 10 Hounsfield units greater now up to 36 Hounsfield units). Musculoskeletal: Stable.  No acute osseous abnormality identified. IMPRESSION: 1. Oral contrast administered with appearance now of Small-bowel Obstruction; oral contrast not reaching the ostomy site and with a gradual transition from dilated to more decompressed loops in the region of ostomy and mesenteric postoperative changes subjacent to the abdominal wall. 2. Small volume of free fluid in the abdomen and pelvis appears increased in density vs CT at 0159 hours today, raising the possibility of bowel leak - although NO Pneumoperitoneum to confirm a leak or perforation. Small volume hemoperitoneum is the main differential consideration. 3. Otherwise stable CT appearance of the abdomen, pelvis, lung bases from earlier today. Aortic Atherosclerosis (ICD10-I70.0). Electronically Signed   By: VEAR Hurst M.D.   On: 04/03/2024 06:12   CT ABDOMEN PELVIS W CONTRAST Result Date: 04/03/2024 CLINICAL DATA:  Abdominal pain, postop resected colostomy reversal EXAM: CT ABDOMEN AND PELVIS WITH CONTRAST TECHNIQUE: Multidetector CT imaging of the abdomen and pelvis was performed using the standard protocol following bolus administration of intravenous contrast. RADIATION DOSE REDUCTION:  This exam was performed according to the departmental dose-optimization program which includes automated exposure control, adjustment of the mA and/or kV according to patient size and/or use of iterative reconstruction technique. CONTRAST:  OMNIPAQUE  IOHEXOL  300 MG/ML  SOLN COMPARISON:  01/22/2024 FINDINGS: Lower chest: Bibasilar atelectasis.  No effusions. Hepatobiliary: Pneumobilia again noted. No focal hepatic abnormality. Pancreas: No focal abnormality or ductal dilatation. Spleen: No focal abnormality.  Normal size. Adrenals/Urinary Tract: Scattered bilateral renal cysts which appear benign. No follow-up imaging recommended. Punctate nonobstructing stone in the midpole of the left kidney. No hydronephrosis. Adrenal glands and urinary bladder unremarkable. Stomach/Bowel: Stomach is unremarkable. 2.7 cm diverticulum noted laterally off the 1st portion of the duodenum, stable since prior study. Postoperative changes are noted in the right abdomen from cost may reversal. There are dilated fluid-filled small bowel loops in the lower abdomen and into the pelvis. Cannot exclude small bowel obstruction. Large bowel grossly unremarkable. Vascular/Lymphatic: Aortic atherosclerosis. No evidence of aneurysm or adenopathy. Reproductive: Prior hysterectomy.  No adnexal masses. Other: Free fluid in the pelvis and adjacent to the liver.  Small irregular fluid collection noted in the right abdomen anteriorly at the postoperative site. This measures approximately 3.1 x 2.6 cm on image 45. Musculoskeletal: No acute bony abnormality. IMPRESSION: Postoperative changes from colostomy reversal. Small fluid collection noted in the anterior abdomen at the prior ostomy level measuring 3.1 x 2.6 cm. This could reflect a small postoperative fluid collection. Early abscess cannot be excluded. Dilated small bowel loops into the pelvis. Terminal ileum is decompressed. Cannot exclude distal small bowel obstruction. Punctate left  nephrolithiasis.  No hydronephrosis. Small amount of free fluid in the abdomen and pelvis. Electronically Signed   By: Franky Crease M.D.   On: 04/03/2024 02:24    Labs: Basic Metabolic Panel: Recent Labs  Lab 04/06/24 0450 04/07/24 0440 04/08/24 0440 04/09/24 0527 04/10/24 0311 04/11/24 0033  NA 139 135 139 136 136 135  K 3.9 3.7 3.6 4.1 4.0 4.3  CL 105 102 104 103 104 103  CO2 22 24 22  21* 23 22  GLUCOSE 83 89 81 124* 116* 128*  BUN 14 9 7* 6* 13 16  CREATININE 0.83 0.77 0.72 0.63 0.52 0.51  CALCIUM  8.4* 8.2* 8.3* 8.4* 8.1* 8.6*  MG  --   --   --  1.8 1.9 2.1  PHOS  --   --   --  2.0* 2.9 3.4   Liver Function Tests: Recent Labs  Lab 04/10/24 0311 04/11/24 0033  AST 18 16  ALT 24 20  ALKPHOS 84 82  BILITOT <0.2 0.2  PROT 5.0* 5.4*  ALBUMIN  2.7* 2.8*    CBC: Recent Labs  Lab 04/06/24 0450 04/07/24 0440 04/08/24 0440 04/11/24 0033 04/12/24 0414  WBC 15.3* 15.3* 11.5* 15.2* 14.4*  HGB 7.6* 7.3* 7.7* 7.0* 9.0*  HCT 24.1* 23.6* 24.9* 22.7* 29.5*  MCV 94.9 94.0 94.0 91.5 89.7  PLT 395 416* 497* 488* 502*    CBG: Recent Labs  Lab 04/11/24 0606 04/11/24 1127 04/11/24 1742 04/12/24 0000 04/12/24 0612  GLUCAP 122* 134* 118* 115* 102*    Principal Problem:   Enterocutaneous fistula Active Problems:   Malnutrition of moderate degree   Time coordinating discharge: 15 min

## 2024-04-12 NOTE — Progress Notes (Signed)
 Patient to discharge to home today. All Discharge teaching including discharge Home Medications and schedules for these Medications reviewed with the Patient. Patient to be discharged home with home wound vac which has been delivered to the room.2 RNs gave Patient home teaching and hooked Patient to the wound vac. All home wound vac supplies with the Patient at time of discharge. Patient verbalized understanding of all discharge teaching/instructions. Discharge AVS with the Patient at time of discharge

## 2024-04-12 NOTE — Progress Notes (Signed)
 Order written for Patient to be discharged to home today. Patient has TPN infusing at 45/hr. MD made aware and Pharmacist consulted. TPN to be weaned off rate decreased in half for two hours and the can be discontinued.

## 2024-04-14 ENCOUNTER — Other Ambulatory Visit: Payer: Self-pay

## 2024-04-14 ENCOUNTER — Encounter (HOSPITAL_COMMUNITY): Payer: Self-pay | Admitting: Emergency Medicine

## 2024-04-14 ENCOUNTER — Inpatient Hospital Stay (HOSPITAL_COMMUNITY)
Admission: EM | Admit: 2024-04-14 | Discharge: 2024-04-26 | DRG: 393 | Disposition: A | Discharge status: Home Health | Source: Ambulatory Visit | Attending: General Surgery | Admitting: General Surgery

## 2024-04-14 DIAGNOSIS — E440 Moderate protein-calorie malnutrition: Secondary | ICD-10-CM | POA: Insufficient documentation

## 2024-04-14 DIAGNOSIS — I739 Peripheral vascular disease, unspecified: Secondary | ICD-10-CM | POA: Diagnosis present

## 2024-04-14 DIAGNOSIS — Z87891 Personal history of nicotine dependence: Secondary | ICD-10-CM

## 2024-04-14 DIAGNOSIS — E871 Hypo-osmolality and hyponatremia: Secondary | ICD-10-CM | POA: Diagnosis present

## 2024-04-14 DIAGNOSIS — Z885 Allergy status to narcotic agent status: Secondary | ICD-10-CM

## 2024-04-14 DIAGNOSIS — Z7952 Long term (current) use of systemic steroids: Secondary | ICD-10-CM

## 2024-04-14 DIAGNOSIS — E43 Unspecified severe protein-calorie malnutrition: Secondary | ICD-10-CM | POA: Diagnosis present

## 2024-04-14 DIAGNOSIS — Z7901 Long term (current) use of anticoagulants: Secondary | ICD-10-CM

## 2024-04-14 DIAGNOSIS — T8140XA Infection following a procedure, unspecified, initial encounter: Principal | ICD-10-CM

## 2024-04-14 DIAGNOSIS — Z90710 Acquired absence of both cervix and uterus: Secondary | ICD-10-CM

## 2024-04-14 DIAGNOSIS — K9189 Other postprocedural complications and disorders of digestive system: Secondary | ICD-10-CM | POA: Diagnosis not present

## 2024-04-14 DIAGNOSIS — E785 Hyperlipidemia, unspecified: Secondary | ICD-10-CM | POA: Diagnosis present

## 2024-04-14 DIAGNOSIS — I480 Paroxysmal atrial fibrillation: Secondary | ICD-10-CM | POA: Diagnosis present

## 2024-04-14 DIAGNOSIS — Z6824 Body mass index (BMI) 24.0-24.9, adult: Secondary | ICD-10-CM

## 2024-04-14 DIAGNOSIS — K219 Gastro-esophageal reflux disease without esophagitis: Secondary | ICD-10-CM | POA: Diagnosis present

## 2024-04-14 DIAGNOSIS — L409 Psoriasis, unspecified: Secondary | ICD-10-CM | POA: Diagnosis present

## 2024-04-14 DIAGNOSIS — N321 Vesicointestinal fistula: Secondary | ICD-10-CM | POA: Diagnosis present

## 2024-04-14 DIAGNOSIS — I10 Essential (primary) hypertension: Secondary | ICD-10-CM | POA: Diagnosis present

## 2024-04-14 DIAGNOSIS — M069 Rheumatoid arthritis, unspecified: Secondary | ICD-10-CM | POA: Diagnosis present

## 2024-04-14 DIAGNOSIS — F419 Anxiety disorder, unspecified: Secondary | ICD-10-CM | POA: Diagnosis present

## 2024-04-14 DIAGNOSIS — Z79899 Other long term (current) drug therapy: Secondary | ICD-10-CM

## 2024-04-14 DIAGNOSIS — Y838 Other surgical procedures as the cause of abnormal reaction of the patient, or of later complication, without mention of misadventure at the time of the procedure: Secondary | ICD-10-CM | POA: Diagnosis present

## 2024-04-14 DIAGNOSIS — Z9049 Acquired absence of other specified parts of digestive tract: Secondary | ICD-10-CM

## 2024-04-14 LAB — CBC
HCT: 33.3 % — ABNORMAL LOW (ref 36.0–46.0)
Hemoglobin: 10.5 g/dL — ABNORMAL LOW (ref 12.0–15.0)
MCH: 28.2 pg (ref 26.0–34.0)
MCHC: 31.5 g/dL (ref 30.0–36.0)
MCV: 89.5 fL (ref 80.0–100.0)
Platelets: 739 K/uL — ABNORMAL HIGH (ref 150–400)
RBC: 3.72 MIL/uL — ABNORMAL LOW (ref 3.87–5.11)
RDW: 15.3 % (ref 11.5–15.5)
WBC: 12.2 K/uL — ABNORMAL HIGH (ref 4.0–10.5)
nRBC: 0 % (ref 0.0–0.2)

## 2024-04-14 LAB — COMPREHENSIVE METABOLIC PANEL WITH GFR
ALT: 23 U/L (ref 0–44)
AST: 24 U/L (ref 15–41)
Albumin: 3.2 g/dL — ABNORMAL LOW (ref 3.5–5.0)
Alkaline Phosphatase: 104 U/L (ref 38–126)
Anion gap: 14 (ref 5–15)
BUN: 12 mg/dL (ref 8–23)
CO2: 20 mmol/L — ABNORMAL LOW (ref 22–32)
Calcium: 8.9 mg/dL (ref 8.9–10.3)
Chloride: 98 mmol/L (ref 98–111)
Creatinine, Ser: 0.92 mg/dL (ref 0.44–1.00)
GFR, Estimated: >60 mL/min (ref >60)
Glucose, Bld: 101 mg/dL — ABNORMAL HIGH (ref 70–99)
Potassium: 4.3 mmol/L (ref 3.5–5.1)
Sodium: 132 mmol/L — ABNORMAL LOW (ref 135–145)
Total Bilirubin: 0.7 mg/dL (ref 0.0–1.2)
Total Protein: 6.7 g/dL (ref 6.5–8.1)

## 2024-04-14 LAB — LIPASE, BLOOD: Lipase: 36 U/L (ref 11–51)

## 2024-04-14 NOTE — ED Triage Notes (Signed)
 Patient c/o abdominal pain and stoma leakage started today. Patient report general-surgery oncall recommended to be seen in ED for further evaluation.  Patient report nausea, denies vomiting. Patient denies fever at home. Hx bowel porforation, ex lap done 2 weeks ago.

## 2024-04-15 ENCOUNTER — Emergency Department (HOSPITAL_COMMUNITY)

## 2024-04-15 ENCOUNTER — Inpatient Hospital Stay (HOSPITAL_COMMUNITY)

## 2024-04-15 ENCOUNTER — Encounter (HOSPITAL_COMMUNITY): Payer: Self-pay

## 2024-04-15 DIAGNOSIS — I739 Peripheral vascular disease, unspecified: Secondary | ICD-10-CM | POA: Diagnosis present

## 2024-04-15 DIAGNOSIS — Z7952 Long term (current) use of systemic steroids: Secondary | ICD-10-CM | POA: Diagnosis not present

## 2024-04-15 DIAGNOSIS — Z7901 Long term (current) use of anticoagulants: Secondary | ICD-10-CM | POA: Diagnosis not present

## 2024-04-15 DIAGNOSIS — E871 Hypo-osmolality and hyponatremia: Secondary | ICD-10-CM | POA: Diagnosis present

## 2024-04-15 DIAGNOSIS — E785 Hyperlipidemia, unspecified: Secondary | ICD-10-CM | POA: Diagnosis present

## 2024-04-15 DIAGNOSIS — F419 Anxiety disorder, unspecified: Secondary | ICD-10-CM | POA: Diagnosis present

## 2024-04-15 DIAGNOSIS — Z90710 Acquired absence of both cervix and uterus: Secondary | ICD-10-CM | POA: Diagnosis not present

## 2024-04-15 DIAGNOSIS — M069 Rheumatoid arthritis, unspecified: Secondary | ICD-10-CM | POA: Diagnosis present

## 2024-04-15 DIAGNOSIS — Z6824 Body mass index (BMI) 24.0-24.9, adult: Secondary | ICD-10-CM | POA: Diagnosis not present

## 2024-04-15 DIAGNOSIS — Z885 Allergy status to narcotic agent status: Secondary | ICD-10-CM | POA: Diagnosis not present

## 2024-04-15 DIAGNOSIS — Z9049 Acquired absence of other specified parts of digestive tract: Secondary | ICD-10-CM | POA: Diagnosis not present

## 2024-04-15 DIAGNOSIS — N321 Vesicointestinal fistula: Secondary | ICD-10-CM | POA: Diagnosis present

## 2024-04-15 DIAGNOSIS — E43 Unspecified severe protein-calorie malnutrition: Secondary | ICD-10-CM | POA: Diagnosis present

## 2024-04-15 DIAGNOSIS — L409 Psoriasis, unspecified: Secondary | ICD-10-CM | POA: Diagnosis present

## 2024-04-15 DIAGNOSIS — K9189 Other postprocedural complications and disorders of digestive system: Secondary | ICD-10-CM | POA: Diagnosis present

## 2024-04-15 DIAGNOSIS — Z87891 Personal history of nicotine dependence: Secondary | ICD-10-CM | POA: Diagnosis not present

## 2024-04-15 DIAGNOSIS — Z79899 Other long term (current) drug therapy: Secondary | ICD-10-CM | POA: Diagnosis not present

## 2024-04-15 DIAGNOSIS — Y838 Other surgical procedures as the cause of abnormal reaction of the patient, or of later complication, without mention of misadventure at the time of the procedure: Secondary | ICD-10-CM | POA: Diagnosis present

## 2024-04-15 DIAGNOSIS — I10 Essential (primary) hypertension: Secondary | ICD-10-CM | POA: Diagnosis present

## 2024-04-15 DIAGNOSIS — K219 Gastro-esophageal reflux disease without esophagitis: Secondary | ICD-10-CM | POA: Diagnosis present

## 2024-04-15 DIAGNOSIS — I480 Paroxysmal atrial fibrillation: Secondary | ICD-10-CM | POA: Diagnosis present

## 2024-04-15 LAB — URINALYSIS, ROUTINE W REFLEX MICROSCOPIC
Bilirubin Urine: NEGATIVE
Glucose, UA: NEGATIVE mg/dL
Hgb urine dipstick: NEGATIVE
Ketones, ur: NEGATIVE mg/dL
Leukocytes,Ua: NEGATIVE
Nitrite: NEGATIVE
Protein, ur: NEGATIVE mg/dL
Specific Gravity, Urine: 1.03 (ref 1.005–1.030)
pH: 6 (ref 5.0–8.0)

## 2024-04-15 LAB — CBC
HCT: 33.6 % — ABNORMAL LOW (ref 36.0–46.0)
Hemoglobin: 10.2 g/dL — ABNORMAL LOW (ref 12.0–15.0)
MCH: 28.3 pg (ref 26.0–34.0)
MCHC: 30.4 g/dL (ref 30.0–36.0)
MCV: 93.1 fL (ref 80.0–100.0)
Platelets: 730 K/uL — ABNORMAL HIGH (ref 150–400)
RBC: 3.61 MIL/uL — ABNORMAL LOW (ref 3.87–5.11)
RDW: 15.3 % (ref 11.5–15.5)
WBC: 10.9 K/uL — ABNORMAL HIGH (ref 4.0–10.5)
nRBC: 0 % (ref 0.0–0.2)

## 2024-04-15 LAB — BASIC METABOLIC PANEL WITH GFR
Anion gap: 12 (ref 5–15)
BUN: 11 mg/dL (ref 8–23)
CO2: 21 mmol/L — ABNORMAL LOW (ref 22–32)
Calcium: 8.6 mg/dL — ABNORMAL LOW (ref 8.9–10.3)
Chloride: 99 mmol/L (ref 98–111)
Creatinine, Ser: 0.85 mg/dL (ref 0.44–1.00)
GFR, Estimated: >60 mL/min (ref >60)
Glucose, Bld: 85 mg/dL (ref 70–99)
Potassium: 3.6 mmol/L (ref 3.5–5.1)
Sodium: 132 mmol/L — ABNORMAL LOW (ref 135–145)

## 2024-04-15 LAB — PROTIME-INR
INR: 1.1 (ref 0.8–1.2)
Prothrombin Time: 14.3 s (ref 11.4–15.2)

## 2024-04-15 MED ORDER — POTASSIUM CHLORIDE IN NACL 20-0.9 MEQ/L-% IV SOLN
INTRAVENOUS | Status: DC
  Filled 2024-04-15 (×2): qty 1000

## 2024-04-15 MED ORDER — AMIODARONE HCL 100 MG PO TABS
100.0000 mg | ORAL_TABLET | Freq: Every day | ORAL | Status: DC
  Administered 2024-04-15 – 2024-04-25 (×11): 100 mg via ORAL
  Filled 2024-04-15 (×11): qty 1

## 2024-04-15 MED ORDER — ONDANSETRON HCL 4 MG/2ML IJ SOLN
4.0000 mg | Freq: Four times a day (QID) | INTRAMUSCULAR | Status: DC | PRN
  Administered 2024-04-17 – 2024-04-26 (×8): 4 mg via INTRAVENOUS
  Filled 2024-04-15 (×8): qty 2

## 2024-04-15 MED ORDER — IOHEXOL 9 MG/ML PO SOLN
ORAL | Status: AC
  Filled 2024-04-15: qty 1000

## 2024-04-15 MED ORDER — SODIUM CHLORIDE (PF) 0.9 % IJ SOLN
INTRAMUSCULAR | Status: AC
  Filled 2024-04-15: qty 50

## 2024-04-15 MED ORDER — PIPERACILLIN-TAZOBACTAM 3.375 G IVPB
3.3750 g | Freq: Three times a day (TID) | INTRAVENOUS | Status: DC
  Administered 2024-04-15 – 2024-04-21 (×19): 3.375 g via INTRAVENOUS
  Filled 2024-04-15 (×19): qty 50

## 2024-04-15 MED ORDER — IOHEXOL 300 MG/ML  SOLN
100.0000 mL | Freq: Once | INTRAMUSCULAR | Status: AC | PRN
  Administered 2024-04-15: 100 mL via INTRAVENOUS

## 2024-04-15 MED ORDER — ZINC OXIDE 40 % EX OINT
TOPICAL_OINTMENT | Freq: Two times a day (BID) | CUTANEOUS | Status: DC
  Administered 2024-04-21: 1 via TOPICAL
  Filled 2024-04-15: qty 57

## 2024-04-15 MED ORDER — AMLODIPINE BESYLATE 5 MG PO TABS
5.0000 mg | ORAL_TABLET | Freq: Every day | ORAL | Status: DC
  Administered 2024-04-15 – 2024-04-26 (×9): 5 mg via ORAL
  Filled 2024-04-15 (×11): qty 1

## 2024-04-15 MED ORDER — HYDROMORPHONE HCL 1 MG/ML IJ SOLN
1.0000 mg | INTRAMUSCULAR | Status: DC | PRN
  Administered 2024-04-16 – 2024-04-25 (×12): 1 mg via INTRAVENOUS
  Filled 2024-04-15 (×12): qty 1

## 2024-04-15 MED ORDER — IOHEXOL 9 MG/ML PO SOLN
500.0000 mL | ORAL | Status: AC
  Administered 2024-04-15 (×2): 500 mL via ORAL

## 2024-04-15 MED ORDER — METOPROLOL SUCCINATE ER 50 MG PO TB24
50.0000 mg | ORAL_TABLET | Freq: Two times a day (BID) | ORAL | Status: DC
  Administered 2024-04-15 – 2024-04-26 (×20): 50 mg via ORAL
  Filled 2024-04-15 (×22): qty 1

## 2024-04-15 MED ORDER — ATORVASTATIN CALCIUM 20 MG PO TABS
80.0000 mg | ORAL_TABLET | Freq: Every day | ORAL | Status: DC
  Administered 2024-04-15 – 2024-04-25 (×11): 80 mg via ORAL
  Filled 2024-04-15 (×12): qty 4

## 2024-04-15 MED ORDER — ONDANSETRON 4 MG PO TBDP
4.0000 mg | ORAL_TABLET | Freq: Four times a day (QID) | ORAL | Status: DC | PRN
  Administered 2024-04-16 – 2024-04-24 (×2): 4 mg via ORAL
  Filled 2024-04-15 (×2): qty 1

## 2024-04-15 NOTE — ED Provider Notes (Signed)
 [place] EMERGENCY DEPARTMENT AT [facility] Provider Note   CSN: [id number] Arrival date & time: 04/14/24  2140     Patient presents with: Post-op Problem and Abdominal Pain   Olivia [name] is a 68 y.o. female.   The history is provided by the patient.  Abdominal Pain Pain location:  Generalized Pain quality: aching   Pain radiates to:  Does not radiate Pain severity:  Moderate Onset quality:  Gradual Timing:  Constant Progression:  Worsening Chronicity:  New Context: not alcohol use   Relieved by:  Nothing Worsened by:  Nothing Ineffective treatments:  None tried Associated symptoms: no anorexia and no fever   Patient is POD 17 from diverticulitis surgery with ileostomy and presents with pain and stool drainage in the right abdomen.  Patient reports calling surgeon and was advised to come to the ED.      Past Medical History:  Diagnosis Date   Anemia    Anxiety    Diverticulitis    Dyslipidemia    Dysrhythmia    A.fib   GERD (gastroesophageal reflux disease)    Hypertension    PAD (peripheral artery disease)    PONV (postoperative nausea and vomiting)    Psoriasis    RA (rheumatoid arthritis) (HCC)    Thrombocytosis    Vertigo      Prior to Admission medications   Medication Sig Start Date End Date Taking? Authorizing Provider  acidophilus (RISAQUAD) CAPS capsule Take 1 capsule by mouth daily.    [provider]  amiodarone  (PACERONE ) 100 MG tablet Take 100 mg by mouth at bedtime.    [provider]  amLODipine  (NORVASC ) 5 MG tablet Take 5 mg by mouth in the morning and at bedtime.    [provider]  atorvastatin  (LIPITOR) 80 MG tablet Take 80 mg by mouth at bedtime.    [provider]  calcium -vitamin D (OSCAL WITH D) 500-5 MG-MCG tablet Take 1 tablet by mouth 2 (two) times daily.    [provider]  cetirizine (ZYRTEC) 10 MG tablet Take 10 mg by mouth daily.    [provider]  CINNAMON   PO Take 1 tablet by mouth daily.    [provider]  Cyanocobalamin  (VITAMIN B 12 PO) Place 3,000 mcg under the tongue daily.    [provider]  Cyanocobalamin  (VITAMIN B-12 IJ) Inject 1,000 mcg into the muscle once a week.    [provider]  ergocalciferol (VITAMIN D2) 1.25 MG (50000 UT) capsule Take 50,000 Units by mouth every Wednesday.    [provider]  fluticasone  (FLONASE ) 50 MCG/ACT nasal spray Place 1 spray into both nostrils daily as needed for allergies or rhinitis.    [provider]  gabapentin  (NEURONTIN ) 300 MG capsule Take 300 mg by mouth 3 (three) times daily.    [provider]  glucosamine-chondroitin 500-400 MG tablet Take 1 tablet by mouth daily.    [provider]  Ibuprofen-diphenhydrAMINE  Cit ([employer] IBUPROFEN PM) 200-38 MG TABS Take 2 tablets by mouth at bedtime as needed (sleep).    [provider]  latanoprost  (XALATAN ) 0.005 % ophthalmic solution Place 1 drop into both eyes at bedtime. 12/01/23   [provider]  Magnesium  Citrate (MAGNESIUM  GUMMIES PO) Take 2 tablets by mouth daily.    [provider]  metoprolol  succinate (TOPROL -XL) 50 MG 24 hr tablet Take 50 mg by mouth 2 (two) times daily. 12/12/22   [provider]  predniSONE  (DELTASONE ) 5 MG  tablet Take 5 mg by mouth daily with breakfast.    [provider]  sodium chloride  flush (NS) 0.9 % SOLN 10 mLs by Intracatheter route as needed (flush). Patient not taking: Reported on 03/18/2024 11/18/23   Jonel Lonni SQUIBB, MD  sulfaSALAzine  (AZULFIDINE ) 500 MG tablet Take 1,000 mg by mouth 2 (two) times daily.    [provider]  traMADol  (ULTRAM ) 50 MG tablet Take 1 tablet (50 mg total) by mouth every 6 (six) hours as needed for moderate pain (pain score 4-6) or severe pain (pain score 7-10). 03/31/24   Kinsinger, [name], MD  XARELTO  20 MG TABS tablet Take 20 mg by mouth daily. 12/12/22   [provider]    Allergies: Codeine    Review of Systems  Constitutional:  Negative for fever.  Gastrointestinal:  Positive for abdominal pain. Negative for anorexia.  Skin:  Positive for wound.  All other systems reviewed and are negative.   Updated Vital Signs BP (!) 130/57   Pulse 74   Temp 97.7 F (36.5 C)   Resp 15   SpO2 97%   Physical Exam Vitals and nursing note reviewed.  Constitutional:      General: She is not in acute distress.    Appearance: She is well-developed.  HENT:     Head: Normocephalic and atraumatic.     Nose: Nose normal.  Eyes:     Pupils: Pupils are equal, round, and reactive to light.  Cardiovascular:     Rate and Rhythm: Normal rate and regular rhythm.     Pulses: Normal pulses.     Heart sounds: Normal heart sounds.  Pulmonary:     Effort: Pulmonary effort is normal. No respiratory distress.     Breath sounds: Normal breath sounds.  Abdominal:     General: Bowel sounds are decreased. There is no distension.     Palpations: Abdomen is soft.     Tenderness: There is abdominal tenderness. There is no guarding or rebound.     Comments: Stool leakage right lateral abdomen Vac without erythema   Musculoskeletal:        General: Normal range of motion.     Cervical back: Neck supple.  Skin:    General: Skin is dry.     Capillary Refill: Capillary refill takes less than 2 seconds.     Findings: No erythema or rash.  Neurological:     General: No focal deficit present.     Deep Tendon Reflexes: Reflexes normal.  Psychiatric:        Mood and Affect: Mood normal.     (all labs ordered are listed, but only abnormal results are displayed) Results for orders placed or performed during the hospital encounter of 04/14/24  Lipase, blood   Collection Time: 04/14/24 10:22 PM  Result Value Ref Range   Lipase 36 11 - 51 U/L  Comprehensive metabolic panel   Collection Time: 04/14/24 10:22 PM  Result Value Ref Range   Sodium 132 (L) 135 - 145  mmol/L   Potassium 4.3 3.5 - 5.1 mmol/L   Chloride 98 98 - 111 mmol/L   CO2 20 (L) 22 - 32 mmol/L   Glucose, Bld 101 (H) 70 - 99 mg/dL   BUN 12 8 - 23 mg/dL   Creatinine, Ser 9.07 0.44 - 1.00 mg/dL   Calcium  8.9 8.9 - 10.3 mg/dL   Total Protein 6.7 6.5 - 8.1 g/dL   Albumin  3.2 (L) 3.5 - 5.0 g/dL  AST 24 15 - 41 U/L   ALT 23 0 - 44 U/L   Alkaline Phosphatase 104 38 - 126 U/L   Total Bilirubin 0.7 0.0 - 1.2 mg/dL   GFR, Estimated >39 >39 mL/min   Anion gap 14 5 - 15  CBC   Collection Time: 04/14/24 10:22 PM  Result Value Ref Range   WBC 12.2 (H) 4.0 - 10.5 K/uL   RBC 3.72 (L) 3.87 - 5.11 MIL/uL   Hemoglobin 10.5 (L) 12.0 - 15.0 g/dL   HCT 66.6 (L) 63.9 - 53.9 %   MCV 89.5 80.0 - 100.0 fL   MCH 28.2 26.0 - 34.0 pg   MCHC 31.5 30.0 - 36.0 g/dL   RDW 84.6 88.4 - 84.4 %   Platelets 739 (H) 150 - 400 K/uL   nRBC 0.0 0.0 - 0.2 %   CT ABDOMEN PELVIS W CONTRAST Result Date: 04/15/2024 EXAM: CT ABDOMEN AND PELVIS WITH CONTRAST 04/15/2024 01:31:27 AM TECHNIQUE: CT of the abdomen and pelvis was performed with the administration of intravenous contrast, 100 mL of iohexol  (OMNIPAQUE ) 300 MG/ML solution. Multiplanar reformatted images are provided for review. Automated exposure control, iterative reconstruction, and/or weight-based adjustment of the mA/kV was utilized to reduce the radiation dose to as low as reasonably achievable. COMPARISON: Compared to April 03, 2024. CLINICAL HISTORY: Polytrauma, blunt. Abdominal pain and stoma leakage started today, 2 weeks s/p surgery for bowel perforation, wbc's 12.2, GFR>60. FINDINGS: LOWER CHEST: No acute abnormality. LIVER: The liver is unremarkable, with scattered areas of pneumobilia within the expected bile duct and nondependent intrahepatic biliary tree, suggesting prior sphincterotomy, which appears unchanged from prior examination. GALLBLADDER AND BILE DUCTS: Cholelithiasis without superimposed pericholecystic inflammatory change. Mild intra and  moderate extrahepatic biliary ductal dilation. SPLEEN: No acute abnormality. PANCREAS: No acute abnormality. ADRENAL GLANDS: No acute abnormality. KIDNEYS, URETERS AND BLADDER: Scattered cortical hypodensities are identified within the kidneys bilaterally, which are too small to characterize but likely represent multiple cortical cysts. No follow-up imaging is recommended for these lesions. Punctate nonobstructing calculi are seen within the kidneys bilaterally, measuring 2-3 mm in size. No ureteral calculi. No hydronephrosis. No perinephric inflammatory stranding or fluid collections were identified. The bladder is unremarkable. GI AND BOWEL: Moderate descending and sigmoid colon diverticulosis. Surgical changes of sigmoid and right hemicolectomy are identified. There is a rim-enhancing complex fluid collection dictating non-dependent gas seen within the anterior peritoneum adjacent to the ileocolic anastomosis, measuring 1.4 x 5.4 x 8.2 cm. The fluid appears to track superiorly and may communicate with the midline incision and right mid abdominal ileostomy takedown site. Surgical changes of ventral hernia repair with mesh have been performed, with this complex fluid collection urinary subjacent to and likely extending through the mesh. The terminal small bowel and colon appear hyperemic, fluid-filled, and mildly dilated, with a small bowel measuring up to 3.9 cm in diameter, suggesting inflammation and an underlying developing ileus. There is mesenteric edema and peritoneal enhancement, which may be postsurgical or inflammatory in nature, as can be seen with developing peritonitis. No evidence of obstruction. PERITONEUM AND RETROPERITONEUM: There is a complex fluid collection with non-dependent gas seen within the anterior peritoneum adjacent to the ileocolic anastomosis, which may communicate with the midline incision and right mid abdominal ileostomy takedown site. VASCULATURE: Right coronary artery  calcification. Extent to the other iliac atherosclerotic calcifications. LYMPH NODES: No lymphadenopathy. REPRODUCTIVE ORGANS: Status post hysterectomy. No adnexal masses were seen. BONES AND SOFT TISSUES: Osseous structures are age-appropriate. No acute bone abnormality.  No lytic or blastic bone lesion. Mild cardiomegaly. IMPRESSION: 1. Rim-enhancing complex fluid collection with non-dependent gas in the anterior peritoneum adjacent to the ileocolic anastomosis, measuring 1.4 x 5.4 x 8.2 cm, possibly communicating with the midline incision and right mid abdominal ileostomy takedown site. The collection is subjacent to and likely extends through the mesh from prior ventral hernia repair. 2. Hyperemic, fluid-filled, and mildly dilated terminal small bowel and colon, with small bowel measuring up to 3.9 cm in diameter, suggesting inflammation and an underlying developing ileus. No evidence of obstruction. 3. Mesenteric edema and peritoneal enhancement, possibly postsurgical or inflammatory, as can be seen with developing peritonitis. 4. Mild intra- and moderate extrahepatic biliary ductal dilation with pneumobilia, likely related to prior sphincterotomy, unchanged from prior examination. Electronically signed by: Dorethia Molt MD 04/15/2024 01:46 AM EDT RP Workstation: [id number]   US  EKG SITE RITE Result Date: 04/08/2024 If Site Rite image not attached, placement could not be confirmed due to current cardiac rhythm.  DG Abd Portable 1V Result Date: 04/08/2024 CLINICAL DATA:  Ileus. Laparotomy with ileostomy closure 03/29/2024. EXAM: PORTABLE ABDOMEN - 1 VIEW COMPARISON:  01/05/2020 and CT 03/26/2024 FINDINGS: Catheter from below with tip over the left mid abdomen. Surgical suture line over the right lower quadrant. There are several air-filled mildly dilated small bowel loops in the central abdomen measuring up to 4.6 cm in diameter. Slightly increased number of these dilated bowel loops compared to the recent  CT. No free peritoneal air. Contrast within the colon. Remainder of the exam is unchanged. IMPRESSION: Several air-filled mildly dilated small bowel loops in the central abdomen measuring up to 4.6 cm in diameter. Slightly increased number of these dilated bowel loops compared to the recent CT. Findings may be due to persistent postoperative ileus versus small bowel obstruction. Electronically Signed   By: Toribio Agreste M.D.   On: 04/08/2024 10:25   CT ABDOMEN PELVIS WO CONTRAST Result Date: 04/03/2024 CLINICAL DATA:  68 year old female with abdominal pain. History of colostomy reversal with fluid collection and architectural distortion in the peritoneal cavity at the exiting ostomy loop. Follow-up exam with oral contrast. EXAM: CT ABDOMEN AND PELVIS WITHOUT CONTRAST TECHNIQUE: Multidetector CT imaging of the abdomen and pelvis was performed following the standard protocol without IV contrast. RADIATION DOSE REDUCTION: This exam was performed according to the departmental dose-optimization program which includes automated exposure control, adjustment of the mA and/or kV according to patient size and/or use of iterative reconstruction technique. COMPARISON:  CT Abdomen and Pelvis with IV contrast 0159 hours today. FINDINGS: Lower chest: Trace pleural fluid and patchy, irregular lung base opacity unchanged from earlier. No significant pericardial effusion. Hepatobiliary: Stable pneumobilia. Perihepatic free fluid along the right lobe of the liver appears more hyperdense now, up to 42 Hounsfield units which is an increase of up to 30 Hounsfield units from earlier today. Liver parenchyma appears stable. Gallbladder surgically absent. Pancreas: Stable, negative. Spleen: Stable and diminutive. Adrenals/Urinary Tract: Stable, negative adrenal glands. Nonobstructed kidneys. Diminutive ureters. Oral contrast now within the urinary bladder. Pelvic phleboliths. Stomach/Bowel: Increasingly fluid distended, dilated small  bowel loops throughout the abdomen. Oral contrast was administered. The leading edge of oral contrast is in the left abdominal loop seen on coronal image 56. Upstream of that loop dilated fluid containing nonenhancing small bowel loops converge on the ostomy site where there seems to gradual transition to nondilated loops approaching the ostomy. And mild regional architectural distortion better demonstrated on the earlier exam. Stomach and duodenum also demonstrate  contrast in the lumen. Multiple duodenal diverticula are identified including that on series 2, image 24. No pneumoperitoneum is identified. Postoperative changes the ventral abdominal wall with mesh. Trace subcutaneous gas near the ostomy loop. The ostomy itself appears completely decompressed. Decompressed large bowel from the distal transverse colon to the rectum with descending diverticulosis. Transverse colon mild redundancy, retained gas and stool. Similar retained gas and stool in the right colon. Unchanged appearance of retrocecal appendix on series 2, image 54. Vascular/Lymphatic: Extensive Aortoiliac calcified atherosclerosis. Normal caliber abdominal aorta. No lymphadenopathy identified. Reproductive: Surgically absent. Other: Pelvic inlet free fluid (series 2, image 71) appears slightly increased in density from earlier (about 10 Hounsfield units greater now up to 36 Hounsfield units). Musculoskeletal: Stable.  No acute osseous abnormality identified. IMPRESSION: 1. Oral contrast administered with appearance now of Small-bowel Obstruction; oral contrast not reaching the ostomy site and with a gradual transition from dilated to more decompressed loops in the region of ostomy and mesenteric postoperative changes subjacent to the abdominal wall. 2. Small volume of free fluid in the abdomen and pelvis appears increased in density vs CT at 0159 hours today, raising the possibility of bowel leak - although NO Pneumoperitoneum to confirm a leak or  perforation. Small volume hemoperitoneum is the main differential consideration. 3. Otherwise stable CT appearance of the abdomen, pelvis, lung bases from earlier today. Aortic Atherosclerosis (ICD10-I70.0). Electronically Signed   By: VEAR Hurst M.D.   On: 04/03/2024 06:12   CT ABDOMEN PELVIS W CONTRAST Result Date: 04/03/2024 CLINICAL DATA:  Abdominal pain, postop resected colostomy reversal EXAM: CT ABDOMEN AND PELVIS WITH CONTRAST TECHNIQUE: Multidetector CT imaging of the abdomen and pelvis was performed using the standard protocol following bolus administration of intravenous contrast. RADIATION DOSE REDUCTION: This exam was performed according to the departmental dose-optimization program which includes automated exposure control, adjustment of the mA and/or kV according to patient size and/or use of iterative reconstruction technique. CONTRAST:  OMNIPAQUE  IOHEXOL  300 MG/ML  SOLN COMPARISON:  01/22/2024 FINDINGS: Lower chest: Bibasilar atelectasis.  No effusions. Hepatobiliary: Pneumobilia again noted. No focal hepatic abnormality. Pancreas: No focal abnormality or ductal dilatation. Spleen: No focal abnormality.  Normal size. Adrenals/Urinary Tract: Scattered bilateral renal cysts which appear benign. No follow-up imaging recommended. Punctate nonobstructing stone in the midpole of the left kidney. No hydronephrosis. Adrenal glands and urinary bladder unremarkable. Stomach/Bowel: Stomach is unremarkable. 2.7 cm diverticulum noted laterally off the 1st portion of the duodenum, stable since prior study. Postoperative changes are noted in the right abdomen from cost may reversal. There are dilated fluid-filled small bowel loops in the lower abdomen and into the pelvis. Cannot exclude small bowel obstruction. Large bowel grossly unremarkable. Vascular/Lymphatic: Aortic atherosclerosis. No evidence of aneurysm or adenopathy. Reproductive: Prior hysterectomy.  No adnexal masses. Other: Free fluid in the  pelvis and adjacent to the liver. Small irregular fluid collection noted in the right abdomen anteriorly at the postoperative site. This measures approximately 3.1 x 2.6 cm on image 45. Musculoskeletal: No acute bony abnormality. IMPRESSION: Postoperative changes from colostomy reversal. Small fluid collection noted in the anterior abdomen at the prior ostomy level measuring 3.1 x 2.6 cm. This could reflect a small postoperative fluid collection. Early abscess cannot be excluded. Dilated small bowel loops into the pelvis. Terminal ileum is decompressed. Cannot exclude distal small bowel obstruction. Punctate left nephrolithiasis.  No hydronephrosis. Small amount of free fluid in the abdomen and pelvis. Electronically Signed   By: Franky Crease M.D.  On: 04/03/2024 02:24     Radiology: CT ABDOMEN PELVIS W CONTRAST Result Date: 04/15/2024 EXAM: CT ABDOMEN AND PELVIS WITH CONTRAST 04/15/2024 01:31:27 AM TECHNIQUE: CT of the abdomen and pelvis was performed with the administration of intravenous contrast, 100 mL of iohexol  (OMNIPAQUE ) 300 MG/ML solution. Multiplanar reformatted images are provided for review. Automated exposure control, iterative reconstruction, and/or weight-based adjustment of the mA/kV was utilized to reduce the radiation dose to as low as reasonably achievable. COMPARISON: Compared to April 03, 2024. CLINICAL HISTORY: Polytrauma, blunt. Abdominal pain and stoma leakage started today, 2 weeks s/p surgery for bowel perforation, wbc's 12.2, GFR>60. FINDINGS: LOWER CHEST: No acute abnormality. LIVER: The liver is unremarkable, with scattered areas of pneumobilia within the expected bile duct and nondependent intrahepatic biliary tree, suggesting prior sphincterotomy, which appears unchanged from prior examination. GALLBLADDER AND BILE DUCTS: Cholelithiasis without superimposed pericholecystic inflammatory change. Mild intra and moderate extrahepatic biliary ductal dilation. SPLEEN: No acute  abnormality. PANCREAS: No acute abnormality. ADRENAL GLANDS: No acute abnormality. KIDNEYS, URETERS AND BLADDER: Scattered cortical hypodensities are identified within the kidneys bilaterally, which are too small to characterize but likely represent multiple cortical cysts. No follow-up imaging is recommended for these lesions. Punctate nonobstructing calculi are seen within the kidneys bilaterally, measuring 2-3 mm in size. No ureteral calculi. No hydronephrosis. No perinephric inflammatory stranding or fluid collections were identified. The bladder is unremarkable. GI AND BOWEL: Moderate descending and sigmoid colon diverticulosis. Surgical changes of sigmoid and right hemicolectomy are identified. There is a rim-enhancing complex fluid collection dictating non-dependent gas seen within the anterior peritoneum adjacent to the ileocolic anastomosis, measuring 1.4 x 5.4 x 8.2 cm. The fluid appears to track superiorly and may communicate with the midline incision and right mid abdominal ileostomy takedown site. Surgical changes of ventral hernia repair with mesh have been performed, with this complex fluid collection urinary subjacent to and likely extending through the mesh. The terminal small bowel and colon appear hyperemic, fluid-filled, and mildly dilated, with a small bowel measuring up to 3.9 cm in diameter, suggesting inflammation and an underlying developing ileus. There is mesenteric edema and peritoneal enhancement, which may be postsurgical or inflammatory in nature, as can be seen with developing peritonitis. No evidence of obstruction. PERITONEUM AND RETROPERITONEUM: There is a complex fluid collection with non-dependent gas seen within the anterior peritoneum adjacent to the ileocolic anastomosis, which may communicate with the midline incision and right mid abdominal ileostomy takedown site. VASCULATURE: Right coronary artery calcification. Extent to the other iliac atherosclerotic calcifications.  LYMPH NODES: No lymphadenopathy. REPRODUCTIVE ORGANS: Status post hysterectomy. No adnexal masses were seen. BONES AND SOFT TISSUES: Osseous structures are age-appropriate. No acute bone abnormality. No lytic or blastic bone lesion. Mild cardiomegaly. IMPRESSION: 1. Rim-enhancing complex fluid collection with non-dependent gas in the anterior peritoneum adjacent to the ileocolic anastomosis, measuring 1.4 x 5.4 x 8.2 cm, possibly communicating with the midline incision and right mid abdominal ileostomy takedown site. The collection is subjacent to and likely extends through the mesh from prior ventral hernia repair. 2. Hyperemic, fluid-filled, and mildly dilated terminal small bowel and colon, with small bowel measuring up to 3.9 cm in diameter, suggesting inflammation and an underlying developing ileus. No evidence of obstruction. 3. Mesenteric edema and peritoneal enhancement, possibly postsurgical or inflammatory, as can be seen with developing peritonitis. 4. Mild intra- and moderate extrahepatic biliary ductal dilation with pneumobilia, likely related to prior sphincterotomy, unchanged from prior examination. Electronically signed by: Dorethia Molt MD 04/15/2024 01:46 AM  EDT RP Workstation: [id number]     Procedures   Medications Ordered in the ED  iohexol  (OMNIPAQUE ) 300 MG/ML solution 100 mL (100 mLs Intravenous Contrast Given 04/15/24 0119)                                    Medical Decision Making Worsening pain and stool drainage lateral to vac site   Amount and/or Complexity of Data Reviewed Independent Historian: spouse    Details: See above  External Data Reviewed: labs, radiology and notes.    Details: Previous admissions reviewed  Labs: ordered.    Details: White count elevated 12.2, low hemoglobin 10.7, elevated platelets 739.  Normal potassium 4.3 slight low sodium 132, normal creatinine 0.92, normal LFTS, blood cultures ordered .   Radiology: ordered and independent  interpretation performed.    Details: No SBO by me  Discussion of management or test interpretation with external provider(s): Case d/w Dr. [place] of surgery. No antibiotics at this time.  Will admit.    Risk Prescription drug management. Decision regarding hospitalization.    Final diagnoses:  Postoperative infection, unspecified type, initial encounter   The patient appears reasonably stabilized for admission considering the current resources, flow, and capabilities available in the ED at this time, and I doubt any other [facility] requiring further screening and/or treatment in the ED prior to admission.  ED Discharge Orders     None          Mackson Botz, MD 04/15/24 [id number]

## 2024-04-15 NOTE — Plan of Care (Signed)
   Problem: Health Behavior/Discharge Planning: Goal: Ability to manage health-related needs will improve Outcome: Progressing

## 2024-04-15 NOTE — H&P (Signed)
 H&P Note  Olivia [name] December 07, 1955  [id number].    Requesting MD: April Palumbo, MD Chief Complaint/Reason for Consult: Stool like drainage from prior ileostomy site HPI:  Patient is a 69 year old female who presented to the ED yesterday with abdominal pain and feculent drainage from prior ileostomy site. Denies fever, chills, nausea, vomiting but not having stool per rectum. Called the office yesterday and was referred to the ED. Complex recent surgical history of diverticulitis s/p robotic assisted sigmoid colectomy, LOA, drainage of intra-abdominal abscess, takedown of colovesical fistula, repair of bladder and creation of loop ileostomy on 01/12/24 for diverticulitis with colovesical fistula. She underwent takedown of loop ileostomy on 9/23 after recovering from initial surgery but developed early post-operative leak at this site. She was taken back to the OR 9/29 for exploratory laparotomy and ileocecectomy, she required additional resection of small bowel for this secondary to significant scar tissue in small bowel. Post-operative course complicated by ileus and she required TPN. She was discharged home on 10/7. She was having some bowel function at time of discharge but has not had any further since. She denies significant pain at this time. She is hemodynamically stable. PMH significant otherwise for HLD,HTN, Paroxysmal A.Fib, PAD, RA, psoriasis. Other prior abdominal surgeries include cholecystectomy, abdominal hysterectomy with bilateral salpingo-oophorectomy, ileostomy for bowel obstruction previously, ventral hernia repair with mesh. She takes xarelto  daily. She does appear to be on steroids daily at home. Intolerant of codeine with reaction of headache.   ROS: Negative other than HPI  Family History  Problem Relation Age of Onset   Lung cancer Mother    Lung cancer Father    Stomach cancer Brother    Breast cancer Cousin    Colon cancer Neg Hx     Past Medical History:   Diagnosis Date   Anemia    Anxiety    Diverticulitis    Dyslipidemia    Dysrhythmia    A.fib   GERD (gastroesophageal reflux disease)    Hypertension    PAD (peripheral artery disease)    PONV (postoperative nausea and vomiting)    Psoriasis    RA (rheumatoid arthritis) (HCC)    Thrombocytosis    Vertigo     Past Surgical History:  Procedure Laterality Date   ANEURYSM COILING Right    right lower leg   BILIARY BRUSHING  05/16/2020   Procedure: BILIARY BRUSHING;  Surgeon: Golda Claudis PENNER, MD;  Location: [facility];  Service: Endoscopy;;   BILIARY BRUSHING  07/26/2020   Procedure: BILIARY BRUSHING;  Surgeon: Teressa Toribio SQUIBB, MD;  [facility];  Service: Endoscopy;;   BILIARY STENT PLACEMENT  01/05/2020   Procedure: BILIARY STENT PLACEMENT;  Surgeon: Golda Claudis PENNER, MD;  [facility];  Service: Endoscopy;;   BILIARY STENT PLACEMENT  05/16/2020   Procedure: BILIARY STENT PLACEMENT;  Surgeon: Golda Claudis PENNER, MD;  Location: [facility];  Service: Endoscopy;;   CHOLECYSTECTOMY  2005   done during bowel surgery   COLECTOMY, SIGMOID, ROBOT-ASSISTED N/A 01/12/2024   Procedure: COLECTOMY, SIGMOID, ROBOT-ASSISTED;  Surgeon: Stevie [name], MD;  [facility]: [facility];  Service: General;  Laterality: N/A;  ROBOTIC SIGMOID RESECTION WITH ANASTOMOSIS   COLONOSCOPY  03/07/2015   Propofol ; Surgeon: Dr. Petel; Three 6 mm rectosigmoid polyps, moderate left-sided diverticulosis, tortuous rectosigmoid colon. Pathology with inflamed hyperplastic polyp.   COLONOSCOPY WITH PROPOFOL  N/A 08/18/2019   non-bleeding internal hemorrhoids, pancolonic diverticulosis, two 4-7 mm polyps in rectum benign, no  adenomas. Next colonoscopy in 10 years.    CYSTOSCOPY WITH INDOCYANINE GREEN  IMAGING (ICG) N/A 01/12/2024   Procedure: CYSTOSCOPY WITH INDOCYANINE GREEN  IMAGING (ICG);  Surgeon: Elisabeth Valli BIRCH, MD;  [facility];  Service: Urology;  Laterality: N/A;  CYSTO WITH FIREFLY   ENDOSCOPIC  RETROGRADE CHOLANGIOPANCREATOGRAPHY (ERCP) WITH PROPOFOL  N/A 07/26/2020   Procedure: ENDOSCOPIC RETROGRADE CHOLANGIOPANCREATOGRAPHY (ERCP) WITH PROPOFOL ;  Surgeon: Teressa Toribio SQUIBB, MD;  [facility];  Service: Endoscopy;  Laterality: N/A;   ERCP N/A 01/05/2020   Procedure: ENDOSCOPIC RETROGRADE CHOLANGIOPANCREATOGRAPHY (ERCP);  Surgeon: Golda Claudis PENNER, MD;  [facility];  Service: Endoscopy;  Laterality: N/A;   ERCP N/A 05/16/2020   Procedure: ENDOSCOPIC RETROGRADE CHOLANGIOPANCREATOGRAPHY (ERCP);  Surgeon: Golda Claudis PENNER, MD;  [facility];  Service: Endoscopy;  Laterality: N/A;   ESOPHAGOGASTRODUODENOSCOPY (EGD) WITH PROPOFOL  N/A 07/26/2020   Procedure: ESOPHAGOGASTRODUODENOSCOPY (EGD) WITH PROPOFOL ;  Surgeon: Teressa Toribio SQUIBB, MD;  [facility];  Service: Endoscopy;  Laterality: N/A;   GASTROINTESTINAL STENT REMOVAL N/A 05/16/2020   Procedure: STENT REMOVAL;  Surgeon: Golda Claudis PENNER, MD;  [facility]: [facility];  Service: Endoscopy;  Laterality: N/A;   HYSTERECTOMY ABDOMINAL WITH SALPINGO-OOPHORECTOMY     ILEO LOOP DIVERSION Right 01/12/2024   Procedure: CREATION, ILEOSTOMY, LOOP;  Surgeon: Kinsinger, [name], MD;  [facility];  Service: General;  Laterality: Right;  CREATION LOOP ILEOSTOMY   ILEOSTOMY  2005   per patient for bowel obstruction and peritonitis with anastomosis   ILEOSTOMY CLOSURE N/A 03/29/2024   Procedure: CLOSURE, ILEOSTOMY;  Surgeon: Stevie [name], MD;  [facility];  Service: General;  Laterality: N/A;  ILEOSTOMY REVERSAL   INCISION AND DRAINAGE ABSCESS N/A 01/12/2024   Procedure: INCISION AND DRAINAGE, INTRA-ABDOMINAL ABSCESS;  Surgeon: Kinsinger, [name], MD;  [facility];  Service: General;  Laterality: N/A;  DRAINAGE INTRA-ABDOMINAL ABCESS   INSERTION OF MESH     RLQ   IR RADIOLOGIST EVAL & MGMT  11/25/2023   LAPAROTOMY N/A 04/04/2024   Procedure: EXPLORATORY LAPAROTOMY, LYSIS OF ADHESIONS; ILEOCECECTOMY WITH  ANASTAMOSIS;  Surgeon: Kinsinger, [name], MD;  [facility];  Service: General;  Laterality: N/A;   LIGATION OF INTERNAL FISTULA TRACT N/A 01/12/2024   Procedure: LIGATION, INTERNAL FISTULA TRACT;  Surgeon: Stevie, [name], MD;  [facility];  Service: General;  Laterality: N/A;  TAKEDOWN OF COLOVESICAL FISTULA   POLYPECTOMY  08/18/2019   Procedure: POLYPECTOMY;  Surgeon: Shaaron Lamar HERO, MD;  [facility];  Service: Endoscopy;;   REMOVAL OF STONES N/A 01/05/2020   Procedure: REMOVAL OF STONES;  Surgeon: Golda Claudis PENNER, MD;  [facility];  Service: Endoscopy;  Laterality: N/A;   ROBOTIC ASSISTED LAPAROSCOPIC LYSIS OF ADHESION N/A 01/12/2024   Procedure: LYSIS, ADHESIONS, ROBOT-ASSISTED, LAPAROSCOPIC;  Surgeon: Kinsinger, [name], MD;  [facility];  Service: General;  Laterality: N/A;  LYSIS OF ADHESIONS, ABDOMEN   SPHINCTEROTOMY N/A 01/05/2020   Procedure: SPHINCTEROTOMY;  Surgeon: Golda Claudis PENNER, MD;  [facility];  Service: Endoscopy;  Laterality: N/A;   SPHINCTEROTOMY N/A 05/16/2020   Procedure: SPHINCTEROTOMY;  Surgeon: Golda Claudis PENNER, MD;  [facility];  Service: Endoscopy;  Laterality: N/A;   SPYGLASS CHOLANGIOSCOPY N/A 07/26/2020   Procedure: DEBHOJDD CHOLANGIOSCOPY;  Surgeon: Teressa Toribio SQUIBB, MD;  [facility];  Service: Endoscopy;  Laterality: N/A;   STENT REMOVAL  07/26/2020   Procedure: STENT REMOVAL;  Surgeon: Teressa Toribio SQUIBB, MD;  [facility];  Service: Endoscopy;;   UPPER ESOPHAGEAL ENDOSCOPIC ULTRASOUND (EUS) N/A 07/26/2020   Procedure: UPPER ESOPHAGEAL ENDOSCOPIC ULTRASOUND (EUS);  Surgeon: Teressa Toribio SQUIBB, MD;  Location: [facility];  Service: Endoscopy;  Laterality: N/A;    Social History:  reports that she quit smoking about 5 years ago. Her smoking use included cigarettes. She started smoking about 55 years ago. She has a 25 pack-year smoking history. She has never used smokeless  tobacco. She reports that she does not currently use alcohol. She reports that she does not use drugs.  Allergies:  Allergies  Allergen Reactions   Codeine     Headache-draws line across forehead    Medications Prior to Admission  Medication Sig Dispense Refill   amiodarone  (PACERONE ) 100 MG tablet Take 100 mg by mouth at bedtime.     amLODipine  (NORVASC ) 5 MG tablet Take 5 mg by mouth in the morning and at bedtime.     atorvastatin  (LIPITOR) 80 MG tablet Take 80 mg by mouth at bedtime.     Calcium  Carb-Cholecalciferol  (CALCIUM  CARBONATE-VITAMIN D3 PO) Take 600 mg by mouth in the morning and at bedtime.     gabapentin  (NEURONTIN ) 300 MG capsule Take 300 mg by mouth 3 (three) times daily.     latanoprost  (XALATAN ) 0.005 % ophthalmic solution Place 1 drop into both eyes at bedtime.     metoprolol  succinate (TOPROL -XL) 50 MG 24 hr tablet Take 50 mg by mouth 2 (two) times daily.     predniSONE  (DELTASONE ) 5 MG tablet Take 5 mg by mouth daily with breakfast.     rivaroxaban  (XARELTO ) 20 MG TABS tablet Take 20 mg by mouth daily with supper.     sulfaSALAzine  (AZULFIDINE ) 500 MG tablet Take 500 mg by mouth 4 (four) times daily.     traMADol  (ULTRAM ) 50 MG tablet Take 50 mg by mouth every 6 (six) hours as needed for moderate pain (pain score 4-6).      Blood pressure (!) 123/55, pulse 71, temperature 98.2 F (36.8 C), temperature source Oral, resp. rate 18, height 5' 3 (1.6 m), weight 58.5 kg, SpO2 99%. Physical Exam:  General: pleasant, WD, WN female who is laying in bed in NAD HEENT: head is normocephalic, atraumatic.  Sclera are noninjected.  Pupils equal and round.  Ears and nose without any masses or lesions.  Mouth is pink and dry Heart: regular, rate, and rhythm. Palpable radial and pedal pulses bilaterally Lungs: No wheezes, rhonchi, or rales noted.  Respiratory effort nonlabored Abd: soft, NT, ND, midline wound with some fibrinous exudate but fascia appears intact, prior ileostomy  site with some feculent appearing drainage, surrounding skin is excoriated and erythematous  MS: all 4 extremities are symmetrical with no cyanosis, clubbing, or edema. Skin: warm and dry with no masses, lesions, or rashes Neuro: Cranial nerves 2-12 grossly intact, sensation is normal throughout Psych: A&Ox3 with an appropriate affect.   Results for orders placed or performed during the hospital encounter of 04/14/24 (from the past 48 hours)  Lipase, blood     Status: None   Collection Time: 04/14/24 10:22 PM  Result Value Ref Range   Lipase 36 11 - 51 U/L    Comment: Performed at [facility], [address] [place], [place] [address]  Comprehensive metabolic panel     Status: Abnormal   Collection Time: 04/14/24 10:22 PM  Result Value Ref Range   Sodium 132 (L) 135 - 145 mmol/L   Potassium 4.3 3.5 - 5.1 mmol/L  Chloride 98 98 - 111 mmol/L   CO2 20 (L) 22 - 32 mmol/L   Glucose, Bld 101 (H) 70 - 99 mg/dL    Comment: Glucose reference range applies only to samples taken after fasting for at least 8 hours.   BUN 12 8 - 23 mg/dL   Creatinine, Ser 9.07 0.44 - 1.00 mg/dL   Calcium  8.9 8.9 - 10.3 mg/dL   Total Protein 6.7 6.5 - 8.1 g/dL   Albumin  3.2 (L) 3.5 - 5.0 g/dL   AST 24 15 - 41 U/L    Comment: HEMOLYSIS AT THIS LEVEL MAY AFFECT RESULT   ALT 23 0 - 44 U/L   Alkaline Phosphatase 104 38 - 126 U/L   Total Bilirubin 0.7 0.0 - 1.2 mg/dL   GFR, Estimated >39 >39 mL/min    Comment: (NOTE) Calculated using the CKD-EPI Creatinine Equation (2021)    Anion gap 14 5 - 15    Comment: Performed at [facility], [address] [place], [place] [address]  CBC     Status: Abnormal   Collection Time: 04/14/24 10:22 PM  Result Value Ref Range   WBC 12.2 (H) 4.0 - 10.5 K/uL   RBC 3.72 (L) 3.87 - 5.11 MIL/uL   Hemoglobin 10.5 (L) 12.0 - 15.0 g/dL   HCT 66.6 (L) 63.9 - 53.9 %   MCV 89.5 80.0 - 100.0 fL   MCH 28.2 26.0 - 34.0 pg   MCHC 31.5 30.0 -  36.0 g/dL   RDW 84.6 88.4 - 84.4 %   Platelets 739 (H) 150 - 400 K/uL   nRBC 0.0 0.0 - 0.2 %    Comment: Performed at [facility], [address], [place], [place] [address]  Blood culture (routine x 2)     Status: None (Preliminary result)   Collection Time: 04/15/24  2:34 AM   Specimen: BLOOD LEFT ARM  Result Value Ref Range   Specimen Description      BLOOD LEFT ARM Performed at [facility], [address] [place], [place] [address]    Special Requests      Blood Culture adequate volume BOTTLES DRAWN AEROBIC AND ANAEROBIC Performed at [facility], [address], [place], [place] [address]    Culture PENDING    Report Status PENDING   Urinalysis, Routine w reflex microscopic -Urine, Clean Catch     Status: None   Collection Time: 04/15/24  3:00 AM  Result Value Ref Range   Color, Urine YELLOW YELLOW   APPearance CLEAR CLEAR   Specific Gravity, Urine 1.030 1.005 - 1.030   pH 6.0 5.0 - 8.0   Glucose, UA NEGATIVE NEGATIVE mg/dL   Hgb urine dipstick NEGATIVE NEGATIVE   Bilirubin Urine NEGATIVE NEGATIVE   Ketones, ur NEGATIVE NEGATIVE mg/dL   Protein, ur NEGATIVE NEGATIVE mg/dL   Nitrite NEGATIVE NEGATIVE   Leukocytes,Ua NEGATIVE NEGATIVE    Comment: Performed at [facility], [address] [place], [place] [address]  CBC     Status: Abnormal   Collection Time: 04/15/24  4:53 AM  Result Value Ref Range   WBC 10.9 (H) 4.0 - 10.5 K/uL   RBC 3.61 (L) 3.87 - 5.11 MIL/uL   Hemoglobin 10.2 (L) 12.0 - 15.0 g/dL   HCT 66.3 (L) 63.9 - 53.9 %   MCV 93.1 80.0 - 100.0 fL   MCH 28.3 26.0 - 34.0 pg   MCHC 30.4 30.0 - 36.0 g/dL  RDW 15.3 11.5 - 15.5 %   Platelets 730 (H) 150 - 400 K/uL   nRBC 0.0 0.0 - 0.2 %    Comment: Performed at [facility], [address] [place], [place] [address]  Basic metabolic panel     Status: Abnormal   Collection Time: 04/15/24  4:53 AM  Result Value Ref Range    Sodium 132 (L) 135 - 145 mmol/L   Potassium 3.6 3.5 - 5.1 mmol/L   Chloride 99 98 - 111 mmol/L   CO2 21 (L) 22 - 32 mmol/L   Glucose, Bld 85 70 - 99 mg/dL    Comment: Glucose reference range applies only to samples taken after fasting for at least 8 hours.   BUN 11 8 - 23 mg/dL   Creatinine, Ser 9.14 0.44 - 1.00 mg/dL   Calcium  8.6 (L) 8.9 - 10.3 mg/dL   GFR, Estimated >39 >39 mL/min    Comment: (NOTE) Calculated using the CKD-EPI Creatinine Equation (2021)    Anion gap 12 5 - 15    Comment: Performed at [facility], [address], [place], [place] [address]   CT ABDOMEN PELVIS W CONTRAST Result Date: 04/15/2024 EXAM: CT ABDOMEN AND PELVIS WITH CONTRAST 04/15/2024 01:31:27 AM TECHNIQUE: CT of the abdomen and pelvis was performed with the administration of intravenous contrast, 100 mL of iohexol  (OMNIPAQUE ) 300 MG/ML solution. Multiplanar reformatted images are provided for review. Automated exposure control, iterative reconstruction, and/or weight-based adjustment of the mA/kV was utilized to reduce the radiation dose to as low as reasonably achievable. COMPARISON: Compared to April 03, 2024. CLINICAL HISTORY: Polytrauma, blunt. Abdominal pain and stoma leakage started today, 2 weeks s/p surgery for bowel perforation, wbc's 12.2, GFR>60. FINDINGS: LOWER CHEST: No acute abnormality. LIVER: The liver is unremarkable, with scattered areas of pneumobilia within the expected bile duct and nondependent intrahepatic biliary tree, suggesting prior sphincterotomy, which appears unchanged from prior examination. GALLBLADDER AND BILE DUCTS: Cholelithiasis without superimposed pericholecystic inflammatory change. Mild intra and moderate extrahepatic biliary ductal dilation. SPLEEN: No acute abnormality. PANCREAS: No acute abnormality. ADRENAL GLANDS: No acute abnormality. KIDNEYS, URETERS AND BLADDER: Scattered cortical hypodensities are identified within the kidneys bilaterally, which  are too small to characterize but likely represent multiple cortical cysts. No follow-up imaging is recommended for these lesions. Punctate nonobstructing calculi are seen within the kidneys bilaterally, measuring 2-3 mm in size. No ureteral calculi. No hydronephrosis. No perinephric inflammatory stranding or fluid collections were identified. The bladder is unremarkable. GI AND BOWEL: Moderate descending and sigmoid colon diverticulosis. Surgical changes of sigmoid and right hemicolectomy are identified. There is a rim-enhancing complex fluid collection dictating non-dependent gas seen within the anterior peritoneum adjacent to the ileocolic anastomosis, measuring 1.4 x 5.4 x 8.2 cm. The fluid appears to track superiorly and may communicate with the midline incision and right mid abdominal ileostomy takedown site. Surgical changes of ventral hernia repair with mesh have been performed, with this complex fluid collection urinary subjacent to and likely extending through the mesh. The terminal small bowel and colon appear hyperemic, fluid-filled, and mildly dilated, with a small bowel measuring up to 3.9 cm in diameter, suggesting inflammation and an underlying developing ileus. There is mesenteric edema and peritoneal enhancement, which may be postsurgical or inflammatory in nature, as can be seen with developing peritonitis. No evidence of obstruction. PERITONEUM AND RETROPERITONEUM: There is a complex fluid collection with non-dependent gas seen within the anterior peritoneum adjacent to the ileocolic anastomosis, which may  communicate with the midline incision and right mid abdominal ileostomy takedown site. VASCULATURE: Right coronary artery calcification. Extent to the other iliac atherosclerotic calcifications. LYMPH NODES: No lymphadenopathy. REPRODUCTIVE ORGANS: Status post hysterectomy. No adnexal masses were seen. BONES AND SOFT TISSUES: Osseous structures are age-appropriate. No acute bone abnormality. No  lytic or blastic bone lesion. Mild cardiomegaly. IMPRESSION: 1. Rim-enhancing complex fluid collection with non-dependent gas in the anterior peritoneum adjacent to the ileocolic anastomosis, measuring 1.4 x 5.4 x 8.2 cm, possibly communicating with the midline incision and right mid abdominal ileostomy takedown site. The collection is subjacent to and likely extends through the mesh from prior ventral hernia repair. 2. Hyperemic, fluid-filled, and mildly dilated terminal small bowel and colon, with small bowel measuring up to 3.9 cm in diameter, suggesting inflammation and an underlying developing ileus. No evidence of obstruction. 3. Mesenteric edema and peritoneal enhancement, possibly postsurgical or inflammatory, as can be seen with developing peritonitis. 4. Mild intra- and moderate extrahepatic biliary ductal dilation with pneumobilia, likely related to prior sphincterotomy, unchanged from prior examination. Electronically signed by: Dorethia Molt MD 04/15/2024 01:46 AM EDT RP Workstation: [id number]      Assessment/Plan s/p robotic assisted sigmoid colectomy, LOA, drainage of intra-abdominal abscess, takedown of colovesical fistula, repair of bladder and creation of loop ileostomy on 01/12/24 for diverticulitis with colovesical fistula S/p loop ileostomy takedown 9/23 S/P ileocecectomy 9/29 for anastomotic leak  Feculent appearing drainage from ileostomy site with concern for recurrent leak - patient is HD stable, WBC 10K from 12K - CT overnight shows rim enhancing collection with non-dependent gas in the anterior peritoneum adjacent to ileocolic anastomosis possibly communicating with prior ileostomy site and midline, likely extends through ventral mesh, possible ileus, mesenteric edema and peritoneal enhancement - CT with PO contrast today will be more helpful to determine if patient truly has recurrent anastomotic leak - BID WTD to midline where she is not having any significant drainage, WTD  at least BID but PRN to prior ileostomy site for feculent appearing drainage - if drainage becomes large volume then place colostomy/ileostomy pouch over to help control and quantify output. Desitin to surrounding skin  - keep NPO for now and continue abx, may need to consider TPN pending CT results   FEN: NPO, IVF VTE: hold xarelto  ID: [email or web address]  10/9>>  PAF on Xarelto  - currently on hold PAD HTN HLD RA - ?hold prednisone  for now  Psoriasis    I reviewed ED provider notes, last 24 h vitals and pain scores, last 48 h intake and output, last 24 h labs and trends, and last 24 h imaging results.  This care required high  level of medical decision making.   Burnard JONELLE Louder, [facility] 04/15/2024, 9:27 AM Please see Amion for pager number during day hours 7:00am-4:30pm  NO CHARGE - POST-OP

## 2024-04-15 NOTE — Consult Note (Signed)
 WOC consult received, placed on follow-up list for next week, patient known to [facility] nursing group. Secure Chat primary nurse to informed and obtain further information regarding supplies, if needed. It appears wound care and topical order already in place.  Please reconsult if wound worsens in condition and notify provider.   Sherrilyn Hals MSN RN CWOCN WOC [facility]  [phone] (Available from 7-3 pm Mon-Friday)

## 2024-04-15 NOTE — Consult Note (Signed)
 WOC Nurse ostomy consult note Ileocecectomy: nurse request for bedside consult for guidance on ostomy appliance application; appliance leaking Stoma type/location: right lower quadrant Stomal assessment/size: 34 mm oblong shape opening Peristomal assessment: erythema; moisture breakdown due to caustic drainage, peristomal plane flat with adjacent mid-abd wound. Treatment options for stomal/peristomal skin:  Output approx 50 mls in pouch Ostomy pouching: 2pc. #234 pouch,2 1/4 # 644 skin barrier wafers  Education provided: reviewed with patient skin care needs, instructed nurse on how to apply crusting technique around orifice, Desitin topical around the lower quadrant of abd for erythema, rationale for not using powder solo onto skin  Enrolled patient in [employer] DC program: No  Patient placed on follow-up list for next week.  WOC will follow patient and see within 10 days.   Please reconsult if wound worsens in condition and notify provider.   Sherrilyn Hals MSN RN CWOCN [facility]  [phone] (Available from 7-3 pm Mon-Friday)

## 2024-04-16 ENCOUNTER — Other Ambulatory Visit: Payer: Self-pay

## 2024-04-16 ENCOUNTER — Inpatient Hospital Stay (HOSPITAL_COMMUNITY)

## 2024-04-16 LAB — GLUCOSE, CAPILLARY: Glucose-Capillary: 150 mg/dL — ABNORMAL HIGH (ref 70–99)

## 2024-04-16 MED ORDER — TRAVASOL 10 % IV SOLN
INTRAVENOUS | Status: AC
  Filled 2024-04-16: qty 480

## 2024-04-16 MED ORDER — INSULIN ASPART 100 UNIT/ML IJ SOLN
0.0000 [IU] | Freq: Four times a day (QID) | INTRAMUSCULAR | Status: DC
  Administered 2024-04-17 (×2): 2 [IU] via SUBCUTANEOUS
  Administered 2024-04-17: 1 [IU] via SUBCUTANEOUS
  Administered 2024-04-17 – 2024-04-18 (×3): 2 [IU] via SUBCUTANEOUS

## 2024-04-16 MED ORDER — SODIUM CHLORIDE 0.9% FLUSH: 10.0000 mL | INTRAVENOUS | Status: DC | PRN

## 2024-04-16 MED ORDER — KCL IN DEXTROSE-NACL 20-5-0.45 MEQ/L-%-% IV SOLN
INTRAVENOUS | Status: AC
  Filled 2024-04-16: qty 1000

## 2024-04-16 MED ORDER — CHLORHEXIDINE GLUCONATE CLOTH 2 % EX PADS
6.0000 | MEDICATED_PAD | Freq: Every day | CUTANEOUS | Status: DC
  Administered 2024-04-16 – 2024-04-25 (×10): 6 via TOPICAL

## 2024-04-16 NOTE — Progress Notes (Signed)
 Subjective: Had some chills overnight, denies nausea, min abd pain  Objective: Vital signs in last 24 hours: Temp:  [97.5 F (36.4 C)-99 F (37.2 C)] 99 F (37.2 C) (10/11 0526) Pulse Rate:  [67-74] 74 (10/11 0526) Resp:  [15-18] 15 (10/11 0526) BP: (102-134)/(37-58) 117/44 (10/11 0526) SpO2:  [95 %-99 %] 95 % (10/11 0526)   Intake/Output from previous day: 10/10 0701 - 10/11 0700 In: 1783.8 [P.O.:1160; I.V.:572.1; IV Piggyback:51.7] Out: 900 [Urine:300; Stool:600] Intake/Output this shift: No intake/output data recorded.   General appearance: alert and cooperative GI: soft, midline wound clean, ileostomy site with bilious output covered with ostomy bag    Lab Results:  Recent Labs    04/14/24 2222 04/15/24 0453  WBC 12.2* 10.9*  HGB 10.5* 10.2*  HCT 33.3* 33.6*  PLT 739* 730*   BMET Recent Labs    04/14/24 2222 04/15/24 0453  NA 132* 132*  K 4.3 3.6  CL 98 99  CO2 20* 21*  GLUCOSE 101* 85  BUN 12 11  CREATININE 0.92 0.85  CALCIUM  8.9 8.6*   PT/INR Recent Labs    04/15/24 1658  LABPROT 14.3  INR 1.1   ABG No results for input(s): PHART, HCO3 in the last 72 hours.  Invalid input(s): PCO2, PO2  MEDS, Scheduled  amiodarone   100 mg Oral QHS   amLODipine   5 mg Oral Daily   atorvastatin   80 mg Oral QHS   liver oil-zinc oxide   Topical BID   metoprolol  succinate  50 mg Oral BID    Studies/Results: US  EKG SITE RITE Result Date: 04/16/2024 If Site Rite image not attached, placement could not be confirmed due to current cardiac rhythm.  CT ABDOMEN PELVIS W CONTRAST Result Date: 04/15/2024 CLINICAL DATA:  Feculent drainage from prior ileostomy site. Repeat evaluation with oral contrast for evaluation of recurrent anastomotic leak. EXAM: CT ABDOMEN AND PELVIS WITH CONTRAST TECHNIQUE: Multidetector CT imaging of the abdomen and pelvis was performed using the standard protocol following bolus administration of intravenous contrast. RADIATION  DOSE REDUCTION: This exam was performed according to the departmental dose-optimization program which includes automated exposure control, adjustment of the mA and/or kV according to patient size and/or use of iterative reconstruction technique. CONTRAST:  OMNIPAQUE  IOHEXOL  300 MG/ML  SOLN COMPARISON:  Earlier same day CT abdomen and pelvis dated 04/15/2024 FINDINGS: Lower chest: No focal consolidation or pulmonary nodule in the lung bases. No pleural effusion or pneumothorax demonstrated. Partially imaged heart size is normal. Hepatobiliary: No focal hepatic lesions. Similar pneumobilia. Gallbladder contains gas and stones. Pancreas: No focal lesions or main ductal dilation. Spleen: Normal in size without focal abnormality. Adrenals/Urinary Tract: No adrenal nodules. No suspicious renal mass, calculi or hydronephrosis. Punctate nonobstructing bilateral renal stones. Scattered subcentimeter hypodensities, too small to characterize but likely cysts. Excreted contrast material within the urinary bladder. No filling defects. Stomach/Bowel: Normal appearance of the stomach. Gas-containing proximal duodenal diverticula. Enteric contrast material throughout the small bowel, to the level of the rectum. Postsurgical changes of small bowel and ascending and rectosigmoid colon. Mild mural thickening of small bowel loop abutting the anterior peritoneum and gas-containing fluid collection. Colonic diverticulosis without acute diverticulitis. Vascular/Lymphatic: Aortic atherosclerosis. No enlarged abdominal or pelvic lymph nodes. Reproductive: No adnexal masses. Other: Again seen is gas-containing fluid collection within the right lower anterior abdomen extending from the midline surgical defect to the cecal anastomosis, which now contains hyperattenuating contrast material (2: 45-55), contiguous with the distal large bowel anastomosis (5:26). Persistent right lower  quadrant mesenteric edema and stranding. Musculoskeletal:  No acute or abnormal lytic or blastic osseous lesions. IMPRESSION: 1. Findings consistent with anastomotic leak. 2. Mild mural thickening of small bowel loop abutting the anterior peritoneum and gas-containing fluid collection, likely reactive. 3.  Aortic Atherosclerosis (ICD10-I70.0). Electronically Signed   By: Limin  Xu M.D.   On: 04/15/2024 19:41   CT ABDOMEN PELVIS W CONTRAST Result Date: 04/15/2024 EXAM: CT ABDOMEN AND PELVIS WITH CONTRAST 04/15/2024 01:31:27 AM TECHNIQUE: CT of the abdomen and pelvis was performed with the administration of intravenous contrast, 100 mL of iohexol  (OMNIPAQUE ) 300 MG/ML solution. Multiplanar reformatted images are provided for review. Automated exposure control, iterative reconstruction, and/or weight-based adjustment of the mA/kV was utilized to reduce the radiation dose to as low as reasonably achievable. COMPARISON: Compared to April 03, 2024. CLINICAL HISTORY: Polytrauma, blunt. Abdominal pain and stoma leakage started today, 2 weeks s/p surgery for bowel perforation, wbc's 12.2, GFR>60. FINDINGS: LOWER CHEST: No acute abnormality. LIVER: The liver is unremarkable, with scattered areas of pneumobilia within the expected bile duct and nondependent intrahepatic biliary tree, suggesting prior sphincterotomy, which appears unchanged from prior examination. GALLBLADDER AND BILE DUCTS: Cholelithiasis without superimposed pericholecystic inflammatory change. Mild intra and moderate extrahepatic biliary ductal dilation. SPLEEN: No acute abnormality. PANCREAS: No acute abnormality. ADRENAL GLANDS: No acute abnormality. KIDNEYS, URETERS AND BLADDER: Scattered cortical hypodensities are identified within the kidneys bilaterally, which are too small to characterize but likely represent multiple cortical cysts. No follow-up imaging is recommended for these lesions. Punctate nonobstructing calculi are seen within the kidneys bilaterally, measuring 2-3 mm in size. No ureteral  calculi. No hydronephrosis. No perinephric inflammatory stranding or fluid collections were identified. The bladder is unremarkable. GI AND BOWEL: Moderate descending and sigmoid colon diverticulosis. Surgical changes of sigmoid and right hemicolectomy are identified. There is a rim-enhancing complex fluid collection dictating non-dependent gas seen within the anterior peritoneum adjacent to the ileocolic anastomosis, measuring 1.4 x 5.4 x 8.2 cm. The fluid appears to track superiorly and may communicate with the midline incision and right mid abdominal ileostomy takedown site. Surgical changes of ventral hernia repair with mesh have been performed, with this complex fluid collection urinary subjacent to and likely extending through the mesh. The terminal small bowel and colon appear hyperemic, fluid-filled, and mildly dilated, with a small bowel measuring up to 3.9 cm in diameter, suggesting inflammation and an underlying developing ileus. There is mesenteric edema and peritoneal enhancement, which may be postsurgical or inflammatory in nature, as can be seen with developing peritonitis. No evidence of obstruction. PERITONEUM AND RETROPERITONEUM: There is a complex fluid collection with non-dependent gas seen within the anterior peritoneum adjacent to the ileocolic anastomosis, which may communicate with the midline incision and right mid abdominal ileostomy takedown site. VASCULATURE: Right coronary artery calcification. Extent to the other iliac atherosclerotic calcifications. LYMPH NODES: No lymphadenopathy. REPRODUCTIVE ORGANS: Status post hysterectomy. No adnexal masses were seen. BONES AND SOFT TISSUES: Osseous structures are age-appropriate. No acute bone abnormality. No lytic or blastic bone lesion. Mild cardiomegaly. IMPRESSION: 1. Rim-enhancing complex fluid collection with non-dependent gas in the anterior peritoneum adjacent to the ileocolic anastomosis, measuring 1.4 x 5.4 x 8.2 cm, possibly  communicating with the midline incision and right mid abdominal ileostomy takedown site. The collection is subjacent to and likely extends through the mesh from prior ventral hernia repair. 2. Hyperemic, fluid-filled, and mildly dilated terminal small bowel and colon, with small bowel measuring up to 3.9 cm in diameter, suggesting inflammation and  an underlying developing ileus. No evidence of obstruction. 3. Mesenteric edema and peritoneal enhancement, possibly postsurgical or inflammatory, as can be seen with developing peritonitis. 4. Mild intra- and moderate extrahepatic biliary ductal dilation with pneumobilia, likely related to prior sphincterotomy, unchanged from prior examination. 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   Medication management 12/08/2023   Intra-abdominal infection 12/08/2023   PICC (peripherally inserted central catheter) in place 12/08/2023   Paroxysmal A-fib (HCC) 11/12/2023   Acute diverticulitis 11/11/2023   Diverticulitis 09/18/2022   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   AVM (arteriovenous malformation) of small bowel, 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 F with anastomotic leak after ileocecectomy: high output fistula  -IR consulted but fluid collection is too small for drainage, currently being controlled with ostomy bag over wound  Plan: Cont NPO Will start TPN, place PICC    LOS: 1 day     .Bernarda JAYSON Ned, MD [facility], [place]    04/16/2024 8:29 AM

## 2024-04-16 NOTE — Progress Notes (Signed)
 Patient ID: Olivia [name], female   DOB: 30-Nov-1955, 68 y.o.   MRN: [id number] Request received for drainage of possible abdominal abscess in patient. Latest imaging studies were reviewed by Drs. Henn/Mugweru. There is a small subfascial air and fluid collection inferior to laparotomy incision and beneath mesh. This is a small collection without percutaneous access other than through mesh. No VIR intervention planned at this time. If continued concern then reimage in 3-5 days. For any additional questions Dr. Hughes is on call this weekend- pager [phone] or via secure chat.

## 2024-04-16 NOTE — Plan of Care (Signed)

## 2024-04-16 NOTE — Progress Notes (Signed)
 PHARMACY - TOTAL PARENTERAL NUTRITION CONSULT NOTE   Indication: EC fistula  Patient Measurements: Height: 5' 3 (160 cm) Weight: 58.5 kg (128 lb 15.5 oz) IBW/kg (Calculated) : 52.4 TPN AdjBW (KG): 58.5 Body mass index is 22.85 kg/m. Usual Weight:   Assessment: Presented to ED 10/9 with with abdominal pain and feculent drainage from prior ileostomy site. See recent surgical history below. Previously required TPN and was last discharged home 04/12/24. Now with Feculent appearing drainage from ileostomy site with concern for recurrent leak.   Glucose / Insulin: no h/o DM.  Electrolytes: Na 132, K 3.6, Renal: Scr <1 Hepatic: LFT's WNL, Albumin  3.2 Intake / Output; MIVF: D545NS +20K+ at 64ml/hr  GI Imaging: - 9/28: CT and/pelvis: Postoperative changes from colostomy reversal. Small fluid collection noted in the anterior abdomen at the prior ostomy level. Dilated small bowel loops into the pelvis. Terminal ileum is decompressed. Cannot exclude distal small bowel obstruction. - 10/3 abd xray: Several air-filled mildly dilated small bowel loops in the central Abdomen. 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 9/23: S/p loop ileostomy takedown  9/29: S/P ileocecectomy for anastomotic leak   Central access:  TPN start date: Restart 04/16/24 - Previous TPN 10/3-10/7 (discharged home)  Nutritional Goals: Goal TPN rate is 80 mL/hr (provides 96 g of protein and 1993 kcals per day)   RD Assessment: Estimated Needs Total Energy Estimated Needs: 1900-2100 Total Protein Estimated Needs: 90-105g Total Fluid Estimated  Needs: 2L/day  Current Nutrition:  NPO  Plan:  Start TPN at 40 mL/hr at 1800 Electrolytes in TPN: Na 71mEq/L, K 42mEq/L, Ca 46mEq/L, Mg 25mEq/L, and Phos 15mmol/L. Cl:Ac 1:1 Add standard MVI and trace elements to TPN Initiate Sensitive q6h SSI and adjust as needed  Reduce MIVF to 35 mL/hr at 1800 Monitor TPN labs on Mon/Thurs, PRN   Mayli Covington Karoline Marina, PharmD, BCPS Clinical Staff Pharmacist Marina Salines Stillinger 04/16/2024,10:17 AM

## 2024-04-16 NOTE — Plan of Care (Signed)
   Problem: Clinical Measurements: Goal: Diagnostic test results will improve Outcome: Progressing

## 2024-04-16 NOTE — Progress Notes (Signed)
 Peripherally Inserted Central Catheter Placement  The IV Nurse has discussed with the patient and/or persons authorized to consent for the patient, the purpose of this procedure and the potential benefits and risks involved with this procedure.  The benefits include less needle sticks, lab draws from the catheter, and the patient may be discharged home with the catheter. Risks include, but not limited to, infection, bleeding, blood clot (thrombus formation), and puncture of an artery; nerve damage and irregular heartbeat and possibility to perform a PICC exchange if needed/ordered by physician.  Alternatives to this procedure were also discussed.  Bard Power PICC patient education guide, fact sheet on infection prevention and patient information card has been provided to patient /or left at bedside.    PICC Placement Documentation  PICC Double Lumen 04/16/24 Left Brachial 38 cm (Active)  Indication for Insertion or Continuance of Line Administration of hyperosmolar/irritating solutions (i.e. TPN, Vancomycin, etc.) 04/16/24 1917  Exposed Catheter (cm) 0 cm 04/16/24 1917  Site Assessment Clean, Dry, Intact 04/16/24 1917  Lumen #1 Status Flushed;Saline locked;Blood return noted 04/16/24 1917  Lumen #2 Status Flushed;Saline locked;Blood return noted 04/16/24 1917  Dressing Type Transparent;Securing device 04/16/24 1917  Dressing Status Antimicrobial disc/dressing in place;Clean, Dry, Intact 04/16/24 1917  Line Care Connections checked and tightened 04/16/24 1917  Line Adjustment (NICU/IV Team Only) No 04/16/24 1917  Dressing Intervention New dressing;Adhesive placed at insertion site (IV team only);Adhesive placed around edges of dressing (IV team/ICU RN only) 04/16/24 1917  Dressing Change Due 04/23/24 04/16/24 1917     PICC Double Lumen 04/16/24 Right Brachial 35 cm 0 cm (Active)       Olivia [name] 04/16/2024, 7:17 PM

## 2024-04-17 LAB — COMPREHENSIVE METABOLIC PANEL WITH GFR
ALT: 14 U/L (ref 0–44)
AST: 15 U/L (ref 15–41)
Albumin: 2.6 g/dL — ABNORMAL LOW (ref 3.5–5.0)
Alkaline Phosphatase: 66 U/L (ref 38–126)
Anion gap: 8 (ref 5–15)
BUN: 5 mg/dL — ABNORMAL LOW (ref 8–23)
CO2: 21 mmol/L — ABNORMAL LOW (ref 22–32)
Calcium: 8 mg/dL — ABNORMAL LOW (ref 8.9–10.3)
Chloride: 107 mmol/L (ref 98–111)
Creatinine, Ser: 0.63 mg/dL (ref 0.44–1.00)
GFR, Estimated: >60 mL/min (ref >60)
Glucose, Bld: 155 mg/dL — ABNORMAL HIGH (ref 70–99)
Potassium: 3.6 mmol/L (ref 3.5–5.1)
Sodium: 137 mmol/L (ref 135–145)
Total Bilirubin: 0.3 mg/dL (ref 0.0–1.2)
Total Protein: 5.4 g/dL — ABNORMAL LOW (ref 6.5–8.1)

## 2024-04-17 LAB — PHOSPHORUS: Phosphorus: 2.3 mg/dL — ABNORMAL LOW (ref 2.5–4.6)

## 2024-04-17 LAB — GLUCOSE, CAPILLARY
Glucose-Capillary: 182 mg/dL — ABNORMAL HIGH (ref 70–99)
Glucose-Capillary: 198 mg/dL — ABNORMAL HIGH (ref 70–99)
Glucose-Capillary: 188 mg/dL — ABNORMAL HIGH (ref 70–99)
Glucose-Capillary: 181 mg/dL — ABNORMAL HIGH (ref 70–99)

## 2024-04-17 LAB — MAGNESIUM: Magnesium: 2 mg/dL (ref 1.7–2.4)

## 2024-04-17 MED ORDER — TRAVASOL 10 % IV SOLN
INTRAVENOUS | Status: AC
  Filled 2024-04-17: qty 960

## 2024-04-17 MED ORDER — KCL IN DEXTROSE-NACL 20-5-0.45 MEQ/L-%-% IV SOLN
INTRAVENOUS | Status: DC
  Filled 2024-04-17: qty 1000

## 2024-04-17 NOTE — Progress Notes (Addendum)
 Subjective: Feeling better, min abd pain.  Worried about her husband who is also in the hospital.  Objective: Vital signs in last 24 hours: Temp:  [98.2 F (36.8 C)-98.5 F (36.9 C)] 98.2 F (36.8 C) (10/12 0522) Pulse Rate:  [66-71] 66 (10/12 0522) Resp:  [15-16] 15 (10/12 0522) BP: (109-133)/(43-51) 109/43 (10/12 0522) SpO2:  [98 %-100 %] 98 % (10/12 0522) Weight:  [62.7 kg] 62.7 kg (10/12 0620)   Intake/Output from previous day: 10/11 0701 - 10/12 0700 In: 1441.9 [I.V.:1215; IV Piggyback:227] Out: 200 [Stool:200] Intake/Output this shift: No intake/output data recorded.   General appearance: alert and cooperative GI: soft, midline wound clean, ileostomy site with feculent output covered with ostomy bag    Lab Results:  Recent Labs    04/14/24 2222 04/15/24 0453  WBC 12.2* 10.9*  HGB 10.5* 10.2*  HCT 33.3* 33.6*  PLT 739* 730*   BMET Recent Labs    04/15/24 0453 04/17/24 0307  NA 132* 137  K 3.6 3.6  CL 99 107  CO2 21* 21*  GLUCOSE 85 155*  BUN 11 5*  CREATININE 0.85 0.63  CALCIUM  8.6* 8.0*   PT/INR Recent Labs    04/15/24 1658  LABPROT 14.3  INR 1.1   ABG No results for input(s): PHART, HCO3 in the last 72 hours.  Invalid input(s): PCO2, PO2  MEDS, Scheduled  amiodarone   100 mg Oral QHS   amLODipine   5 mg Oral Daily   atorvastatin   80 mg Oral QHS   Chlorhexidine  Gluconate Cloth  6 each Topical Daily   insulin aspart  0-9 Units Subcutaneous Q6H   liver oil-zinc oxide   Topical BID   metoprolol  succinate  50 mg Oral BID    Studies/Results: DG CHEST PORT 1 VIEW Result Date: 04/16/2024 CLINICAL DATA:  Check PICC placement EXAM: PORTABLE CHEST 1 VIEW COMPARISON:  None Available. FINDINGS: Cardiac shadow is enlarged. Aortic calcifications are noted. The lungs are clear bilaterally. Right PICC is noted with the catheter tip in the distal superior vena cava. No other focal abnormality is noted. IMPRESSION: Right PICC in satisfactory  position. Electronically Signed   By: Oneil Devonshire M.D.   On: 04/16/2024 19:53   US  EKG SITE RITE Result Date: 04/16/2024 If Site Rite image not attached, placement could not be confirmed due to current cardiac rhythm.  CT ABDOMEN PELVIS W CONTRAST Result Date: 04/15/2024 CLINICAL DATA:  Feculent drainage from prior ileostomy site. Repeat evaluation with oral contrast for evaluation of recurrent anastomotic leak. EXAM: CT ABDOMEN AND PELVIS WITH CONTRAST TECHNIQUE: Multidetector CT imaging of the abdomen and pelvis was performed using the standard protocol following bolus administration of intravenous contrast. RADIATION DOSE REDUCTION: This exam was performed according to the departmental dose-optimization program which includes automated exposure control, adjustment of the mA and/or kV according to patient size and/or use of iterative reconstruction technique. CONTRAST:  OMNIPAQUE  IOHEXOL  300 MG/ML  SOLN COMPARISON:  Earlier same day CT abdomen and pelvis dated 04/15/2024 FINDINGS: Lower chest: No focal consolidation or pulmonary nodule in the lung bases. No pleural effusion or pneumothorax demonstrated. Partially imaged heart size is normal. Hepatobiliary: No focal hepatic lesions. Similar pneumobilia. Gallbladder contains gas and stones. Pancreas: No focal lesions or main ductal dilation. Spleen: Normal in size without focal abnormality. Adrenals/Urinary Tract: No adrenal nodules. No suspicious renal mass, calculi or hydronephrosis. Punctate nonobstructing bilateral renal stones. Scattered subcentimeter hypodensities, too small to characterize but likely cysts. Excreted contrast material within the urinary bladder. No  filling defects. Stomach/Bowel: Normal appearance of the stomach. Gas-containing proximal duodenal diverticula. Enteric contrast material throughout the small bowel, to the level of the rectum. Postsurgical changes of small bowel and ascending and rectosigmoid colon. Mild mural  thickening of small bowel loop abutting the anterior peritoneum and gas-containing fluid collection. Colonic diverticulosis without acute diverticulitis. Vascular/Lymphatic: Aortic atherosclerosis. No enlarged abdominal or pelvic lymph nodes. Reproductive: No adnexal masses. Other: Again seen is gas-containing fluid collection within the right lower anterior abdomen extending from the midline surgical defect to the cecal anastomosis, which now contains hyperattenuating contrast material (2: 45-55), contiguous with the distal large bowel anastomosis (5:26). Persistent right lower quadrant mesenteric edema and stranding. Musculoskeletal: No acute or abnormal lytic or blastic osseous lesions. IMPRESSION: 1. Findings consistent with anastomotic leak. 2. Mild mural thickening of small bowel loop abutting the anterior peritoneum and gas-containing fluid collection, likely reactive. 3.  Aortic Atherosclerosis (ICD10-I70.0). Electronically Signed   By: Limin  Xu M.D.   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   Medication management 12/08/2023   Intra-abdominal infection 12/08/2023   PICC (peripherally inserted central catheter) in place 12/08/2023   Paroxysmal A-fib (HCC) 11/12/2023   Acute diverticulitis 11/11/2023   Diverticulitis 09/18/2022   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   AVM (arteriovenous malformation) of small bowel, 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 F with anastomotic leak after ileocecectomy: high output fistula  -IR consulted but fluid collection is too small for drainage, currently being controlled with ostomy bag over wound  Plan: Cont NPO Cont TPN, place PICC TOC consult: may need home TPN    LOS: 2 days     .Bernarda JAYSON Ned, MD [facility], [place]    04/17/2024 8:42 AM

## 2024-04-17 NOTE — Progress Notes (Signed)
 PHARMACY - TOTAL PARENTERAL NUTRITION CONSULT NOTE   Indication: EC fistula  Patient Measurements: Height: 5' 3 (160 cm) Weight: 62.7 kg (138 lb 3.7 oz) IBW/kg (Calculated) : 52.4 TPN AdjBW (KG): 58.5 Body mass index is 24.49 kg/m. Usual Weight:   Assessment: Presented to ED 10/9 with with abdominal pain and feculent drainage from prior ileostomy site. See recent surgical history below. Previously required TPN and was last discharged home 04/12/24. Now with Feculent appearing drainage from ileostomy site with concern for recurrent leak.   Glucose / Insulin: no h/o DM.  - CBGs 150, 182 so far, 3 units SSI given  Electrolytes: Na 137, K 3.6,, Phos 2.3 slightly low, Mg 2  Renal: Scr <1, BUN 5  Hepatic: LFT's WNL, Albumin  3.2>2.6 Intake / Output; MIVF: D545NS +20K+ at 68ml/hr PO: none UOP: none documented LBM: 10/12, 200 stool  GI Imaging: - 9/28: CT and/pelvis: Postoperative changes from colostomy reversal. Small fluid collection noted in the anterior abdomen at the prior ostomy level. Dilated small bowel loops into the pelvis. Terminal ileum is decompressed. Cannot exclude distal small bowel obstruction. - 10/3 abd xray: Several air-filled mildly dilated small bowel loops in the central Abdomen. 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 9/23: S/p loop ileostomy takedown  9/29: S/P ileocecectomy for anastomotic leak   Central access:  TPN start date: Restart 04/16/24 - Previous TPN 10/3-10/7 ( then discharged home)  Nutritional Goals: Goal TPN rate is 80 mL/hr (provides 96 g of protein and 1993 kcals per  day)   RD Assessment: Estimated Needs Total Energy Estimated Needs: 1900-2100 Total Protein Estimated Needs: 90-105g Total Fluid Estimated Needs: 2L/day  Current Nutrition:  NPO  Plan:  Increase TPN to 80 mL/hr at 1800 Electrolytes in TPN: Na 4mEq/L, K 24mEq/L, Ca 78mEq/L, Mg 16mEq/L, and Phos 15mmol/L. Cl:Ac 1:1 Add standard MVI and trace elements to TPN Initiate Sensitive q6h SSI and adjust as needed  Reduce MIVF to kvo Monitor TPN labs on Mon/Thurs, PRN Extra 15mmol phos in TPN tonight with rate increase   Olivia [name] Karoline Marina, PharmD, BCPS Clinical Staff Pharmacist [facility] 04/17/2024,9:31 AM

## 2024-04-17 NOTE — Plan of Care (Signed)
   Problem: Clinical Measurements: Goal: Diagnostic test results will improve Outcome: Progressing

## 2024-04-18 LAB — COMPREHENSIVE METABOLIC PANEL WITH GFR
ALT: 13 U/L (ref 0–44)
AST: 13 U/L — ABNORMAL LOW (ref 15–41)
Albumin: 2.7 g/dL — ABNORMAL LOW (ref 3.5–5.0)
Alkaline Phosphatase: 64 U/L (ref 38–126)
Anion gap: 9 (ref 5–15)
BUN: 11 mg/dL (ref 8–23)
CO2: 22 mmol/L (ref 22–32)
Calcium: 8.2 mg/dL — ABNORMAL LOW (ref 8.9–10.3)
Chloride: 109 mmol/L (ref 98–111)
Creatinine, Ser: 0.59 mg/dL (ref 0.44–1.00)
GFR, Estimated: >60 mL/min (ref >60)
Glucose, Bld: 123 mg/dL — ABNORMAL HIGH (ref 70–99)
Potassium: 3.9 mmol/L (ref 3.5–5.1)
Sodium: 139 mmol/L (ref 135–145)
Total Bilirubin: <0.2 mg/dL (ref 0.0–1.2)
Total Protein: 5.5 g/dL — ABNORMAL LOW (ref 6.5–8.1)

## 2024-04-18 LAB — GLUCOSE, CAPILLARY
Glucose-Capillary: 168 mg/dL — ABNORMAL HIGH (ref 70–99)
Glucose-Capillary: 159 mg/dL — ABNORMAL HIGH (ref 70–99)
Glucose-Capillary: 141 mg/dL — ABNORMAL HIGH (ref 70–99)
Glucose-Capillary: 117 mg/dL — ABNORMAL HIGH (ref 70–99)

## 2024-04-18 LAB — MAGNESIUM: Magnesium: 1.9 mg/dL (ref 1.7–2.4)

## 2024-04-18 LAB — TRIGLYCERIDES: Triglycerides: 116 mg/dL (ref <150)

## 2024-04-18 LAB — PHOSPHORUS: Phosphorus: 2.4 mg/dL — ABNORMAL LOW (ref 2.5–4.6)

## 2024-04-18 MED ORDER — ENOXAPARIN SODIUM 30 MG/0.3ML IJ SOSY
30.0000 mg | PREFILLED_SYRINGE | INTRAMUSCULAR | Status: DC
  Administered 2024-04-18 – 2024-04-19 (×2): 30 mg via SUBCUTANEOUS
  Filled 2024-04-18 (×2): qty 0.3

## 2024-04-18 MED ORDER — GABAPENTIN 100 MG PO CAPS
300.0000 mg | ORAL_CAPSULE | Freq: Three times a day (TID) | ORAL | Status: DC
  Administered 2024-04-18 – 2024-04-26 (×25): 300 mg via ORAL
  Filled 2024-04-18 (×26): qty 3

## 2024-04-18 MED ORDER — INSULIN ASPART 100 UNIT/ML IJ SOLN
0.0000 [IU] | Freq: Four times a day (QID) | INTRAMUSCULAR | Status: DC
  Administered 2024-04-18: 3 [IU] via SUBCUTANEOUS
  Administered 2024-04-18 – 2024-04-23 (×16): 2 [IU] via SUBCUTANEOUS

## 2024-04-18 MED ORDER — LORAZEPAM 0.5 MG PO TABS
0.5000 mg | ORAL_TABLET | Freq: Four times a day (QID) | ORAL | Status: DC | PRN
  Administered 2024-04-18 (×2): 0.5 mg via ORAL
  Filled 2024-04-18 (×2): qty 1

## 2024-04-18 MED ORDER — POTASSIUM PHOSPHATES 15 MMOLE/5ML IV SOLN
15.0000 mmol | Freq: Once | INTRAVENOUS | Status: AC
  Administered 2024-04-18: 15 mmol via INTRAVENOUS
  Filled 2024-04-18: qty 5

## 2024-04-18 MED ORDER — TRAVASOL 10 % IV SOLN
INTRAVENOUS | Status: AC
  Filled 2024-04-18: qty 960

## 2024-04-18 MED ORDER — SODIUM CHLORIDE 0.45 % IV SOLN: INTRAVENOUS | Status: AC

## 2024-04-18 NOTE — Consult Note (Addendum)
 WOC Nurse ostomy follow up Surgical team following for assessment and plan of care for abd wound. Please refer to their team for further questions.   Pt had ostomy reversal surgery performed on 9/23 and ileocecectomy performed to the location on 9/29.  She is familiar with pouch application and emptying routines since she was independent with ostomy care prior to this admission.  She states she still has barrier rings and one piece pouches at home for use.  Discussed differences in this location, site is flush with skin level, 1 3/4 inches and oval. No visible stoma.  Emptied 100cc liquid green stool.  Applied barrier ring and one piece flexible pouch.  Ordered 5 sets of supplies to the room for staff nurses' use: use supplies: barrier ring, Lawson # [phone] and one piece flexible pouch Lawson # [id number].  Pt does not need to be placed on Secure Start program.    WOC team will assess weekly for further needs.  Thank-you,  Stephane Fought MSN, RN, CWOCN, CWCN-AP, CNS Contact Mon-Fri 0700-1500: [phone]

## 2024-04-18 NOTE — Plan of Care (Signed)

## 2024-04-18 NOTE — Progress Notes (Signed)
      Chief Complaint/Subjective: Anxious about husband, BM per rectum today, minimal abdominal pain  Objective: Vital signs in last 24 hours: Temp:  [98.1 F (36.7 C)-98.6 F (37 C)] 98.1 F (36.7 C) (10/13 0511) Pulse Rate:  [72-76] 76 (10/13 0511) Resp:  [16-20] 20 (10/13 0511) BP: (93-123)/(51-69) 93/69 (10/13 0511) SpO2:  [99 %-100 %] 100 % (10/13 0511) Last BM Date : 04/17/24 Intake/Output from previous day: 10/12 0701 - 10/13 0700 In: 1875.5 [I.V.:1721.5; IV Piggyback:154.1] Out: 200 [Stool:200]  PE: Gen: NAD Resp: nonlabored Card: RRR Abd: right wound with feculent drainage, open wound with healthy granulation tissue  Lab Results:  No results for input(s): WBC, HGB, HCT, PLT in the last 72 hours. Recent Labs    04/17/24 0307 04/18/24 0207  NA 137 139  K 3.6 3.9  CL 107 109  CO2 21* 22  GLUCOSE 155* 123*  BUN 5* 11  CREATININE 0.63 0.59  CALCIUM  8.0* 8.2*   Recent Labs    04/15/24 1658  LABPROT 14.3  INR 1.1      Component Value Date/Time   NA 139 04/18/2024 0207   NA 143 12/07/2019 0751   K 3.9 04/18/2024 0207   CL 109 04/18/2024 0207   CO2 22 04/18/2024 0207   GLUCOSE 123 (H) 04/18/2024 0207   BUN 11 04/18/2024 0207   BUN 15 12/07/2019 0751   CREATININE 0.59 04/18/2024 0207   CREATININE 0.79 12/31/2023 1027   CALCIUM  8.2 (L) 04/18/2024 0207   PROT 5.5 (L) 04/18/2024 0207   PROT 6.4 09/07/2020 1311   ALBUMIN  2.7 (L) 04/18/2024 0207   ALBUMIN  3.9 09/07/2020 1311   AST 13 (L) 04/18/2024 0207   ALT 13 04/18/2024 0207   ALKPHOS 64 04/18/2024 0207   BILITOT <0.2 04/18/2024 0207   BILITOT <0.2 09/07/2020 1311   GFRNONAA >60 04/18/2024 0207   GFRAA >60 01/03/2020 1357    Assessment/Plan  Ileostomy reversal with leak s/p revision now with second leak. Plan to fistulize and continue TPN, continue open wound care FEN - ice chips, TPN VTE - start lovenox  ID - [email or web address]  10/10-> Disposition - inpatient   LOS: 3 days   I reviewed last  24 h vitals and pain scores, last 48 h intake and output, last 24 h labs and trends, and last 24 h imaging results.  This care required moderate level of medical decision making.   [name] [facility] Surgery at [facility] 04/18/2024, 11:05 AM Please see Amion for pager number during day hours 7:00am-4:30pm or 7:00am -11:30am on weekends

## 2024-04-18 NOTE — TOC Initial Note (Signed)
 Transition of Care [facility]) - Initial/Assessment Note    Patient Details  Name: Olivia [name] MRN: [id number] Date of Birth: [birth year]  [facility]) CM/SW Contact:    Heather DELENA Saltness, LCSW Phone Number: 04/18/2024, 10:13 AM  Clinical Narrative:                 CSW met with pt at bedside to discuss discharge planning. Pt currently lives at home with husband. Pt reports husband was admitted to [facility] this morning. Pt reports [facility] wound vac, currently receiving HH RN services [facility]. CSW spoke with representative at [facility], via phone call at [phone], to confirm [facility] services upon discharge for [facility] RN. ROC orders will need to faxed to [phone]. TOC will continue to follow.    Expected Discharge Plan: Home w Home Health Services Barriers to Discharge: Continued Medical Work up   Patient Goals and CMS Choice Patient states their goals for this hospitalization and ongoing recovery are:: To return home        Expected Discharge Plan and Services In-house Referral: Clinical Social Work Discharge Planning Services: NA Post Acute Care Choice: Home Health Living arrangements for the past 2 months: Single Family Home                 DME Arranged: N/A         HH Arranged: RN HH Agency: Other - See comment ([facility]) Date HH Agency Contacted: 04/18/24 Time HH Agency Contacted: 1012    Prior Living Arrangements/Services Living arrangements for the past 2 months: Single Family Home Lives with:: Spouse Patient language and need for interpreter reviewed:: Yes Do you feel safe going back to the place where you live?: Yes      Need for Family Participation in Patient Care: Yes (Comment) Care giver support system in place?: Yes (comment) Current home services: Home RN Criminal Activity/Legal Involvement Pertinent to Current Situation/Hospitalization: No - Comment as needed  Activities of Daily Living   ADL Screening (condition at  time of admission) Independently performs ADLs?: Yes (appropriate for developmental age) Is the patient deaf or have difficulty hearing?: No Does the patient have difficulty seeing, even when wearing glasses/contacts?: No Does the patient have difficulty concentrating, remembering, or making decisions?: No  Permission Sought/Granted Permission sought to share information with : Case Manager Permission granted to share information with : Yes, Verbal Permission Granted  Share Information with NAME: Olivia [name]  Permission granted to share info w AGENCY: [facility]  Permission granted to share info w Relationship: Spouse  Permission granted to share info w Contact Information: [phone]  Emotional Assessment Appearance:: Appears stated age Attitude/Demeanor/Rapport: Engaged Affect (typically observed): Stable, Pleasant, Accepting, Appropriate Orientation: : Oriented to Self, Oriented to Place, Oriented to  Time, Oriented to Situation Alcohol / Substance Use: Not Applicable Psych Involvement: No (comment)  Admission diagnosis:  Anastomotic leak of intestine [K91.89] Postoperative infection, unspecified type, initial encounter [T81.40XA] Patient Active Problem List   Diagnosis Date Noted   Anastomotic leak of intestine 04/15/2024   Malnutrition of moderate degree 04/11/2024   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   Irritant contact dermatitis associated with fecal stoma 02/08/2024   Ileostomy care (HCC) 02/08/2024   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   Medication management 12/08/2023   Intra-abdominal infection 12/08/2023   PICC (peripherally inserted central catheter) in place 12/08/2023   Paroxysmal A-fib (HCC) 11/12/2023   Acute diverticulitis 11/11/2023   Diverticulitis 09/18/2022  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   AVM (arteriovenous malformation) of small bowel, 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   Chronic gastritis 06/27/2022   Melena    GI bleed 05/25/2020   Leukocytosis  05/25/2020   Thrombocytosis 05/25/2020   Normocytic anemia 05/25/2020   GERD (gastroesophageal reflux disease) 05/25/2020   Dyslipidemia 05/25/2020   Vitamin D deficiency 05/25/2020   Choledocholithiasis 12/26/2019   RUQ pain 09/27/2019   Neck mass 09/27/2019   Elevated LFTs 05/02/2019   Fatty liver 05/02/2019   Rectal bleeding 05/02/2019   Peripheral arterial disease 11/14/2013   Palpitations 05/20/2011   Tobacco abuse disorder 05/20/2011   Panic disorder without agoraphobia 07/13/2007   PCP:  Elvis Venetia [facility]IG[facility]ERS Pharmacy:   [address] [place], [place] - [place] - [address] [place] [address] Phone: [phone] Fax: [phone]  [facility] [place], [place] - [address] [facility] AT Portal to Reg[facility] [address] [place] [address] Phone: [phone] Fax: [phone]     Social Drivers of Health (SDOH) Social History: SDOH Screenings   Food Insecurity: No Food Insecurity (04/15/2024)  Housing: Low Risk  (04/15/2024)  Transportation Needs: No Transportation Needs (04/15/2024)  Utilities: Not At Risk (04/15/2024)  Depression (PHQ2-9): Low Risk  (12/31/2023)  Social Connections: Moderately Integrated (04/15/2024)  Tobacco Use: Medium Risk (04/14/2024)   SDOH Interventions: None indcated     Readmission Risk Interventions    04/18/2024   10:10 AM 04/15/2024    1:23 PM 04/04/2024    9:35 AM  Readmission Risk Prevention Plan  Transportation Screening Complete Complete Complete  PCP or Specialist Appt within 3-5 Days Complete Complete Complete  HRI or Home Care Consult Complete Complete Complete  Social Work Consult for Recovery Care Planning/Counseling Complete Complete Complete  Palliative Care Screening Not Applicable Not Applicable Complete  Medication Review Oceanographer) Complete Complete Complete    Signed: Heather Saltness, MSW, LCSW Clinical  Social Worker Inpatient Care Management 04/18/2024 10:17 AM

## 2024-04-18 NOTE — Progress Notes (Signed)
 PHARMACY - TOTAL PARENTERAL NUTRITION CONSULT NOTE   Indication: EC fistula  Patient Measurements: Height: 5' 3 (160 cm) Weight: 62.7 kg (138 lb 3.7 oz) IBW/kg (Calculated) : 52.4 TPN AdjBW (KG): 58.5 Body mass index is 24.49 kg/m. Usual Weight:   Assessment: Presented to ED 10/9 with with abdominal pain and feculent drainage from prior ileostomy site. See recent surgical history below. Previously required TPN and was last discharged home 04/12/24. Now with Feculent appearing drainage from ileostomy site with concern for recurrent leak.   Glucose / Insulin: no h/o DM.  - CBGs 168-198, 8 units SSI given  Electrolytes: Lytes WNL except Phos low at 2.4.  CorrCa 9.24 Renal: Scr <1, BUN 11 Hepatic: LFT's WNL, Albumin  2.7, TG 116 Intake / Output; MIVF:  - mIVF: D545NS +20K at [facility] - Output:  only stool charted  GI Imaging: - 9/28: CT and/pelvis: Postoperative changes from colostomy reversal. Small fluid collection noted in the anterior abdomen at the prior ostomy level. Dilated small bowel loops into the pelvis. Terminal ileum is decompressed. Cannot exclude distal small bowel obstruction. - 10/3 abd xray: Several air-filled mildly dilated small bowel loops in the central Abdomen. 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 9/23: S/p loop ileostomy takedown  9/29: S/P ileocecectomy for anastomotic leak   Central access:  TPN start date: Restart 04/16/24 - Previous TPN 10/3-10/7 ( then discharged home)  Nutritional Goals: Goal TPN rate is 80 mL/hr (provides 96 g of protein and 1993 kcals per day)   RD  Assessment: Estimated Needs Total Energy Estimated Needs: 1900-2100 Total Protein Estimated Needs: 90-105g Total Fluid Estimated Needs: 2L/day  Current Nutrition:  NPO  Plan:  Now: K Phos 15 mmol IV x1  Continue TPN at 80 mL/hr at 1800 Electrolytes in TPN: Na 44mEq/L, K 98mEq/L, Ca 39mEq/L, Mg 70mEq/L, and Phos 20mmol/L. Cl:Ac 1:1 Add standard MVI and trace elements to TPN Increase to moderate SSI q6h and adjust as needed  Continue mIVF at KVO (change to 0.45% NaCl) Monitor TPN labs on Mon/Thurs, PRN   Wanda Hasting PharmD, BCPS [facility] main pharmacy [phone] 04/18/2024 7:43 AM

## 2024-04-19 LAB — BASIC METABOLIC PANEL WITH GFR
Anion gap: 8 (ref 5–15)
BUN: 15 mg/dL (ref 8–23)
CO2: 22 mmol/L (ref 22–32)
Calcium: 8.4 mg/dL — ABNORMAL LOW (ref 8.9–10.3)
Chloride: 108 mmol/L (ref 98–111)
Creatinine, Ser: 0.53 mg/dL (ref 0.44–1.00)
GFR, Estimated: >60 mL/min (ref >60)
Glucose, Bld: 127 mg/dL — ABNORMAL HIGH (ref 70–99)
Potassium: 3.8 mmol/L (ref 3.5–5.1)
Sodium: 138 mmol/L (ref 135–145)

## 2024-04-19 LAB — MAGNESIUM: Magnesium: 1.9 mg/dL (ref 1.7–2.4)

## 2024-04-19 LAB — GLUCOSE, CAPILLARY
Glucose-Capillary: 125 mg/dL — ABNORMAL HIGH (ref 70–99)
Glucose-Capillary: 133 mg/dL — ABNORMAL HIGH (ref 70–99)
Glucose-Capillary: 126 mg/dL — ABNORMAL HIGH (ref 70–99)

## 2024-04-19 LAB — PHOSPHORUS: Phosphorus: 3 mg/dL (ref 2.5–4.6)

## 2024-04-19 MED ORDER — LORAZEPAM 0.5 MG PO TABS
0.5000 mg | ORAL_TABLET | Freq: Three times a day (TID) | ORAL | Status: DC | PRN
  Administered 2024-04-22 – 2024-04-24 (×3): 0.5 mg via ORAL
  Filled 2024-04-19 (×3): qty 1

## 2024-04-19 MED ORDER — HYDROXYZINE HCL 25 MG PO TABS
25.0000 mg | ORAL_TABLET | Freq: Three times a day (TID) | ORAL | Status: DC | PRN
  Administered 2024-04-19 – 2024-04-22 (×2): 25 mg via ORAL
  Filled 2024-04-19 (×2): qty 1

## 2024-04-19 MED ORDER — TRAVASOL 10 % IV SOLN
INTRAVENOUS | Status: AC
  Filled 2024-04-19: qty 960

## 2024-04-19 MED ORDER — ENOXAPARIN SODIUM 40 MG/0.4ML IJ SOSY
40.0000 mg | PREFILLED_SYRINGE | INTRAMUSCULAR | Status: DC
Start: 2024-04-20 — End: 2024-04-25
  Administered 2024-04-20 – 2024-04-25 (×6): 40 mg via SUBCUTANEOUS
  Filled 2024-04-19 (×6): qty 0.4

## 2024-04-19 NOTE — TOC Progression Note (Signed)
 Transition of Care [facility]) - Progression Note    Patient Details  Name: Olivia [name] MRN: [id number] Date of Birth: May 21, 1956  Transition [facility]) CM/SW Contact  Alfonse JONELLE Rex, RN Phone Number: 04/19/2024, 12:36 PM  Clinical Narrative:   [facility] consult for home TPN and wound care. Referral sent to [facility] ,rep-Pam, for home TPN. Patient dc home with [facility] RN services through [facility]. Dwayne Pope, rep w/[employer], notified patient recently dc home with wound vac, currently admitted but will need wound vac at discharge. Dwayne states she will follow. TOC will continue to follow.     Expected Discharge Plan: Home w Home Health Services Barriers to Discharge: Continued Medical Work up               Expected Discharge Plan and Services In-house Referral: Clinical Social Work Discharge Planning Services: NA Post Acute Care Choice: Home Health Living arrangements for the past 2 months: Single Family Home                 DME Arranged: N/A         HH Arranged: RN HH Agency: Other - See comment ([facility]) Date HH Agency Contacted: 04/18/24 Time HH Agency Contacted: 1012     Social Drivers of Health (SDOH) Interventions SDOH Screenings   Food Insecurity: No Food Insecurity (04/15/2024)  Housing: Low Risk  (04/15/2024)  Transportation Needs: No Transportation Needs (04/15/2024)  Utilities: Not At Risk (04/15/2024)  Depression (PHQ2-9): Low Risk  (12/31/2023)  Social Connections: Moderately Integrated (04/15/2024)  Tobacco Use: Medium Risk (04/14/2024)    Readmission Risk Interventions    04/18/2024   10:10 AM 04/15/2024    1:23 PM 04/04/2024    9:35 AM  Readmission Risk Prevention Plan  Transportation Screening Complete Complete Complete  PCP or Specialist Appt within 3-5 Days Complete Complete Complete  HRI or Home Care Consult Complete Complete Complete  Social Work Consult for Recovery Care Planning/Counseling Complete Complete  Complete  Palliative Care Screening Not Applicable Not Applicable Complete  Medication Review Oceanographer) Complete Complete Complete

## 2024-04-19 NOTE — Progress Notes (Signed)
 Initial Nutrition Assessment  DOCUMENTATION CODES:   Non-severe (moderate) malnutrition in context of chronic illness  INTERVENTION:   -TPN management per Pharmacy -Daily weights while on TPN  NUTRITION DIAGNOSIS:   Moderate Malnutrition related to chronic illness, altered GI function as evidenced by mild fat depletion, mild muscle depletion, percent weight loss.  GOAL:   Patient will meet greater than or equal to 90% of their needs  MONITOR:   Labs (TPN)  REASON FOR ASSESSMENT:    (TPN)    ASSESSMENT:   68 year old female withComplex recent surgical history of diverticulitis s/p robotic assisted sigmoid colectomy, LOA, drainage of intra-abdominal abscess, takedown of colovesical fistula, repair of bladder and creation of loop ileostomy on 01/12/24 for diverticulitis with colovesical fistula. She underwent takedown of loop ileostomy on 9/23 after recovering from initial surgery but developed early post-operative leak at this site. She was taken back to the OR 9/29 for exploratory laparotomy and ileocecectomy, she required additional resection of small bowel for this secondary to significant scar tissue in small bowel. Post-operative course complicated by ileus and she required TPN. She was discharged home on 10/7.  Admitted 10/9 with abdominal pain and feculent drainage from prior ileostomy site.  Patient in room, no family at bedside. Pt doing okay despite husband being in the hospital at [facility]. Pt currently NPO, was restarted on TPN 10/11.  Pt was recently discharged from [facility] on 10/7. Was on TPN during that admission. Diet was advanced to soft at discharge. Pt reports not eating much at discharge and once she got home. Was having pain and not much of an appetite. Was trying to eat applesauce, soups, broths, mashed potatoes, nothing really tasted good to her. Pt was told to come to hospital given increased output in wound VAC.  Will monitor for diet advancement. Plan will be for pt to  discharge home with TPN.  Per weight records, pt has lost 17 lbs since 5/14 (10% wt loss x 5 months, significant for time frame).  Medications reviewed.  Labs reviewed: CBGs: 117-168   NUTRITION - FOCUSED PHYSICAL EXAM:  Flowsheet Row Most Recent Value  Orbital Region Mild depletion  Upper Arm Region Moderate depletion  Thoracic and Lumbar Region No depletion  Buccal Region Mild depletion  Temple Region Mild depletion  Clavicle Bone Region Mild depletion  Clavicle and Acromion Bone Region Mild depletion  Scapular Bone Region Mild depletion  Dorsal Hand Mild depletion  Patellar Region Mild depletion  Anterior Thigh Region Mild depletion  Posterior Calf Region Mild depletion  Edema (RD Assessment) None  Hair Reviewed  Eyes Reviewed  Mouth Reviewed  Skin Reviewed  [bruising on BUEs and BLEs]  Nails Reviewed    Diet Order:   Diet Order             Diet NPO time specified Except for: Ice Chips, Sips with Meds  Diet effective now                   EDUCATION NEEDS:   Education needs have been addressed  Skin:  Skin Integrity Issues:: Other (Comment) Other: 10/10 mid-abdominal wound  Last BM:  10/14  Height:   Ht Readings from Last 1 Encounters:  04/15/24 5' 3 (1.6 m)    Weight:   Wt Readings from Last 1 Encounters:  04/17/24 62.7 kg    BMI:  Body mass index is 24.49 kg/m.  Estimated Nutritional Needs:   Kcal:  1900-2100  Protein:  95-110g  Fluid:  2.1L/day   Morna Lee, MS, RD, LDN Inpatient Clinical Dietitian Contact via Secure chat

## 2024-04-19 NOTE — Progress Notes (Signed)
      Chief Complaint/Subjective: +diarrhea per rectum, pain controlled  Objective: Vital signs in last 24 hours: Temp:  [98 F (36.7 C)-98.9 F (37.2 C)] 98.9 F (37.2 C) (10/14 0523) Pulse Rate:  [67-75] 73 (10/14 0523) Resp:  [16-18] 17 (10/14 0523) BP: (119-130)/(47-54) 119/47 (10/14 0523) SpO2:  [97 %-99 %] 97 % (10/14 0523) Last BM Date : 04/17/24 Intake/Output from previous day: 10/13 0701 - 10/14 0700 In: 1173.6 [I.V.:1045.4; IV Piggyback:128.2] Out: 150 [Stool:150]  PE: Gen: NAD Resp: nonlabored Card: RRR Abd: right open incision with feculent drainage, open midline with healthy tissue  Lab Results:  No results for input(s): WBC, HGB, HCT, PLT in the last 72 hours. Recent Labs    04/18/24 0207 04/19/24 0105  NA 139 138  K 3.9 3.8  CL 109 108  CO2 22 22  GLUCOSE 123* 127*  BUN 11 15  CREATININE 0.59 0.53  CALCIUM  8.2* 8.4*   No results for input(s): LABPROT, INR in the last 72 hours.    Component Value Date/Time   NA 138 04/19/2024 0105   NA 143 12/07/2019 0751   K 3.8 04/19/2024 0105   CL 108 04/19/2024 0105   CO2 22 04/19/2024 0105   GLUCOSE 127 (H) 04/19/2024 0105   BUN 15 04/19/2024 0105   BUN 15 12/07/2019 0751   CREATININE 0.53 04/19/2024 0105   CREATININE 0.79 12/31/2023 1027   CALCIUM  8.4 (L) 04/19/2024 0105   PROT 5.5 (L) 04/18/2024 0207   PROT 6.4 09/07/2020 1311   ALBUMIN  2.7 (L) 04/18/2024 0207   ALBUMIN  3.9 09/07/2020 1311   AST 13 (L) 04/18/2024 0207   ALT 13 04/18/2024 0207   ALKPHOS 64 04/18/2024 0207   BILITOT <0.2 04/18/2024 0207   BILITOT <0.2 09/07/2020 1311   GFRNONAA >60 04/19/2024 0105   GFRAA >60 01/03/2020 1357    Assessment/Plan Ileostomy reversal s/p revision with leak FEN - clear liquids, TPN VTE - lovenox  ID - [email or web address]  Disposition - inpatient   LOS: 4 days   I reviewed last 24 h vitals and pain scores, last 48 h intake and output, last 24 h labs and trends, and last 24 h imaging  results.  This care required moderate level of medical decision making.   [name] [facility] Surgery at [facility] 04/19/2024, 10:33 AM Please see Amion for pager number during day hours 7:00am-4:30pm or 7:00am -11:30am on weekends

## 2024-04-19 NOTE — Progress Notes (Signed)
 PHARMACY - TOTAL PARENTERAL NUTRITION CONSULT NOTE   Indication: EC fistula  Patient Measurements: Height: 5' 3 (160 cm) Weight: 62.7 kg (138 lb 3.7 oz) IBW/kg (Calculated) : 52.4 TPN AdjBW (KG): 58.5 Body mass index is 24.49 kg/m. Usual Weight:   Assessment: Presented to ED 10/9 with with abdominal pain and feculent drainage from prior ileostomy site. See recent surgical history below. Previously required TPN and was last discharged home 04/12/24. Now with Feculent appearing drainage from ileostomy site with concern for recurrent leak.   Glucose / Insulin: no h/o DM.  - CBGs 117-168, 7 units SSI given Electrolytes: Lytes WNL  Renal: Scr <1, BUN 15 Hepatic: LFT's WNL, Albumin  2.7, TG 116 (10/13) Intake / Output - Output:  stool 150 mL, Urine x7 occurrences - NPO, no mIVF  GI Imaging: - 9/28: CT and/pelvis: Postoperative changes from colostomy reversal. Small fluid collection noted in the anterior abdomen at the prior ostomy level. Dilated small bowel loops into the pelvis. Terminal ileum is decompressed. Cannot exclude distal small bowel obstruction. - 10/3 abd xray: Several air-filled mildly dilated small bowel loops in the central Abdomen. 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 Cl:Ac 1:1 Add standard MVI and trace elements to TPN Continue moderate SSI q6h and adjust as needed  Monitor TPN labs on Mon/Thurs, PRN   Wanda Hasting PharmD, BCPS [facility] main pharmacy [phone] 04/19/2024 8:27 AM

## 2024-04-20 ENCOUNTER — Inpatient Hospital Stay (HOSPITAL_COMMUNITY)

## 2024-04-20 LAB — BASIC METABOLIC PANEL WITH GFR
Anion gap: 9 (ref 5–15)
BUN: 16 mg/dL (ref 8–23)
CO2: 22 mmol/L (ref 22–32)
Calcium: 7.8 mg/dL — ABNORMAL LOW (ref 8.9–10.3)
Chloride: 109 mmol/L (ref 98–111)
Creatinine, Ser: 0.63 mg/dL (ref 0.44–1.00)
GFR, Estimated: >60 mL/min (ref >60)
Glucose, Bld: 117 mg/dL — ABNORMAL HIGH (ref 70–99)
Potassium: 3.7 mmol/L (ref 3.5–5.1)
Sodium: 139 mmol/L (ref 135–145)

## 2024-04-20 LAB — CULTURE, BLOOD (ROUTINE X 2)
Culture: NO GROWTH
Culture: NO GROWTH
Special Requests: ADEQUATE
Special Requests: ADEQUATE

## 2024-04-20 LAB — GLUCOSE, CAPILLARY
Glucose-Capillary: 116 mg/dL — ABNORMAL HIGH (ref 70–99)
Glucose-Capillary: 122 mg/dL — ABNORMAL HIGH (ref 70–99)
Glucose-Capillary: 127 mg/dL — ABNORMAL HIGH (ref 70–99)
Glucose-Capillary: 129 mg/dL — ABNORMAL HIGH (ref 70–99)

## 2024-04-20 LAB — CBC
HCT: 31.2 % — ABNORMAL LOW (ref 36.0–46.0)
Hemoglobin: 9.3 g/dL — ABNORMAL LOW (ref 12.0–15.0)
MCH: 27.3 pg (ref 26.0–34.0)
MCHC: 29.8 g/dL — ABNORMAL LOW (ref 30.0–36.0)
MCV: 91.5 fL (ref 80.0–100.0)
Platelets: 823 K/uL — ABNORMAL HIGH (ref 150–400)
RBC: 3.41 MIL/uL — ABNORMAL LOW (ref 3.87–5.11)
RDW: 15.1 % (ref 11.5–15.5)
WBC: 11.5 K/uL — ABNORMAL HIGH (ref 4.0–10.5)
nRBC: 0 % (ref 0.0–0.2)

## 2024-04-20 MED ORDER — IOHEXOL 9 MG/ML PO SOLN
1000.0000 mL | ORAL | Status: AC
  Administered 2024-04-20: 1000 mL via ORAL

## 2024-04-20 MED ORDER — IOHEXOL 300 MG/ML  SOLN
100.0000 mL | Freq: Once | INTRAMUSCULAR | Status: AC | PRN
  Administered 2024-04-20: 100 mL via INTRAVENOUS

## 2024-04-20 MED ORDER — TRAVASOL 10 % IV SOLN
INTRAVENOUS | Status: AC
  Filled 2024-04-20: qty 960

## 2024-04-20 NOTE — Progress Notes (Signed)
      Chief Complaint/Subjective: More diarrhea, less out ostomy site, no pain  Objective: Vital signs in last 24 hours: Temp:  [98.4 F (36.9 C)-98.8 F (37.1 C)] 98.4 F (36.9 C) (10/15 0600) Pulse Rate:  [70-72] 70 (10/15 0600) Resp:  [16-17] 17 (10/15 0600) BP: (130-139)/(52-57) 130/57 (10/15 0600) SpO2:  [100 %] 100 % (10/15 0600) Last BM Date : 04/19/24 Intake/Output from previous day: 10/14 0701 - 10/15 0700 In: 1339.1 [P.O.:120; I.V.:1065.7; IV Piggyback:153.5] Out: 100 [Stool:100]  PE: Gen: NAD Resp: nonlabored Card: RRR Abd: incision open with healthy granulation tissue, right ostomy with small volume succus  Lab Results:  Recent Labs    04/20/24 0823  WBC 11.5*  HGB 9.3*  HCT 31.2*  PLT 823*   Recent Labs    04/18/24 0207 04/19/24 0105  NA 139 138  K 3.9 3.8  CL 109 108  CO2 22 22  GLUCOSE 123* 127*  BUN 11 15  CREATININE 0.59 0.53  CALCIUM  8.2* 8.4*   No results for input(s): LABPROT, INR in the last 72 hours.    Component Value Date/Time   NA 138 04/19/2024 0105   NA 143 12/07/2019 0751   K 3.8 04/19/2024 0105   CL 108 04/19/2024 0105   CO2 22 04/19/2024 0105   GLUCOSE 127 (H) 04/19/2024 0105   BUN 15 04/19/2024 0105   BUN 15 12/07/2019 0751   CREATININE 0.53 04/19/2024 0105   CREATININE 0.79 12/31/2023 1027   CALCIUM  8.4 (L) 04/19/2024 0105   PROT 5.5 (L) 04/18/2024 0207   PROT 6.4 09/07/2020 1311   ALBUMIN  2.7 (L) 04/18/2024 0207   ALBUMIN  3.9 09/07/2020 1311   AST 13 (L) 04/18/2024 0207   ALT 13 04/18/2024 0207   ALKPHOS 64 04/18/2024 0207   BILITOT <0.2 04/18/2024 0207   BILITOT <0.2 09/07/2020 1311   GFRNONAA >60 04/19/2024 0105   GFRAA >60 01/03/2020 1357    Assessment/Plan Ileostomy reversal s/p revision with leak    -follow up CT scan today  FEN - clear liquids, TPN VTE - lovenox  ID - [email or web address] , will end 10/16 Disposition - inpatient, home with TPN, complex wound care   LOS: 5 days   I reviewed last 24 h  vitals and pain scores, last 48 h intake and output, last 24 h labs and trends, and last 24 h imaging results.  This care required moderate level of medical decision making.   [name] [facility] Surgery at [facility] 04/20/2024, 8:44 AM Please see Amion for pager number during day hours 7:00am-4:30pm or 7:00am -11:30am on weekends

## 2024-04-20 NOTE — Progress Notes (Signed)
 PHARMACY - TOTAL PARENTERAL NUTRITION CONSULT NOTE   Indication: EC fistula  Patient Measurements: Height: 5' 3 (160 cm) Weight: 62.7 kg (138 lb 3.7 oz) IBW/kg (Calculated) : 52.4 TPN AdjBW (KG): 58.5 Body mass index is 24.49 kg/m. Usual Weight:   Assessment: Presented to ED 10/9 with with abdominal pain and feculent drainage from prior ileostomy site. See recent surgical history below. Previously required TPN and was last discharged home 04/12/24. Now with Feculent appearing drainage from ileostomy site with concern for recurrent leak.   Glucose / Insulin: no h/o DM.  - CBGs 122-133, 8 units SSI given Electrolytes: Lytes WNL, except CorrCa 8.84 Renal: Scr <1, BUN 16 Hepatic: LFT's WNL, Albumin  2.7, TG 116 (10/13) Intake / Output - Output:  stool 100 mL, stool x13 occurrences, urine x12 occurrences - PO intake 120 mL, no mIVF  GI Imaging: - 9/28: CT and/pelvis: Postoperative changes from colostomy reversal. Small fluid collection noted in the anterior abdomen at the prior ostomy level. Dilated small bowel loops into the pelvis. Terminal ileum is decompressed. Cannot exclude distal small bowel obstruction. - 10/3 abd xray: Several air-filled mildly dilated small bowel loops in the central Abdomen. 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 9/23: S/p loop ileostomy takedown  9/29: S/P ileocecectomy for anastomotic leak   Central access: PICC 10/11 TPN start date: Restart 04/16/24 - Previous TPN 10/3-10/7 ( then discharged home)  Nutritional Goals: Goal TPN rate is 80 mL/hr (provides 96 g of protein and  1993 kcals per day)   RD Assessment: Estimated Needs Total Energy Estimated Needs: 1900-2100 Total Protein Estimated Needs: 90-105g Total Fluid Estimated Needs: 2L/day  Current Nutrition:  Clear liquids and TPN  Plan:  Continue TPN at 80 mL/hr at 1800 Electrolytes in TPN: Na 81mEq/L, K 82mEq/L, Ca 40mEq/L, Mg 68mEq/L, and Phos 20mmol/L. Cl:Ac 1:1 Add standard MVI and trace elements to TPN Continue moderate SSI q6h and adjust as needed  Monitor TPN labs on Mon/Thurs, PRN   Wanda Hasting PharmD, BCPS [facility] main pharmacy [phone] 04/20/2024 9:52 AM

## 2024-04-20 NOTE — Plan of Care (Signed)
   Problem: Education: Goal: Knowledge of General Education information will improve Description Including pain rating scale, medication(s)/side effects and non-pharmacologic comfort measures Outcome: Progressing   Problem: Health Behavior/Discharge Planning: Goal: Ability to manage health-related needs will improve Outcome: Progressing

## 2024-04-20 NOTE — TOC Progression Note (Signed)
 Transition of Care [facility]) - Progression Note    Patient Details  Name: Olivia [name] MRN: [id number] Date of Birth: March 11, 1956  Transition of Care [facility]) CM/SW Contact  Alfonse JONELLE Rex, RN Phone Number: 04/20/2024, 11:21 AM  Clinical Narrative:  Call to [facility] at [phone], sw Amy, informed patient readmitted and consult received for [name] and TPN, referral accepted. Amy states will need H&P, resumption of care orders, HH orders and DC summary faxed to [phone] at discharge. TOC will continue to follow.       Expected Discharge Plan: Home w Home Health Services Barriers to Discharge: Continued Medical Work up               Expected Discharge Plan and Services In-house Referral: Clinical Social Work Discharge Planning Services: NA Post Acute Care Choice: Home Health Living arrangements for the past 2 months: Single Family Home                 DME Arranged: N/A         HH Arranged: RN HH Agency: Other - See comment ([facility]) Date HH Agency Contacted: 04/18/24 Time HH Agency Contacted: 1012     Social Drivers of Health (SDOH) Interventions SDOH Screenings   Food Insecurity: No Food Insecurity (04/15/2024)  Housing: Low Risk  (04/15/2024)  Transportation Needs: No Transportation Needs (04/15/2024)  Utilities: Not At Risk (04/15/2024)  Depression (PHQ2-9): Low Risk  (12/31/2023)  Social Connections: Moderately Integrated (04/15/2024)  Tobacco Use: Medium Risk (04/14/2024)    Readmission Risk Interventions    04/18/2024   10:10 AM 04/15/2024    1:23 PM 04/04/2024    9:35 AM  Readmission Risk Prevention Plan  Transportation Screening Complete Complete Complete  PCP or Specialist Appt within 3-5 Days Complete Complete Complete  HRI or Home Care Consult Complete Complete Complete  Social Work Consult for Recovery Care Planning/Counseling Complete Complete Complete  Palliative Care Screening Not Applicable Not  Applicable Complete  Medication Review Oceanographer) Complete Complete Complete

## 2024-04-21 DIAGNOSIS — E440 Moderate protein-calorie malnutrition: Secondary | ICD-10-CM | POA: Insufficient documentation

## 2024-04-21 LAB — CBC
HCT: 32.1 % — ABNORMAL LOW (ref 36.0–46.0)
Hemoglobin: 9.5 g/dL — ABNORMAL LOW (ref 12.0–15.0)
MCH: 27.1 pg (ref 26.0–34.0)
MCHC: 29.6 g/dL — ABNORMAL LOW (ref 30.0–36.0)
MCV: 91.7 fL (ref 80.0–100.0)
Platelets: 778 K/uL — ABNORMAL HIGH (ref 150–400)
RBC: 3.5 MIL/uL — ABNORMAL LOW (ref 3.87–5.11)
RDW: 15.1 % (ref 11.5–15.5)
WBC: 12.9 K/uL — ABNORMAL HIGH (ref 4.0–10.5)
nRBC: 0 % (ref 0.0–0.2)

## 2024-04-21 LAB — GLUCOSE, CAPILLARY
Glucose-Capillary: 136 mg/dL — ABNORMAL HIGH (ref 70–99)
Glucose-Capillary: 137 mg/dL — ABNORMAL HIGH (ref 70–99)
Glucose-Capillary: 149 mg/dL — ABNORMAL HIGH (ref 70–99)
Glucose-Capillary: 121 mg/dL — ABNORMAL HIGH (ref 70–99)

## 2024-04-21 LAB — COMPREHENSIVE METABOLIC PANEL WITH GFR
ALT: 11 U/L (ref 0–44)
AST: 14 U/L — ABNORMAL LOW (ref 15–41)
Albumin: 2.8 g/dL — ABNORMAL LOW (ref 3.5–5.0)
Alkaline Phosphatase: 58 U/L (ref 38–126)
Anion gap: 11 (ref 5–15)
BUN: 15 mg/dL (ref 8–23)
CO2: 20 mmol/L — ABNORMAL LOW (ref 22–32)
Calcium: 8.6 mg/dL — ABNORMAL LOW (ref 8.9–10.3)
Chloride: 109 mmol/L (ref 98–111)
Creatinine, Ser: 0.56 mg/dL (ref 0.44–1.00)
GFR, Estimated: >60 mL/min (ref >60)
Glucose, Bld: 114 mg/dL — ABNORMAL HIGH (ref 70–99)
Potassium: 3.7 mmol/L (ref 3.5–5.1)
Sodium: 139 mmol/L (ref 135–145)
Total Bilirubin: 0.2 mg/dL (ref 0.0–1.2)
Total Protein: 5.7 g/dL — ABNORMAL LOW (ref 6.5–8.1)

## 2024-04-21 LAB — MAGNESIUM: Magnesium: 2 mg/dL (ref 1.7–2.4)

## 2024-04-21 LAB — PHOSPHORUS: Phosphorus: 2.8 mg/dL (ref 2.5–4.6)

## 2024-04-21 MED ORDER — TRAVASOL 10 % IV SOLN
INTRAVENOUS | Status: AC
  Filled 2024-04-21: qty 960

## 2024-04-21 NOTE — Progress Notes (Signed)
 PHARMACY - TOTAL PARENTERAL NUTRITION CONSULT NOTE   Indication: EC fistula  Patient Measurements: Height: 5' 3 (160 cm) Weight: 62.7 kg (138 lb 3.7 oz) IBW/kg (Calculated) : 52.4 TPN AdjBW (KG): 58.5 Body mass index is 24.49 kg/m. Usual Weight:   Assessment: Presented to ED 10/9 with with abdominal pain and feculent drainage from prior ileostomy site. See recent surgical history below. Previously required TPN and was last discharged home 04/12/24. Now with Feculent appearing drainage from ileostomy site with concern for recurrent leak.   Glucose / Insulin: no h/o DM.  - CBGs 116-129, 6 units SSI given/24hr Electrolytes: CO2 down to 20, other lytes WNL, including CorrCa improved to 9.6 Renal: Scr <1, BUN stable at 15 Hepatic: LFT's WNL, Albumin  2.8, TG 116 (10/13) Intake / Output - Output:  stool 350 mL, stool x5 occurrences, urine x10 occurrences - PO intake 720 mL, no mIVF  GI Imaging: - 9/28: CT and/pelvis: Postoperative changes from colostomy reversal. Small fluid collection noted in the anterior abdomen at the prior ostomy level. Dilated small bowel loops into the pelvis. Terminal ileum is decompressed. Cannot exclude distal small bowel obstruction. - 10/3 abd xray: Several air-filled mildly dilated small bowel loops in the central Abdomen. 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 9/23: S/p loop ileostomy takedown  9/29: S/P ileocecectomy for anastomotic leak   Central access: PICC 10/11 TPN start date: Restart  04/16/24 - Previous TPN 10/3-10/7 ( then discharged home)  Nutritional Goals: Goal TPN rate is 80 mL/hr (provides 96 g of protein and 1993 kcals per day)   RD Assessment: Estimated Needs Total Energy Estimated Needs: 1900-2100 Total Protein Estimated Needs: 90-105g Total Fluid Estimated Needs: 2L/day  Current Nutrition:  Clear liquids and TPN  Plan:  Continue TPN at 80 mL/hr at 1800 Electrolytes in TPN: (increased acetate) Na 50mEq/L, K 60mEq/L, Ca 24mEq/L, Mg 38mEq/L, and Phos 20mmol/L. Cl:Ac 1:2 Add standard MVI and trace elements to TPN Continue moderate SSI q6h and adjust as needed  Monitor TPN labs on Mon/Thurs, PRN  Thank you for allowing pharmacy to be a part of this patient's care.  Marget Hench, PharmD Clinical Pharmacist 10/16/20257:39 AM

## 2024-04-21 NOTE — Plan of Care (Signed)

## 2024-04-21 NOTE — Progress Notes (Signed)
      Chief Complaint/Subjective: Pain improved, diarrhea lessened overnight, able to get to bedside commode  Objective: Vital signs in last 24 hours: Temp:  [98.4 F (36.9 C)-98.9 F (37.2 C)] 98.9 F (37.2 C) (10/16 0545) Pulse Rate:  [69-71] 71 (10/16 0545) Resp:  [16-18] 18 (10/16 0545) BP: (111-130)/(42-65) 111/56 (10/16 0545) SpO2:  [99 %-100 %] 99 % (10/16 0545) Last BM Date : 04/20/24 Intake/Output from previous day: 10/15 0701 - 10/16 0700 In: 1686.6 [P.O.:720; I.V.:884.7; IV Piggyback:81.9] Out: 350 [Stool:350]  PE: Gen: NAD Resp: nonlabored Card: RRR Abd: soft, bag with 50 ml of succus  Lab Results:  Recent Labs    04/20/24 0823 04/21/24 0038  WBC 11.5* 12.9*  HGB 9.3* 9.5*  HCT 31.2* 32.1*  PLT 823* 778*   Recent Labs    04/20/24 0823 04/21/24 0038  NA 139 139  K 3.7 3.7  CL 109 109  CO2 22 20*  GLUCOSE 117* 114*  BUN 16 15  CREATININE 0.63 0.56  CALCIUM  7.8* 8.6*   No results for input(s): LABPROT, INR in the last 72 hours.    Component Value Date/Time   NA 139 04/21/2024 0038   NA 143 12/07/2019 0751   K 3.7 04/21/2024 0038   CL 109 04/21/2024 0038   CO2 20 (L) 04/21/2024 0038   GLUCOSE 114 (H) 04/21/2024 0038   BUN 15 04/21/2024 0038   BUN 15 12/07/2019 0751   CREATININE 0.56 04/21/2024 0038   CREATININE 0.79 12/31/2023 1027   CALCIUM  8.6 (L) 04/21/2024 0038   PROT 5.7 (L) 04/21/2024 0038   PROT 6.4 09/07/2020 1311   ALBUMIN  2.8 (L) 04/21/2024 0038   ALBUMIN  3.9 09/07/2020 1311   AST 14 (L) 04/21/2024 0038   ALT 11 04/21/2024 0038   ALKPHOS 58 04/21/2024 0038   BILITOT 0.2 04/21/2024 0038   BILITOT <0.2 09/07/2020 1311   GFRNONAA >60 04/21/2024 0038   GFRAA >60 01/03/2020 1357    Assessment/Plan Ileostomy reversal s/p revision with leak    -CT 10/15 stable without abscess development   FEN - clear liquids, TPN VTE - lovenox  ID - [email or web address]  stopped today, Wbc 12.9 from 11.5, recheck tomorrow Disposition - inpatient, home  with TPN, complex wound care   LOS: 6 days   I reviewed last 24 h vitals and pain scores, last 48 h intake and output, last 24 h labs and trends, and last 24 h imaging results.  This care required moderate level of medical decision making.   Olivia [name] [facility] Surgery at [facility] 04/21/2024, 8:44 AM Please see Amion for pager number during day hours 7:00am-4:30pm or 7:00am -11:30am on weekends

## 2024-04-22 ENCOUNTER — Inpatient Hospital Stay (HOSPITAL_COMMUNITY)

## 2024-04-22 LAB — CBC
HCT: 33.8 % — ABNORMAL LOW (ref 36.0–46.0)
Hemoglobin: 9.8 g/dL — ABNORMAL LOW (ref 12.0–15.0)
MCH: 26.9 pg (ref 26.0–34.0)
MCHC: 29 g/dL — ABNORMAL LOW (ref 30.0–36.0)
MCV: 92.9 fL (ref 80.0–100.0)
Platelets: 686 K/uL — ABNORMAL HIGH (ref 150–400)
RBC: 3.64 MIL/uL — ABNORMAL LOW (ref 3.87–5.11)
RDW: 15 % (ref 11.5–15.5)
WBC: 16.2 K/uL — ABNORMAL HIGH (ref 4.0–10.5)
nRBC: 0 % (ref 0.0–0.2)

## 2024-04-22 LAB — URINALYSIS, ROUTINE W REFLEX MICROSCOPIC
Bilirubin Urine: NEGATIVE
Glucose, UA: NEGATIVE mg/dL
Hgb urine dipstick: NEGATIVE
Ketones, ur: NEGATIVE mg/dL
Leukocytes,Ua: NEGATIVE
Nitrite: NEGATIVE
Protein, ur: NEGATIVE mg/dL
Specific Gravity, Urine: 1.017 (ref 1.005–1.030)
pH: 5 (ref 5.0–8.0)

## 2024-04-22 LAB — BASIC METABOLIC PANEL WITH GFR
Anion gap: 13 (ref 5–15)
BUN: 14 mg/dL (ref 8–23)
CO2: 17 mmol/L — ABNORMAL LOW (ref 22–32)
Calcium: 8.9 mg/dL (ref 8.9–10.3)
Chloride: 108 mmol/L (ref 98–111)
Creatinine, Ser: 0.56 mg/dL (ref 0.44–1.00)
GFR, Estimated: >60 mL/min (ref >60)
Glucose, Bld: 92 mg/dL (ref 70–99)
Potassium: 3.9 mmol/L (ref 3.5–5.1)
Sodium: 138 mmol/L (ref 135–145)

## 2024-04-22 LAB — GLUCOSE, CAPILLARY
Glucose-Capillary: 115 mg/dL — ABNORMAL HIGH (ref 70–99)
Glucose-Capillary: 120 mg/dL — ABNORMAL HIGH (ref 70–99)
Glucose-Capillary: 130 mg/dL — ABNORMAL HIGH (ref 70–99)
Glucose-Capillary: 135 mg/dL — ABNORMAL HIGH (ref 70–99)
Glucose-Capillary: 137 mg/dL — ABNORMAL HIGH (ref 70–99)

## 2024-04-22 MED ORDER — PIPERACILLIN-TAZOBACTAM 3.375 G IVPB
3.3750 g | Freq: Three times a day (TID) | INTRAVENOUS | Status: DC
Start: 2024-04-23 — End: 2024-04-26
  Administered 2024-04-23 – 2024-04-26 (×11): 3.375 g via INTRAVENOUS
  Filled 2024-04-22 (×11): qty 50

## 2024-04-22 MED ORDER — PIPERACILLIN-TAZOBACTAM 3.375 G IVPB
3.3750 g | Freq: Three times a day (TID) | INTRAVENOUS | Status: DC
  Administered 2024-04-22 (×2): 3.375 g via INTRAVENOUS
  Filled 2024-04-22 (×2): qty 50

## 2024-04-22 MED ORDER — JUVEN PO PACK
1.0000 | PACK | Freq: Two times a day (BID) | ORAL | Status: DC
  Administered 2024-04-22 – 2024-04-26 (×8): 1 via ORAL
  Filled 2024-04-22 (×8): qty 1

## 2024-04-22 MED ORDER — BOOST / RESOURCE BREEZE PO LIQD CUSTOM
1.0000 | Freq: Three times a day (TID) | ORAL | Status: DC
  Administered 2024-04-23 – 2024-04-24 (×4): 1 via ORAL

## 2024-04-22 MED ORDER — TRAVASOL 10 % IV SOLN
INTRAVENOUS | Status: AC
  Filled 2024-04-22: qty 960

## 2024-04-22 NOTE — Progress Notes (Signed)
 Nutrition Follow-up  DOCUMENTATION CODES:   Non-severe (moderate) malnutrition in context of chronic illness  INTERVENTION:   -TPN management per Pharmacy -Daily weights while on TPN  -Boost Breeze po TID, each supplement provides 250 kcal and 9 grams of protein   -1 packet Juven BID, each packet provides 95 calories, 2.5 grams of protein (collagen), to support wound healing   NUTRITION DIAGNOSIS:   Moderate Malnutrition related to chronic illness, altered GI function as evidenced by mild fat depletion, mild muscle depletion, percent weight loss.  Ongoing.  GOAL:   Patient will meet greater than or equal to 90% of their needs  Meeting with TPN  MONITOR:   Labs (TPN), PO intake  ASSESSMENT:   68 year old female withComplex recent surgical history of diverticulitis s/p robotic assisted sigmoid colectomy, LOA, drainage of intra-abdominal abscess, takedown of colovesical fistula, repair of bladder and creation of loop ileostomy on 01/12/24 for diverticulitis with colovesical fistula. She underwent takedown of loop ileostomy on 9/23 after recovering from initial surgery but developed early post-operative leak at this site. She was taken back to the OR 9/29 for exploratory laparotomy and ileocecectomy, she required additional resection of small bowel for this secondary to significant scar tissue in small bowel. Post-operative course complicated by ileus and she required TPN. She was discharged home on 10/7.  Admitted 10/9 with abdominal pain and feculent drainage from prior ileostomy site.  10/9: admitted 10/11: TPN resumed 10/15: CLD started  Patient now on clear liquids. Consumed 100% of hot tea with apple juice this morning.  Surgery now concerned for fistula. Adding Boost Breeze and Juven if patient able to take in clears. Not accepting yet.   TPN to continue at goal rate of 80 ml/hr, providing 1993 kcals and 96g protein.  Medications reviewed.  Labs reviewed: CBGs:  120-137   Diet Order:   Diet Order             Diet clear liquid Fluid consistency: Thin  Diet effective now                   EDUCATION NEEDS:   Education needs have been addressed  Skin:  Skin Integrity Issues:: Other (Comment) Other: 10/10 mid-abdominal wound  Last BM:  10/16 -type 7  Height:   Ht Readings from Last 1 Encounters:  04/15/24 5' 3 (1.6 m)    Weight:   Wt Readings from Last 1 Encounters:  04/17/24 62.7 kg    BMI:  Body mass index is 24.49 kg/m.  Estimated Nutritional Needs:   Kcal:  1900-2100  Protein:  95-110g  Fluid:  2.1L/day   Morna Lee, MS, RD, LDN Inpatient Clinical Dietitian Contact via Secure chat

## 2024-04-22 NOTE — Progress Notes (Signed)
      Chief Complaint/Subjective: Diarrhea overnight, tolerating clear liquids, no new cough  Objective: Vital signs in last 24 hours: Temp:  [98.1 F (36.7 C)-98.9 F (37.2 C)] 98.9 F (37.2 C) (10/17 0557) Pulse Rate:  [73-78] 75 (10/17 0557) Resp:  [16-17] 17 (10/17 0557) BP: (129-150)/(46-59) 132/46 (10/17 0557) SpO2:  [99 %-100 %] 100 % (10/17 0557) Last BM Date : 04/20/24 Intake/Output from previous day: 10/16 0701 - 10/17 0700 In: 2140.6 [P.O.:1200; I.V.:940.6] Out: 100   PE: Gen: NAD Resp: nonlabored Card: RRR Abd: midline wound with some yellow/green drainage from it. Bag with minimal output  Lab Results:  Recent Labs    04/21/24 0038 04/22/24 0324  WBC 12.9* 16.2*  HGB 9.5* 9.8*  HCT 32.1* 33.8*  PLT 778* 686*   Recent Labs    04/21/24 0038 04/22/24 0324  NA 139 138  K 3.7 3.9  CL 109 108  CO2 20* 17*  GLUCOSE 114* 92  BUN 15 14  CREATININE 0.56 0.56  CALCIUM  8.6* 8.9   No results for input(s): LABPROT, INR in the last 72 hours.    Component Value Date/Time   NA 138 04/22/2024 0324   NA 143 12/07/2019 0751   K 3.9 04/22/2024 0324   CL 108 04/22/2024 0324   CO2 17 (L) 04/22/2024 0324   GLUCOSE 92 04/22/2024 0324   BUN 14 04/22/2024 0324   BUN 15 12/07/2019 0751   CREATININE 0.56 04/22/2024 0324   CREATININE 0.79 12/31/2023 1027   CALCIUM  8.9 04/22/2024 0324   PROT 5.7 (L) 04/21/2024 0038   PROT 6.4 09/07/2020 1311   ALBUMIN  2.8 (L) 04/21/2024 0038   ALBUMIN  3.9 09/07/2020 1311   AST 14 (L) 04/21/2024 0038   ALT 11 04/21/2024 0038   ALKPHOS 58 04/21/2024 0038   BILITOT 0.2 04/21/2024 0038   BILITOT <0.2 09/07/2020 1311   GFRNONAA >60 04/22/2024 0324   GFRAA >60 01/03/2020 1357    Assessment/Plan Ileostomy reversal s/p revision with leak  -new change in wound concerning for fistula in midline wound now. Interesting since CT showed no undrained collection. Decreasing measured output now more concerning.  FEN - clear liquids,  TPN VTE - lovenox  ID - restart zosyn  Disposition - inpatient   LOS: 7 days   I reviewed last 24 h vitals and pain scores, last 48 h intake and output, last 24 h labs and trends, and last 24 h imaging results.  This care required moderate level of medical decision making.   [name] [facility] at [facility] 04/22/2024, 7:59 AM Please see Amion for pager number during day hours 7:00am-4:30pm or 7:00am -11:30am on weekends

## 2024-04-22 NOTE — Progress Notes (Signed)
 PHARMACY - TOTAL PARENTERAL NUTRITION CONSULT NOTE   Indication: EC fistula  Patient Measurements: Height: 5' 3 (160 cm) Weight: 62.7 kg (138 lb 3.7 oz) IBW/kg (Calculated) : 52.4 TPN AdjBW (KG): 58.5 Body mass index is 24.49 kg/m. Usual Weight:   Assessment: Presented to ED 10/9 with with abdominal pain and feculent drainage from prior ileostomy site. See recent surgical history below. Previously required TPN and was last discharged home 04/12/24. Now with Feculent appearing drainage from ileostomy site with concern for recurrent leak.   Glucose / Insulin: no h/o DM.  - CBGs 92-149 6 units SSI given/24hr Electrolytes: CO2 down to 17, CorrCa trending up to 9.9 other lytes WNL Renal: Scr <1, BUN stable at 14 Hepatic: 10/16 = LFT's WNL, Albumin  2.8, TG 116 (10/13) Intake / Output - Output:  no stool output recorded yesterday (decreasing with concern for fistula), urine x5 occurrences - PO intake 1200 mL, no mIVF  GI Imaging: - 9/28: CT and/pelvis: Postoperative changes from colostomy reversal. Small fluid collection noted in the anterior abdomen at the prior ostomy level. Dilated small bowel loops into the pelvis. Terminal ileum is decompressed. Cannot exclude distal small bowel obstruction. - 10/3 abd xray: Several air-filled mildly dilated small bowel loops in the central Abdomen. 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 9/23: S/p loop ileostomy takedown  9/29: S/P ileocecectomy for anastomotic leak   Central access: PICC  10/11 TPN start date: Restart 04/16/24 - Previous TPN 10/3-10/7 ( then discharged home)  Nutritional Goals: Goal TPN rate is 80 mL/hr (provides 96 g of protein and 1993 kcals per day)   RD Assessment: Estimated Needs Total Energy Estimated Needs: 1900-2100 Total Protein Estimated Needs: 90-105g Total Fluid Estimated Needs: 2L/day  Current Nutrition:  Clear liquids and TPN  Plan:  Continue TPN at 80 mL/hr at 1800 Electrolytes in TPN:  Na 60mEq/L, K 50mEq/L, Ca 37mEq/L, Mg 99mEq/L, and Phos 20mmol/L. Cl: Ac (maximized) Add standard MVI and trace elements to TPN Continue moderate SSI q6h and adjust as needed  Monitor TPN labs on Mon/Thurs, PRN  Thank you for allowing pharmacy to be a part of this patient's care.  Marget Hench, PharmD Clinical Pharmacist 10/17/20257:11 AM

## 2024-04-22 NOTE — Plan of Care (Signed)
   Problem: Clinical Measurements: Goal: Diagnostic test results will improve Outcome: Progressing

## 2024-04-23 LAB — CBC
HCT: 30 % — ABNORMAL LOW (ref 36.0–46.0)
Hemoglobin: 9.2 g/dL — ABNORMAL LOW (ref 12.0–15.0)
MCH: 27.6 pg (ref 26.0–34.0)
MCHC: 30.7 g/dL (ref 30.0–36.0)
MCV: 90.1 fL (ref 80.0–100.0)
Platelets: 610 K/uL — ABNORMAL HIGH (ref 150–400)
RBC: 3.33 MIL/uL — ABNORMAL LOW (ref 3.87–5.11)
RDW: 15 % (ref 11.5–15.5)
WBC: 15.6 K/uL — ABNORMAL HIGH (ref 4.0–10.5)
nRBC: 0 % (ref 0.0–0.2)

## 2024-04-23 LAB — BASIC METABOLIC PANEL WITH GFR
Anion gap: 9 (ref 5–15)
BUN: 20 mg/dL (ref 8–23)
CO2: 24 mmol/L (ref 22–32)
Calcium: 8.5 mg/dL — ABNORMAL LOW (ref 8.9–10.3)
Chloride: 105 mmol/L (ref 98–111)
Creatinine, Ser: 0.53 mg/dL (ref 0.44–1.00)
GFR, Estimated: >60 mL/min (ref >60)
Glucose, Bld: 107 mg/dL — ABNORMAL HIGH (ref 70–99)
Potassium: 3.9 mmol/L (ref 3.5–5.1)
Sodium: 138 mmol/L (ref 135–145)

## 2024-04-23 LAB — GLUCOSE, CAPILLARY
Glucose-Capillary: 113 mg/dL — ABNORMAL HIGH (ref 70–99)
Glucose-Capillary: 141 mg/dL — ABNORMAL HIGH (ref 70–99)
Glucose-Capillary: 130 mg/dL — ABNORMAL HIGH (ref 70–99)

## 2024-04-23 MED ORDER — TRAVASOL 10 % IV SOLN
INTRAVENOUS | Status: AC
  Filled 2024-04-23: qty 960

## 2024-04-23 NOTE — Progress Notes (Signed)
 PHARMACY - TOTAL PARENTERAL NUTRITION CONSULT NOTE   Indication: EC fistula  Patient Measurements: Height: 5' 3 (160 cm) Weight: 62.7 kg (138 lb 3.7 oz) IBW/kg (Calculated) : 52.4 TPN AdjBW (KG): 58.5 Body mass index is 24.49 kg/m. Usual Weight:   Assessment: Presented to ED 10/9 with with abdominal pain and feculent drainage from prior ileostomy site. See recent surgical history below. Previously required TPN and was last discharged home 04/12/24. Now with Feculent appearing drainage from ileostomy site with concern for recurrent leak.   Glucose / Insulin: no h/o DM.  - CBGs 92-137 4 units SSI given/24hr Electrolytes: CO2 now WNL, other lytes WNL Renal: Scr <1, BUN stable at 20 Hepatic: 10/16 = LFT's WNL, Albumin  2.8, TG 116 (10/13) Intake / Output - Output:  stool x 9 occurrences (diarrhea overnight), urine x 11 occurrences, 1x emesis - PO intake 2280 mL, no mIVF  GI Imaging: - 9/28: CT and/pelvis: Postoperative changes from colostomy reversal. Small fluid collection noted in the anterior abdomen at the prior ostomy level. Dilated small bowel loops into the pelvis. Terminal ileum is decompressed. Cannot exclude distal small bowel obstruction. - 10/3 abd xray: Several air-filled mildly dilated small bowel loops in the central Abdomen. 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 9/23: S/p loop ileostomy takedown  9/29: S/P ileocecectomy for anastomotic leak   Central access: PICC 10/11 TPN start date: Restart 04/16/24 - Previous  TPN 10/3-10/7 ( then discharged home)  Nutritional Goals: Goal TPN rate is 80 mL/hr (provides 96 g of protein and 1993 kcals per day)   RD Assessment: Estimated Needs Total Energy Estimated Needs: 1900-2100 Total Protein Estimated Needs: 90-105g Total Fluid Estimated Needs: 2L/day  Current Nutrition:  Clear liquids and TPN  Plan:  Continue TPN at 80 mL/hr at 1800 Electrolytes in TPN:  Na 17mEq/L, K 50mEq/L, Ca 39mEq/L, Mg 58mEq/L, and Phos 20mmol/L. Cl: Ac (maximized) Add standard MVI and trace elements to TPN Continue moderate SSI q6h and adjust as needed  Monitor TPN labs on Mon/Thurs, PRN  Thank you for allowing pharmacy to be a part of this patient's care.  Eleanor EMERSON Agent, PharmD, BCPS Clinical Pharmacist [place] 04/23/2024 11:10 AM

## 2024-04-23 NOTE — Plan of Care (Signed)
   Problem: Clinical Measurements: Goal: Diagnostic test results will improve Outcome: Progressing

## 2024-04-23 NOTE — Progress Notes (Signed)
   Subjective/Chief Complaint: Loose stools, tol clears, no complaints   Objective: Vital signs in last 24 hours: Temp:  [98.5 F (36.9 C)-98.8 F (37.1 C)] 98.5 F (36.9 C) (10/18 0557) Pulse Rate:  [71-78] 71 (10/18 0557) Resp:  [18] 18 (10/18 0557) BP: (125-141)/(51-63) 140/63 (10/18 0557) SpO2:  [96 %-100 %] 100 % (10/18 0557) Last BM Date : 04/22/24  Intake/Output from previous day: 10/17 0701 - 10/18 0700 In: 4154.3 [P.O.:2280; I.V.:1789.4; IV Piggyback:84.9] Out: -  Intake/Output this shift: No intake/output data recorded.  Gen: NAD Pulm: effort normal CV regular Abd: midline wound with no drainage seen this am( was just changed apparently). Bag with minimal output, nontender  Lab Results:  Recent Labs    04/22/24 0324 04/23/24 0417  WBC 16.2* 15.6*  HGB 9.8* 9.2*  HCT 33.8* 30.0*  PLT 686* 610*   BMET Recent Labs    04/22/24 0324 04/23/24 0417  NA 138 138  K 3.9 3.9  CL 108 105  CO2 17* 24  GLUCOSE 92 107*  BUN 14 20  CREATININE 0.56 0.53  CALCIUM  8.9 8.5*   PT/INR No results for input(s): LABPROT, INR in the last 72 hours. ABG No results for input(s): PHART, HCO3 in the last 72 hours.  Invalid input(s): PCO2, PO2  Studies/Results: DG CHEST PORT 1 VIEW Result Date: 04/22/2024 CLINICAL DATA:  Leukocytosis. EXAM: PORTABLE CHEST 1 VIEW COMPARISON:  Chest radiograph 04/16/2024 FINDINGS: Right upper extremity PICC tip in the SVC.The cardiomediastinal contours are stable. Aortic atherosclerosis. Subsegmental atelectasis or scarring at the left lung base. Pulmonary vasculature is normal. No consolidation, pleural effusion, or pneumothorax. No acute osseous abnormalities are seen. IMPRESSION: Subsegmental atelectasis or scarring at the left lung base. Electronically Signed   By: Andrea Gasman M.D.   On: 04/22/2024 15:18    Anti-infectives: Anti-infectives (From admission, onward)    Start     Dose/Rate Route Frequency Ordered Stop    04/23/24 0200  piperacillin -tazobactam (ZOSYN ) IVPB 3.375 g        3.375 g 12.5 mL/hr over 240 Minutes Intravenous Every 8 hours 04/22/24 1813     04/22/24 0900  piperacillin -tazobactam (ZOSYN ) IVPB 3.375 g  Status:  Discontinued        3.375 g 12.5 mL/hr over 240 Minutes Intravenous Every 8 hours 04/22/24 0814 04/22/24 1813   04/15/24 0600  piperacillin -tazobactam (ZOSYN ) IVPB 3.375 g  Status:  Discontinued        3.375 g 12.5 mL/hr over 240 Minutes Intravenous Every 8 hours 04/15/24 0510 04/21/24 9177       Assessment/Plan: Ileostomy reversal s/p revision with leak  -fistula, low output, continue current care -follow wbc   FEN - clear liquids, TPN VTE - lovenox  ID - restart zosyn  Disposition - inpatient  Olivia [name] 04/23/2024

## 2024-04-24 LAB — BASIC METABOLIC PANEL WITH GFR
Anion gap: 11 (ref 5–15)
BUN: 21 mg/dL (ref 8–23)
CO2: 25 mmol/L (ref 22–32)
Calcium: 8.5 mg/dL — ABNORMAL LOW (ref 8.9–10.3)
Chloride: 103 mmol/L (ref 98–111)
Creatinine, Ser: 0.64 mg/dL (ref 0.44–1.00)
GFR, Estimated: >60 mL/min (ref >60)
Glucose, Bld: 106 mg/dL — ABNORMAL HIGH (ref 70–99)
Potassium: 3.7 mmol/L (ref 3.5–5.1)
Sodium: 139 mmol/L (ref 135–145)

## 2024-04-24 LAB — GLUCOSE, CAPILLARY
Glucose-Capillary: 105 mg/dL — ABNORMAL HIGH (ref 70–99)
Glucose-Capillary: 114 mg/dL — ABNORMAL HIGH (ref 70–99)
Glucose-Capillary: 118 mg/dL — ABNORMAL HIGH (ref 70–99)
Glucose-Capillary: 127 mg/dL — ABNORMAL HIGH (ref 70–99)
Glucose-Capillary: 130 mg/dL — ABNORMAL HIGH (ref 70–99)

## 2024-04-24 LAB — MAGNESIUM: Magnesium: 2.3 mg/dL (ref 1.7–2.4)

## 2024-04-24 LAB — PHOSPHORUS: Phosphorus: 3.6 mg/dL (ref 2.5–4.6)

## 2024-04-24 MED ORDER — POTASSIUM CHLORIDE 10 MEQ/50ML IV SOLN
10.0000 meq | INTRAVENOUS | Status: AC
  Administered 2024-04-24 (×2): 10 meq via INTRAVENOUS
  Filled 2024-04-24 (×2): qty 50

## 2024-04-24 MED ORDER — LOPERAMIDE HCL 2 MG PO CAPS
2.0000 mg | ORAL_CAPSULE | ORAL | Status: DC | PRN
  Administered 2024-04-25 – 2024-04-26 (×4): 2 mg via ORAL
  Filled 2024-04-24 (×4): qty 1

## 2024-04-24 MED ORDER — POTASSIUM CHLORIDE 10 MEQ/50ML IV SOLN
10.0000 meq | INTRAVENOUS | Status: AC
  Filled 2024-04-24 (×2): qty 50

## 2024-04-24 MED ORDER — TRAVASOL 10 % IV SOLN
INTRAVENOUS | Status: AC
  Filled 2024-04-24: qty 960

## 2024-04-24 MED ORDER — INSULIN ASPART 100 UNIT/ML IJ SOLN
0.0000 [IU] | Freq: Three times a day (TID) | INTRAMUSCULAR | Status: DC
  Administered 2024-04-24 – 2024-04-25 (×2): 2 [IU] via SUBCUTANEOUS

## 2024-04-24 NOTE — Plan of Care (Signed)
   Problem: Clinical Measurements: Goal: Ability to maintain clinical measurements within normal limits will improve Outcome: Progressing

## 2024-04-24 NOTE — Plan of Care (Signed)

## 2024-04-24 NOTE — Progress Notes (Addendum)
   Subjective/Chief Complaint: A little sore near ostomy appliance, but tolerable.  No n/v except when having to take a lot of pills at once.   Complains of loose stools that occur all night long.     Objective: Vital signs in last 24 hours: Temp:  [98.4 F (36.9 C)-98.8 F (37.1 C)] 98.8 F (37.1 C) (10/19 9391) Pulse Rate:  [74-79] 74 (10/19 0608) Resp:  [18] 18 (10/19 9391) BP: (118-132)/(47-52) 118/47 (10/19 0608) SpO2:  [98 %-100 %] 98 % (10/19 0608) Last BM Date : 04/23/24  Intake/Output from previous day: 10/18 0701 - 10/19 0700 In: 2162.9 [P.O.:1300; I.V.:758.6; IV Piggyback:104.3] Out: -  Intake/Output this shift: No intake/output data recorded.  Gen: NAD Pulm: effort normal CV regular Abd: midline wound with no drainage. Ostomy appliance with stool   Lab Results:  Recent Labs    04/22/24 0324 04/23/24 0417  WBC 16.2* 15.6*  HGB 9.8* 9.2*  HCT 33.8* 30.0*  PLT 686* 610*   BMET Recent Labs    04/23/24 0417 04/24/24 0213  NA 138 139  K 3.9 3.7  CL 105 103  CO2 24 25  GLUCOSE 107* 106*  BUN 20 21  CREATININE 0.53 0.64  CALCIUM  8.5* 8.5*   PT/INR No results for input(s): LABPROT, INR in the last 72 hours. ABG No results for input(s): PHART, HCO3 in the last 72 hours.  Invalid input(s): PCO2, PO2  Studies/Results: No results found.   Anti-infectives: Anti-infectives (From admission, onward)    Start     Dose/Rate Route Frequency Ordered Stop   04/23/24 0200  piperacillin -tazobactam (ZOSYN ) IVPB 3.375 g        3.375 g 12.5 mL/hr over 240 Minutes Intravenous Every 8 hours 04/22/24 1813     04/22/24 0900  piperacillin -tazobactam (ZOSYN ) IVPB 3.375 g  Status:  Discontinued        3.375 g 12.5 mL/hr over 240 Minutes Intravenous Every 8 hours 04/22/24 0814 04/22/24 1813   04/15/24 0600  piperacillin -tazobactam (ZOSYN ) IVPB 3.375 g  Status:  Discontinued        3.375 g 12.5 mL/hr over 240 Minutes Intravenous Every 8 hours  04/15/24 0510 04/21/24 9177       Assessment/Plan: Ileostomy reversal s/p revision with leak  -fistula, low output, continue current care -follow wbc   FEN - clear liquids, TPN VTE - lovenox  ID - restart zosyn  Disposition - inpatient Possibly home in next 1-2 days.    Jina LITTIE Nephew, MD, FACS, FSSO Surgical Oncology, General Surgery, Trauma and Critical [facility], [place] [phone] for weekday/non holidays Check [email or web address] for coverage night/weekend/holidays

## 2024-04-24 NOTE — Progress Notes (Signed)
 PHARMACY - TOTAL PARENTERAL NUTRITION CONSULT NOTE   Indication: EC fistula  Patient Measurements: Height: 5' 3 (160 cm) Weight: 62.7 kg (138 lb 3.7 oz) IBW/kg (Calculated) : 52.4 TPN AdjBW (KG): 58.5 Body mass index is 24.49 kg/m. Usual Weight:   Assessment: Presented to ED 10/9 with with abdominal pain and feculent drainage from prior ileostomy site. See recent surgical history below. Previously required TPN and was last discharged home 04/12/24. Now with Feculent appearing drainage from ileostomy site with concern for recurrent leak.   Glucose / Insulin: no h/o DM.  - CBGs 105-141 4 units SSI given/24hr Electrolytes: K low at 3.7, other lytes WNL Renal: Scr <1, BUN WNL; stable Hepatic: 10/16 = LFT's WNL, Albumin  2.8, TG 116 (10/13) Intake / Output - Output:  stool x 7 occurrences, urine x 7 occurrences - PO intake 1300 mL, no mIVF  GI Imaging: - 9/28: CT and/pelvis: Postoperative changes from colostomy reversal. Small fluid collection noted in the anterior abdomen at the prior ostomy level. Dilated small bowel loops into the pelvis. Terminal ileum is decompressed. Cannot exclude distal small bowel obstruction. - 10/3 abd xray: Several air-filled mildly dilated small bowel loops in the central Abdomen. 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 9/23: S/p loop ileostomy takedown  9/29: S/P ileocecectomy for anastomotic leak   Central access: PICC 10/11 TPN start date: Restart 04/16/24 - Previous TPN 10/3-10/7 ( then discharged  home)  Nutritional Goals: Goal TPN rate is 80 mL/hr (provides 96 g of protein and 1993 kcals per day)   RD Assessment: Estimated Needs Total Energy Estimated Needs: 1900-2100 Total Protein Estimated Needs: 90-105g Total Fluid Estimated Needs: 2L/day  Current Nutrition:  Clear liquids and TPN  Plan:  KCl 10 mEq IV q1h x 2  Continue TPN at 80 mL/hr at 1800 Electrolytes in TPN:  Na 65mEq/L, K 57mEq/L-increased, Ca 45mEq/L, Mg 8mEq/L, and Phos 20mmol/L. Cl: Ac (maximized) Add standard MVI and trace elements to TPN Change moderate SSI to q8h and adjust as needed  Monitor TPN labs on Mon/Thurs, PRN   Thank you for allowing pharmacy to be a part of this patient's care.  Eleanor EMERSON Agent, PharmD, BCPS Clinical Pharmacist [place] 04/24/2024 9:47 AM

## 2024-04-25 LAB — CBC
HCT: 31.2 % — ABNORMAL LOW (ref 36.0–46.0)
Hemoglobin: 9.3 g/dL — ABNORMAL LOW (ref 12.0–15.0)
MCH: 26.7 pg (ref 26.0–34.0)
MCHC: 29.8 g/dL — ABNORMAL LOW (ref 30.0–36.0)
MCV: 89.7 fL (ref 80.0–100.0)
Platelets: 637 K/uL — ABNORMAL HIGH (ref 150–400)
RBC: 3.48 MIL/uL — ABNORMAL LOW (ref 3.87–5.11)
RDW: 15.3 % (ref 11.5–15.5)
WBC: 14.8 K/uL — ABNORMAL HIGH (ref 4.0–10.5)
nRBC: 0 % (ref 0.0–0.2)

## 2024-04-25 LAB — COMPREHENSIVE METABOLIC PANEL WITH GFR
ALT: 15 U/L (ref 0–44)
AST: 18 U/L (ref 15–41)
Albumin: 2.8 g/dL — ABNORMAL LOW (ref 3.5–5.0)
Alkaline Phosphatase: 56 U/L (ref 38–126)
Anion gap: 10 (ref 5–15)
BUN: 21 mg/dL (ref 8–23)
CO2: 25 mmol/L (ref 22–32)
Calcium: 8.6 mg/dL — ABNORMAL LOW (ref 8.9–10.3)
Chloride: 103 mmol/L (ref 98–111)
Creatinine, Ser: 0.59 mg/dL (ref 0.44–1.00)
GFR, Estimated: >60 mL/min (ref >60)
Glucose, Bld: 108 mg/dL — ABNORMAL HIGH (ref 70–99)
Potassium: 4 mmol/L (ref 3.5–5.1)
Sodium: 138 mmol/L (ref 135–145)
Total Bilirubin: 0.2 mg/dL (ref 0.0–1.2)
Total Protein: 5.9 g/dL — ABNORMAL LOW (ref 6.5–8.1)

## 2024-04-25 LAB — MAGNESIUM: Magnesium: 2.1 mg/dL (ref 1.7–2.4)

## 2024-04-25 LAB — TRIGLYCERIDES: Triglycerides: 189 mg/dL — ABNORMAL HIGH (ref <150)

## 2024-04-25 LAB — PHOSPHORUS: Phosphorus: 3.6 mg/dL (ref 2.5–4.6)

## 2024-04-25 LAB — GLUCOSE, CAPILLARY
Glucose-Capillary: 115 mg/dL — ABNORMAL HIGH (ref 70–99)
Glucose-Capillary: 121 mg/dL — ABNORMAL HIGH (ref 70–99)

## 2024-04-25 MED ORDER — RIVAROXABAN 10 MG PO TABS
20.0000 mg | ORAL_TABLET | Freq: Every day | ORAL | Status: DC
  Administered 2024-04-25: 20 mg via ORAL
  Filled 2024-04-25: qty 2

## 2024-04-25 MED ORDER — TRAVASOL 10 % IV SOLN
INTRAVENOUS | Status: DC
  Filled 2024-04-25: qty 960

## 2024-04-25 MED ORDER — HEPARIN SOD (PORK) LOCK FLUSH 100 UNIT/ML IV SOLN
250.0000 [IU] | INTRAVENOUS | Status: AC | PRN
  Administered 2024-04-26: 250 [IU]

## 2024-04-25 MED ORDER — LOPERAMIDE HCL 2 MG PO TABS: 2.0000 mg | ORAL_TABLET | Freq: Every day | ORAL | 0 refills | Status: AC

## 2024-04-25 NOTE — Progress Notes (Signed)
 PHARMACY - TOTAL PARENTERAL NUTRITION CONSULT NOTE   Indication: EC fistula  Patient Measurements: Height: 5' 3 (160 cm) Weight: 62.7 kg (138 lb 3.7 oz) IBW/kg (Calculated) : 52.4 TPN AdjBW (KG): 58.5 Body mass index is 24.49 kg/m. Usual Weight:   Assessment: Presented to ED 10/9 with with abdominal pain and feculent drainage from prior ileostomy site. See recent surgical history below. Previously required TPN and was last discharged home 04/12/24. Now with Feculent appearing drainage from ileostomy site with concern for recurrent leak.   Glucose / Insulin: no h/o DM.  - CBGs 115 - 121;  4 units SSI given/24hr Electrolytes: all lytes WNL Renal: Scr <1, BUN WNL; stable Hepatic: 10/20 = LFT's WNL, Albumin  2.8, TG 189 (10/20) Intake / Output - Output:  stool x 3 occurrences ( 1 dose imodium 10/20 am) , urine x 4 occurrences - PO intake 780 mL, no mIVF  GI Imaging: - 9/28: CT and/pelvis: Postoperative changes from colostomy reversal. Small fluid collection noted in the anterior abdomen at the prior ostomy level. Dilated small bowel loops into the pelvis. Terminal ileum is decompressed. Cannot exclude distal small bowel obstruction. - 10/3 abd xray: Several air-filled mildly dilated small bowel loops in the central Abdomen. 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 9/23: S/p loop ileostomy takedown  9/29: S/P ileocecectomy for anastomotic leak   Central access: PICC 10/11 TPN start date: Restart 04/16/24 - Previous TPN 10/3-10/7 (  then discharged home)  Nutritional Goals: Goal TPN rate is 80 mL/hr (provides 96 g of protein and 1993 kcals per day)   RD Assessment: Estimated Needs Total Energy Estimated Needs: 1900-2100 Total Protein Estimated Needs: 90-105g Total Fluid Estimated Needs: 2L/day  Current Nutrition:  Clear liquids and TPN  Plan:  Continue TPN at 80 mL/hr at 1800 Electrolytes in TPN:  Na 4mEq/L, K 50mEq/L Ca 23mEq/L, Mg 34mEq/L, and Phos 15mmol/L. Cl: Ac 1:1 DC SSI & DC CBGs Add standard MVI and trace elements to TPN F/u ability to advance diet & taper off TPN Monitor TPN labs on Mon/Thurs, PRN  Thank you for allowing pharmacy to be a part of this patient's care.  Olivia [name], Pharm.D Use secure chat for questions 04/25/2024 8:06 AM

## 2024-04-25 NOTE — Plan of Care (Signed)
  Problem: Health Behavior/Discharge Planning: Goal: Ability to manage health-related needs will improve 04/25/2024 1943 by Noella Hands, RN Outcome: Progressing 04/25/2024 1940 by Noella Hands, RN Outcome: Progressing 04/25/2024 1939 by Noella Hands, RN Outcome: Progressing

## 2024-04-25 NOTE — Care Management Important Message (Signed)
 Important Message  Patient Details  Name: Olivia [name] MRN: [id number] Date of Birth: 10/19/1955   Important Message Given:        Glade Cuff 04/25/2024, 4:14 PM

## 2024-04-25 NOTE — Plan of Care (Signed)
  Problem: Health Behavior/Discharge Planning: Goal: Ability to manage health-related needs will improve 04/25/2024 1940 by Noella Hands, RN Outcome: Progressing 04/25/2024 1939 by Noella Hands, RN Outcome: Progressing

## 2024-04-25 NOTE — Discharge Summary (Signed)
 Physician Discharge Summary  Olivia [name] FMW:[id number] DOB: Jun 09, 1956 DOA: 04/14/2024  PCP: Elvis Ditch, PA-C  Admit date: 04/14/2024 Discharge date:  04/26/2024  Recommendations for Outpatient Follow-up:  Home health for TPN and wound care (include homehealth, outpatient follow-up instructions, specific recommendations for PCP to follow-up on, etc.)   Discharge Diagnoses:  Principal Problem:   Anastomotic leak of intestine Active Problems:   Protein-calorie malnutrition, severe   Malnutrition of moderate degree   Surgical Procedure: none  Discharge Condition: Good Disposition: Home  Diet recommendation: clear liquids   Hospital Course:  68 yo female presented with succus through wound. She had a CT scan confirming leak with no undrained abscess. She was kept NPO and TPN and antibiotics. She was advanced to clear liquids. Her output from the fistula decreased over time. She was discharged home HD 12.  Discharge Instructions  Discharge Instructions     Diet - low sodium heart healthy   Complete by: As directed    Increase activity slowly   Complete by: As directed       Allergies as of 04/25/2024       Reactions   Codeine    Headache-draws line across forehead        Medication List     STOP taking these medications    predniSONE  5 MG tablet Commonly known as: DELTASONE        TAKE these medications    amiodarone  100 MG tablet Commonly known as: PACERONE  Take 100 mg by mouth at bedtime.   amLODipine  5 MG tablet Commonly known as: NORVASC  Take 5 mg by mouth in the morning and at bedtime.   atorvastatin  80 MG tablet Commonly known as: LIPITOR Take 80 mg by mouth at bedtime.   CALCIUM  CARBONATE-VITAMIN D3 PO Take 600 mg by mouth in the morning and at bedtime.   gabapentin  300 MG capsule Commonly known as: NEURONTIN  Take 300 mg by mouth 3 (three) times daily.   latanoprost  0.005 % ophthalmic solution Commonly known as:  XALATAN  Place 1 drop into both eyes at bedtime.   loperamide 2 MG tablet Commonly known as: Imodium A-D Take 1 tablet (2 mg total) by mouth at bedtime.   metoprolol  succinate 50 MG 24 hr tablet Commonly known as: TOPROL -XL Take 50 mg by mouth 2 (two) times daily.   rivaroxaban  20 MG Tabs tablet Commonly known as: XARELTO  Take 20 mg by mouth daily with supper.   sulfaSALAzine  500 MG tablet Commonly known as: AZULFIDINE  Take 500 mg by mouth 4 (four) times daily.   traMADol  50 MG tablet Commonly known as: ULTRAM  Take 50 mg by mouth every 6 (six) hours as needed for moderate pain (pain score 4-6).          The results of significant diagnostics from this hospitalization (including imaging, microbiology, ancillary and laboratory) are listed below for reference.    Significant Diagnostic Studies: DG CHEST PORT 1 VIEW Result Date: 04/22/2024 CLINICAL DATA:  Leukocytosis. EXAM: PORTABLE CHEST 1 VIEW COMPARISON:  Chest radiograph 04/16/2024 FINDINGS: Right upper extremity PICC tip in the SVC.The cardiomediastinal contours are stable. Aortic atherosclerosis. Subsegmental atelectasis or scarring at the left lung base. Pulmonary vasculature is normal. No consolidation, pleural effusion, or pneumothorax. No acute osseous abnormalities are seen. IMPRESSION: Subsegmental atelectasis or scarring at the left lung base. Electronically Signed   By: Andrea Gasman M.D.   On: 04/22/2024 15:18   CT ABDOMEN PELVIS W CONTRAST Result Date: 04/20/2024 CLINICAL DATA:  Abdominal pain, post-op. EXAM: CT  ABDOMEN AND PELVIS WITH CONTRAST TECHNIQUE: Multidetector CT imaging of the abdomen and pelvis was performed using the standard protocol following bolus administration of intravenous contrast. RADIATION DOSE REDUCTION: This exam was performed according to the departmental dose-optimization program which includes automated exposure control, adjustment of the mA and/or kV according to patient size and/or use  of iterative reconstruction technique. CONTRAST:  OMNIPAQUE  IOHEXOL  300 MG/ML  SOLN COMPARISON:  CT scan abdomen and pelvis from 04/15/2024. FINDINGS: Lower chest: Redemonstration of cavitary approximately 5 x 6 mm nodule in the right lung lower lobe, which is similar to the prior study the lung bases are otherwise clear. No pleural effusion. The heart is normal in size. No pericardial effusion. Hepatobiliary: The liver is normal in size. Non-cirrhotic configuration. No suspicious mass. These is mild diffuse hepatic steatosis. Mild central intrahepatic bile duct dilation noted. There is pneumobilia mainly in the left hepatic lobe, nonspecific but commonly seen post sphincterotomy. Extrahepatic bile duct is mildly dilated measuring up to 13 mm in the proximal portion and 12 mm in the midportion however, it gradually tapers in the distal portion measuring up to 7-8 mm just before the ampulla of Vater. Contracted gallbladder without imaging evidence of acute cholecystitis. Pancreas: Unremarkable. No pancreatic ductal dilatation or surrounding inflammatory changes. Spleen: Within normal limits. No focal lesion. Adrenals/Urinary Tract: Adrenal glands are unremarkable. No suspicious renal mass. There are multiple subcentimeter sized structures throughout bilateral kidneys, which are too small to adequately characterize. There are at least 2, 2 mm sized nonobstructing calculi in the right kidney and at least 1, 2 mm size nonobstructing calculus in the left kidney. No obstructive uropathy or ureterolithiasis on either side. Urinary bladder is under distended, precluding optimal assessment. However, no large mass or stones identified. No perivesical fat stranding. Stomach/Bowel: Patient is status post partial right hemicolectomy with ileocolonic anastomosis in the right mid abdomen. There is also colocolonic anastomosis in the rectosigmoid junction region. Redemonstration of a fistulous tract extending from the left  side of the staple line and extending up to the umbilicus, anteriorly just deep to the abdominal wall muscles (series 5, images 20-28). There is positive contrast within the fistulous tract, compatible with anastomotic leak. There is resultant mild smooth thickening and hyperattenuation of the anterior abdominal wall peritoneal reflection as well as associated trace amount of fluid. There is also secondary thickening of adjacent small bowel loops. Overall, no significant interval change since the prior study. Overlying skin/soft tissue defect noted. There is small amount of fluid within the midline anterior abdominal wall scar, grossly similar to the prior study. No disproportionate dilation of the small or large bowel loops. Vascular/Lymphatic: No ascites or pneumoperitoneum. No abdominal or pelvic lymphadenopathy, by size criteria. No aneurysmal dilation of the major abdominal arteries. There are moderate peripheral atherosclerotic vascular calcifications of the aorta and its major branches. Reproductive: The uterus is surgically absent. No large adnexal mass. Other: The visualized soft tissues and abdominal wall are otherwise unremarkable. Musculoskeletal: No suspicious osseous lesions. There are mild multilevel degenerative changes in the visualized spine. IMPRESSION: 1. Redemonstration of a fistulous tract extending from the left side of the staple line and extending up to the umbilicus, anteriorly just deep to the abdominal wall muscles. There is positive contrast within the fistulous tract, compatible with leak from ileocolic anastomosis. There is resultant mild smooth thickening and hyperattenuation of the anterior abdominal wall peritoneal reflection as well as associated trace amount of fluid. There is also secondary thickening of adjacent small  bowel loops. Overall, no significant interval change since the prior study 2. Multiple other nonacute observations, as described above. Aortic Atherosclerosis  (ICD10-I70.0). Electronically Signed   By: Ree Molt M.D.   On: 04/20/2024 17:47   DG CHEST PORT 1 VIEW Result Date: 04/16/2024 CLINICAL DATA:  Check PICC placement EXAM: PORTABLE CHEST 1 VIEW COMPARISON:  None Available. FINDINGS: Cardiac shadow is enlarged. Aortic calcifications are noted. The lungs are clear bilaterally. Right PICC is noted with the catheter tip in the distal superior vena cava. No other focal abnormality is noted. IMPRESSION: Right PICC in satisfactory position. Electronically Signed   By: Oneil Devonshire M.D.   On: 04/16/2024 19:53   US  EKG SITE RITE Result Date: 04/16/2024 If Site Rite image not attached, placement could not be confirmed due to current cardiac rhythm.  CT ABDOMEN PELVIS W CONTRAST Result Date: 04/15/2024 CLINICAL DATA:  Feculent drainage from prior ileostomy site. Repeat evaluation with oral contrast for evaluation of recurrent anastomotic leak. EXAM: CT ABDOMEN AND PELVIS WITH CONTRAST TECHNIQUE: Multidetector CT imaging of the abdomen and pelvis was performed using the standard protocol following bolus administration of intravenous contrast. RADIATION DOSE REDUCTION: This exam was performed according to the departmental dose-optimization program which includes automated exposure control, adjustment of the mA and/or kV according to patient size and/or use of iterative reconstruction technique. CONTRAST:  OMNIPAQUE  IOHEXOL  300 MG/ML  SOLN COMPARISON:  Earlier same day CT abdomen and pelvis dated 04/15/2024 FINDINGS: Lower chest: No focal consolidation or pulmonary nodule in the lung bases. No pleural effusion or pneumothorax demonstrated. Partially imaged heart size is normal. Hepatobiliary: No focal hepatic lesions. Similar pneumobilia. Gallbladder contains gas and stones. Pancreas: No focal lesions or main ductal dilation. Spleen: Normal in size without focal abnormality. Adrenals/Urinary Tract: No adrenal nodules. No suspicious renal mass, calculi or  hydronephrosis. Punctate nonobstructing bilateral renal stones. Scattered subcentimeter hypodensities, too small to characterize but likely cysts. Excreted contrast material within the urinary bladder. No filling defects. Stomach/Bowel: Normal appearance of the stomach. Gas-containing proximal duodenal diverticula. Enteric contrast material throughout the small bowel, to the level of the rectum. Postsurgical changes of small bowel and ascending and rectosigmoid colon. Mild mural thickening of small bowel loop abutting the anterior peritoneum and gas-containing fluid collection. Colonic diverticulosis without acute diverticulitis. Vascular/Lymphatic: Aortic atherosclerosis. No enlarged abdominal or pelvic lymph nodes. Reproductive: No adnexal masses. Other: Again seen is gas-containing fluid collection within the right lower anterior abdomen extending from the midline surgical defect to the cecal anastomosis, which now contains hyperattenuating contrast material (2: 45-55), contiguous with the distal large bowel anastomosis (5:26). Persistent right lower quadrant mesenteric edema and stranding. Musculoskeletal: No acute or abnormal lytic or blastic osseous lesions. IMPRESSION: 1. Findings consistent with anastomotic leak. 2. Mild mural thickening of small bowel loop abutting the anterior peritoneum and gas-containing fluid collection, likely reactive. 3.  Aortic Atherosclerosis (ICD10-I70.0). Electronically Signed   By: Limin  Xu M.D.   On: 04/15/2024 19:41   CT ABDOMEN PELVIS W CONTRAST Result Date: 04/15/2024 EXAM: CT ABDOMEN AND PELVIS WITH CONTRAST 04/15/2024 01:31:27 AM TECHNIQUE: CT of the abdomen and pelvis was performed with the administration of intravenous contrast, 100 mL of iohexol  (OMNIPAQUE ) 300 MG/ML solution. Multiplanar reformatted images are provided for review. Automated exposure control, iterative reconstruction, and/or weight-based adjustment of the mA/kV was utilized to reduce the radiation  dose to as low as reasonably achievable. COMPARISON: Compared to April 03, 2024. CLINICAL HISTORY: Polytrauma, blunt. Abdominal pain and stoma leakage  started today, 2 weeks s/p surgery for bowel perforation, wbc's 12.2, GFR>60. FINDINGS: LOWER CHEST: No acute abnormality. LIVER: The liver is unremarkable, with scattered areas of pneumobilia within the expected bile duct and nondependent intrahepatic biliary tree, suggesting prior sphincterotomy, which appears unchanged from prior examination. GALLBLADDER AND BILE DUCTS: Cholelithiasis without superimposed pericholecystic inflammatory change. Mild intra and moderate extrahepatic biliary ductal dilation. SPLEEN: No acute abnormality. PANCREAS: No acute abnormality. ADRENAL GLANDS: No acute abnormality. KIDNEYS, URETERS AND BLADDER: Scattered cortical hypodensities are identified within the kidneys bilaterally, which are too small to characterize but likely represent multiple cortical cysts. No follow-up imaging is recommended for these lesions. Punctate nonobstructing calculi are seen within the kidneys bilaterally, measuring 2-3 mm in size. No ureteral calculi. No hydronephrosis. No perinephric inflammatory stranding or fluid collections were identified. The bladder is unremarkable. GI AND BOWEL: Moderate descending and sigmoid colon diverticulosis. Surgical changes of sigmoid and right hemicolectomy are identified. There is a rim-enhancing complex fluid collection dictating non-dependent gas seen within the anterior peritoneum adjacent to the ileocolic anastomosis, measuring 1.4 x 5.4 x 8.2 cm. The fluid appears to track superiorly and may communicate with the midline incision and right mid abdominal ileostomy takedown site. Surgical changes of ventral hernia repair with mesh have been performed, with this complex fluid collection urinary subjacent to and likely extending through the mesh. The terminal small bowel and colon appear hyperemic, fluid-filled, and  mildly dilated, with a small bowel measuring up to 3.9 cm in diameter, suggesting inflammation and an underlying developing ileus. There is mesenteric edema and peritoneal enhancement, which may be postsurgical or inflammatory in nature, as can be seen with developing peritonitis. No evidence of obstruction. PERITONEUM AND RETROPERITONEUM: There is a complex fluid collection with non-dependent gas seen within the anterior peritoneum adjacent to the ileocolic anastomosis, which may communicate with the midline incision and right mid abdominal ileostomy takedown site. VASCULATURE: Right coronary artery calcification. Extent to the other iliac atherosclerotic calcifications. LYMPH NODES: No lymphadenopathy. REPRODUCTIVE ORGANS: Status post hysterectomy. No adnexal masses were seen. BONES AND SOFT TISSUES: Osseous structures are age-appropriate. No acute bone abnormality. No lytic or blastic bone lesion. Mild cardiomegaly. IMPRESSION: 1. Rim-enhancing complex fluid collection with non-dependent gas in the anterior peritoneum adjacent to the ileocolic anastomosis, measuring 1.4 x 5.4 x 8.2 cm, possibly communicating with the midline incision and right mid abdominal ileostomy takedown site. The collection is subjacent to and likely extends through the mesh from prior ventral hernia repair. 2. Hyperemic, fluid-filled, and mildly dilated terminal small bowel and colon, with small bowel measuring up to 3.9 cm in diameter, suggesting inflammation and an underlying developing ileus. No evidence of obstruction. 3. Mesenteric edema and peritoneal enhancement, possibly postsurgical or inflammatory, as can be seen with developing peritonitis. 4. Mild intra- and moderate extrahepatic biliary ductal dilation with pneumobilia, likely related to prior sphincterotomy, unchanged from prior examination. Electronically signed by: Dorethia Molt MD 04/15/2024 01:46 AM EDT RP Workstation: [id number]   US  EKG SITE RITE Result Date:  04/08/2024 If Site Rite image not attached, placement could not be confirmed due to current cardiac rhythm.  DG Abd Portable 1V Result Date: 04/08/2024 CLINICAL DATA:  Ileus. Laparotomy with ileostomy closure 03/29/2024. EXAM: PORTABLE ABDOMEN - 1 VIEW COMPARISON:  01/05/2020 and CT 03/26/2024 FINDINGS: Catheter from below with tip over the left mid abdomen. Surgical suture line over the right lower quadrant. There are several air-filled mildly dilated small bowel loops in the central abdomen measuring up to 4.6 cm  in diameter. Slightly increased number of these dilated bowel loops compared to the recent CT. No free peritoneal air. Contrast within the colon. Remainder of the exam is unchanged. IMPRESSION: Several air-filled mildly dilated small bowel loops in the central abdomen measuring up to 4.6 cm in diameter. Slightly increased number of these dilated bowel loops compared to the recent CT. Findings may be due to persistent postoperative ileus versus small bowel obstruction. Electronically Signed   By: Toribio Agreste M.D.   On: 04/08/2024 10:25   CT ABDOMEN PELVIS WO CONTRAST Result Date: 04/03/2024 CLINICAL DATA:  68 year old female with abdominal pain. History of colostomy reversal with fluid collection and architectural distortion in the peritoneal cavity at the exiting ostomy loop. Follow-up exam with oral contrast. EXAM: CT ABDOMEN AND PELVIS WITHOUT CONTRAST TECHNIQUE: Multidetector CT imaging of the abdomen and pelvis was performed following the standard protocol without IV contrast. RADIATION DOSE REDUCTION: This exam was performed according to the departmental dose-optimization program which includes automated exposure control, adjustment of the mA and/or kV according to patient size and/or use of iterative reconstruction technique. COMPARISON:  CT Abdomen and Pelvis with IV contrast 0159 hours today. FINDINGS: Lower chest: Trace pleural fluid and patchy, irregular lung base opacity unchanged from  earlier. No significant pericardial effusion. Hepatobiliary: Stable pneumobilia. Perihepatic free fluid along the right lobe of the liver appears more hyperdense now, up to 42 Hounsfield units which is an increase of up to 30 Hounsfield units from earlier today. Liver parenchyma appears stable. Gallbladder surgically absent. Pancreas: Stable, negative. Spleen: Stable and diminutive. Adrenals/Urinary Tract: Stable, negative adrenal glands. Nonobstructed kidneys. Diminutive ureters. Oral contrast now within the urinary bladder. Pelvic phleboliths. Stomach/Bowel: Increasingly fluid distended, dilated small bowel loops throughout the abdomen. Oral contrast was administered. The leading edge of oral contrast is in the left abdominal loop seen on coronal image 56. Upstream of that loop dilated fluid containing nonenhancing small bowel loops converge on the ostomy site where there seems to gradual transition to nondilated loops approaching the ostomy. And mild regional architectural distortion better demonstrated on the earlier exam. Stomach and duodenum also demonstrate contrast in the lumen. Multiple duodenal diverticula are identified including that on series 2, image 24. No pneumoperitoneum is identified. Postoperative changes the ventral abdominal wall with mesh. Trace subcutaneous gas near the ostomy loop. The ostomy itself appears completely decompressed. Decompressed large bowel from the distal transverse colon to the rectum with descending diverticulosis. Transverse colon mild redundancy, retained gas and stool. Similar retained gas and stool in the right colon. Unchanged appearance of retrocecal appendix on series 2, image 54. Vascular/Lymphatic: Extensive Aortoiliac calcified atherosclerosis. Normal caliber abdominal aorta. No lymphadenopathy identified. Reproductive: Surgically absent. Other: Pelvic inlet free fluid (series 2, image 71) appears slightly increased in density from earlier (about 10 Hounsfield  units greater now up to 36 Hounsfield units). Musculoskeletal: Stable.  No acute osseous abnormality identified. IMPRESSION: 1. Oral contrast administered with appearance now of Small-bowel Obstruction; oral contrast not reaching the ostomy site and with a gradual transition from dilated to more decompressed loops in the region of ostomy and mesenteric postoperative changes subjacent to the abdominal wall. 2. Small volume of free fluid in the abdomen and pelvis appears increased in density vs CT at 0159 hours today, raising the possibility of bowel leak - although NO Pneumoperitoneum to confirm a leak or perforation. Small volume hemoperitoneum is the main differential consideration. 3. Otherwise stable CT appearance of the abdomen, pelvis, lung bases from earlier today. Aortic  Atherosclerosis (ICD10-I70.0). Electronically Signed   By: VEAR Hurst M.D.   On: 04/03/2024 06:12   CT ABDOMEN PELVIS W CONTRAST Result Date: 04/03/2024 CLINICAL DATA:  Abdominal pain, postop resected colostomy reversal EXAM: CT ABDOMEN AND PELVIS WITH CONTRAST TECHNIQUE: Multidetector CT imaging of the abdomen and pelvis was performed using the standard protocol following bolus administration of intravenous contrast. RADIATION DOSE REDUCTION: This exam was performed according to the departmental dose-optimization program which includes automated exposure control, adjustment of the mA and/or kV according to patient size and/or use of iterative reconstruction technique. CONTRAST:  OMNIPAQUE  IOHEXOL  300 MG/ML  SOLN COMPARISON:  01/22/2024 FINDINGS: Lower chest: Bibasilar atelectasis.  No effusions. Hepatobiliary: Pneumobilia again noted. No focal hepatic abnormality. Pancreas: No focal abnormality or ductal dilatation. Spleen: No focal abnormality.  Normal size. Adrenals/Urinary Tract: Scattered bilateral renal cysts which appear benign. No follow-up imaging recommended. Punctate nonobstructing stone in the midpole of the left kidney. No  hydronephrosis. Adrenal glands and urinary bladder unremarkable. Stomach/Bowel: Stomach is unremarkable. 2.7 cm diverticulum noted laterally off the 1st portion of the duodenum, stable since prior study. Postoperative changes are noted in the right abdomen from cost may reversal. There are dilated fluid-filled small bowel loops in the lower abdomen and into the pelvis. Cannot exclude small bowel obstruction. Large bowel grossly unremarkable. Vascular/Lymphatic: Aortic atherosclerosis. No evidence of aneurysm or adenopathy. Reproductive: Prior hysterectomy.  No adnexal masses. Other: Free fluid in the pelvis and adjacent to the liver. Small irregular fluid collection noted in the right abdomen anteriorly at the postoperative site. This measures approximately 3.1 x 2.6 cm on image 45. Musculoskeletal: No acute bony abnormality. IMPRESSION: Postoperative changes from colostomy reversal. Small fluid collection noted in the anterior abdomen at the prior ostomy level measuring 3.1 x 2.6 cm. This could reflect a small postoperative fluid collection. Early abscess cannot be excluded. Dilated small bowel loops into the pelvis. Terminal ileum is decompressed. Cannot exclude distal small bowel obstruction. Punctate left nephrolithiasis.  No hydronephrosis. Small amount of free fluid in the abdomen and pelvis. Electronically Signed   By: Franky Crease M.D.   On: 04/03/2024 02:24    Labs: Basic Metabolic Panel: Recent Labs  Lab 04/19/24 0105 04/20/24 0823 04/21/24 0038 04/22/24 0324 04/23/24 0417 04/24/24 0213 04/25/24 0235  NA 138 139 139 138 138 139 138  K 3.8 3.7 3.7 3.9 3.9 3.7 4.0  CL 108 109 109 108 105 103 103  CO2 22 22 20* 17* 24 25 25   GLUCOSE 127* 117* 114* 92 107* 106* 108*  BUN 15 16 15 14 20 21 21   CREATININE 0.53 0.63 0.56 0.56 0.53 0.64 0.59  CALCIUM  8.4* 7.8* 8.6* 8.9 8.5* 8.5* 8.6*  MG 1.9  --  2.0  --   --  2.3 2.1  PHOS 3.0  --  2.8  --   --  3.6 3.6   Liver Function Tests: Recent  Labs  Lab 04/21/24 0038 04/25/24 0235  AST 14* 18  ALT 11 15  ALKPHOS 58 56  BILITOT 0.2 0.2  PROT 5.7* 5.9*  ALBUMIN  2.8* 2.8*    CBC: Recent Labs  Lab 04/20/24 0823 04/21/24 0038 04/22/24 0324 04/23/24 0417  WBC 11.5* 12.9* 16.2* 15.6*  HGB 9.3* 9.5* 9.8* 9.2*  HCT 31.2* 32.1* 33.8* 30.0*  MCV 91.5 91.7 92.9 90.1  PLT 823* 778* 686* 610*    CBG: Recent Labs  Lab 04/24/24 1134 04/24/24 1312 04/24/24 1724 04/24/24 2155 04/25/24 0552  GLUCAP 127* 130*  114* 115* 121*    Principal Problem:   Anastomotic leak of intestine Active Problems:   Protein-calorie malnutrition, severe   Malnutrition of moderate degree   Time coordinating discharge: 15 min

## 2024-04-25 NOTE — Plan of Care (Signed)
   Problem: Health Behavior/Discharge Planning: Goal: Ability to manage health-related needs will improve Outcome: Progressing

## 2024-04-25 NOTE — Plan of Care (Signed)
   Problem: Education: Goal: Knowledge of General Education information will improve Description Including pain rating scale, medication(s)/side effects and non-pharmacologic comfort measures Outcome: Progressing   Problem: Health Behavior/Discharge Planning: Goal: Ability to manage health-related needs will improve Outcome: Progressing

## 2024-04-25 NOTE — Consult Note (Addendum)
 WOC Nurse ostomy follow up Surgical team following for assessment and plan of care for abd wound. Please refer to their team for further questions.    Pt had ostomy reversal surgery performed on 9/23 and ileocecectomy performed to the location on 9/29.  She is familiar with pouch application and emptying routines since she was independent with ostomy care prior to this admission.  She states she still has barrier rings and one piece pouches at home for use.  Discussed differences in this location, site is flush with skin level, 1 1/4 inches and oval. No visible stoma.  Emptied 50cc liquid yellow stool.  Applied barrier ring and one piece flexible pouch.  5 sets of supplies to the room for staff nurses' use: use supplies: barrier ring, Lawson # [phone] and one piece flexible pouch Lawson # [phone].  Pt does not need to be placed on Secure Start program.  WOC team will check on the patient weekly while in the hospital.  Thank-you,  Stephane Fought MSN, RN, CWOCN, CWCN-AP, CNS Contact Mon-Fri 0700-1500: [phone]

## 2024-04-26 NOTE — Progress Notes (Addendum)
 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 The output is low so staying away from further enteric nutrition is the best likelihood for healing.  FEN - clear liquids, TPN VTE - lovenox  ID - restart zosyn  Disposition - inpatient

## 2024-04-26 NOTE — Progress Notes (Signed)
 Pt discharged home with daughter in stable condition. DC instructions given. Scripts sent to pharmacy of choice. No immediate questions or  concerns at this time and pt verbalized understanding. DC'd from unit via wheelchair.

## 2024-04-26 NOTE — TOC Transition Note (Signed)
 Transition of Care [facility]) - Discharge Note   Patient Details  Name: Brinna Divelbiss MRN: [id number] Date of Birth: [birth year]  [facility] CM/SW Contact:  Alfonse JONELLE Rex, RN Phone Number: 04/26/2024, 2:38 PM   Clinical Narrative:   DC to home with [facility] TPN and RN, family to transport home. No further TOC needs identified at this time.     Final next level of care: Home w Home Health Services Barriers to Discharge: Barriers Resolved   Patient Goals and CMS Choice Patient states their goals for this hospitalization and ongoing recovery are:: To return home          Discharge Placement                       Discharge Plan and Services Additional resources added to the After Visit Summary for   In-house Referral: Clinical Social Work Discharge Planning Services: NA Post Acute Care Choice: Home Health          DME Arranged: N/A         HH Arranged: RN HH Agency: Other - See comment ([facility]) Date HH Agency Contacted: 04/18/24 Time HH Agency Contacted: [phone]    Social Drivers of Health (SDOH) Interventions SDOH Screenings   Food Insecurity: No Food Insecurity (04/15/2024)  Housing: Low Risk  (04/15/2024)  Transportation Needs: No Transportation Needs (04/15/2024)  Utilities: Not At Risk (04/15/2024)  Depression (PHQ2-9): Low Risk  (12/31/2023)  Social Connections: Moderately Integrated (04/15/2024)  Tobacco Use: Medium Risk (04/14/2024)     Readmission Risk Interventions    04/26/2024    2:38 PM 04/18/2024   10:10 AM 04/15/2024    1:23 PM  Readmission Risk Prevention Plan  Transportation Screening Complete Complete Complete  PCP or Specialist Appt within 3-5 Days  Complete Complete  HRI or Home Care Consult  Complete Complete  Social Work Consult for Recovery Care Planning/Counseling  Complete Complete  Palliative Care Screening  Not Applicable Not Applicable  Medication Review [employer])  Complete Complete Complete  PCP or Specialist appointment within 3-5 days of discharge Complete    HRI or Home Care Consult Complete    SW Recovery Care/Counseling Consult Complete    Palliative Care Screening Not Applicable    Skilled Nursing Facility Not Applicable
